# Patient Record
Sex: Female | Born: 1990 | Race: Black or African American | Hispanic: No | Marital: Single | Smoking: Never smoker
Health system: Southern US, Community
[De-identification: ages and names within clinical notes are randomized; demographics above are authoritative.]

## PROBLEM LIST (undated history)

## (undated) DIAGNOSIS — Z9884 Bariatric surgery status: Secondary | ICD-10-CM

## (undated) DIAGNOSIS — F32A Depression, unspecified: Secondary | ICD-10-CM

## (undated) DIAGNOSIS — G4700 Insomnia, unspecified: Secondary | ICD-10-CM

## (undated) DIAGNOSIS — F419 Anxiety disorder, unspecified: Secondary | ICD-10-CM

## (undated) HISTORY — PX: GASTRIC BYPASS: SHX52

## (undated) HISTORY — PX: CHOLECYSTECTOMY: SHX55

---

## 2019-08-11 ENCOUNTER — Inpatient Hospital Stay (HOSPITAL_COMMUNITY): Payer: Self-pay | Admitting: Anesthesiology

## 2019-08-11 ENCOUNTER — Other Ambulatory Visit: Payer: Self-pay

## 2019-08-11 ENCOUNTER — Encounter (HOSPITAL_COMMUNITY): Admission: EM | Disposition: A | Discharge status: Home / Self Care | Payer: Self-pay | Source: Home / Self Care

## 2019-08-11 ENCOUNTER — Inpatient Hospital Stay (HOSPITAL_COMMUNITY)
Admission: EM | Admit: 2019-08-11 | Discharge: 2019-08-15 | DRG: 358 | Disposition: A | Discharge status: Home / Self Care | Payer: Self-pay | Attending: Surgery | Admitting: Surgery

## 2019-08-11 ENCOUNTER — Encounter (HOSPITAL_COMMUNITY): Payer: Self-pay | Admitting: Emergency Medicine

## 2019-08-11 ENCOUNTER — Emergency Department (HOSPITAL_COMMUNITY): Payer: Self-pay

## 2019-08-11 DIAGNOSIS — G8929 Other chronic pain: Secondary | ICD-10-CM | POA: Diagnosis present

## 2019-08-11 DIAGNOSIS — K567 Ileus, unspecified: Secondary | ICD-10-CM

## 2019-08-11 DIAGNOSIS — R1084 Generalized abdominal pain: Secondary | ICD-10-CM

## 2019-08-11 DIAGNOSIS — R109 Unspecified abdominal pain: Principal | ICD-10-CM | POA: Diagnosis present

## 2019-08-11 DIAGNOSIS — R112 Nausea with vomiting, unspecified: Secondary | ICD-10-CM | POA: Diagnosis present

## 2019-08-11 DIAGNOSIS — Z9884 Bariatric surgery status: Secondary | ICD-10-CM

## 2019-08-11 DIAGNOSIS — Z9049 Acquired absence of other specified parts of digestive tract: Secondary | ICD-10-CM

## 2019-08-11 DIAGNOSIS — Z20828 Contact with and (suspected) exposure to other viral communicable diseases: Secondary | ICD-10-CM | POA: Diagnosis present

## 2019-08-11 HISTORY — PX: LAPAROSCOPY: SHX197

## 2019-08-11 LAB — SURGICAL PCR SCREEN
MRSA, PCR: NEGATIVE
Staphylococcus aureus: POSITIVE — AB

## 2019-08-11 LAB — COMPREHENSIVE METABOLIC PANEL
ALT: 12 U/L (ref 0–44)
AST: 16 U/L (ref 15–41)
Albumin: 4.5 g/dL (ref 3.5–5.0)
Alkaline Phosphatase: 77 U/L (ref 38–126)
Anion gap: 18 — ABNORMAL HIGH (ref 5–15)
BUN: <5 mg/dL — ABNORMAL LOW (ref 6–20)
CO2: 18 mmol/L — ABNORMAL LOW (ref 22–32)
Calcium: 10 mg/dL (ref 8.9–10.3)
Chloride: 102 mmol/L (ref 98–111)
Creatinine, Ser: 0.96 mg/dL (ref 0.44–1.00)
GFR calc Af Amer: >60 mL/min (ref >60)
GFR calc non Af Amer: >60 mL/min (ref >60)
Glucose, Bld: 99 mg/dL (ref 70–99)
Potassium: 4 mmol/L (ref 3.5–5.1)
Sodium: 138 mmol/L (ref 135–145)
Total Bilirubin: 1.3 mg/dL — ABNORMAL HIGH (ref 0.3–1.2)
Total Protein: 8 g/dL (ref 6.5–8.1)

## 2019-08-11 LAB — URINALYSIS, ROUTINE W REFLEX MICROSCOPIC
Bilirubin Urine: NEGATIVE
Glucose, UA: NEGATIVE mg/dL
Hgb urine dipstick: NEGATIVE
Ketones, ur: 80 mg/dL — AB
Leukocytes,Ua: NEGATIVE
Nitrite: NEGATIVE
Protein, ur: 100 mg/dL — AB
Specific Gravity, Urine: 1.029 (ref 1.005–1.030)
pH: 5 (ref 5.0–8.0)

## 2019-08-11 LAB — CBC
HCT: 43.9 % (ref 36.0–46.0)
Hemoglobin: 14.9 g/dL (ref 12.0–15.0)
MCH: 35.2 pg — ABNORMAL HIGH (ref 26.0–34.0)
MCHC: 33.9 g/dL (ref 30.0–36.0)
MCV: 103.8 fL — ABNORMAL HIGH (ref 80.0–100.0)
Platelets: 353 10*3/uL (ref 150–400)
RBC: 4.23 MIL/uL (ref 3.87–5.11)
RDW: 12.9 % (ref 11.5–15.5)
WBC: 15.6 10*3/uL — ABNORMAL HIGH (ref 4.0–10.5)
nRBC: 0 % (ref 0.0–0.2)

## 2019-08-11 LAB — ABO/RH: ABO/RH(D): O POS

## 2019-08-11 LAB — LIPASE, BLOOD: Lipase: 22 U/L (ref 11–51)

## 2019-08-11 LAB — RESPIRATORY PANEL BY RT PCR (FLU A&B, COVID)
Influenza A by PCR: NEGATIVE
Influenza B by PCR: NEGATIVE
SARS Coronavirus 2 by RT PCR: NEGATIVE

## 2019-08-11 LAB — LACTIC ACID, PLASMA
Lactic Acid, Venous: 0.8 mmol/L (ref 0.5–1.9)
Lactic Acid, Venous: 0.9 mmol/L (ref 0.5–1.9)

## 2019-08-11 LAB — I-STAT BETA HCG BLOOD, ED (MC, WL, AP ONLY): I-stat hCG, quantitative: <5 m[IU]/mL (ref <5)

## 2019-08-11 LAB — TYPE AND SCREEN
ABO/RH(D): O POS
Antibody Screen: NEGATIVE

## 2019-08-11 SURGERY — LAPAROSCOPY, DIAGNOSTIC
Anesthesia: General | Site: Abdomen

## 2019-08-11 MED ORDER — LACTATED RINGERS IV SOLN: INTRAVENOUS | Status: DC | PRN

## 2019-08-11 MED ORDER — SODIUM CHLORIDE 0.9% FLUSH
3.0000 mL | Freq: Once | INTRAVENOUS | Status: AC
  Administered 2019-08-11: 3 mL via INTRAVENOUS

## 2019-08-11 MED ORDER — FENTANYL CITRATE (PF) 100 MCG/2ML IJ SOLN
INTRAMUSCULAR | Status: DC | PRN
  Administered 2019-08-11: 50 ug via INTRAVENOUS
  Administered 2019-08-11 (×2): 100 ug via INTRAVENOUS
  Administered 2019-08-11 (×5): 50 ug via INTRAVENOUS

## 2019-08-11 MED ORDER — CHLORHEXIDINE GLUCONATE CLOTH 2 % EX PADS: 6.0000 | MEDICATED_PAD | Freq: Once | CUTANEOUS | Status: AC

## 2019-08-11 MED ORDER — MORPHINE SULFATE (PF) 2 MG/ML IV SOLN
1.0000 mg | INTRAVENOUS | Status: DC | PRN
  Administered 2019-08-11 – 2019-08-15 (×15): 1 mg via INTRAVENOUS
  Filled 2019-08-11 (×15): qty 1

## 2019-08-11 MED ORDER — HYDROMORPHONE HCL 1 MG/ML IJ SOLN
0.2500 mg | INTRAMUSCULAR | Status: DC | PRN
  Administered 2019-08-11 (×4): 0.5 mg via INTRAVENOUS

## 2019-08-11 MED ORDER — PROPOFOL 10 MG/ML IV BOLUS
INTRAVENOUS | Status: DC | PRN
  Administered 2019-08-11: 130 mg via INTRAVENOUS

## 2019-08-11 MED ORDER — ONDANSETRON HCL 4 MG/2ML IJ SOLN
4.0000 mg | Freq: Once | INTRAMUSCULAR | Status: AC
  Administered 2019-08-11: 4 mg via INTRAVENOUS
  Filled 2019-08-11: qty 2

## 2019-08-11 MED ORDER — CHLORHEXIDINE GLUCONATE CLOTH 2 % EX PADS
6.0000 | MEDICATED_PAD | Freq: Once | CUTANEOUS | Status: AC
  Administered 2019-08-11: 6 via TOPICAL

## 2019-08-11 MED ORDER — BUPIVACAINE-EPINEPHRINE 0.5% -1:200000 IJ SOLN
INTRAMUSCULAR | Status: AC
  Filled 2019-08-11: qty 1

## 2019-08-11 MED ORDER — FENTANYL CITRATE (PF) 250 MCG/5ML IJ SOLN
INTRAMUSCULAR | Status: AC
  Filled 2019-08-11: qty 5

## 2019-08-11 MED ORDER — LACTATED RINGERS IV SOLN: INTRAVENOUS | Status: DC

## 2019-08-11 MED ORDER — HYDROMORPHONE HCL 1 MG/ML IJ SOLN
INTRAMUSCULAR | Status: AC
  Filled 2019-08-11: qty 1

## 2019-08-11 MED ORDER — ENOXAPARIN SODIUM 40 MG/0.4ML ~~LOC~~ SOLN
40.0000 mg | SUBCUTANEOUS | Status: DC
  Administered 2019-08-12 – 2019-08-14 (×3): 40 mg via SUBCUTANEOUS
  Filled 2019-08-11 (×3): qty 0.4

## 2019-08-11 MED ORDER — ROCURONIUM BROMIDE 100 MG/10ML IV SOLN
INTRAVENOUS | Status: DC | PRN
  Administered 2019-08-11: 30 mg via INTRAVENOUS
  Administered 2019-08-11: 20 mg via INTRAVENOUS

## 2019-08-11 MED ORDER — PREDNISONE 20 MG PO TABS
20.0000 mg | ORAL_TABLET | Freq: Four times a day (QID) | ORAL | Status: DC | PRN
  Administered 2019-08-12: 20 mg via ORAL
  Filled 2019-08-11: qty 1

## 2019-08-11 MED ORDER — MIDAZOLAM HCL 5 MG/5ML IJ SOLN
INTRAMUSCULAR | Status: DC | PRN
  Administered 2019-08-11: 2 mg via INTRAVENOUS

## 2019-08-11 MED ORDER — ACETAMINOPHEN 650 MG RE SUPP: 650.0000 mg | Freq: Four times a day (QID) | RECTAL | Status: DC | PRN

## 2019-08-11 MED ORDER — SODIUM CHLORIDE 0.9 % IV SOLN
2.0000 g | INTRAVENOUS | Status: AC
  Administered 2019-08-11: 2 g via INTRAVENOUS
  Filled 2019-08-11 (×2): qty 2

## 2019-08-11 MED ORDER — IOHEXOL 300 MG/ML  SOLN
100.0000 mL | Freq: Once | INTRAMUSCULAR | Status: AC | PRN
  Administered 2019-08-11: 100 mL via INTRAVENOUS

## 2019-08-11 MED ORDER — DEXAMETHASONE SODIUM PHOSPHATE 10 MG/ML IJ SOLN
INTRAMUSCULAR | Status: DC | PRN
  Administered 2019-08-11: 10 mg via INTRAVENOUS

## 2019-08-11 MED ORDER — 0.9 % SODIUM CHLORIDE (POUR BTL) OPTIME
TOPICAL | Status: DC | PRN
  Administered 2019-08-11: 1000 mL

## 2019-08-11 MED ORDER — MORPHINE SULFATE (PF) 4 MG/ML IV SOLN
4.0000 mg | Freq: Once | INTRAVENOUS | Status: AC
  Administered 2019-08-11: 4 mg via INTRAVENOUS
  Filled 2019-08-11: qty 1

## 2019-08-11 MED ORDER — SODIUM CHLORIDE 0.9 % IV BOLUS
1000.0000 mL | Freq: Once | INTRAVENOUS | Status: AC
  Administered 2019-08-11: 1000 mL via INTRAVENOUS

## 2019-08-11 MED ORDER — ACETAMINOPHEN 325 MG PO TABS
650.0000 mg | ORAL_TABLET | Freq: Four times a day (QID) | ORAL | Status: DC | PRN
  Administered 2019-08-12: 650 mg via ORAL
  Filled 2019-08-11: qty 2

## 2019-08-11 MED ORDER — PROPOFOL 10 MG/ML IV BOLUS
INTRAVENOUS | Status: AC
  Filled 2019-08-11: qty 20

## 2019-08-11 MED ORDER — SODIUM CHLORIDE 0.9 % IV SOLN: INTRAVENOUS | Status: DC

## 2019-08-11 MED ORDER — ONDANSETRON HCL 4 MG/2ML IJ SOLN
4.0000 mg | Freq: Four times a day (QID) | INTRAMUSCULAR | Status: DC | PRN
  Administered 2019-08-11 – 2019-08-15 (×9): 4 mg via INTRAVENOUS
  Filled 2019-08-11 (×10): qty 2

## 2019-08-11 MED ORDER — ONDANSETRON HCL 4 MG/2ML IJ SOLN
INTRAMUSCULAR | Status: DC | PRN
  Administered 2019-08-11: 4 mg via INTRAVENOUS

## 2019-08-11 MED ORDER — OXYCODONE-ACETAMINOPHEN 5-325 MG PO TABS
1.0000 | ORAL_TABLET | ORAL | Status: DC | PRN
  Administered 2019-08-12 – 2019-08-15 (×6): 1 via ORAL
  Filled 2019-08-11 (×6): qty 1

## 2019-08-11 MED ORDER — SUCCINYLCHOLINE CHLORIDE 20 MG/ML IJ SOLN
INTRAMUSCULAR | Status: DC | PRN
  Administered 2019-08-11: 80 mg via INTRAVENOUS

## 2019-08-11 MED ORDER — STERILE WATER FOR IRRIGATION IR SOLN
Status: DC | PRN
  Administered 2019-08-11: 1000 mL

## 2019-08-11 MED ORDER — ROCURONIUM BROMIDE 10 MG/ML (PF) SYRINGE
PREFILLED_SYRINGE | INTRAVENOUS | Status: AC
  Filled 2019-08-11: qty 10

## 2019-08-11 MED ORDER — SUCRALFATE 1 GM/10ML PO SUSP
1.0000 g | Freq: Three times a day (TID) | ORAL | Status: DC
  Administered 2019-08-11 – 2019-08-15 (×15): 1 g via ORAL
  Filled 2019-08-11 (×15): qty 10

## 2019-08-11 MED ORDER — PANTOPRAZOLE SODIUM 40 MG PO TBEC
40.0000 mg | DELAYED_RELEASE_TABLET | Freq: Every day | ORAL | Status: DC
  Administered 2019-08-12 – 2019-08-15 (×4): 40 mg via ORAL
  Filled 2019-08-11 (×4): qty 1

## 2019-08-11 MED ORDER — ONDANSETRON 4 MG PO TBDP: 4.0000 mg | ORAL_TABLET | Freq: Four times a day (QID) | ORAL | Status: DC | PRN

## 2019-08-11 MED ORDER — BUPIVACAINE-EPINEPHRINE 0.5% -1:200000 IJ SOLN
INTRAMUSCULAR | Status: DC | PRN
  Administered 2019-08-11: 50 mL

## 2019-08-11 MED ORDER — DIPHENHYDRAMINE HCL 50 MG/ML IJ SOLN
INTRAMUSCULAR | Status: AC
  Filled 2019-08-11: qty 1

## 2019-08-11 MED ORDER — MIDAZOLAM HCL 2 MG/2ML IJ SOLN
INTRAMUSCULAR | Status: AC
  Filled 2019-08-11: qty 2

## 2019-08-11 MED ORDER — DIPHENHYDRAMINE HCL 50 MG/ML IJ SOLN
12.5000 mg | Freq: Once | INTRAMUSCULAR | Status: AC
  Administered 2019-08-11: 12.5 mg via INTRAVENOUS

## 2019-08-11 SURGICAL SUPPLY — 60 items
BLADE CLIPPER SURG (BLADE) IMPLANT
CANISTER SUCT 3000ML PPV (MISCELLANEOUS) ×4 IMPLANT
CHLORAPREP W/TINT 26 (MISCELLANEOUS) ×4 IMPLANT
COVER SURGICAL LIGHT HANDLE (MISCELLANEOUS) ×4 IMPLANT
COVER WAND RF STERILE (DRAPES) ×2 IMPLANT
DECANTER SPIKE VIAL GLASS SM (MISCELLANEOUS) ×2 IMPLANT
DERMABOND ADVANCED (GAUZE/BANDAGES/DRESSINGS) ×2
DERMABOND ADVANCED .7 DNX12 (GAUZE/BANDAGES/DRESSINGS) ×2 IMPLANT
DRAPE LAPAROSCOPIC ABDOMINAL (DRAPES) ×2 IMPLANT
DRAPE WARM FLUID 44X44 (DRAPES) ×2 IMPLANT
DRSG OPSITE POSTOP 4X10 (GAUZE/BANDAGES/DRESSINGS) IMPLANT
DRSG OPSITE POSTOP 4X8 (GAUZE/BANDAGES/DRESSINGS) IMPLANT
ELECT BLADE 6.5 EXT (BLADE) IMPLANT
ELECT CAUTERY BLADE 6.4 (BLADE) ×2 IMPLANT
ELECT REM PT RETURN 9FT ADLT (ELECTROSURGICAL) ×4
ELECTRODE REM PT RTRN 9FT ADLT (ELECTROSURGICAL) ×2 IMPLANT
GLOVE BIO SURGEON STRL SZ7 (GLOVE) ×2 IMPLANT
GLOVE BIO SURGEON STRL SZ8 (GLOVE) ×4 IMPLANT
GLOVE BIOGEL PI IND STRL 6.5 (GLOVE) IMPLANT
GLOVE BIOGEL PI IND STRL 7.0 (GLOVE) IMPLANT
GLOVE BIOGEL PI IND STRL 8 (GLOVE) ×2 IMPLANT
GLOVE BIOGEL PI INDICATOR 6.5 (GLOVE) ×2
GLOVE BIOGEL PI INDICATOR 7.0 (GLOVE) ×4
GLOVE BIOGEL PI INDICATOR 8 (GLOVE) ×6
GLOVE SURG SS PI 6.5 STRL IVOR (GLOVE) ×2 IMPLANT
GOWN STRL REUS W/ TWL LRG LVL3 (GOWN DISPOSABLE) ×4 IMPLANT
GOWN STRL REUS W/ TWL XL LVL3 (GOWN DISPOSABLE) ×2 IMPLANT
GOWN STRL REUS W/TWL LRG LVL3 (GOWN DISPOSABLE) ×4
GOWN STRL REUS W/TWL XL LVL3 (GOWN DISPOSABLE) ×2
HANDLE SUCTION POOLE (INSTRUMENTS) ×2 IMPLANT
KIT BASIN OR (CUSTOM PROCEDURE TRAY) ×4 IMPLANT
KIT TURNOVER KIT B (KITS) ×6 IMPLANT
LIGASURE IMPACT 36 18CM CVD LR (INSTRUMENTS) IMPLANT
NS IRRIG 1000ML POUR BTL (IV SOLUTION) ×6 IMPLANT
PACK GENERAL/GYN (CUSTOM PROCEDURE TRAY) ×2 IMPLANT
PAD ARMBOARD 7.5X6 YLW CONV (MISCELLANEOUS) ×4 IMPLANT
PENCIL SMOKE EVACUATOR (MISCELLANEOUS) ×2 IMPLANT
SCISSORS LAP 5X35 DISP (ENDOMECHANICALS) ×2 IMPLANT
SET IRRIG TUBING LAPAROSCOPIC (IRRIGATION / IRRIGATOR) ×2 IMPLANT
SET TUBE SMOKE EVAC HIGH FLOW (TUBING) ×4 IMPLANT
SHEARS HARMONIC ACE PLUS 36CM (ENDOMECHANICALS) IMPLANT
SLEEVE ENDOPATH XCEL 5M (ENDOMECHANICALS) ×6 IMPLANT
SPECIMEN JAR LARGE (MISCELLANEOUS) IMPLANT
SPONGE LAP 18X18 RF (DISPOSABLE) IMPLANT
STAPLER VISISTAT 35W (STAPLE) ×2 IMPLANT
SUCTION POOLE HANDLE (INSTRUMENTS)
SUT MNCRL AB 4-0 PS2 18 (SUTURE) ×4 IMPLANT
SUT PDS AB 1 TP1 96 (SUTURE) ×4 IMPLANT
SUT VIC AB 2-0 SH 18 (SUTURE) ×2 IMPLANT
SUT VIC AB 3-0 SH 18 (SUTURE) ×2 IMPLANT
SUT VICRYL AB 2 0 TIES (SUTURE) ×2 IMPLANT
SUT VICRYL AB 3 0 TIES (SUTURE) ×2 IMPLANT
TOWEL GREEN STERILE (TOWEL DISPOSABLE) ×2 IMPLANT
TOWEL GREEN STERILE FF (TOWEL DISPOSABLE) ×6 IMPLANT
TRAY FOLEY MTR SLVR 16FR STAT (SET/KITS/TRAYS/PACK) ×2 IMPLANT
TRAY LAPAROSCOPIC MC (CUSTOM PROCEDURE TRAY) ×4 IMPLANT
TROCAR XCEL BLUNT TIP 100MML (ENDOMECHANICALS) ×2 IMPLANT
TROCAR XCEL NON-BLD 11X100MML (ENDOMECHANICALS) IMPLANT
TROCAR XCEL NON-BLD 5MMX100MML (ENDOMECHANICALS) ×4 IMPLANT
YANKAUER SUCT BULB TIP NO VENT (SUCTIONS) IMPLANT

## 2019-08-11 NOTE — H&P (Signed)
Marie Myers Apr 05, 1991  [id number].    Requesting MD: Doug SouKaitlyn Albrizze, PA-C Chief Complaint/Reason for Consult: Abdominal pain, n/v  HPI: Marie LevinsOceana Thelin is a 28 y.o. female with a prior abdominal history of cholecystectomy, cesarean and gastric bypass (both performed in [place]) who presented to [facility] today for abdominal pain, nausea and emesis. Patient reports over the last 3 days she has had generalized, constant, aching abdominal pain with associated nausea and emesis. She has had ~20 episodes of nonbilious, nonbloody emesis over the last 24 hours. Her last BM was this morning and was diarrhea. She is not passing flatus. She had an episode of intractable n/v once after her surgery but it resolved and no clear etiology found. She has had serial dilations of GJ anastomosis, at one pointm was going in for this every other week. Last dilation and EGD was 2 years ago. Patient reports hiatal hernia was noted on prior EGD but nothing done about that. Patient was 350 lbs prior to bypass and is now 190 lbs. Has not had any issues in the last year since gallbladder removal. Work-up in the ED revealed WBC 15.6. Afebrile. No tachycardia or hypotension. Lactic acid pending. CT with bypass portion of the stomach mildly distended with fluid of unknown etiology. General surgery was asked to see.  ROS: Review of Systems  Constitutional: Negative for chills and fever.  Respiratory: Negative for cough and shortness of breath.   Cardiovascular: Negative for chest pain.  Gastrointestinal: Positive for abdominal pain, constipation, nausea and vomiting. Negative for diarrhea.  Genitourinary: Negative for dysuria and urgency.  All other systems reviewed and are negative.   No family history on file.  History reviewed. No pertinent past medical history.  Past Surgical History:  Procedure Laterality Date   GASTRIC BYPASS      Social History:  has no history on file for tobacco, alcohol, and  drug.  Allergies: No Known Allergies  (Not in a hospital admission)    Physical Exam: Blood pressure (!) 161/95, pulse 65, temperature 98.5 F (36.9 C), temperature source Oral, resp. rate 15, SpO2 100 %. General: pleasant, WD, WN female who is laying in bed in NAD HEENT: head is normocephalic, atraumatic.  Sclera are noninjected.  PERRL.  Ears and nose without any masses or lesions.  Mouth is dry Heart: regular, rate, and rhythm.  Normal s1,s2. No obvious murmurs, gallops, or rubs noted.  Palpable radial and pedal pulses bilaterally Lungs: CTAB, no wheezes, rhonchi, or rales noted.  Respiratory effort nonlabored Abd: Soft, ND, generalized tenderness without rebound, rigidity, guarding. +BS, no masses, hernias, or organomegaly.  MS: all 4 extremities are symmetrical with no cyanosis, clubbing, or edema. Skin: warm and dry with no masses, lesions, or rashes Psych: A&Ox3 with an appropriate affect. Neuro: Moves all extremities  Results for orders placed or performed during the hospital encounter of 08/11/19 (from the past 48 hour(s))  Lipase, blood     Status: None   Collection Time: 08/11/19 11:46 AM  Result Value Ref Range   Lipase 22 11 - 51 U/L    Comment: Performed at [facility], [address] [place], [place] [address]  Comprehensive metabolic panel     Status: Abnormal   Collection Time: 08/11/19 11:46 AM  Result Value Ref Range   Sodium 138 135 - 145 mmol/L   Potassium 4.0 3.5 - 5.1 mmol/L   Chloride 102 98 - 111 mmol/L   CO2 18 (L) 22 - 32  mmol/L   Glucose, Bld 99 70 - 99 mg/dL   BUN <5 (L) 6 - 20 mg/dL   Creatinine, Ser 3.61 0.44 - 1.00 mg/dL   Calcium 44.3 8.9 - 15.4 mg/dL   Total Protein 8.0 6.5 - 8.1 g/dL   Albumin 4.5 3.5 - 5.0 g/dL   AST 16 15 - 41 U/L   ALT 12 0 - 44 U/L   Alkaline Phosphatase 77 38 - 126 U/L   Total Bilirubin 1.3 (H) 0.3 - 1.2 mg/dL   GFR calc non Af Amer >60 >60 mL/min   GFR calc Af Amer >60 >60 mL/min   Anion gap 18 (H) 5 - 15     Comment: Performed at [facility], [address], [place], [place] [address]  CBC     Status: Abnormal   Collection Time: 08/11/19 11:46 AM  Result Value Ref Range   WBC 15.6 (H) 4.0 - 10.5 K/uL   RBC 4.23 3.87 - 5.11 MIL/uL   Hemoglobin 14.9 12.0 - 15.0 g/dL   HCT 61.9 50.9 - 32.6 %   MCV 103.8 (H) 80.0 - 100.0 fL   MCH 35.2 (H) 26.0 - 34.0 pg   MCHC 33.9 30.0 - 36.0 g/dL   RDW 71.2 45.8 - 09.9 %   Platelets 353 150 - 400 K/uL   nRBC 0.0 0.0 - 0.2 %    Comment: Performed at [facility], [address] [place], [place] [address]  I-Stat beta hCG blood, ED     Status: None   Collection Time: 08/11/19 12:19 PM  Result Value Ref Range   I-stat hCG, quantitative <5.0 <5 mIU/mL   Comment 3            Comment:   GEST. AGE      CONC.  (mIU/mL)   <=1 WEEK        5 - 50     2 WEEKS       50 - 500     3 WEEKS       100 - 10,000     4 WEEKS     1,000 - 30,000        FEMALE AND NON-PREGNANT FEMALE:     LESS THAN 5 mIU/mL   Urinalysis, Routine w reflex microscopic     Status: Abnormal   Collection Time: 08/11/19  1:35 PM  Result Value Ref Range   Color, Urine YELLOW YELLOW   APPearance HAZY (A) CLEAR   Specific Gravity, Urine 1.029 1.005 - 1.030   pH 5.0 5.0 - 8.0   Glucose, UA NEGATIVE NEGATIVE mg/dL   Hgb urine dipstick NEGATIVE NEGATIVE   Bilirubin Urine NEGATIVE NEGATIVE   Ketones, ur 80 (A) NEGATIVE mg/dL   Protein, ur 505 (A) NEGATIVE mg/dL   Nitrite NEGATIVE NEGATIVE   Leukocytes,Ua NEGATIVE NEGATIVE   RBC / HPF 0-5 0 - 5 RBC/hpf   WBC, UA 0-5 0 - 5 WBC/hpf   Bacteria, UA RARE (A) NONE SEEN   Squamous Epithelial / LPF 11-20 0 - 5   Mucus PRESENT     Comment: Performed at [facility], [address] [place], [place] [address]   CT ABDOMEN PELVIS W CONTRAST  Result Date: 08/11/2019 CLINICAL DATA:  Severe abdominal pain.  History of gastric bypass. EXAM: CT ABDOMEN AND PELVIS WITH CONTRAST TECHNIQUE: Multidetector CT imaging of the abdomen and  pelvis was performed using the standard protocol following bolus administration of intravenous contrast.  CONTRAST:  147mL OMNIPAQUE IOHEXOL 300 MG/ML  SOLN COMPARISON:  None. FINDINGS: Lower chest: Lung bases are clear. No effusions. Heart is normal size. Hepatobiliary: Diffuse low-density throughout the liver compatible with fatty infiltration. No focal abnormality. Gallbladder not visualized, presumed prior cholecystectomy Pancreas: No focal abnormality or ductal dilatation. Spleen: No focal abnormality.  Normal size. Adrenals/Urinary Tract: No adrenal abnormality. No focal renal abnormality. No stones or hydronephrosis. Urinary bladder is unremarkable. Stomach/Bowel: Postoperative changes of prior gastric bypass. The bypassed portion of the stomach is mildly distended with fluid. No evidence of bowel obstruction. Small bowel and large bowel unremarkable. Vascular/Lymphatic: No evidence of aneurysm or adenopathy. Reproductive: Uterus and adnexa unremarkable.  No mass. Other: No free fluid or free air. Musculoskeletal: No acute bony abnormality. IMPRESSION: Prior gastric bypass. The bypassed portion of the stomach mildly distended with fluid of unknown etiology or significance. Fatty infiltration of the liver. No acute findings. Electronically Signed   By: Rolm Baptise M.D.   On: 08/11/2019 16:08   Anti-infectives (From admission, onward)   None     Assessment/Plan Hx of gastric bypass Abdominal pain, n/v  - CT scan with bypassed portion of the stomach mildly distended with fluid of unknown etiology or significance - abdominal exam concerning with generalized tenderness, but not peritonitic  - Check lactic acid - Admit to observation, keep NPO - patient may ultimately end up requiring dx laparoscopy later this evening - serial abdominal exam  Brigid Re , [facility] Surgery 08/11/2019, 4:54 PM Please see Amion for pager number during day hours 7:00am-4:30pm

## 2019-08-11 NOTE — Anesthesia Postprocedure Evaluation (Signed)
Anesthesia Post Note  Patient: Marie Myers  Procedure(s) Performed: LAPAROSCOPY DIAGNOSTIC (N/A Abdomen)     Patient location during evaluation: PACU Anesthesia Type: General Level of consciousness: awake and alert Pain management: pain level controlled Vital Signs Assessment: post-procedure vital signs reviewed and stable Respiratory status: spontaneous breathing, nonlabored ventilation and respiratory function stable Cardiovascular status: blood pressure returned to baseline and stable Postop Assessment: no apparent nausea or vomiting Anesthetic complications: no    Last Vitals:  Vitals:   08/11/19 2230 08/11/19 2244  BP: (!) 146/85 (!) 147/84  Pulse: 84 90  Resp: 20 16  Temp: 37.2 C 36.9 C  SpO2: 98% 99%    Last Pain:  Vitals:   08/11/19 2244  TempSrc: Oral  PainSc:                  Josiah Wojtaszek,W. EDMOND

## 2019-08-11 NOTE — ED Notes (Signed)
Attempted to help pt to restroom for urine sample. Pt began feeling lightheaded on way to restroom. Pt helped into hospital bed and taken back to room.

## 2019-08-11 NOTE — Op Note (Signed)
Preoperative diagnosis: History of Roux-en-Y gastric bypass with abdominal pain, nausea and vomiting for 2 days  Postoperative diagnosis: Same   normal anatomy of Roux-en-Y gastric bypass without signs of obstruction, internal hernia or ischemia.  Mild dilatation of gastric remnant and biliopancreatic limb down to the jejunojejunostomy.  Procedure: Diagnostic laparoscopy  Surgeon: Erroll Luna, MD  Anesthesia: General with 0.5% Marcaine plain  EBL: Minimal  Specimen: None  Drains: None  Indications for procedure the patient is a 28 year old female who is 3 years status post Roux-en-Y gastric bypass in [place].  She has had problems on and off since her surgery with nausea, vomiting and intermittent abdominal pain.  The pain worsened over the last 48 hours it became severe.  She presented to the emergency room today.  She had a white count of 16,000 but normal lactate.  CT scan showed dilatation of her gastric remnant but no other significant findings.  On exam she had significant pain to palpation.  I was concerned about a possible internal hernia and recommended diagnostic laparoscopy given her past history.  Risk, benefits and other options in the work-up were discussed with the patient.  She agreed to proceed.The procedure has been discussed with the patient.  Alternative therapies have been discussed with the patient.  Operative risks include bleeding,  Infection,  Organ injury,  Nerve injury,  Blood vessel injury,  DVT,  Pulmonary embolism,  Death,  And possible reoperation.  Medical management risks include worsening of present situation.  The success of the procedure is 50 -90 % at treating patients symptoms.  The patient understands and agrees to proceed.  Description of procedure: The patient was met in the holding area and questions were answered.  She was taken the operating.  She is placed supine upon the OR table.  After induction of general seizure a Foley catheter was placed  under sterile conditions and she was prepped and draped in sterile fashion.  She received 2 g of cefotetan.  Timeout was performed.  A 1 cm infraumbilical incision was made.  This was done with local anesthetic.  Dissection was carried down to her midline and the fascia was opened and the abdominal cavity was entered under direct vision.  A 12 mm Hassan cannula was placed and pneumoperitoneum was created to 15 mmHg of CO2 pressure.  Laparoscopy was performed.  I then placed an additional right lower quadrant 5 mm port, a left lower quadrant 5 mm port, and a right upper quadrant 5 mm port.  This gave me good visualization of the entire abdominal cavity.  I found her gastric pouch and she had an antecolic gastric bypass.  I followed this down from the gastric pouch.  I then found the cecum.  I found the terminal ileum around the small intestine from the terminal ileum all the way up to the jejunojejunostomy.  I then followed the bypass limb up to the gastric remnant.  There is no signs of twisting, obstruction or ischemia.  There is no internal hernia.  At the jejunojejunostomy, there is dilation of the biliopancreatic limb.  This was mild to moderate.  There is no signs of ischemia.  This dilatation stop to the anastomosis raising suspicion for a possible low-grade stenotic issue with the anastomosis.  She had months of chronic pain with nausea and vomiting this could be potential source.  He was not severe and I did not intervene given the mild dilation noted.  Her stomach was normal otherwise.  Her  liver was normal.  The gallbladder was absent.  Her appendix was visualized and was normal.  Pelvic organs were grossly normal.  There is no injury to the bowel with the manipulation.  Spleen appeared normal.  After careful inspection I inspected the rectum, sigmoid colon, descending colon, transverse colon, and ascending colon and saw no abnormality.  At this point time I did not see any complicating feature of her  gastric bypass or obvious source of her abdominal pain.  I elected to remove the ports allowed the CO2 to escape.  The umbilical port site was closed with pursestring suture of 0 Vicryl.  4-0 Monocryl used to close skin incisions.  Dermabond applied.  Foley catheter removed.  All counts were found to be  correct.  Patient was then awoke extubated taken to recovery in satisfactory condition.

## 2019-08-11 NOTE — ED Triage Notes (Signed)
Pt reports hx of gastric bypass, states she began having abd pain and vomiting 3 days. Pt tearful in triage. Denies fevers, or diarrhea.

## 2019-08-11 NOTE — Anesthesia Procedure Notes (Signed)
Procedure Name: Intubation Date/Time: 08/11/2019 8:11 PM Performed by: Purvis Kilts, CRNA Pre-anesthesia Checklist: Patient identified, Emergency Drugs available, Suction available, Patient being monitored and Timeout performed Patient Re-evaluated:Patient Re-evaluated prior to induction Oxygen Delivery Method: Circle system utilized Preoxygenation: Pre-oxygenation with 100% oxygen Induction Type: IV induction Ventilation: Mask ventilation without difficulty Laryngoscope Size: Mac and 3 Grade View: Grade I Tube type: Oral Tube size: 7.0 mm Number of attempts: 1 Airway Equipment and Method: Stylet Placement Confirmation: ETT inserted through vocal cords under direct vision,  positive ETCO2 and breath sounds checked- equal and bilateral Secured at: 21 cm Tube secured with: Tape Dental Injury: Teeth and Oropharynx as per pre-operative assessment

## 2019-08-11 NOTE — ED Notes (Addendum)
ED TO INPATIENT HANDOFF REPORT  ED Nurse Name and Phone #: Alycia RossettiRyan, [phone]  S Name/Age/Gender Marie Myers 28 y.o. female Room/Bed: 004C/004C  Code Status   Code Status: Full Code  Home/SNF/Other Home Patient oriented to: self, place, time and situation Is this baseline? Yes   Triage Complete: Triage complete  Chief Complaint Abdominal pain [R10.9]  Triage Note Pt reports hx of gastric bypass, states she began having abd pain and vomiting 3 days. Pt tearful in triage. Denies fevers, or diarrhea.     Allergies No Known Allergies  Level of Care/Admitting Diagnosis ED Disposition    ED Disposition Condition Comment   Admit  [facility]ospital [facility] [[id number]]  Level of Care: Med-Surg [16]  Covid Evaluation: Asymptomatic Screening Protocol (No Symptoms)  Diagnosis: Abdominal pain [454098][[id number]]  Admitting Physician: CCS, MD [[id number]]  Attending Physician: CCS, MD [[id number]]  Estimated length of stay: past midnight tomorrow  Certification:: I certify this patient will need inpatient services for at least 2 midnights       B Medical/Surgery History History reviewed. No pertinent past medical history. Past Surgical History:  Procedure Laterality Date  . GASTRIC BYPASS       A IV Location/Drains/Wounds Patient Lines/Drains/Airways Status   Active Line/Drains/Airways    Name:   Placement date:   Placement time:   Site:   Days:   Peripheral IV 08/11/19 Right Antecubital   08/11/19    1326    Antecubital   less than 1          Intake/Output Last 24 hours  Intake/Output Summary (Last 24 hours) at 08/11/2019 1735 Last data filed at 08/11/2019 1710 Gross per 24 hour  Intake 1000 ml  Output --  Net 1000 ml    Labs/Imaging Results for orders placed or performed during the hospital encounter of 08/11/19 (from the past 48 hour(s))  Lipase, blood     Status: None   Collection Time: 08/11/19 11:46 AM  Result Value Ref Range   Lipase 22 11 - 51 U/L     Comment: Performed at [facility], [address] [place], [place] [address]  Comprehensive metabolic panel     Status: Abnormal   Collection Time: 08/11/19 11:46 AM  Result Value Ref Range   Sodium 138 135 - 145 mmol/L   Potassium 4.0 3.5 - 5.1 mmol/L   Chloride 102 98 - 111 mmol/L   CO2 18 (L) 22 - 32 mmol/L   Glucose, Bld 99 70 - 99 mg/dL   BUN <5 (L) 6 - 20 mg/dL   Creatinine, Ser 7.820.96 0.44 - 1.00 mg/dL   Calcium 95.610.0 8.9 - 21.310.3 mg/dL   Total Protein 8.0 6.5 - 8.1 g/dL   Albumin 4.5 3.5 - 5.0 g/dL   AST 16 15 - 41 U/L   ALT 12 0 - 44 U/L   Alkaline Phosphatase 77 38 - 126 U/L   Total Bilirubin 1.3 (H) 0.3 - 1.2 mg/dL   GFR calc non Af Amer >60 >60 mL/min   GFR calc Af Amer >60 >60 mL/min   Anion gap 18 (H) 5 - 15    Comment: Performed at [facility], [address] [place], [place] [address]  CBC     Status: Abnormal   Collection Time: 08/11/19 11:46 AM  Result Value Ref Range   WBC 15.6 (H) 4.0 - 10.5 K/uL   RBC 4.23 3.87 - 5.11 MIL/uL   Hemoglobin 14.9 12.0 - 15.0  g/dL   HCT 51.7 61.6 - 07.3 %   MCV 103.8 (H) 80.0 - 100.0 fL   MCH 35.2 (H) 26.0 - 34.0 pg   MCHC 33.9 30.0 - 36.0 g/dL   RDW 71.0 62.6 - 94.8 %   Platelets 353 150 - 400 K/uL   nRBC 0.0 0.0 - 0.2 %    Comment: Performed at [facility], [address] [place], [place] [address]  I-Stat beta hCG blood, ED     Status: None   Collection Time: 08/11/19 12:19 PM  Result Value Ref Range   I-stat hCG, quantitative <5.0 <5 mIU/mL   Comment 3            Comment:   GEST. AGE      CONC.  (mIU/mL)   <=1 WEEK        5 - 50     2 WEEKS       50 - 500     3 WEEKS       100 - 10,000     4 WEEKS     1,000 - 30,000        FEMALE AND NON-PREGNANT FEMALE:     LESS THAN 5 mIU/mL   Urinalysis, Routine w reflex microscopic     Status: Abnormal   Collection Time: 08/11/19  1:35 PM  Result Value Ref Range   Color, Urine YELLOW YELLOW   APPearance HAZY (A) CLEAR   Specific Gravity, Urine 1.029 1.005  - 1.030   pH 5.0 5.0 - 8.0   Glucose, UA NEGATIVE NEGATIVE mg/dL   Hgb urine dipstick NEGATIVE NEGATIVE   Bilirubin Urine NEGATIVE NEGATIVE   Ketones, ur 80 (A) NEGATIVE mg/dL   Protein, ur 035 (A) NEGATIVE mg/dL   Nitrite NEGATIVE NEGATIVE   Leukocytes,Ua NEGATIVE NEGATIVE   RBC / HPF 0-5 0 - 5 RBC/hpf   WBC, UA 0-5 0 - 5 WBC/hpf   Bacteria, UA RARE (A) NONE SEEN   Squamous Epithelial / LPF 11-20 0 - 5   Mucus PRESENT     Comment: Performed at [facility], [address], [place], [place] [address]   CT ABDOMEN PELVIS W CONTRAST  Result Date: 08/11/2019 CLINICAL DATA:  Severe abdominal pain.  History of gastric bypass. EXAM: CT ABDOMEN AND PELVIS WITH CONTRAST TECHNIQUE: Multidetector CT imaging of the abdomen and pelvis was performed using the standard protocol following bolus administration of intravenous contrast. CONTRAST:  OMNIPAQUE IOHEXOL 300 MG/ML  SOLN COMPARISON:  None. FINDINGS: Lower chest: Lung bases are clear. No effusions. Heart is normal size. Hepatobiliary: Diffuse low-density throughout the liver compatible with fatty infiltration. No focal abnormality. Gallbladder not visualized, presumed prior cholecystectomy Pancreas: No focal abnormality or ductal dilatation. Spleen: No focal abnormality.  Normal size. Adrenals/Urinary Tract: No adrenal abnormality. No focal renal abnormality. No stones or hydronephrosis. Urinary bladder is unremarkable. Stomach/Bowel: Postoperative changes of prior gastric bypass. The bypassed portion of the stomach is mildly distended with fluid. No evidence of bowel obstruction. Small bowel and large bowel unremarkable. Vascular/Lymphatic: No evidence of aneurysm or adenopathy. Reproductive: Uterus and adnexa unremarkable.  No mass. Other: No free fluid or free air. Musculoskeletal: No acute bony abnormality. IMPRESSION: Prior gastric bypass. The bypassed portion of the stomach mildly distended with fluid of unknown etiology or significance.  Fatty infiltration of the liver. No acute findings. Electronically Signed   By: Charlett Nose M.D.   On: 08/11/2019 16:08    Pending Labs  Unresulted Labs (From admission, onward)    Start     Ordered   08/18/19 0500  Creatinine, serum  (enoxaparin (LOVENOX)    CrCl >/= 30 ml/min)  Weekly,   R    Comments: while on enoxaparin therapy    08/11/19 1700   08/12/19 0500  HIV Antibody (routine testing w rflx)  (HIV Antibody (Routine testing w reflex) panel)  Tomorrow morning,   R     08/11/19 1700   08/12/19 1751  Basic metabolic panel  Tomorrow morning,   R     08/11/19 1700   08/12/19 0500  CBC  Tomorrow morning,   R     08/11/19 1700   08/11/19 1650  Respiratory Panel by RT PCR (Flu A&B, Covid) - Nasopharyngeal Swab  (Tier 2 Respiratory Panel by RT PCR (Flu A&B, Covid) (TAT 2 hrs))  Once,   STAT    Question Answer Comment  Is this test for diagnosis or screening Screening   Symptomatic for COVID-19 as defined by CDC No   Hospitalized for COVID-19 No   Admitted to ICU for COVID-19 No   Previously tested for COVID-19 No   Resident in a congregate (group) care setting No   Employed in healthcare setting No   Pregnant No      08/11/19 1649   08/11/19 1621  Lactic acid, plasma  STAT Now then every 3 hours,   R     08/11/19 1620          Vitals/Pain Today's Vitals   08/11/19 1141 08/11/19 1143  BP:  (!) 161/95  Pulse:  65  Resp:  15  Temp:  98.5 F (36.9 C)  TempSrc:  Oral  SpO2:  100%  PainSc: 10-Worst pain ever     Isolation Precautions No active isolations  Medications Medications  enoxaparin (LOVENOX) injection 40 mg (has no administration in time range)  0.9 %  sodium chloride infusion (has no administration in time range)  acetaminophen (TYLENOL) tablet 650 mg (has no administration in time range)    Or  acetaminophen (TYLENOL) suppository 650 mg (has no administration in time range)  morphine 2 MG/ML injection 1 mg (has no administration in time range)   ondansetron (ZOFRAN-ODT) disintegrating tablet 4 mg (has no administration in time range)    Or  ondansetron (ZOFRAN) injection 4 mg (has no administration in time range)  sodium chloride flush (NS) 0.9 % injection 3 mL (3 mLs Intravenous Given 08/11/19 1332)  sodium chloride 0.9 % bolus 1,000 mL (0 mLs Intravenous Stopped 08/11/19 1710)  morphine 4 MG/ML injection 4 mg (4 mg Intravenous Given 08/11/19 1331)  ondansetron (ZOFRAN) injection 4 mg (4 mg Intravenous Given 08/11/19 1330)  iohexol (OMNIPAQUE) 300 MG/ML solution 100 mL (100 mLs Intravenous Contrast Given 08/11/19 1551)    Mobility walks Low fall risk   Focused Assessments Pulmonary Assessment Handoff:        R Recommendations: See Admitting Provider Note  Report given to: Wynn Maudlin RN  Additional Notes:

## 2019-08-11 NOTE — Progress Notes (Signed)
Pt seen examined and agree with previous  evaluation Significant abdominal pain with N/V  And leukocytosis  Recommend diagnostic laparoscopy to evaluate for internal hernia  Or other potential cause  Pt understands and agrees to proceed    The procedure has been discussed with the patient.  Alternative therapies have been discussed with the patient.  Operative risks include bleeding,  Infection,  Organ injury,  Nerve injury,  Blood vessel injury,  DVT,  Pulmonary embolism,  Death,  And possible reoperation.  Medical management risks include worsening of present situation.  The success of the procedure is 50 -90 % at treating patients symptoms.  The patient understands and agrees to proceed.

## 2019-08-11 NOTE — Progress Notes (Signed)
Received patient from ED. Patient alert and oriented x4. Complaining of abdominal pain and nausea. PRN will be administered. Patient standby assist from wheelchair to bed. Made comfortable in bed. Call bell within reach. Will continue to monitor.

## 2019-08-11 NOTE — ED Provider Notes (Signed)
[facility] Provider Note   CSN: [id number] Arrival date & time: 08/11/19  1136     History Chief Complaint  Patient presents with  . Emesis    [name] is a 28 y.o. female with past medical history significant for roux and y gastric bypass 2013 presents to emergency department today with chief complaint of generalized abdominal pain and emesis x 3 days. Pain has been constant.  She describes it as aching sensation.  She rates the pain 10 out of 10 in severity.  She is unable to tolerate p.o. intake.  She admits to nausea with over 20 episodes of nonbloody nonbilious emesis in the last 24 hours.  Her last bowel movement was this morning and it was normal for her.  She states she has a history of constipation and typically has 1 bowel movement a week.  She denies any diarrhea or blood in stool.  Her LMP was x1 month ago and is due to start tomorrow. When asked if she could be pregnant she says "unsure." She admits to passing flatus.  She denies fever, chills, chest pain, gross hematuria, urinary frequency, dysuria, vaginal discharge, pelvic pain, back pain.  Denies history of kidney stones.  Abdominal surgical history includes gastric bypass as well [place]  History reviewed. No pertinent past medical history.  There are no problems to display for this patient.   Past Surgical History:  Procedure Laterality Date  . GASTRIC BYPASS       OB History   No obstetric history on file.     No family history on file.  Social History   Tobacco Use  . Smoking status: Not on file  Substance Use Topics  . Alcohol use: Not on file  . Drug use: Not on file    Home Medications Prior to Admission medications   Not on File    Allergies    Patient has no known allergies.  Review of Systems   Review of Systems  All other systems are reviewed and are negative for acute change except as noted in the  HPI.   Physical Exam Updated Vital Signs BP (!) 161/95   Pulse 65   Temp 98.5 F (36.9 C) (Oral)   Resp 15   SpO2 100%   Physical Exam Vitals and nursing note reviewed.  Constitutional:      General: She is in acute distress.     Appearance: She is diaphoretic. She is not toxic-appearing.  HENT:     Head: Normocephalic and atraumatic.     Right Ear: Tympanic membrane and external ear normal.     Left Ear: Tympanic membrane and external ear normal.     Nose: Nose normal.     Mouth/Throat:     Mouth: Mucous membranes are moist.     Pharynx: Oropharynx is clear.  Eyes:     General: No scleral icterus.       Right eye: No discharge.        Left eye: No discharge.     Extraocular Movements: Extraocular movements intact.     Conjunctiva/sclera: Conjunctivae normal.     Pupils: Pupils are equal, round, and reactive to light.  Neck:     Vascular: No JVD.  Cardiovascular:     Rate and Rhythm: Normal rate and regular rhythm.     Pulses: Normal pulses.          Radial pulses are 2+ on  the right side and 2+ on the left side.     Heart sounds: Normal heart sounds.  Pulmonary:     Comments: Lungs clear to auscultation in all fields. Symmetric chest rise. No wheezing, rales, or rhonchi. Abdominal:     Tenderness: There is no right CVA tenderness or left CVA tenderness.     Comments: Abdomen is soft, non-distended. Generalized  tenderness with voluntary guarding.  No rigidity. No peritoneal signs. Normoactive bowel sounds.  Musculoskeletal:        General: Normal range of motion.     Cervical back: Normal range of motion.  Skin:    General: Skin is warm.     Capillary Refill: Capillary refill takes less than 2 seconds.  Neurological:     Mental Status: She is oriented to person, place, and time.     GCS: GCS eye subscore is 4. GCS verbal subscore is 5. GCS motor subscore is 6.     Comments: Fluent speech, no facial droop.  Psychiatric:        Behavior: Behavior normal.         ED Results / Procedures / Treatments   Labs (all labs ordered are listed, but only abnormal results are displayed) Labs Reviewed  COMPREHENSIVE METABOLIC PANEL - Abnormal; Notable for the following components:      Result Value   CO2 18 (*)    BUN <5 (*)    Total Bilirubin 1.3 (*)    Anion gap 18 (*)    All other components within normal limits  CBC - Abnormal; Notable for the following components:   WBC 15.6 (*)    MCV 103.8 (*)    MCH 35.2 (*)    All other components within normal limits  URINALYSIS, ROUTINE W REFLEX MICROSCOPIC - Abnormal; Notable for the following components:   APPearance HAZY (*)    Ketones, ur 80 (*)    Protein, ur 100 (*)    Bacteria, UA RARE (*)    All other components within normal limits  LIPASE, BLOOD  LACTIC ACID, PLASMA  LACTIC ACID, PLASMA  I-STAT BETA HCG BLOOD, ED ([facility], [facility], [facility] ONLY)    EKG None  Radiology CT ABDOMEN PELVIS W CONTRAST  Result Date: 08/11/2019 CLINICAL DATA:  Severe abdominal pain.  History of gastric bypass. EXAM: CT ABDOMEN AND PELVIS WITH CONTRAST TECHNIQUE: Multidetector CT imaging of the abdomen and pelvis was performed using the standard protocol following bolus administration of intravenous contrast. CONTRAST:  170mL OMNIPAQUE IOHEXOL 300 MG/ML  SOLN COMPARISON:  None. FINDINGS: Lower chest: Lung bases are clear. No effusions. Heart is normal size. Hepatobiliary: Diffuse low-density throughout the liver compatible with fatty infiltration. No focal abnormality. Gallbladder not visualized, presumed prior cholecystectomy Pancreas: No focal abnormality or ductal dilatation. Spleen: No focal abnormality.  Normal size. Adrenals/Urinary Tract: No adrenal abnormality. No focal renal abnormality. No stones or hydronephrosis. Urinary bladder is unremarkable. Stomach/Bowel: Postoperative changes of prior gastric bypass. The bypassed portion of the stomach is mildly distended with fluid. No evidence of bowel obstruction. Small bowel  and large bowel unremarkable. Vascular/Lymphatic: No evidence of aneurysm or adenopathy. Reproductive: Uterus and adnexa unremarkable.  No mass. Other: No free fluid or free air. Musculoskeletal: No acute bony abnormality. IMPRESSION: Prior gastric bypass. The bypassed portion of the stomach mildly distended with fluid of unknown etiology or significance. Fatty infiltration of the liver. No acute findings. Electronically Signed   By: Rolm Baptise M.D.   On: 08/11/2019 16:08  Procedures Procedures (including critical care time)  Medications Ordered in ED Medications  sodium chloride flush (NS) 0.9 % injection 3 mL (3 mLs Intravenous Given 08/11/19 1332)  sodium chloride 0.9 % bolus 1,000 mL (1,000 mLs Intravenous New Bag/Given 08/11/19 1332)  morphine 4 MG/ML injection 4 mg (4 mg Intravenous Given 08/11/19 1331)  ondansetron (ZOFRAN) injection 4 mg (4 mg Intravenous Given 08/11/19 1330)  iohexol (OMNIPAQUE) 300 MG/ML solution 100 mL (100 mLs Intravenous Contrast Given 08/11/19 1551)    ED Course  I have reviewed the triage vital signs and the nursing notes.  Pertinent labs & imaging results that were available during my care of the patient were reviewed by me and considered in my medical decision making (see chart for details).  Clinical Course as of Aug 11 1639  Fri Aug 11, 2019  1233 Pregnancy test is negative  I-stat hCG, quantitative: <5.0 [KA]  1347 Leukocytosis 15.6, stable hemoglobin 14.9  CBC(!) [KA]  1349 Elevated anion gap of 18, bicarb of 18, likely dehydration as patient has had limited p.o. intake and > 20 episodes of emesis.Total bilirubin only slightly elevated at 1.3.  No severe electrolyte derangement.  BUN is low, normal creatinine.  Comprehensive metabolic panel(!) [KA]  1400 Lipase within normal range  Lipase: 22 [KA]  1400 UA without signs of infection  Urinalysis, Routine w reflex microscopic(!) [KA]    Clinical Course User Index [KA] Xai Frerking, Caroleen Hamman, PA-C    MDM Rules/Calculators/A&P                      Patient presents to the ED with complaints of abdominal pain. Patient nontoxic appearing, however appears very uncomfortable.  Vitals WNL.  Vitals are not concerning for sepsis. On exam patient with generalized tenderness, no peritoneal signs. She is diaphoretic. Will evaluate with labs and CT A/P. Analgesics, anti-emetics, and fluids administered.  DDx includes gastritis, SBO, appendicitis, appendicitis, diverticulitis, colitis, ectopic pregnancy.  Given her surgical history there is heightened concern for SBO.  Significant lab findings as documented above in ED course.  Pregnancy test is negative. On reassessment she continues to have abdominal pain, states it has only slightly improved after morphine.  She still feels nauseous. Imaging viewed by me shows bypassed portion of the stomach is mildly distended with fluid. Radiologist comments fluids is of unknown etiology or significance. This case was discussed with Dr. Silverio Lay who has seen the patient and agrees with plan to admit.  Case discussed with on call surgical PA Tresa Endo [place]. She and surgical attending Dr. Dwain Sarna evaluated patient and will admit to the hospital for possible diagnostic laparoscopy tonight.  Patient is stable at time of disposition.   Portions of this note were generated with Scientist, clinical (histocompatibility and immunogenetics). Dictation errors may occur despite best attempts at proofreading.   Final Clinical Impression(s) / ED Diagnoses Final diagnoses:  Generalized abdominal pain    Rx / DC Orders ED Discharge Orders    None       Sherene Sires, PA-C 08/11/19 1715    Gwyneth Sprout, MD 08/12/19 [phone]

## 2019-08-11 NOTE — Transfer of Care (Signed)
Immediate Anesthesia Transfer of Care Note  Patient: Marie Myers  Procedure(s) Performed: LAPAROSCOPY DIAGNOSTIC (N/A ) Poss EXPLORATORY LAPAROTOMY (N/A )  Patient Location: PACU  Anesthesia Type:General  Level of Consciousness: awake  Airway & Oxygen Therapy: Patient Spontanous Breathing  Post-op Assessment: Report given to RN and Post -op Vital signs reviewed and stable  Post vital signs: Reviewed and stable  Last Vitals:  Vitals Value Taken Time  BP 152/97 08/11/19 2148  Temp    Pulse 86 08/11/19 2151  Resp 14 08/11/19 2151  SpO2 99 % 08/11/19 2151  Vitals shown include unvalidated device data.  Last Pain:  Vitals:   08/11/19 1841  TempSrc:   PainSc: 8          Complications: No apparent anesthesia complications

## 2019-08-11 NOTE — Anesthesia Preprocedure Evaluation (Addendum)
Anesthesia Evaluation  Patient identified by MRN, date of birth, ID band Patient awake    Reviewed: Allergy & Precautions, H&P , NPO status , Patient's Chart, lab work & pertinent test results  Airway Mallampati: I  TM Distance: >3 FB Neck ROM: Full    Dental no notable dental hx. (+) Teeth Intact, Dental Advisory Given   Pulmonary neg pulmonary ROS,    Pulmonary exam normal breath sounds clear to auscultation       Cardiovascular negative cardio ROS   Rhythm:Regular Rate:Normal     Neuro/Psych negative neurological ROS  negative psych ROS   GI/Hepatic negative GI ROS, Neg liver ROS,   Endo/Other  negative endocrine ROS  Renal/GU negative Renal ROS  negative genitourinary   Musculoskeletal   Abdominal   Peds  Hematology negative hematology ROS (+)   Anesthesia Other Findings   Reproductive/Obstetrics negative OB ROS                            Anesthesia Physical Anesthesia Plan  ASA: I  Anesthesia Plan: General   Post-op Pain Management:    Induction: Intravenous, Rapid sequence and Cricoid pressure planned  PONV Risk Score and Plan: 4 or greater and Ondansetron, Dexamethasone and Midazolam  Airway Management Planned: Oral ETT  Additional Equipment:   Intra-op Plan:   Post-operative Plan: Extubation in OR  Informed Consent: I have reviewed the patients History and Physical, chart, labs and discussed the procedure including the risks, benefits and alternatives for the proposed anesthesia with the patient or authorized representative who has indicated his/her understanding and acceptance.     Dental advisory given  Plan Discussed with: CRNA  Anesthesia Plan Comments:        Anesthesia Quick Evaluation  

## 2019-08-12 LAB — HIV ANTIBODY (ROUTINE TESTING W REFLEX): HIV Screen 4th Generation wRfx: NONREACTIVE

## 2019-08-12 LAB — CBC
HCT: 38.3 % (ref 36.0–46.0)
Hemoglobin: 12.9 g/dL (ref 12.0–15.0)
MCH: 35.4 pg — ABNORMAL HIGH (ref 26.0–34.0)
MCHC: 33.7 g/dL (ref 30.0–36.0)
MCV: 105.2 fL — ABNORMAL HIGH (ref 80.0–100.0)
Platelets: 283 10*3/uL (ref 150–400)
RBC: 3.64 MIL/uL — ABNORMAL LOW (ref 3.87–5.11)
RDW: 12.9 % (ref 11.5–15.5)
WBC: 18 10*3/uL — ABNORMAL HIGH (ref 4.0–10.5)
nRBC: 0 % (ref 0.0–0.2)

## 2019-08-12 LAB — BASIC METABOLIC PANEL
Anion gap: 11 (ref 5–15)
BUN: 5 mg/dL — ABNORMAL LOW (ref 6–20)
CO2: 22 mmol/L (ref 22–32)
Calcium: 8.9 mg/dL (ref 8.9–10.3)
Chloride: 102 mmol/L (ref 98–111)
Creatinine, Ser: 0.79 mg/dL (ref 0.44–1.00)
GFR calc Af Amer: >60 mL/min (ref >60)
GFR calc non Af Amer: >60 mL/min (ref >60)
Glucose, Bld: 154 mg/dL — ABNORMAL HIGH (ref 70–99)
Potassium: 3.7 mmol/L (ref 3.5–5.1)
Sodium: 135 mmol/L (ref 135–145)

## 2019-08-12 MED ORDER — KETOROLAC TROMETHAMINE 30 MG/ML IJ SOLN
30.0000 mg | Freq: Three times a day (TID) | INTRAMUSCULAR | Status: AC | PRN
  Filled 2019-08-12 (×2): qty 1

## 2019-08-12 MED ORDER — MUPIROCIN 2 % EX OINT
1.0000 "application " | TOPICAL_OINTMENT | Freq: Two times a day (BID) | CUTANEOUS | Status: DC
  Administered 2019-08-12 – 2019-08-15 (×8): 1 via NASAL
  Filled 2019-08-12: qty 22

## 2019-08-12 MED ORDER — CHLORHEXIDINE GLUCONATE CLOTH 2 % EX PADS
6.0000 | MEDICATED_PAD | Freq: Every day | CUTANEOUS | Status: DC
  Administered 2019-08-12 – 2019-08-15 (×4): 6 via TOPICAL

## 2019-08-12 MED ORDER — ZOLPIDEM TARTRATE 5 MG PO TABS
5.0000 mg | ORAL_TABLET | Freq: Every evening | ORAL | Status: AC | PRN
  Administered 2019-08-13: 5 mg via ORAL
  Filled 2019-08-12: qty 1

## 2019-08-12 MED ORDER — METHOCARBAMOL 1000 MG/10ML IJ SOLN
500.0000 mg | Freq: Four times a day (QID) | INTRAVENOUS | Status: DC | PRN
  Administered 2019-08-12 – 2019-08-14 (×4): 500 mg via INTRAVENOUS
  Filled 2019-08-12: qty 5
  Filled 2019-08-12: qty 500
  Filled 2019-08-12 (×3): qty 5

## 2019-08-12 NOTE — Progress Notes (Signed)
  1 Day Post-Op  Subjective: Complains of pain, worse than before surgery. No vomiting  Objective: Vital signs in last 24 hours: Temp:  [97.6 F (36.4 C)-99.9 F (37.7 C)] 99.1 F (37.3 C) (12/19 0747) Pulse Rate:  [65-104] 69 (12/19 0747) Resp:  [13-20] 19 (12/19 0747) BP: (122-161)/(82-98) 122/82 (12/19 0747) SpO2:  [98 %-100 %] 100 % (12/19 0747) Last BM Date: 08/11/19  Intake/Output from previous day: 12/18 0701 - 12/19 0700 In: 2914.7 [P.O.:315; I.V.:1599.7; IV Piggyback:1000] Out: 35 [Urine:30; Blood:5] Intake/Output this shift: No intake/output data recorded.  General appearance: alert and cooperative Resp: clear to auscultation bilaterally Cardio: regular rate and rhythm GI: soft, diffuse tenderness out of proportion to physical findings  Lab Results:  Recent Labs    08/11/19 1146 08/12/19 0339  WBC 15.6* 18.0*  HGB 14.9 12.9  HCT 43.9 38.3  PLT 353 283   BMET Recent Labs    08/11/19 1146 08/12/19 0339  NA 138 135  K 4.0 3.7  CL 102 102  CO2 18* 22  GLUCOSE 99 154*  BUN <5* 5*  CREATININE 0.96 0.79  CALCIUM 10.0 8.9   PT/INR No results for input(s): LABPROT, INR in the last 72 hours. ABG No results for input(s): PHART, HCO3 in the last 72 hours.  Invalid input(s): PCO2, PO2  Studies/Results: CT ABDOMEN PELVIS W CONTRAST  Result Date: 08/11/2019 CLINICAL DATA:  Severe abdominal pain.  History of gastric bypass. EXAM: CT ABDOMEN AND PELVIS WITH CONTRAST TECHNIQUE: Multidetector CT imaging of the abdomen and pelvis was performed using the standard protocol following bolus administration of intravenous contrast. CONTRAST:  154mL OMNIPAQUE IOHEXOL 300 MG/ML  SOLN COMPARISON:  None. FINDINGS: Lower chest: Lung bases are clear. No effusions. Heart is normal size. Hepatobiliary: Diffuse low-density throughout the liver compatible with fatty infiltration. No focal abnormality. Gallbladder not visualized, presumed prior cholecystectomy Pancreas: No focal  abnormality or ductal dilatation. Spleen: No focal abnormality.  Normal size. Adrenals/Urinary Tract: No adrenal abnormality. No focal renal abnormality. No stones or hydronephrosis. Urinary bladder is unremarkable. Stomach/Bowel: Postoperative changes of prior gastric bypass. The bypassed portion of the stomach is mildly distended with fluid. No evidence of bowel obstruction. Small bowel and large bowel unremarkable. Vascular/Lymphatic: No evidence of aneurysm or adenopathy. Reproductive: Uterus and adnexa unremarkable.  No mass. Other: No free fluid or free air. Musculoskeletal: No acute bony abnormality. IMPRESSION: Prior gastric bypass. The bypassed portion of the stomach mildly distended with fluid of unknown etiology or significance. Fatty infiltration of the liver. No acute findings. Electronically Signed   By: Rolm Baptise M.D.   On: 08/11/2019 16:08    Anti-infectives: Anti-infectives (From admission, onward)   Start     Dose/Rate Route Frequency Ordered Stop   08/11/19 1815  cefoTEtan (CEFOTAN) 2 g in sodium chloride 0.9 % 100 mL IVPB     2 g 200 mL/hr over 30 Minutes Intravenous To Surgery 08/11/19 1802 08/11/19 2010      Assessment/Plan: s/p Procedure(s): LAPAROSCOPY DIAGNOSTIC Advance diet  Continue to work on pain control Ambulate POD 1  LOS: 1 day    Autumn Messing III 08/12/2019

## 2019-08-12 NOTE — Plan of Care (Signed)
  Problem: Education: Goal: Knowledge of General Education information will improve Description: Including pain rating scale, medication(s)/side effects and non-pharmacologic comfort measures Outcome: Progressing   Problem: Activity: Goal: Risk for activity intolerance will decrease Outcome: Progressing   Problem: Nutrition: Goal: Adequate nutrition will be maintained Outcome: Progressing   

## 2019-08-13 MED ORDER — METRONIDAZOLE IN NACL 5-0.79 MG/ML-% IV SOLN
500.0000 mg | Freq: Three times a day (TID) | INTRAVENOUS | Status: DC
  Administered 2019-08-13 – 2019-08-14 (×3): 500 mg via INTRAVENOUS
  Filled 2019-08-13 (×4): qty 100

## 2019-08-13 MED ORDER — KETOROLAC TROMETHAMINE 30 MG/ML IJ SOLN
30.0000 mg | Freq: Three times a day (TID) | INTRAMUSCULAR | Status: AC | PRN
  Administered 2019-08-13 (×2): 30 mg via INTRAVENOUS
  Filled 2019-08-13: qty 1

## 2019-08-13 MED ORDER — PIPERACILLIN-TAZOBACTAM 3.375 G IVPB
3.3750 g | Freq: Three times a day (TID) | INTRAVENOUS | Status: DC
  Administered 2019-08-13 – 2019-08-15 (×6): 3.375 g via INTRAVENOUS
  Filled 2019-08-13 (×7): qty 50

## 2019-08-13 MED ORDER — ZOLPIDEM TARTRATE 5 MG PO TABS
5.0000 mg | ORAL_TABLET | ORAL | Status: AC
  Administered 2019-08-13: 5 mg via ORAL
  Filled 2019-08-13: qty 1

## 2019-08-13 NOTE — Progress Notes (Signed)
Unable to give 1400 dose of antibiotics due to loss of patient's IV. Patient was difficult to stick. IV team consulted. IV placed the IV at [place]. Patient will receive next dose due too it being so close to that time.

## 2019-08-13 NOTE — Plan of Care (Signed)
  Problem: Activity: Goal: Risk for activity intolerance will decrease 08/13/2019 0318 by Kennith Center, RN Outcome: Progressing 08/13/2019 0219 by Kennith Center, RN Outcome: Progressing   Problem: Nutrition: Goal: Adequate nutrition will be maintained 08/13/2019 0318 by Kennith Center, RN Outcome: Progressing 08/13/2019 0219 by Kennith Center, RN Outcome: Progressing   Problem: Pain Managment: Goal: General experience of comfort will improve Outcome: Progressing

## 2019-08-13 NOTE — Plan of Care (Signed)
  Problem: Activity: Goal: Risk for activity intolerance will decrease Outcome: Progressing   Problem: Nutrition: Goal: Adequate nutrition will be maintained Outcome: Progressing   Problem: Elimination: Goal: Will not experience complications related to bowel motility Outcome: Progressing   

## 2019-08-13 NOTE — Progress Notes (Signed)
  2 Days Post-Op  Subjective: Complains of pain. Same as yesterday  Objective: Vital signs in last 24 hours: Temp:  [98 F (36.7 C)-99.1 F (37.3 C)] 98.7 F (37.1 C) (12/20 0627) Pulse Rate:  [64-93] 64 (12/20 0627) Resp:  [17-18] 17 (12/20 0627) BP: (106-137)/(71-92) 106/71 (12/20 0627) SpO2:  [99 %-100 %] 100 % (12/20 0627) Last BM Date: 08/11/19  Intake/Output from previous day: 12/19 0701 - 12/20 0700 In: 2792.9 [P.O.:410; I.V.:2332.9; IV Piggyback:50] Out: 350 [Urine:350] Intake/Output this shift: No intake/output data recorded.  General appearance: alert and cooperative Resp: clear to auscultation bilaterally Cardio: regular rate and rhythm GI: very soft but diffusely tender  Lab Results:  Recent Labs    08/11/19 1146 08/12/19 0339  WBC 15.6* 18.0*  HGB 14.9 12.9  HCT 43.9 38.3  PLT 353 283   BMET Recent Labs    08/11/19 1146 08/12/19 0339  NA 138 135  K 4.0 3.7  CL 102 102  CO2 18* 22  GLUCOSE 99 154*  BUN <5* 5*  CREATININE 0.96 0.79  CALCIUM 10.0 8.9   PT/INR No results for input(s): LABPROT, INR in the last 72 hours. ABG No results for input(s): PHART, HCO3 in the last 72 hours.  Invalid input(s): PCO2, PO2  Studies/Results: CT ABDOMEN PELVIS W CONTRAST  Result Date: 08/11/2019 CLINICAL DATA:  Severe abdominal pain.  History of gastric bypass. EXAM: CT ABDOMEN AND PELVIS WITH CONTRAST TECHNIQUE: Multidetector CT imaging of the abdomen and pelvis was performed using the standard protocol following bolus administration of intravenous contrast. CONTRAST:  114mL OMNIPAQUE IOHEXOL 300 MG/ML  SOLN COMPARISON:  None. FINDINGS: Lower chest: Lung bases are clear. No effusions. Heart is normal size. Hepatobiliary: Diffuse low-density throughout the liver compatible with fatty infiltration. No focal abnormality. Gallbladder not visualized, presumed prior cholecystectomy Pancreas: No focal abnormality or ductal dilatation. Spleen: No focal abnormality.   Normal size. Adrenals/Urinary Tract: No adrenal abnormality. No focal renal abnormality. No stones or hydronephrosis. Urinary bladder is unremarkable. Stomach/Bowel: Postoperative changes of prior gastric bypass. The bypassed portion of the stomach is mildly distended with fluid. No evidence of bowel obstruction. Small bowel and large bowel unremarkable. Vascular/Lymphatic: No evidence of aneurysm or adenopathy. Reproductive: Uterus and adnexa unremarkable.  No mass. Other: No free fluid or free air. Musculoskeletal: No acute bony abnormality. IMPRESSION: Prior gastric bypass. The bypassed portion of the stomach mildly distended with fluid of unknown etiology or significance. Fatty infiltration of the liver. No acute findings. Electronically Signed   By: Rolm Baptise M.D.   On: 08/11/2019 16:08    Anti-infectives: Anti-infectives (From admission, onward)   Start     Dose/Rate Route Frequency Ordered Stop   08/13/19 0800  piperacillin-tazobactam (ZOSYN) IVPB 3.375 g     3.375 g 12.5 mL/hr over 240 Minutes Intravenous Every 8 hours 08/13/19 0744     08/11/19 1815  cefoTEtan (CEFOTAN) 2 g in sodium chloride 0.9 % 100 mL IVPB     2 g 200 mL/hr over 30 Minutes Intravenous To Surgery 08/11/19 1802 08/11/19 2010      Assessment/Plan: s/p Procedure(s): LAPAROSCOPY DIAGNOSTIC stay on fulls for now  Wbc elevated. Will start empiric zosyn/flagyl Plan for UGI tomorrow to evaluate anatomy since laparoscopy was neg  LOS: 2 days    Autumn Messing III 08/13/2019

## 2019-08-14 ENCOUNTER — Inpatient Hospital Stay (HOSPITAL_COMMUNITY): Payer: Self-pay

## 2019-08-14 LAB — COMPREHENSIVE METABOLIC PANEL
ALT: 11 U/L (ref 0–44)
AST: 14 U/L — ABNORMAL LOW (ref 15–41)
Albumin: 2.7 g/dL — ABNORMAL LOW (ref 3.5–5.0)
Alkaline Phosphatase: 43 U/L (ref 38–126)
Anion gap: 8 (ref 5–15)
BUN: <5 mg/dL — ABNORMAL LOW (ref 6–20)
CO2: 19 mmol/L — ABNORMAL LOW (ref 22–32)
Calcium: 8.2 mg/dL — ABNORMAL LOW (ref 8.9–10.3)
Chloride: 112 mmol/L — ABNORMAL HIGH (ref 98–111)
Creatinine, Ser: 0.68 mg/dL (ref 0.44–1.00)
GFR calc Af Amer: >60 mL/min (ref >60)
GFR calc non Af Amer: >60 mL/min (ref >60)
Glucose, Bld: 80 mg/dL (ref 70–99)
Potassium: 3.8 mmol/L (ref 3.5–5.1)
Sodium: 139 mmol/L (ref 135–145)
Total Bilirubin: 0.7 mg/dL (ref 0.3–1.2)
Total Protein: 4.7 g/dL — ABNORMAL LOW (ref 6.5–8.1)

## 2019-08-14 LAB — CBC
HCT: 29.8 % — ABNORMAL LOW (ref 36.0–46.0)
Hemoglobin: 10 g/dL — ABNORMAL LOW (ref 12.0–15.0)
MCH: 35.2 pg — ABNORMAL HIGH (ref 26.0–34.0)
MCHC: 33.6 g/dL (ref 30.0–36.0)
MCV: 104.9 fL — ABNORMAL HIGH (ref 80.0–100.0)
Platelets: 201 10*3/uL (ref 150–400)
RBC: 2.84 MIL/uL — ABNORMAL LOW (ref 3.87–5.11)
RDW: 12.9 % (ref 11.5–15.5)
WBC: 8.6 10*3/uL (ref 4.0–10.5)
nRBC: 0 % (ref 0.0–0.2)

## 2019-08-14 LAB — LIPASE, BLOOD: Lipase: 17 U/L (ref 11–51)

## 2019-08-14 NOTE — Progress Notes (Signed)
3 Days Post-Op  Subjective: Says she feels about the same.  Complains of constant central and upper abdominal pain.  Noncrampy.  Says when she swallows any liquid or food that she regurgitates white foam, nothing bilious.  Says when she swallows something that she regurgitates within a minute.   Alert.  Stable.  Afebrile.  Temp 99.8.  Heart rate 90.  Voiding well. Last lab work on 1219. Will repeat today   laparoscopy negative for obstruction, ischemia, inflammatory change 1218.  Mild dilatation of gastric remnant and biliopancreatic limb down to the jejunojejunostomy commented upon.  Upper GI today Objective: Vital signs in last 24 hours: Temp:  [98.6 F (37 C)-99.8 F (37.7 C)] 99.8 F (37.7 C) (12/20 2007) Pulse Rate:  [64-90] 90 (12/20 2007) Resp:  [14-17] 14 (12/20 2007) BP: (106-127)/(71-88) 114/77 (12/20 2007) SpO2:  [100 %] 100 % (12/20 2007) Last BM Date: 08/13/19  Intake/Output from previous day: 12/20 0701 - 12/21 0700 In: 2434.5 [P.O.:560; I.V.:1474.4; IV Piggyback:400.1] Out: 375 [Urine:375] Intake/Output this shift: Total I/O In: 2434.5 [P.O.:560; I.V.:1474.4; IV Piggyback:400.1] Out: -   General appearance: Alert.  Does not appear to be in any physical distress.  Answers questions appropriately Resp: clear to auscultation bilaterally GI: Soft.  Nondistended.  Bowel sounds active.  Diffuse tenderness without peritoneal signs.  Lab Results:  No results found for this or any previous visit (from the past 24 hour(s)).   Studies/Results: No results found.  . Chlorhexidine Gluconate Cloth  6 each Topical Daily  . enoxaparin (LOVENOX) injection  40 mg Subcutaneous Q24H  . mupirocin ointment  1 application Nasal BID  . pantoprazole  40 mg Oral Daily  . sucralfate  1 g Oral TID WC & HS     Assessment/Plan: s/p Procedure(s): LAPAROSCOPY DIAGNOSTIC   POD #3.  Diagnostic laparoscopy.  Negative for obstruction or inflammatory change.   question distention  gastric remnant  Abdominal pain and nausea.  Postprandial nonbilious vomiting.  Not well explained. Last endoscopy 2 years ago poor per patient's report.  Possibly dilated Upper GI today Consider endoscopy  Leukocytosis.  Started on empiric Zosyn and Flagyl yesterday. Repeat labs today  History Roux-en-Y gastric bypass, 2016, [place]    @PROBHOSP @  LOS: 3 days    Adin Hector 08/14/2019  . .prob

## 2019-08-15 ENCOUNTER — Encounter (HOSPITAL_COMMUNITY): Payer: Self-pay

## 2019-08-15 ENCOUNTER — Inpatient Hospital Stay (HOSPITAL_COMMUNITY): Payer: Self-pay

## 2019-08-15 LAB — BASIC METABOLIC PANEL
Anion gap: 10 (ref 5–15)
BUN: <5 mg/dL — ABNORMAL LOW (ref 6–20)
CO2: 21 mmol/L — ABNORMAL LOW (ref 22–32)
Calcium: 8.5 mg/dL — ABNORMAL LOW (ref 8.9–10.3)
Chloride: 109 mmol/L (ref 98–111)
Creatinine, Ser: 0.75 mg/dL (ref 0.44–1.00)
GFR calc Af Amer: >60 mL/min (ref >60)
GFR calc non Af Amer: >60 mL/min (ref >60)
Glucose, Bld: 78 mg/dL (ref 70–99)
Potassium: 3.5 mmol/L (ref 3.5–5.1)
Sodium: 140 mmol/L (ref 135–145)

## 2019-08-15 LAB — MAGNESIUM: Magnesium: 1.6 mg/dL — ABNORMAL LOW (ref 1.7–2.4)

## 2019-08-15 MED ORDER — METOCLOPRAMIDE HCL 5 MG PO TABS: 5.0000 mg | ORAL_TABLET | Freq: Three times a day (TID) | ORAL | 0 refills | Status: DC | PRN

## 2019-08-15 MED ORDER — PANTOPRAZOLE SODIUM 40 MG PO TBEC: 40.0000 mg | DELAYED_RELEASE_TABLET | Freq: Every day | ORAL | 0 refills | Status: DC

## 2019-08-15 MED ORDER — SUCRALFATE 1 GM/10ML PO SUSP: 1.0000 g | Freq: Three times a day (TID) | ORAL | 0 refills | Status: DC

## 2019-08-15 MED ORDER — HYDROMORPHONE HCL 1 MG/ML IJ SOLN: 0.5000 mg | INTRAMUSCULAR | Status: DC | PRN

## 2019-08-15 MED ORDER — METOCLOPRAMIDE HCL 5 MG/ML IJ SOLN
5.0000 mg | Freq: Three times a day (TID) | INTRAMUSCULAR | Status: DC
  Administered 2019-08-15 (×2): 5 mg via INTRAVENOUS
  Filled 2019-08-15 (×2): qty 2

## 2019-08-15 MED ORDER — TRAMADOL HCL 50 MG PO TABS
100.0000 mg | ORAL_TABLET | Freq: Two times a day (BID) | ORAL | Status: DC | PRN
  Administered 2019-08-15: 100 mg via ORAL
  Filled 2019-08-15: qty 2

## 2019-08-15 MED ORDER — OXYCODONE-ACETAMINOPHEN 5-325 MG PO TABS: 1.0000 | ORAL_TABLET | Freq: Four times a day (QID) | ORAL | 0 refills | Status: DC | PRN

## 2019-08-15 MED FILL — METOCLOPRAMIDE 5 MG TABLET: 5 | 20 days supply | Qty: 60 | Fill #0

## 2019-08-15 MED FILL — OXYCODONE-APAP 5-325MG: 5-325 | 5 days supply | Qty: 20 | Fill #0

## 2019-08-15 MED FILL — PANTOPRAZOLE SOD DR 40 MG T: 40 | 30 days supply | Qty: 30 | Fill #0

## 2019-08-15 MED FILL — SUCRALFATE 1 GM/10ML SUSP: 1 | 10 days supply | Qty: 420 | Fill #0

## 2019-08-15 NOTE — Progress Notes (Signed)
Patient discharged to home. Verbalized understanding of all discharge instructions including incision care, discharge medications and follow up MD visits. 

## 2019-08-15 NOTE — Progress Notes (Signed)
4 Days Post-Op  Subjective:  Stable and alert.  Says she feels about the same and still has constant pain. Says she is dependent on Zofran for nausea She says she has been through evaluations like this before in [place] and they were never able to figure out what was the matter. She says that she moved here 1 month ago.  She does not have a gastroenterologist  On further questioning she states that she is tolerating the full liquid diet and eats most of the full liquids on the tray without any vomiting. 3 is loose stools yesterday.  Upper GI shows contrast goes quickly from gastric pouch into the jejunum.  Jejunum slightly dilated and motility suggests ileus.  No delayed films taken  I offered to have GI see her for endoscopy to see if we can look at the GJ and JJ to rule out any stricture.  She said that would be fine but wanted it done today.  I told her that might not be possible.  She stated that she did not want to just sit here and would like to be home at Christmas.  She is contemplating going home for Christmas on Zofran and following up with GI as an outpatient I offered to let her go home She stated that she wanted to try solid food first We will advance her bariatric diet and see what happens IV Reglan every 8 hours Follow-up KUB ordered  Objective: Vital signs in last 24 hours: Temp:  [98.5 F (36.9 C)-98.7 F (37.1 C)] 98.6 F (37 C) (12/22 0506) Pulse Rate:  [61-74] 61 (12/22 0506) Resp:  [15-16] 15 (12/22 0506) BP: (120-134)/(73-98) 120/73 (12/22 0506) SpO2:  [99 %-100 %] 99 % (12/22 0506) Last BM Date: 08/14/19  Intake/Output from previous day: 12/21 0701 - 12/22 0700 In: 2745.4 [P.O.:440; I.V.:2305.4] Out: 300 [Urine:300] Intake/Output this shift: Total I/O In: 1945.4 [P.O.:440; I.V.:1505.4] Out: -    Exam: General appearance: Alert.  Does not appear to be in any distress. Lungs: Clear to auscultation bilaterally Abdomen: Soft.  Nondistended.  Normal  bowel sounds.  No mass or hernia.  Mild diffuse tenderness, subjectively.   Lab Results:  Results for orders placed or performed during the hospital encounter of 08/11/19 (from the past 24 hour(s))  CBC     Status: Abnormal   Collection Time: 08/14/19  7:28 AM  Result Value Ref Range   WBC 8.6 4.0 - 10.5 K/uL   RBC 2.84 (L) 3.87 - 5.11 MIL/uL   Hemoglobin 10.0 (L) 12.0 - 15.0 g/dL   HCT 42.6 (L) 83.4 - 19.6 %   MCV 104.9 (H) 80.0 - 100.0 fL   MCH 35.2 (H) 26.0 - 34.0 pg   MCHC 33.6 30.0 - 36.0 g/dL   RDW 22.2 97.9 - 89.2 %   Platelets 201 150 - 400 K/uL   nRBC 0.0 0.0 - 0.2 %  Comprehensive metabolic panel     Status: Abnormal   Collection Time: 08/14/19  7:28 AM  Result Value Ref Range   Sodium 139 135 - 145 mmol/L   Potassium 3.8 3.5 - 5.1 mmol/L   Chloride 112 (H) 98 - 111 mmol/L   CO2 19 (L) 22 - 32 mmol/L   Glucose, Bld 80 70 - 99 mg/dL   BUN <5 (L) 6 - 20 mg/dL   Creatinine, Ser 1.19 0.44 - 1.00 mg/dL   Calcium 8.2 (L) 8.9 - 10.3 mg/dL   Total Protein 4.7 (L) 6.5 - 8.1  g/dL   Albumin 2.7 (L) 3.5 - 5.0 g/dL   AST 14 (L) 15 - 41 U/L   ALT 11 0 - 44 U/L   Alkaline Phosphatase 43 38 - 126 U/L   Total Bilirubin 0.7 0.3 - 1.2 mg/dL   GFR calc non Af Amer >60 >60 mL/min   GFR calc Af Amer >60 >60 mL/min   Anion gap 8 5 - 15  Lipase, blood     Status: None   Collection Time: 08/14/19  7:28 AM  Result Value Ref Range   Lipase 17 11 - 51 U/L     Studies/Results: DG UGI W SINGLE CM (SOL OR THIN BA)  Result Date: 08/14/2019 CLINICAL DATA:  Abdominal pain, vomiting. Remote history of gastric bypass. EXAM: UPPER GI SERIES WITH KUB TECHNIQUE: After obtaining a scout radiograph a routine upper GI series was performed using thin barium FLUOROSCOPY TIME:  Fluoroscopy Time:  5 minutes 48 seconds Radiation Exposure Index (if provided by the fluoroscopic device): Number of Acquired Spot Images: 0 COMPARISON:  CT 08/11/2019 FINDINGS: Swallowing demonstrates normal appearance of the  esophagus. Small gastric remanent noted and unremarkable. This promptly empties into jejunum through the gastrojejunal anastomosis. Proximal jejunum is dilated and slowly empties. This slowly empties into more normal caliber small bowel with gradual decrease in the caliber. No focal abrupt caliber change to suggest obstruction. I suspect this represents ileus. IMPRESSION: Very slow emptying of contrast through the proximal jejunum with gradual caliber change. Favor ileus. No well-defined focal abrupt caliber change to suggest obstruction. Electronically Signed   By: Rolm Baptise M.D.   On: 08/14/2019 08:50    . Chlorhexidine Gluconate Cloth  6 each Topical Daily  . enoxaparin (LOVENOX) injection  40 mg Subcutaneous Q24H  . metoCLOPramide (REGLAN) injection  5 mg Intravenous Q8H  . mupirocin ointment  1 application Nasal BID  . pantoprazole  40 mg Oral Daily  . sucralfate  1 g Oral TID WC & HS     Assessment/Plan: s/p Procedure(s): LAPAROSCOPY DIAGNOSTIC  POD #4.  Diagnostic laparoscopy.  Negative for obstruction or inflammatory change.   question distention gastric remnant  Abdominal pain and nausea.  Postprandial nonbilious vomiting.  Not well explained. Last endoscopy 2 years ago poor per patient's report.  She is tolerating full liquid diet without vomiting now but says she needs her Zofran. Upper GI yesterday suggest no stenosis of gastrojejunostomy and possible ileus Follow-up KUB today to see how far down the barium went We will start empiric Reglan every 8 hours   Consider endoscopy.  She stated that she would like to have the endoscopy today so she can go home.  I told her that may not be possible.  She stated that she would then  rather try to eat solid food and if that goes well to be discharged on Zofran and follow-up with gastroenterologist as an outpatient.  Given that this she is clinically stable I think this is a reasonable approach.  If she vomits then I think GI consult  would be in order. Advance bariatric diet ordered  Leukocytosis.  Started on empiric Zosyn and Flagyl 08/13/2019 Resolved Repeat labs yesterday showed WBC 8600.  Hemoglobin 10.0.  Calcium 8.2.  Albumin 2.7.  Lipase normal Check magnesium today Discontinue antibiotics  History Roux-en-Y gastric bypass, 2016, [place]  @PROBHOSP @  LOS: 4 days    Adin Hector 08/15/2019  . .prob

## 2019-08-15 NOTE — Discharge Summary (Signed)
[facility] Discharge Summary   Patient ID: Marie Myers MRN: [id number] DOB/AGE: 28/19/1992 28 y.o.  Admit date: 08/11/2019 Discharge date: 08/16/2019  Admitting Diagnosis: Abdominal pain  Nausea and vomiting History of Roux-en-Y gastric bypass  Discharge Diagnosis Patient Active Problem List   Diagnosis Date Noted  . Abdominal pain 08/11/2019    Consultants None  Imaging: DG Abd Portable 1V  Result Date: 08/15/2019 CLINICAL DATA:  Nausea, vomiting, pain. Evaluation of progression of barium from yesterday's upper GI. EXAM: PORTABLE ABDOMEN - 1 VIEW COMPARISON:  08/14/2019. FINDINGS: Previously administered barium is now noted throughout the colon. No bowel distention noted. No free air identified. Degenerative change lumbar spine and both hips. Postsurgical changes left hip. IMPRESSION: Previous administered barium is now noted throughout the colon. No bowel distention noted. Electronically Signed   By: Maisie Fus  Register   On: 08/15/2019 07:38    Procedures Dr. Luisa Hart (08/11/19) - diagnostic laparoscopy  Hospital Course:  Patient is a 28 year old female with hx of gastric bypass in 2016 who presented to [facility] with nausea, vomiting and abdominal pain.  Workup showed mildly distended bypassed stomach but there was concern for possible internal hernia given tenderness.  Patient was admitted and observed, lactate checked. Patient examined again later in the evening and decision was made to take to the OR for exploration. She underwent procedure listed above. Tolerated procedure well and was transferred to the floor. Diagnostic laparoscopy was negative for acute process. UGI shows quick emptying from gastric pouch into jejunum. Patient offered GI consult while in the hospital and patient elected to trial PO and go go home with antiemetics and follow up with GI outpatient.   On 08/15/19, the patient was voiding well, tolerating diet, ambulating well, pain well controlled,  vital signs stable, incisions c/d/i and felt stable for discharge home.  Patient will follow up with GI and bariatric surgery and knows to call with questions or concerns.  She will call to confirm appointment date/time.     Allergies as of 08/15/2019   No Known Allergies     Medication List    TAKE these medications   metoCLOPramide 5 MG tablet Commonly known as: Reglan Take 1 tablet (5 mg total) by mouth every 8 (eight) hours as needed for up to 20 days for nausea or vomiting.   oxyCODONE-acetaminophen 5-325 MG tablet Commonly known as: PERCOCET/ROXICET Take 1 tablet by mouth every 6 (six) hours as needed for moderate pain or severe pain.   pantoprazole 40 MG tablet Commonly known as: PROTONIX Take 1 tablet (40 mg total) by mouth daily.   sucralfate 1 GM/10ML suspension Commonly known as: CARAFATE Take 10 mLs (1 g total) by mouth 4 (four) times daily -  with meals and at bedtime.        Follow-up Information    Gastroenterology, [facility]. Call.   Why: Our office should be sending a referral for you to see one with GI. If you have not heard from them in a few days please call.  Contact information: [address] [place] [address] [phone]        Surgery, [place]. Go on 09/05/2019.   Specialty: General Surgery Why: Follow up appointment with bariatric surgeon scheduled for 2:40 PM. Please arrive 30 min prior to appointment time. Bring photo ID and insurance information.  Contact information: [address] [place] [address] [phone]        [facility]. Schedule an appointment as soon as possible for a visit.   Contact information: [place] [address] [phone]          Signed: Brigid Re, [facility] 08/16/2019, 3:18 PM Please see Amion for pager number during day hours 7:00am-4:30pm

## 2019-08-15 NOTE — Discharge Instructions (Signed)
Nausea and Vomiting, Adult °Nausea is feeling sick to your stomach or feeling that you are about to throw up (vomit). Vomiting is when food in your stomach is thrown up and out of the mouth. Throwing up can make you feel weak. It can also make you lose too much water in your body (get dehydrated). If you lose too much water in your body, you may: °· Feel tired. °· Feel thirsty. °· Have a dry mouth. °· Have cracked lips. °· Go pee (urinate) less often. °Older adults and people with other diseases or a weak body defense system (immune system) are at higher risk for losing too much water in the body. If you feel sick to your stomach and you throw up, it is important to follow instructions from your doctor about how to take care of yourself. °Follow these instructions at home: °Watch your symptoms for any changes. Tell your doctor about them. Follow these instructions to care for yourself at home. °Eating and drinking ° °  ° °· Take an ORS (oral rehydration solution). This is a drink that is sold at pharmacies and stores. °· Drink clear fluids in small amounts as you are able, such as: °? Water. °? Ice chips. °? Fruit juice that has water added (diluted fruit juice). °? Low-calorie sports drinks. °· Eat bland, easy-to-digest foods in small amounts as you are able, such as: °? Bananas. °? Applesauce. °? Rice. °? Low-fat (lean) meats. °? Toast. °? Crackers. °· Avoid drinking fluids that have a lot of sugar or caffeine in them. This includes energy drinks, sports drinks, and soda. °· Avoid alcohol. °· Avoid spicy or fatty foods. °General instructions °· Take over-the-counter and prescription medicines only as told by your doctor. °· Drink enough fluid to keep your pee (urine) pale yellow. °· Wash your hands often with soap and water. If you cannot use soap and water, use hand sanitizer. °· Make sure that all people in your home wash their hands well and often. °· Rest at home while you get better. °· Watch your condition  for any changes. °· Take slow and deep breaths when you feel sick to your stomach. °· Keep all follow-up visits as told by your doctor. This is important. °Contact a doctor if: °· Your symptoms get worse. °· You have new symptoms. °· You have a fever. °· You cannot drink fluids without throwing up. °· You feel sick to your stomach for more than 2 days. °· You feel light-headed or dizzy. °· You have a headache. °· You have muscle cramps. °· You have a rash. °· You have pain while peeing. °Get help right away if: °· You have pain in your chest, neck, arm, or jaw. °· You feel very weak or you pass out (faint). °· You throw up again and again. °· You have throw up that is bright red or looks like black coffee grounds. °· You have bloody or black poop (stools) or poop that looks like tar. °· You have a very bad headache, a stiff neck, or both. °· You have very bad pain, cramping, or bloating in your belly (abdomen). °· You have trouble breathing. °· You are breathing very quickly. °· Your heart is beating very quickly. °· Your skin feels cold and clammy. °· You feel confused. °· You have signs of losing too much water in your body, such as: °? Dark pee, very little pee, or no pee. °? Cracked lips. °? Dry mouth. °? Sunken eyes. °?   Sleepiness. °? Weakness. °These symptoms may be an emergency. Do not wait to see if the symptoms will go away. Get medical help right away. Call your local emergency services (911 in the [place] Do not drive yourself to the hospital. °Summary °· Nausea is feeling sick to your stomach or feeling that you are about to throw up (vomit). Vomiting is when food in your stomach is thrown up and out of the mouth. °· Follow instructions from your doctor about eating and drinking to keep from losing too much water in your body. °· Take over-the-counter and prescription medicines only as told by your doctor. °· Contact your doctor if your symptoms get worse or you have new symptoms. °· Keep all follow-up  visits as told by your doctor. This is important. °This information is not intended to replace advice given to you by your health care provider. Make sure you discuss any questions you have with your health care provider. °Document Released: 01/27/2008 Document Revised: 12/02/2018 Document Reviewed: 01/18/2018 °Elsevier Patient Education © 2020 [employer] ° °

## 2019-08-15 NOTE — TOC Transition Note (Signed)
Transition of Care [facility]) - CM/SW Discharge Note   Patient Details  Name: Marie Myers MRN: [id number] Date of Birth: [birth year]  Transition of Care [facility]) CM/SW Contact:  Marilu Favre, RN Phone Number: 08/15/2019, 3:47 PM   Clinical Narrative:     Provided [facility] information. Patient has no insurance. Explained [facility] program, patient voiced understanding. Scripts sent to [facility] , will fill medications at no cost to patient   Final next level of care: Home/Self Care Barriers to Discharge: No Barriers Identified   Patient Goals and CMS Choice Patient states their goals for this hospitalization and ongoing recovery are:: to return to home CMS Medicare.gov Compare Post Acute Care list provided to:: Patient Choice offered to / list presented to : NA  Discharge Placement                       Discharge Plan and Services In-house Referral: Financial Counselor Discharge Planning Services: CM Consult, [facility], [facility] Program, Medication Assistance Post Acute Care Choice: NA          DME Arranged: N/A         HH Arranged: NA          Social Determinants of Health (SDOH) Interventions     Readmission Risk Interventions No flowsheet data found.

## 2019-12-27 ENCOUNTER — Emergency Department (HOSPITAL_COMMUNITY): Payer: Self-pay

## 2019-12-27 ENCOUNTER — Other Ambulatory Visit: Payer: Self-pay

## 2019-12-27 ENCOUNTER — Emergency Department (HOSPITAL_COMMUNITY)
Admission: EM | Admit: 2019-12-27 | Discharge: 2019-12-27 | Disposition: A | Discharge status: Home / Self Care | Payer: Self-pay | Attending: Emergency Medicine | Admitting: Emergency Medicine

## 2019-12-27 DIAGNOSIS — Y92410 Unspecified street and highway as the place of occurrence of the external cause: Secondary | ICD-10-CM | POA: Insufficient documentation

## 2019-12-27 DIAGNOSIS — Y9389 Activity, other specified: Secondary | ICD-10-CM | POA: Insufficient documentation

## 2019-12-27 DIAGNOSIS — R059 Cough, unspecified: Secondary | ICD-10-CM

## 2019-12-27 DIAGNOSIS — Z79899 Other long term (current) drug therapy: Secondary | ICD-10-CM | POA: Insufficient documentation

## 2019-12-27 DIAGNOSIS — S76011A Strain of muscle, fascia and tendon of right hip, initial encounter: Secondary | ICD-10-CM | POA: Insufficient documentation

## 2019-12-27 DIAGNOSIS — Y998 Other external cause status: Secondary | ICD-10-CM | POA: Insufficient documentation

## 2019-12-27 DIAGNOSIS — R05 Cough: Secondary | ICD-10-CM | POA: Insufficient documentation

## 2019-12-27 LAB — BASIC METABOLIC PANEL
Anion gap: 12 (ref 5–15)
BUN: <5 mg/dL — ABNORMAL LOW (ref 6–20)
CO2: 23 mmol/L (ref 22–32)
Calcium: 9 mg/dL (ref 8.9–10.3)
Chloride: 104 mmol/L (ref 98–111)
Creatinine, Ser: 0.66 mg/dL (ref 0.44–1.00)
GFR calc Af Amer: >60 mL/min (ref >60)
GFR calc non Af Amer: >60 mL/min (ref >60)
Glucose, Bld: 80 mg/dL (ref 70–99)
Potassium: 4.2 mmol/L (ref 3.5–5.1)
Sodium: 139 mmol/L (ref 135–145)

## 2019-12-27 LAB — CBC WITH DIFFERENTIAL/PLATELET
Abs Immature Granulocytes: 0.05 10*3/uL (ref 0.00–0.07)
Basophils Absolute: 0.1 10*3/uL (ref 0.0–0.1)
Basophils Relative: 1 %
Eosinophils Absolute: 0.1 10*3/uL (ref 0.0–0.5)
Eosinophils Relative: 1 %
HCT: 35.4 % — ABNORMAL LOW (ref 36.0–46.0)
Hemoglobin: 11.8 g/dL — ABNORMAL LOW (ref 12.0–15.0)
Immature Granulocytes: 1 %
Lymphocytes Relative: 15 %
Lymphs Abs: 1.5 10*3/uL (ref 0.7–4.0)
MCH: 36.1 pg — ABNORMAL HIGH (ref 26.0–34.0)
MCHC: 33.3 g/dL (ref 30.0–36.0)
MCV: 108.3 fL — ABNORMAL HIGH (ref 80.0–100.0)
Monocytes Absolute: 1.2 10*3/uL — ABNORMAL HIGH (ref 0.1–1.0)
Monocytes Relative: 13 %
Neutro Abs: 6.7 10*3/uL (ref 1.7–7.7)
Neutrophils Relative %: 69 %
Platelets: 283 10*3/uL (ref 150–400)
RBC: 3.27 MIL/uL — ABNORMAL LOW (ref 3.87–5.11)
RDW: 14.7 % (ref 11.5–15.5)
WBC: 9.6 10*3/uL (ref 4.0–10.5)
nRBC: 0 % (ref 0.0–0.2)

## 2019-12-27 LAB — I-STAT BETA HCG BLOOD, ED (MC, WL, AP ONLY): I-stat hCG, quantitative: <5 m[IU]/mL (ref <5)

## 2019-12-27 MED ORDER — METHOCARBAMOL 750 MG PO TABS
750.0000 mg | ORAL_TABLET | Freq: Four times a day (QID) | ORAL | 0 refills | Status: DC
Start: 2019-12-27 — End: 2020-10-15

## 2019-12-27 MED ORDER — LORAZEPAM 2 MG/ML IJ SOLN
1.0000 mg | Freq: Once | INTRAMUSCULAR | Status: AC
  Administered 2019-12-27: 1 mg via INTRAVENOUS
  Filled 2019-12-27: qty 1

## 2019-12-27 MED ORDER — OXYCODONE-ACETAMINOPHEN 5-325 MG PO TABS: 2.0000 | ORAL_TABLET | ORAL | 0 refills | Status: DC | PRN

## 2019-12-27 MED ORDER — MORPHINE SULFATE (PF) 4 MG/ML IV SOLN
4.0000 mg | Freq: Once | INTRAVENOUS | Status: AC
  Administered 2019-12-27: 4 mg via INTRAVENOUS
  Filled 2019-12-27: qty 1

## 2019-12-27 NOTE — ED Notes (Signed)
Patient given discharge instructions patient verbalizes understanding. 
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
## 2019-12-27 NOTE — ED Notes (Signed)
Pt transported to MRI 
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
## 2019-12-27 NOTE — Progress Notes (Signed)
Orthopedic Tech Progress Note Patient Details:  Marie Myers [birth year] [id number]  Ortho Devices Type of Ortho Device: Crutches Ortho Device/Splint Location: LE Ortho Device/Splint Interventions: Adjustment   Post Interventions Patient Tolerated: Well Instructions Provided: Adjustment of device, Poper ambulation with device   [name] E Raylea Adcox 12/27/2019, 2:21 PM

## 2019-12-27 NOTE — ED Notes (Signed)
Pt last ate and drank last night around 11pm .

## 2020-06-25 ENCOUNTER — Emergency Department (HOSPITAL_COMMUNITY): Payer: BC Managed Care – PPO

## 2020-06-25 ENCOUNTER — Other Ambulatory Visit: Payer: Self-pay

## 2020-06-25 ENCOUNTER — Emergency Department (HOSPITAL_COMMUNITY)
Admission: EM | Admit: 2020-06-25 | Discharge: 2020-06-25 | Disposition: A | Discharge status: Home / Self Care | Payer: BC Managed Care – PPO | Attending: Emergency Medicine | Admitting: Emergency Medicine

## 2020-06-25 DIAGNOSIS — Z5321 Procedure and treatment not carried out due to patient leaving prior to being seen by health care provider: Secondary | ICD-10-CM | POA: Diagnosis not present

## 2020-06-25 DIAGNOSIS — R0981 Nasal congestion: Secondary | ICD-10-CM | POA: Insufficient documentation

## 2020-06-25 LAB — CBC WITH DIFFERENTIAL/PLATELET
Abs Immature Granulocytes: 0.06 10*3/uL (ref 0.00–0.07)
Basophils Absolute: 0 10*3/uL (ref 0.0–0.1)
Basophils Relative: 0 %
Eosinophils Absolute: 0.1 10*3/uL (ref 0.0–0.5)
Eosinophils Relative: 1 %
HCT: 40.8 % (ref 36.0–46.0)
Hemoglobin: 13.8 g/dL (ref 12.0–15.0)
Immature Granulocytes: 1 %
Lymphocytes Relative: 3 %
Lymphs Abs: 0.2 10*3/uL — ABNORMAL LOW (ref 0.7–4.0)
MCH: 36 pg — ABNORMAL HIGH (ref 26.0–34.0)
MCHC: 33.8 g/dL (ref 30.0–36.0)
MCV: 106.5 fL — ABNORMAL HIGH (ref 80.0–100.0)
Monocytes Absolute: 0.4 10*3/uL (ref 0.1–1.0)
Monocytes Relative: 5 %
Neutro Abs: 7 10*3/uL (ref 1.7–7.7)
Neutrophils Relative %: 90 %
Platelets: 147 10*3/uL — ABNORMAL LOW (ref 150–400)
RBC: 3.83 MIL/uL — ABNORMAL LOW (ref 3.87–5.11)
RDW: 12.4 % (ref 11.5–15.5)
WBC: 7.7 10*3/uL (ref 4.0–10.5)
nRBC: 0 % (ref 0.0–0.2)

## 2020-06-25 LAB — COMPREHENSIVE METABOLIC PANEL
ALT: 17 U/L (ref 0–44)
AST: 26 U/L (ref 15–41)
Albumin: 3.8 g/dL (ref 3.5–5.0)
Alkaline Phosphatase: 69 U/L (ref 38–126)
Anion gap: 11 (ref 5–15)
BUN: 8 mg/dL (ref 6–20)
CO2: 23 mmol/L (ref 22–32)
Calcium: 8.9 mg/dL (ref 8.9–10.3)
Chloride: 102 mmol/L (ref 98–111)
Creatinine, Ser: 0.87 mg/dL (ref 0.44–1.00)
GFR, Estimated: >60 mL/min (ref >60)
Glucose, Bld: 91 mg/dL (ref 70–99)
Potassium: 4.4 mmol/L (ref 3.5–5.1)
Sodium: 136 mmol/L (ref 135–145)
Total Bilirubin: 0.8 mg/dL (ref 0.3–1.2)
Total Protein: 7 g/dL (ref 6.5–8.1)

## 2020-06-25 LAB — I-STAT BETA HCG BLOOD, ED (MC, WL, AP ONLY): I-stat hCG, quantitative: <5 m[IU]/mL (ref <5)

## 2020-06-25 MED ORDER — ONDANSETRON 4 MG PO TBDP
4.0000 mg | ORAL_TABLET | Freq: Once | ORAL | Status: AC
  Administered 2020-06-25: 4 mg via ORAL
  Filled 2020-06-25: qty 1

## 2020-06-25 MED ORDER — ACETAMINOPHEN 325 MG PO TABS
650.0000 mg | ORAL_TABLET | Freq: Once | ORAL | Status: AC | PRN
  Administered 2020-06-25: 650 mg via ORAL
  Filled 2020-06-25: qty 2

## 2020-06-25 NOTE — ED Triage Notes (Signed)
C/O URI symptoms and has not been able to keep tylenol down. Stated has had testing for cold / flu / Covid since Saturday and rapid test comes back negative.

## 2020-06-25 NOTE — ED Notes (Signed)
Patient notified sort tech, unable to keep Tylenol given for fever,

## 2020-06-25 NOTE — ED Notes (Signed)
Pt handed me their stickers and stated that she was leaving

## 2020-06-26 ENCOUNTER — Other Ambulatory Visit: Payer: Self-pay

## 2020-06-26 ENCOUNTER — Emergency Department (HOSPITAL_COMMUNITY): Payer: BC Managed Care – PPO

## 2020-06-26 ENCOUNTER — Emergency Department (HOSPITAL_COMMUNITY)
Admission: EM | Admit: 2020-06-26 | Discharge: 2020-06-26 | Disposition: A | Discharge status: Home / Self Care | Payer: BC Managed Care – PPO | Attending: Emergency Medicine | Admitting: Emergency Medicine

## 2020-06-26 ENCOUNTER — Encounter (HOSPITAL_COMMUNITY): Payer: Self-pay | Admitting: Emergency Medicine

## 2020-06-26 DIAGNOSIS — R1031 Right lower quadrant pain: Secondary | ICD-10-CM | POA: Insufficient documentation

## 2020-06-26 DIAGNOSIS — R1012 Left upper quadrant pain: Secondary | ICD-10-CM | POA: Diagnosis not present

## 2020-06-26 DIAGNOSIS — Z20822 Contact with and (suspected) exposure to covid-19: Secondary | ICD-10-CM | POA: Insufficient documentation

## 2020-06-26 DIAGNOSIS — N760 Acute vaginitis: Secondary | ICD-10-CM | POA: Diagnosis not present

## 2020-06-26 DIAGNOSIS — R197 Diarrhea, unspecified: Secondary | ICD-10-CM | POA: Diagnosis not present

## 2020-06-26 DIAGNOSIS — R112 Nausea with vomiting, unspecified: Secondary | ICD-10-CM | POA: Insufficient documentation

## 2020-06-26 DIAGNOSIS — R509 Fever, unspecified: Secondary | ICD-10-CM | POA: Insufficient documentation

## 2020-06-26 DIAGNOSIS — B9689 Other specified bacterial agents as the cause of diseases classified elsewhere: Secondary | ICD-10-CM | POA: Diagnosis not present

## 2020-06-26 DIAGNOSIS — R1032 Left lower quadrant pain: Secondary | ICD-10-CM | POA: Insufficient documentation

## 2020-06-26 DIAGNOSIS — R519 Headache, unspecified: Secondary | ICD-10-CM

## 2020-06-26 LAB — I-STAT BETA HCG BLOOD, ED (MC, WL, AP ONLY): I-stat hCG, quantitative: <5 m[IU]/mL (ref <5)

## 2020-06-26 LAB — URINALYSIS, ROUTINE W REFLEX MICROSCOPIC
Bilirubin Urine: NEGATIVE
Glucose, UA: NEGATIVE mg/dL
Hgb urine dipstick: NEGATIVE
Ketones, ur: 20 mg/dL — AB
Leukocytes,Ua: NEGATIVE
Nitrite: NEGATIVE
Protein, ur: 30 mg/dL — AB
Specific Gravity, Urine: 1.024 (ref 1.005–1.030)
pH: 6 (ref 5.0–8.0)

## 2020-06-26 LAB — COMPREHENSIVE METABOLIC PANEL
ALT: 18 U/L (ref 0–44)
AST: 25 U/L (ref 15–41)
Albumin: 4 g/dL (ref 3.5–5.0)
Alkaline Phosphatase: 72 U/L (ref 38–126)
Anion gap: 11 (ref 5–15)
BUN: 8 mg/dL (ref 6–20)
CO2: 22 mmol/L (ref 22–32)
Calcium: 8.5 mg/dL — ABNORMAL LOW (ref 8.9–10.3)
Chloride: 99 mmol/L (ref 98–111)
Creatinine, Ser: 0.74 mg/dL (ref 0.44–1.00)
GFR, Estimated: >60 mL/min (ref >60)
Glucose, Bld: 101 mg/dL — ABNORMAL HIGH (ref 70–99)
Potassium: 4.5 mmol/L (ref 3.5–5.1)
Sodium: 132 mmol/L — ABNORMAL LOW (ref 135–145)
Total Bilirubin: 0.7 mg/dL (ref 0.3–1.2)
Total Protein: 7.3 g/dL (ref 6.5–8.1)

## 2020-06-26 LAB — RESPIRATORY PANEL BY RT PCR (FLU A&B, COVID)
Influenza A by PCR: NEGATIVE
Influenza B by PCR: NEGATIVE
SARS Coronavirus 2 by RT PCR: NEGATIVE

## 2020-06-26 LAB — WET PREP, GENITAL
Sperm: NONE SEEN
Trich, Wet Prep: NONE SEEN
Yeast Wet Prep HPF POC: NONE SEEN

## 2020-06-26 LAB — CBC WITH DIFFERENTIAL/PLATELET
Abs Immature Granulocytes: 0.13 10*3/uL — ABNORMAL HIGH (ref 0.00–0.07)
Basophils Absolute: 0 10*3/uL (ref 0.0–0.1)
Basophils Relative: 0 %
Eosinophils Absolute: 0.2 10*3/uL (ref 0.0–0.5)
Eosinophils Relative: 2 %
HCT: 42 % (ref 36.0–46.0)
Hemoglobin: 14.2 g/dL (ref 12.0–15.0)
Immature Granulocytes: 1 %
Lymphocytes Relative: 5 %
Lymphs Abs: 0.5 10*3/uL — ABNORMAL LOW (ref 0.7–4.0)
MCH: 35.8 pg — ABNORMAL HIGH (ref 26.0–34.0)
MCHC: 33.8 g/dL (ref 30.0–36.0)
MCV: 105.8 fL — ABNORMAL HIGH (ref 80.0–100.0)
Monocytes Absolute: 0.5 10*3/uL (ref 0.1–1.0)
Monocytes Relative: 5 %
Neutro Abs: 8.6 10*3/uL — ABNORMAL HIGH (ref 1.7–7.7)
Neutrophils Relative %: 87 %
Platelets: 110 10*3/uL — ABNORMAL LOW (ref 150–400)
RBC: 3.97 MIL/uL (ref 3.87–5.11)
RDW: 12.3 % (ref 11.5–15.5)
WBC: 9.9 10*3/uL (ref 4.0–10.5)
nRBC: 0 % (ref 0.0–0.2)

## 2020-06-26 LAB — LIPASE, BLOOD: Lipase: 20 U/L (ref 11–51)

## 2020-06-26 MED ORDER — DIPHENHYDRAMINE HCL 25 MG PO CAPS
25.0000 mg | ORAL_CAPSULE | Freq: Once | ORAL | Status: AC
  Administered 2020-06-26: 25 mg via ORAL
  Filled 2020-06-26: qty 1

## 2020-06-26 MED ORDER — PROMETHAZINE HCL 25 MG PO TABS: 25.0000 mg | ORAL_TABLET | Freq: Four times a day (QID) | ORAL | 0 refills | Status: DC | PRN

## 2020-06-26 MED ORDER — SODIUM CHLORIDE 0.9 % IV BOLUS
1000.0000 mL | Freq: Once | INTRAVENOUS | Status: AC
  Administered 2020-06-26: 1000 mL via INTRAVENOUS

## 2020-06-26 MED ORDER — METOCLOPRAMIDE HCL 5 MG/ML IJ SOLN
10.0000 mg | Freq: Once | INTRAMUSCULAR | Status: AC
  Administered 2020-06-26: 10 mg via INTRAVENOUS
  Filled 2020-06-26: qty 2

## 2020-06-26 MED ORDER — METRONIDAZOLE 500 MG PO TABS: 500.0000 mg | ORAL_TABLET | Freq: Two times a day (BID) | ORAL | 0 refills | Status: AC

## 2020-06-26 MED ORDER — HYDROXYZINE HCL 10 MG PO TABS
10.0000 mg | ORAL_TABLET | Freq: Once | ORAL | Status: AC
  Administered 2020-06-26: 10 mg via ORAL
  Filled 2020-06-26: qty 1

## 2020-06-26 MED ORDER — ACETAMINOPHEN 325 MG PO TABS
650.0000 mg | ORAL_TABLET | Freq: Once | ORAL | Status: AC
  Administered 2020-06-26: 650 mg via ORAL
  Filled 2020-06-26: qty 2

## 2020-06-26 MED ORDER — IOHEXOL 300 MG/ML  SOLN
100.0000 mL | Freq: Once | INTRAMUSCULAR | Status: AC | PRN
  Administered 2020-06-26: 100 mL via INTRAVENOUS

## 2020-06-26 MED ORDER — LORAZEPAM 2 MG/ML IJ SOLN
2.0000 mg | Freq: Once | INTRAMUSCULAR | Status: AC
  Administered 2020-06-26: 2 mg via INTRAVENOUS
  Filled 2020-06-26: qty 1

## 2020-06-26 MED ORDER — PROMETHAZINE HCL 25 MG/ML IJ SOLN
12.5000 mg | Freq: Once | INTRAMUSCULAR | Status: AC
  Administered 2020-06-26: 12.5 mg via INTRAVENOUS
  Filled 2020-06-26: qty 1

## 2020-06-26 MED ORDER — DIPHENHYDRAMINE HCL 50 MG/ML IJ SOLN
25.0000 mg | Freq: Once | INTRAMUSCULAR | Status: AC
  Administered 2020-06-26: 25 mg via INTRAVENOUS
  Filled 2020-06-26: qty 1

## 2020-06-26 NOTE — ED Triage Notes (Signed)
Pt state she got tested for COVID on Saturday and Flu and had a negative test for both. States she has been experiencing fever, chills, N/V/D, migraine, and body aches since Friday. States she is fully vaccinated.

## 2020-06-26 NOTE — ED Provider Notes (Signed)
[facility]-EMERGENCY DEPT Provider Note   CSN: [id number] Arrival date & time: 06/26/20  1300     History Chief Complaint  Patient presents with  . Nausea  . Emesis  . Generalized Body Aches    Marie Myers is a 29 y.o. female.  HPI 29 year old female with history of cholecystectomy, gastric bypass presents to the ER with complaints of nausea, vomiting, fevers, migraines, abdominal pain which began on Friday.  Patient states that the symptoms started when she was at work, she is a Engineer, site at [employer].  She was tested for Covid on Saturday and tested negative.  She felt a little bit better on Saturday, however on Monday started to develop similar symptoms again.  She was also tested on Tuesday for Covid and also tested negative.  She states that she went to [facility] on Monday, but left due to long wait times.  Her main complaint is her inability to hold down any food or water.  Vomitus without blood, foamy.  Diarrhea is watery, no evidence of blood.  She did start azithromycin yesterday which was given at her job, but she states that this has caused her to have a rash.  She also started prednisone this morning.  She complains of lower abdominal pain and pain that radiates up to the left side of her abdomen.  She states that every nausea medicine that she has taken she has vomited back up.  Has not been able to eat or drink for the last 4 days.  States she will have an occasional cup of wine, does endorse marijuana use which she said was on Sunday.  She denies any vaginal discharge, odors, bleeding.  She reports that she is sexually active with her fianc, they do not use protection.  She states she had some shortness of breath earlier this week but denies and CP or shortness of breath. She states she recently just finished antibiotics for a UTI as she gets them frequently due to her gastric bypass.     History reviewed. No pertinent past medical  history.  Patient Active Problem List   Diagnosis Date Noted  . Abdominal pain 08/11/2019    Past Surgical History:  Procedure Laterality Date  . CHOLECYSTECTOMY    . GASTRIC BYPASS    . LAPAROSCOPY N/A 08/11/2019   Procedure: LAPAROSCOPY DIAGNOSTIC;  Surgeon: Harriette Bouillon, MD;  Location: [facility] OR;  Service: General;  Laterality: N/A;     OB History   No obstetric history on file.     History reviewed. No pertinent family history.  Social History   Tobacco Use  . Smoking status: Not on file  Substance Use Topics  . Alcohol use: Not on file  . Drug use: Not on file    Home Medications Prior to Admission medications   Medication Sig Start Date End Date Taking? Authorizing Provider  azithromycin (ZITHROMAX) 250 MG tablet Take 250 mg by mouth See admin instructions. Zpack for 5 days. Start date : 06/25/20   Yes [provider]  predniSONE (DELTASONE) 5 MG tablet Take 5 mg by mouth See admin instructions. Take as directed on package for 6 days. Start date : 06/25/20   Yes [provider]  sucralfate (CARAFATE) 1 GM/10ML suspension Take 10 mLs (1 g total) by mouth 4 (four) times daily -  with meals and at bedtime. Patient taking differently: Take 1 g by mouth 3 (three) times daily as needed (stomach ulcer).  08/15/19  Yes Trixie DeisJohnson, Kelly R, PA-C  sulfamethoxazole-trimethoprim (BACTRIM DS) 800-160 MG tablet Take 1 tablet by mouth 2 (two) times daily. Start date : 06/20/20 06/19/20  Yes [provider]  methocarbamol (ROBAXIN-750) 750 MG tablet Take 1 tablet (750 mg total) by mouth 4 (four) times daily. Patient not taking: Reported on 06/26/2020 12/27/19   Lorre NickAllen, Anthony, MD  metoCLOPramide (REGLAN) 5 MG tablet Take 1 tablet (5 mg total) by mouth every 8 (eight) hours as needed for up to 20 days for nausea or vomiting. Patient not taking: Reported on 12/27/2019 08/15/19 09/04/19  Juliet RudeJohnson, Kelly R, PA-C  metroNIDAZOLE (FLAGYL) 500 MG tablet Take 1 tablet (500 mg  total) by mouth 2 (two) times daily for 7 days. 06/26/20 07/03/20  Mare FerrariBelaya, Travius Crochet A, PA-C  oxyCODONE-acetaminophen (PERCOCET/ROXICET) 5-325 MG tablet Take 1 tablet by mouth every 6 (six) hours as needed for moderate pain or severe pain. Patient not taking: Reported on 12/27/2019 08/15/19   Juliet RudeJohnson, Kelly R, PA-C  oxyCODONE-acetaminophen (PERCOCET/ROXICET) 5-325 MG tablet Take 2 tablets by mouth every 4 (four) hours as needed for severe pain. Patient not taking: Reported on 06/26/2020 12/27/19   Lorre NickAllen, Anthony, MD  pantoprazole (PROTONIX) 40 MG tablet Take 1 tablet (40 mg total) by mouth daily. Patient not taking: Reported on 12/27/2019 08/16/19   Juliet RudeJohnson, Kelly R, PA-C  promethazine (PHENERGAN) 25 MG tablet Take 1 tablet (25 mg total) by mouth every 6 (six) hours as needed for nausea or vomiting. 06/26/20   Mare FerrariBelaya, Adalei Novell A, PA-C    Allergies    Patient has no known allergies.  Review of Systems   Review of Systems  Constitutional: Positive for appetite change, chills and fever.  HENT: Negative for ear pain and sore throat.   Eyes: Negative for pain and visual disturbance.  Respiratory: Negative for cough and shortness of breath.   Cardiovascular: Negative for chest pain and palpitations.  Gastrointestinal: Positive for abdominal pain, diarrhea, nausea and vomiting. Negative for constipation.  Genitourinary: Negative for dysuria, hematuria, pelvic pain, vaginal bleeding, vaginal discharge and vaginal pain.  Musculoskeletal: Negative for arthralgias and back pain.  Skin: Positive for rash. Negative for color change.  Neurological: Negative for seizures and syncope.  All other systems reviewed and are negative.   Physical Exam Updated Vital Signs BP 110/66   Pulse 87   Temp 99.2 F (37.3 C)   Resp (!) 21   Ht 5\' 4"  (1.626 m)   Wt 83 kg   SpO2 100%   BMI 31.41 kg/m   Physical Exam Vitals and nursing note reviewed.  Constitutional:      General: She is not in acute distress.     Appearance: She is well-developed. She is not ill-appearing, toxic-appearing or diaphoretic.  HENT:     Head: Normocephalic and atraumatic.     Mouth/Throat:     Mouth: Mucous membranes are moist.     Pharynx: Oropharynx is clear.  Eyes:     Conjunctiva/sclera: Conjunctivae normal.  Cardiovascular:     Rate and Rhythm: Normal rate and regular rhythm.     Pulses: Normal pulses.     Heart sounds: Normal heart sounds. No murmur heard.   Pulmonary:     Effort: Pulmonary effort is normal. No respiratory distress.     Breath sounds: Normal breath sounds.  Abdominal:     General: Bowel sounds are normal.     Palpations: Abdomen is soft.     Tenderness: There is abdominal tenderness in the right lower quadrant,  epigastric area, left upper quadrant and left lower quadrant. There is left CVA tenderness. There is no right CVA tenderness, guarding or rebound. Negative signs include Murphy's sign.     Hernia: No hernia is present.  Genitourinary:    Cervix: No cervical motion tenderness, friability or erythema.     Adnexa: Right adnexa normal and left adnexa normal.       Right: No tenderness.         Left: No tenderness.       Comments: Foul-smelling odor noted on exam Musculoskeletal:        General: No tenderness or signs of injury.     Cervical back: Neck supple.     Right lower leg: No edema.     Left lower leg: No edema.  Skin:    General: Skin is warm and dry.  Neurological:     General: No focal deficit present.     Mental Status: She is alert and oriented to person, place, and time.     Sensory: No sensory deficit.     Motor: No weakness.  Psychiatric:        Mood and Affect: Mood normal.        Behavior: Behavior normal.     ED Results / Procedures / Treatments   Labs (all labs ordered are listed, but only abnormal results are displayed) Labs Reviewed  WET PREP, GENITAL - Abnormal; Notable for the following components:      Result Value   Clue Cells Wet Prep HPF POC  PRESENT (*)    WBC, Wet Prep HPF POC FEW (*)    All other components within normal limits  URINALYSIS, ROUTINE W REFLEX MICROSCOPIC - Abnormal; Notable for the following components:   Color, Urine AMBER (*)    Ketones, ur 20 (*)    Protein, ur 30 (*)    Bacteria, UA RARE (*)    All other components within normal limits  CBC WITH DIFFERENTIAL/PLATELET - Abnormal; Notable for the following components:   MCV 105.8 (*)    MCH 35.8 (*)    Platelets 110 (*)    Neutro Abs 8.6 (*)    Lymphs Abs 0.5 (*)    Abs Immature Granulocytes 0.13 (*)    All other components within normal limits  COMPREHENSIVE METABOLIC PANEL - Abnormal; Notable for the following components:   Sodium 132 (*)    Glucose, Bld 101 (*)    Calcium 8.5 (*)    All other components within normal limits  RESPIRATORY PANEL BY RT PCR (FLU A&B, COVID)  LIPASE, BLOOD  I-STAT BETA HCG BLOOD, ED ([facility], [facility], [facility] ONLY)  GC/CHLAMYDIA PROBE AMP () NOT AT [facility]    EKG None  Radiology CT ABDOMEN PELVIS W CONTRAST  Result Date: 06/26/2020 CLINICAL DATA:  Acute nonlocalized abdominal pain. Fever and nausea. Vomiting and diarrhea. Body aches. History of gastric bypass. EXAM: CT ABDOMEN AND PELVIS WITH CONTRAST TECHNIQUE: Multidetector CT imaging of the abdomen and pelvis was performed using the standard protocol following bolus administration of intravenous contrast. CONTRAST:  OMNIPAQUE IOHEXOL 300 MG/ML  SOLN COMPARISON:  CT 08/11/2019 FINDINGS: Lower chest: The lung bases are clear.  No pleural fluid. Hepatobiliary: Minimal focal fatty infiltration adjacent to the falciform ligament. Liver is prominent size spanning 18.5 cm cranial caudal. Status post cholecystectomy. No biliary dilatation. Pancreas: No ductal dilatation or inflammation. Spleen: Normal in size without focal abnormality. Adrenals/Urinary Tract: Normal adrenal glands. No hydronephrosis or perinephric edema. Homogeneous  renal enhancement. There is early excretion  of IV contrast in the renal collecting systems which appears symmetric. Urinary bladder is partially distended without wall thickening. Stomach/Bowel: Gastric bypass anatomy. Small amount of fluid in the excluded gastric remnant, diminished from prior exam. The Roux limb is nondilated. Pelvic bowel loops are fluid-filled but nondilated. There is no obstruction. There is no mesenteric swirling, bowel wall thickening or bowel inflammation. The sigmoid colon is slightly tortuous. Small volume of stool throughout the colon. The appendix is normal. Vascular/Lymphatic: Abdominal aorta is normal in caliber. The portal vein is patent. Mesenteric vessels are patent. No abdominopelvic adenopathy. Reproductive: Uterus and bilateral adnexa are unremarkable. Other: No free air, free fluid, or intra-abdominal fluid collection. Musculoskeletal: Left hip pinning. Premature spurring of the acetabula and femoral heads. There are no acute or suspicious osseous abnormalities. IMPRESSION: 1. No acute abnormality in the abdomen/pelvis. 2. Gastric bypass anatomy without complication. Previous fluid distention of the excluded gastric remnant has diminished from prior. Electronically Signed   By: Narda Rutherford M.D.   On: 06/26/2020 19:09   DG Chest Portable 1 View  Result Date: 06/26/2020 CLINICAL DATA:  Fever, chills, nausea, vomiting, diarrhea, body aches, migraine, vaccinated for COVID, negative COVID test EXAM: PORTABLE CHEST 1 VIEW COMPARISON:  Portable exam 1621 hours compared to 06/25/2020 FINDINGS: Normal heart size, mediastinal contours, and pulmonary vascularity. Lungs clear. No infiltrate, pleural effusion, or pneumothorax. Osseous structures unremarkable. IMPRESSION: No acute abnormalities. Electronically Signed   By: Ulyses Southward M.D.   On: 06/26/2020 16:35   DG Chest Portable 1 View  Result Date: 06/25/2020 CLINICAL DATA:  Cough and fever. EXAM: PORTABLE CHEST 1 VIEW COMPARISON:  12/27/2019 FINDINGS: The  cardiomediastinal contours are normal. Subsegmental opacity at the left lung base. Pulmonary vasculature is normal. No confluent consolidation, pleural effusion, or pneumothorax. No acute osseous abnormalities are seen. IMPRESSION: Subsegmental opacity at the left lung base, typically atelectasis. Early COVID pneumonia could have a similar appearance in the setting of cough and fever. Electronically Signed   By: Narda Rutherford M.D.   On: 06/25/2020 17:02    Procedures Procedures (including critical care time)  Medications Ordered in ED Medications  promethazine (PHENERGAN) injection 12.5 mg (12.5 mg Intravenous Given 06/26/20 1702)  sodium chloride 0.9 % bolus 1,000 mL (0 mLs Intravenous Stopped 06/26/20 1944)  acetaminophen (TYLENOL) tablet 650 mg (650 mg Oral Given 06/26/20 1704)  diphenhydrAMINE (BENADRYL) capsule 25 mg (25 mg Oral Given 06/26/20 1704)  metoCLOPramide (REGLAN) injection 10 mg (10 mg Intravenous Given 06/26/20 1851)  iohexol (OMNIPAQUE) 300 MG/ML solution 100 mL (100 mLs Intravenous Contrast Given 06/26/20 1835)  diphenhydrAMINE (BENADRYL) injection 25 mg (25 mg Intravenous Given 06/26/20 1850)  hydrOXYzine (ATARAX/VISTARIL) tablet 10 mg (10 mg Oral Given 06/26/20 1851)  LORazepam (ATIVAN) injection 2 mg (2 mg Intravenous Given 06/26/20 2000)    ED Course  I have reviewed the triage vital signs and the nursing notes.  Pertinent labs & imaging results that were available during my care of the patient were reviewed by me and considered in my medical decision making (see chart for details).    MDM Rules/Calculators/A&P                          29 year old female with intractable nausea and vomiting, abdominal pain, headache On presentation, she is alert, oriented, nontoxic-appearing, no acute distress.  Vitals with a borderline fever on arrival of 100.3, however not hypoxic, tachypneic or tachycardic.  Sickle exam with left lower and right lower abdominal tenderness as well as  left lateral and upper quadrant tenderness.  Pelvic exam with no cervical motion tenderness or adnexal tenderness bilaterally, though a strong odor was noted.  She has no CVA tenderness.  Her CBC shows no leukocytosis, she does have a decreased platelet count of 110, however no other abnormalities.  Her CMP shows mild hyponatremia likely in the setting of vomiting, no other electrolyte abnormalities.  Normal renal and liver function test.  Normal anion gap.  Her Covid here is negative.  Lipase is normal.  UA with evidence of dehydration, however no evidence of UTI.  Her pregnancy is negative.  Her wet prep does show some clue cells and few WBCs likely in the setting of BV.  Her chest x-ray shows no abnormalities.  Her CT of the abdomen is also unremarkable, no changes gastric bypass anatomy.  Patient received Phenergan, migraine cocktail, Tylenol, Reglan, received some Benadryl and Vistaril for itching, 1 L of fluids.  She still notes some nausea, I did give her some Ativan IV.  Patient tolerated oral fluids without difficulty.  Will send home with Phenergan, encouraged cessation of marijuana.  Will send Flagyl for BV.  Return precautions discussed.  Overall work-up was reassuring.  Low suspicion for TOA, torsion, PID as she had no adnexal tenderness or cervical motion tenderness.  CT without evidence of surgical abdomen.  At this stage in the ED course the patient is medically screened and stable for discharge. Final Clinical Impression(s) / ED Diagnoses Final diagnoses:  Nausea vomiting and diarrhea  Nonintractable headache, unspecified chronicity pattern, unspecified headache type  Bacterial vaginosis    Rx / DC Orders ED Discharge Orders         Ordered    promethazine (PHENERGAN) 25 MG tablet  Every 6 hours PRN        06/26/20 2100    metroNIDAZOLE (FLAGYL) 500 MG tablet  2 times daily        06/26/20 2103           Leone Brand 06/26/20 2104    Lorre Nick, MD 06/26/20  2336

## 2020-06-26 NOTE — Discharge Instructions (Addendum)
Your work-up today was overall reassuring.  As discussed, please refrain from smoking marijuana as this may be making her symptoms worse.  Your wet prep did show evidence of bacterial vaginosis today, I will prescribe you an antibiotic for this.  These take until finished.  Do not drink on this medication.  Please make sure to stop taking the azithromycin.  I will also prescribe you some Phenergan for nausea.  Please return to the ER for any new or worsening symptoms.

## 2020-06-27 LAB — GC/CHLAMYDIA PROBE AMP (~~LOC~~) NOT AT ARMC
Chlamydia: NEGATIVE
Comment: NEGATIVE
Comment: NORMAL
Neisseria Gonorrhea: NEGATIVE

## 2020-06-30 ENCOUNTER — Emergency Department (HOSPITAL_BASED_OUTPATIENT_CLINIC_OR_DEPARTMENT_OTHER): Payer: 59

## 2020-06-30 ENCOUNTER — Emergency Department (HOSPITAL_BASED_OUTPATIENT_CLINIC_OR_DEPARTMENT_OTHER)
Admission: EM | Admit: 2020-06-30 | Discharge: 2020-06-30 | Disposition: A | Discharge status: Home / Self Care | Payer: 59 | Attending: Emergency Medicine | Admitting: Emergency Medicine

## 2020-06-30 ENCOUNTER — Encounter (HOSPITAL_BASED_OUTPATIENT_CLINIC_OR_DEPARTMENT_OTHER): Payer: Self-pay | Admitting: Emergency Medicine

## 2020-06-30 ENCOUNTER — Other Ambulatory Visit: Payer: Self-pay

## 2020-06-30 DIAGNOSIS — R112 Nausea with vomiting, unspecified: Secondary | ICD-10-CM | POA: Diagnosis present

## 2020-06-30 DIAGNOSIS — R197 Diarrhea, unspecified: Secondary | ICD-10-CM | POA: Diagnosis not present

## 2020-06-30 DIAGNOSIS — R109 Unspecified abdominal pain: Secondary | ICD-10-CM | POA: Diagnosis not present

## 2020-06-30 LAB — CBC
HCT: 38.3 % (ref 36.0–46.0)
Hemoglobin: 13.2 g/dL (ref 12.0–15.0)
MCH: 35.6 pg — ABNORMAL HIGH (ref 26.0–34.0)
MCHC: 34.5 g/dL (ref 30.0–36.0)
MCV: 103.2 fL — ABNORMAL HIGH (ref 80.0–100.0)
Platelets: 223 10*3/uL (ref 150–400)
RBC: 3.71 MIL/uL — ABNORMAL LOW (ref 3.87–5.11)
RDW: 12.2 % (ref 11.5–15.5)
WBC: 11.9 10*3/uL — ABNORMAL HIGH (ref 4.0–10.5)
nRBC: 0 % (ref 0.0–0.2)

## 2020-06-30 LAB — URINALYSIS, ROUTINE W REFLEX MICROSCOPIC
Glucose, UA: NEGATIVE mg/dL
Hgb urine dipstick: NEGATIVE
Ketones, ur: >80 mg/dL — AB
Leukocytes,Ua: NEGATIVE
Nitrite: NEGATIVE
Protein, ur: NEGATIVE mg/dL
Specific Gravity, Urine: 1.02 (ref 1.005–1.030)
pH: 7 (ref 5.0–8.0)

## 2020-06-30 LAB — COMPREHENSIVE METABOLIC PANEL
ALT: 24 U/L (ref 0–44)
AST: 36 U/L (ref 15–41)
Albumin: 4.1 g/dL (ref 3.5–5.0)
Alkaline Phosphatase: 61 U/L (ref 38–126)
Anion gap: 15 (ref 5–15)
BUN: 7 mg/dL (ref 6–20)
CO2: 22 mmol/L (ref 22–32)
Calcium: 9 mg/dL (ref 8.9–10.3)
Chloride: 101 mmol/L (ref 98–111)
Creatinine, Ser: 0.59 mg/dL (ref 0.44–1.00)
GFR, Estimated: >60 mL/min (ref >60)
Glucose, Bld: 120 mg/dL — ABNORMAL HIGH (ref 70–99)
Potassium: 3.8 mmol/L (ref 3.5–5.1)
Sodium: 138 mmol/L (ref 135–145)
Total Bilirubin: 0.6 mg/dL (ref 0.3–1.2)
Total Protein: 7.5 g/dL (ref 6.5–8.1)

## 2020-06-30 LAB — PREGNANCY, URINE: Preg Test, Ur: NEGATIVE

## 2020-06-30 LAB — LIPASE, BLOOD: Lipase: 20 U/L (ref 11–51)

## 2020-06-30 MED ORDER — PROMETHAZINE HCL 25 MG/ML IJ SOLN
25.0000 mg | Freq: Once | INTRAMUSCULAR | Status: AC
  Administered 2020-06-30: 25 mg via INTRAVENOUS
  Filled 2020-06-30: qty 1

## 2020-06-30 MED ORDER — IOHEXOL 300 MG/ML  SOLN
100.0000 mL | Freq: Once | INTRAMUSCULAR | Status: AC | PRN
  Administered 2020-06-30: 100 mL via INTRAVENOUS

## 2020-06-30 MED ORDER — HALOPERIDOL LACTATE 5 MG/ML IJ SOLN
5.0000 mg | Freq: Once | INTRAMUSCULAR | Status: AC
  Administered 2020-06-30: 5 mg via INTRAVENOUS
  Filled 2020-06-30: qty 1

## 2020-06-30 MED ORDER — SODIUM CHLORIDE 0.9 % IV BOLUS
1000.0000 mL | Freq: Once | INTRAVENOUS | Status: AC
  Administered 2020-06-30: 1000 mL via INTRAVENOUS

## 2020-06-30 MED ORDER — PROMETHAZINE HCL 25 MG RE SUPP: 25.0000 mg | Freq: Three times a day (TID) | RECTAL | 0 refills | Status: DC | PRN

## 2020-06-30 MED ORDER — DICYCLOMINE HCL 20 MG PO TABS: 20.0000 mg | ORAL_TABLET | Freq: Two times a day (BID) | ORAL | 0 refills | Status: DC | PRN

## 2020-06-30 MED ORDER — MORPHINE SULFATE (PF) 4 MG/ML IV SOLN
4.0000 mg | Freq: Once | INTRAVENOUS | Status: AC
  Administered 2020-06-30: 4 mg via INTRAVENOUS
  Filled 2020-06-30: qty 1

## 2020-06-30 MED ORDER — ONDANSETRON HCL 4 MG/2ML IJ SOLN
4.0000 mg | Freq: Once | INTRAMUSCULAR | Status: AC
  Administered 2020-06-30: 4 mg via INTRAVENOUS
  Filled 2020-06-30: qty 2

## 2020-06-30 NOTE — Discharge Instructions (Signed)
Continue taking home medications as prescribed.  Use tylenol as needed for pain.  Take bentyl as needed for abdominal pain and cramping.  Use phenergan as needed for nausea. You may use the suppositories as needed if you have active vomiting.  Eat a bland diet until symptoms improve.  Follow up with your primary care doctor as needed for further evaluation.  Return to the ER if you develop fevers, persistent vomiting, severe worsening pain, or any new, worsening, or concerning symptoms.

## 2020-06-30 NOTE — ED Triage Notes (Signed)
Reports diffuse abdominal pain with n/v all day today.  Also reports chest hurts as well.  Hx of gastric bypass with nausea at times.  Was unable to keep phenergan down.

## 2020-06-30 NOTE — ED Provider Notes (Signed)
[facility] EMERGENCY DEPARTMENT Provider Note   CSN: [id number] Arrival date & time: 06/30/20  1938     History Chief Complaint  Patient presents with  . Abdominal Pain    Marie Myers is a 29 y.o. female presenting for evaluation of abdominal pain.  Patient states she had acute onset mid abdominal pain around 9:00 this morning.  Since then, she has had many episodes of emesis, nonbloody nonbilious.  She also reports 2-3 bowel movements, nonbloody.  She denies fevers, chills, cough, urinary symptoms.  She denies sick contacts.  She tried to take her home Phenergan, but was unable to keep it down due to vomiting.  She has a history of gastric bypass surgery.  She reports a history of previous bowel obstruction, states this feels similar.  She has not taken anything for pain.  HPI     History reviewed. No pertinent past medical history.  Patient Active Problem List   Diagnosis Date Noted  . Abdominal pain 08/11/2019    Past Surgical History:  Procedure Laterality Date  . CHOLECYSTECTOMY    . GASTRIC BYPASS    . LAPAROSCOPY N/A 08/11/2019   Procedure: LAPAROSCOPY DIAGNOSTIC;  Surgeon: Harriette Bouillon, MD;  Location: [facility] OR;  Service: General;  Laterality: N/A;     OB History   No obstetric history on file.     No family history on file.  Social History   Tobacco Use  . Smoking status: Never Smoker  . Smokeless tobacco: Never Used  Substance Use Topics  . Alcohol use: Yes    Comment: occasionally  . Drug use: Never    Home Medications Prior to Admission medications   Medication Sig Start Date End Date Taking? Authorizing Provider  azithromycin (ZITHROMAX) 250 MG tablet Take 250 mg by mouth See admin instructions. Zpack for 5 days. Start date : 06/25/20    [provider]  dicyclomine (BENTYL) 20 MG tablet Take 1 tablet (20 mg total) by mouth 2 (two) times daily as needed for spasms. 06/30/20   Dessie Delcarlo, PA-C  methocarbamol  (ROBAXIN-750) 750 MG tablet Take 1 tablet (750 mg total) by mouth 4 (four) times daily. Patient not taking: Reported on 06/26/2020 12/27/19   Lorre Nick, MD  metoCLOPramide (REGLAN) 5 MG tablet Take 1 tablet (5 mg total) by mouth every 8 (eight) hours as needed for up to 20 days for nausea or vomiting. Patient not taking: Reported on 12/27/2019 08/15/19 09/04/19  Juliet Rude, PA-C  metroNIDAZOLE (FLAGYL) 500 MG tablet Take 1 tablet (500 mg total) by mouth 2 (two) times daily for 7 days. 06/26/20 07/03/20  Mare Ferrari, PA-C  oxyCODONE-acetaminophen (PERCOCET/ROXICET) 5-325 MG tablet Take 1 tablet by mouth every 6 (six) hours as needed for moderate pain or severe pain. Patient not taking: Reported on 12/27/2019 08/15/19   Juliet Rude, PA-C  oxyCODONE-acetaminophen (PERCOCET/ROXICET) 5-325 MG tablet Take 2 tablets by mouth every 4 (four) hours as needed for severe pain. Patient not taking: Reported on 06/26/2020 12/27/19   Lorre Nick, MD  pantoprazole (PROTONIX) 40 MG tablet Take 1 tablet (40 mg total) by mouth daily. Patient not taking: Reported on 12/27/2019 08/16/19   Juliet Rude, PA-C  predniSONE (DELTASONE) 5 MG tablet Take 5 mg by mouth See admin instructions. Take as directed on package for 6 days. Start date : 06/25/20    [provider]  promethazine (PHENERGAN) 25 MG suppository Place 1 suppository (25 mg total) rectally every 8 (eight) hours  as needed for nausea or vomiting. 06/30/20   Tajah Noguchi, PA-C  promethazine (PHENERGAN) 25 MG tablet Take 1 tablet (25 mg total) by mouth every 6 (six) hours as needed for nausea or vomiting. 06/26/20   Mare Ferrari, PA-C  sucralfate (CARAFATE) 1 GM/10ML suspension Take 10 mLs (1 g total) by mouth 4 (four) times daily -  with meals and at bedtime. Patient taking differently: Take 1 g by mouth 3 (three) times daily as needed (stomach ulcer).  08/15/19   Juliet Rude, PA-C  sulfamethoxazole-trimethoprim (BACTRIM DS) 800-160  MG tablet Take 1 tablet by mouth 2 (two) times daily. Start date : 06/20/20 06/19/20   [provider]    Allergies    Zithromax [azithromycin]  Review of Systems   Review of Systems  Gastrointestinal: Positive for abdominal pain, diarrhea, nausea and vomiting.  All other systems reviewed and are negative.   Physical Exam Updated Vital Signs BP (!) 174/103   Pulse 75   Temp 98 F (36.7 C) (Oral)   Resp (!) 22   Ht 5\' 4"  (1.626 m)   Wt 83 kg   SpO2 100%   BMI 31.41 kg/m   Physical Exam Vitals and nursing note reviewed.  Constitutional:      General: She is not in acute distress.    Appearance: She is well-developed.     Comments: Appears uncomfortable due to pain.   HENT:     Head: Normocephalic and atraumatic.  Eyes:     Conjunctiva/sclera: Conjunctivae normal.     Pupils: Pupils are equal, round, and reactive to light.  Cardiovascular:     Rate and Rhythm: Normal rate and regular rhythm.     Pulses: Normal pulses.  Pulmonary:     Effort: Pulmonary effort is normal. No respiratory distress.     Breath sounds: Normal breath sounds. No wheezing.  Abdominal:     General: There is no distension.     Palpations: Abdomen is soft. There is no mass.     Tenderness: There is abdominal tenderness. There is no guarding or rebound.     Comments: Diffuse ttp of the abd. Dry heaving in the room  Musculoskeletal:        General: Normal range of motion.     Cervical back: Normal range of motion and neck supple.  Skin:    General: Skin is warm and dry.     Capillary Refill: Capillary refill takes less than 2 seconds.  Neurological:     Mental Status: She is alert and oriented to person, place, and time.     ED Results / Procedures / Treatments   Labs (all labs ordered are listed, but only abnormal results are displayed) Labs Reviewed  COMPREHENSIVE METABOLIC PANEL - Abnormal; Notable for the following components:      Result Value   Glucose, Bld 120 (*)    All  other components within normal limits  CBC - Abnormal; Notable for the following components:   WBC 11.9 (*)    RBC 3.71 (*)    MCV 103.2 (*)    MCH 35.6 (*)    All other components within normal limits  URINALYSIS, ROUTINE W REFLEX MICROSCOPIC - Abnormal; Notable for the following components:   Color, Urine AMBER (*)    APPearance CLOUDY (*)    Bilirubin Urine SMALL (*)    Ketones, ur >80 (*)    All other components within normal limits  LIPASE, BLOOD  PREGNANCY, URINE  EKG None  Radiology CT ABDOMEN PELVIS W CONTRAST  Result Date: 06/30/2020 CLINICAL DATA:  Diffuse abdominal pain.  History of gastric bypass. EXAM: CT ABDOMEN AND PELVIS WITH CONTRAST TECHNIQUE: Multidetector CT imaging of the abdomen and pelvis was performed using the standard protocol following bolus administration of intravenous contrast. CONTRAST:  OMNIPAQUE IOHEXOL 300 MG/ML  SOLN COMPARISON:  June 26, 2020 FINDINGS: Lower chest: The lung bases are clear. The heart size is normal. Hepatobiliary: The liver is normal. Normal gallbladder.There is no biliary ductal dilation. Pancreas: Normal contours without ductal dilatation. No peripancreatic fluid collection. Spleen: Unremarkable. Adrenals/Urinary Tract: --Adrenal glands: Unremarkable. --Right kidney/ureter: No hydronephrosis or radiopaque kidney stones. --Left kidney/ureter: No hydronephrosis or radiopaque kidney stones. --Urinary bladder: Unremarkable. Stomach/Bowel: --Stomach/Duodenum: The patient is status post prior gastric bypass. --Small bowel: Unremarkable. --Colon: Unremarkable. --Appendix: Normal. Vascular/Lymphatic: Normal course and caliber of the major abdominal vessels. --No retroperitoneal lymphadenopathy. --No mesenteric lymphadenopathy. --No pelvic or inguinal lymphadenopathy. Reproductive: Unremarkable Other: No ascites or free air. The abdominal wall is normal. Musculoskeletal. No acute displaced fractures. IMPRESSION: 1. No acute  abdominopelvic abnormality. 2. Status post prior gastric bypass. Electronically Signed   By: Katherine Mantle M.D.   On: 06/30/2020 22:59    Procedures Procedures (including critical care time)  Medications Ordered in ED Medications  ondansetron (ZOFRAN) injection 4 mg (4 mg Intravenous Given 06/30/20 2131)  morphine 4 MG/ML injection 4 mg (4 mg Intravenous Given 06/30/20 2132)  sodium chloride 0.9 % bolus 1,000 mL (1,000 mLs Intravenous New Bag/Given 06/30/20 2232)  promethazine (PHENERGAN) injection 25 mg (25 mg Intravenous Given 06/30/20 2238)  iohexol (OMNIPAQUE) 300 MG/ML solution 100 mL (100 mLs Intravenous Contrast Given 06/30/20 2242)  haloperidol lactate (HALDOL) injection 5 mg (5 mg Intravenous Given 06/30/20 2310)    ED Course  I have reviewed the triage vital signs and the nursing notes.  Pertinent labs & imaging results that were available during my care of the patient were reviewed by me and considered in my medical decision making (see chart for details).    MDM Rules/Calculators/A&P                          Patient presenting for evaluation of nausea, vomiting, diarrhea, abdominal pain.  On exam, patient appears uncomfortable.  She is dry heaving in the room.  She has diffuse tenderness palpation of the abdomen.  She does have a slightly more complex medical history and that she has had gastric bypass and previous history of bowel obstruction.  She will need labs and CT imaging for further evaluation.  If negative, likely viral GI illness and can be treated symptomatically.  On reassessment, patient reports pain is mildly improved with morphine, however she continues to have nausea.  Will give Phenergan.  Labs interpreted by me, overall reassuring.  Leukocytosis.  Kidney, liver, pancreatic function normal.  CT pending.  CT negative for acute findings.  Patient reports continued severe abdominal pain, nausea is improved.  Give Haldol.  On reevaluation, nausea and pain is  improved.  Discussed findings with patient.  Discussed likely viral GI illness.  Discussed symptomatic treatment and follow-up with PCP as needed.  Patient given p.o. in the ED, tolerated without difficulty.  At this time, patient appears safe for discharge.  Return precautions given.  Patient states she understands and agrees to plan.  Final Clinical Impression(s) / ED Diagnoses Final diagnoses:  Nausea vomiting and diarrhea  Acute abdominal pain  Rx / DC Orders ED Discharge Orders         Ordered    promethazine (PHENERGAN) 25 MG suppository  Every 8 hours PRN        06/30/20 2322    dicyclomine (BENTYL) 20 MG tablet  2 times daily PRN        06/30/20 2322           Alveria ApleyCaccavale, Ally Knodel, PA-C 06/30/20 2336    Pricilla LovelessGoldston, Scott, MD 07/01/20 [phone]

## 2020-06-30 NOTE — ED Notes (Signed)
Pt tolerated PO challenge; EDP made aware 

## 2020-06-30 NOTE — ED Notes (Signed)
Attempted to obtain IV access x 2 without success; labs obtained at this time

## 2020-06-30 NOTE — ED Notes (Signed)
Pt to CT

## 2020-07-04 ENCOUNTER — Emergency Department (HOSPITAL_COMMUNITY)
Admission: EM | Admit: 2020-07-04 | Discharge: 2020-07-04 | Disposition: A | Discharge status: Home / Self Care | Payer: 59 | Attending: Emergency Medicine | Admitting: Emergency Medicine

## 2020-07-04 ENCOUNTER — Other Ambulatory Visit: Payer: Self-pay

## 2020-07-04 ENCOUNTER — Encounter (HOSPITAL_COMMUNITY): Payer: Self-pay

## 2020-07-04 DIAGNOSIS — R197 Diarrhea, unspecified: Secondary | ICD-10-CM | POA: Diagnosis not present

## 2020-07-04 DIAGNOSIS — Z5321 Procedure and treatment not carried out due to patient leaving prior to being seen by health care provider: Secondary | ICD-10-CM | POA: Insufficient documentation

## 2020-07-04 DIAGNOSIS — R112 Nausea with vomiting, unspecified: Secondary | ICD-10-CM | POA: Insufficient documentation

## 2020-07-04 DIAGNOSIS — R109 Unspecified abdominal pain: Secondary | ICD-10-CM | POA: Insufficient documentation

## 2020-07-04 LAB — CBC
HCT: 37.8 % (ref 36.0–46.0)
Hemoglobin: 12.7 g/dL (ref 12.0–15.0)
MCH: 35.9 pg — ABNORMAL HIGH (ref 26.0–34.0)
MCHC: 33.6 g/dL (ref 30.0–36.0)
MCV: 106.8 fL — ABNORMAL HIGH (ref 80.0–100.0)
Platelets: 454 10*3/uL — ABNORMAL HIGH (ref 150–400)
RBC: 3.54 MIL/uL — ABNORMAL LOW (ref 3.87–5.11)
RDW: 12.3 % (ref 11.5–15.5)
WBC: 11.5 10*3/uL — ABNORMAL HIGH (ref 4.0–10.5)
nRBC: 0 % (ref 0.0–0.2)

## 2020-07-04 LAB — COMPREHENSIVE METABOLIC PANEL
ALT: 40 U/L (ref 0–44)
AST: 43 U/L — ABNORMAL HIGH (ref 15–41)
Albumin: 3.8 g/dL (ref 3.5–5.0)
Alkaline Phosphatase: 57 U/L (ref 38–126)
Anion gap: 10 (ref 5–15)
BUN: 6 mg/dL (ref 6–20)
CO2: 25 mmol/L (ref 22–32)
Calcium: 8.5 mg/dL — ABNORMAL LOW (ref 8.9–10.3)
Chloride: 107 mmol/L (ref 98–111)
Creatinine, Ser: 0.51 mg/dL (ref 0.44–1.00)
GFR, Estimated: >60 mL/min (ref >60)
Glucose, Bld: 92 mg/dL (ref 70–99)
Potassium: 4.2 mmol/L (ref 3.5–5.1)
Sodium: 142 mmol/L (ref 135–145)
Total Bilirubin: 0.4 mg/dL (ref 0.3–1.2)
Total Protein: 6.9 g/dL (ref 6.5–8.1)

## 2020-07-04 LAB — I-STAT BETA HCG BLOOD, ED (MC, WL, AP ONLY): I-stat hCG, quantitative: <5 m[IU]/mL (ref <5)

## 2020-07-04 LAB — LIPASE, BLOOD: Lipase: 24 U/L (ref 11–51)

## 2020-07-04 NOTE — ED Triage Notes (Signed)
Patient reports gastric bypass in 2017 and since has been having GI issues.   Pt c/o intermittent abdominal pain and emesis X1.5 weeks.   Today abdominal pain started 1 hour ago and constant emesis per patient.    10/10 pain  A/ox4

## 2020-07-06 ENCOUNTER — Emergency Department (HOSPITAL_COMMUNITY)
Admission: EM | Admit: 2020-07-06 | Discharge: 2020-07-07 | Disposition: A | Discharge status: Home / Self Care | Payer: BC Managed Care – PPO | Attending: Emergency Medicine | Admitting: Emergency Medicine

## 2020-07-06 ENCOUNTER — Other Ambulatory Visit: Payer: Self-pay

## 2020-07-06 DIAGNOSIS — R112 Nausea with vomiting, unspecified: Secondary | ICD-10-CM | POA: Diagnosis not present

## 2020-07-06 DIAGNOSIS — R1084 Generalized abdominal pain: Secondary | ICD-10-CM | POA: Insufficient documentation

## 2020-07-06 LAB — COMPREHENSIVE METABOLIC PANEL
ALT: 24 U/L (ref 0–44)
AST: 17 U/L (ref 15–41)
Albumin: 3.5 g/dL (ref 3.5–5.0)
Alkaline Phosphatase: 49 U/L (ref 38–126)
Anion gap: 16 — ABNORMAL HIGH (ref 5–15)
BUN: 7 mg/dL (ref 6–20)
CO2: 23 mmol/L (ref 22–32)
Calcium: 8.8 mg/dL — ABNORMAL LOW (ref 8.9–10.3)
Chloride: 107 mmol/L (ref 98–111)
Creatinine, Ser: 0.7 mg/dL (ref 0.44–1.00)
GFR, Estimated: >60 mL/min (ref >60)
Glucose, Bld: 79 mg/dL (ref 70–99)
Potassium: 3.9 mmol/L (ref 3.5–5.1)
Sodium: 146 mmol/L — ABNORMAL HIGH (ref 135–145)
Total Bilirubin: 0.9 mg/dL (ref 0.3–1.2)
Total Protein: 6.3 g/dL — ABNORMAL LOW (ref 6.5–8.1)

## 2020-07-06 LAB — URINALYSIS, ROUTINE W REFLEX MICROSCOPIC
Bilirubin Urine: NEGATIVE
Glucose, UA: NEGATIVE mg/dL
Hgb urine dipstick: NEGATIVE
Ketones, ur: 5 mg/dL — AB
Leukocytes,Ua: NEGATIVE
Nitrite: POSITIVE — AB
Protein, ur: NEGATIVE mg/dL
Specific Gravity, Urine: 1.015 (ref 1.005–1.030)
pH: 7 (ref 5.0–8.0)

## 2020-07-06 LAB — LIPASE, BLOOD: Lipase: 23 U/L (ref 11–51)

## 2020-07-06 LAB — CBC
HCT: 38.9 % (ref 36.0–46.0)
Hemoglobin: 12.9 g/dL (ref 12.0–15.0)
MCH: 35.6 pg — ABNORMAL HIGH (ref 26.0–34.0)
MCHC: 33.2 g/dL (ref 30.0–36.0)
MCV: 107.5 fL — ABNORMAL HIGH (ref 80.0–100.0)
Platelets: 506 10*3/uL — ABNORMAL HIGH (ref 150–400)
RBC: 3.62 MIL/uL — ABNORMAL LOW (ref 3.87–5.11)
RDW: 12.8 % (ref 11.5–15.5)
WBC: 11.2 10*3/uL — ABNORMAL HIGH (ref 4.0–10.5)
nRBC: 0 % (ref 0.0–0.2)

## 2020-07-06 LAB — I-STAT BETA HCG BLOOD, ED (MC, WL, AP ONLY): I-stat hCG, quantitative: <5 m[IU]/mL (ref <5)

## 2020-07-06 MED ORDER — FENTANYL CITRATE (PF) 100 MCG/2ML IJ SOLN
50.0000 ug | Freq: Once | INTRAMUSCULAR | Status: AC
  Administered 2020-07-06: 50 ug via INTRAVENOUS
  Filled 2020-07-06: qty 2

## 2020-07-06 MED ORDER — PROMETHAZINE HCL 25 MG/ML IJ SOLN
12.5000 mg | Freq: Once | INTRAMUSCULAR | Status: AC
  Administered 2020-07-06: 12.5 mg via INTRAVENOUS
  Filled 2020-07-06: qty 1

## 2020-07-06 MED ORDER — ONDANSETRON 4 MG PO TBDP: 4.0000 mg | ORAL_TABLET | Freq: Once | ORAL | Status: DC | PRN

## 2020-07-06 MED ORDER — SODIUM CHLORIDE 0.9 % IV BOLUS
1000.0000 mL | Freq: Once | INTRAVENOUS | Status: AC
  Administered 2020-07-06: 1000 mL via INTRAVENOUS

## 2020-07-06 NOTE — ED Provider Notes (Signed)
[facility]-EMERGENCY DEPT Provider Note   CSN: [id number] Arrival date & time: 07/06/20  1726     History Chief Complaint  Patient presents with  . Emesis    Marie Myers is a 29 y.o. female.  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  Presents to ER with concern for abdominal pain, vomiting.  Reports that she has had episodes similar to this previously.  Current episode has been ongoing for the past 10 days.  States that she gets extremely nauseated and has abdominal pain every time she tries to eat anything.  Pain is currently 10 out of 10 sharp, stabbing, generalized abdomen.  Vomit is nonbloody nonbilious.  Pain and nausea today very similar to recent ER visits.  Per chart review ER visit on 11/3 and 11/7.  CT scan x2 within normal limits.  Gastric bypass anatomy was without complication.  States she does not have any local doctors as she recently moved to the area.  But says she has been referred to gastroenterology though she has not seen them yet.  HPI     No past medical history on file.  Patient Active Problem List   Diagnosis Date Noted  . Abdominal pain 08/11/2019    Past Surgical History:  Procedure Laterality Date  . CHOLECYSTECTOMY    . GASTRIC BYPASS    . LAPAROSCOPY N/A 08/11/2019   Procedure: LAPAROSCOPY DIAGNOSTIC;  Surgeon: Harriette Bouillon, MD;  Location: [facility] OR;  Service: General;  Laterality: N/A;     OB History   No obstetric history on file.     No family history on file.  Social History   Tobacco Use  . Smoking status: Never Smoker  . Smokeless tobacco: Never Used  Substance Use Topics  . Alcohol use: Yes    Comment: occasionally  . Drug use: Never    Home Medications Prior to Admission medications   Medication Sig Start Date End Date Taking? Authorizing Provider  omeprazole (PRILOSEC) 20 MG capsule Take 20 mg by mouth daily.   Yes [provider]  promethazine (PHENERGAN) 25 MG tablet Take 1  tablet (25 mg total) by mouth every 6 (six) hours as needed for nausea or vomiting. 06/26/20  Yes Mare Ferrari, PA-C  dicyclomine (BENTYL) 20 MG tablet Take 1 tablet (20 mg total) by mouth 2 (two) times daily as needed for spasms. Patient not taking: Reported on 07/06/2020 06/30/20   Caccavale, Sophia, PA-C  methocarbamol (ROBAXIN-750) 750 MG tablet Take 1 tablet (750 mg total) by mouth 4 (four) times daily. Patient not taking: Reported on 06/26/2020 12/27/19   Lorre Nick, MD  metoCLOPramide (REGLAN) 5 MG tablet Take 1 tablet (5 mg total) by mouth every 8 (eight) hours as needed for up to 20 days for nausea or vomiting. Patient not taking: Reported on 12/27/2019 08/15/19 09/04/19  Juliet Rude, PA-C  oxyCODONE-acetaminophen (PERCOCET/ROXICET) 5-325 MG tablet Take 1 tablet by mouth every 6 (six) hours as needed for moderate pain or severe pain. Patient not taking: Reported on 12/27/2019 08/15/19   Juliet Rude, PA-C  oxyCODONE-acetaminophen (PERCOCET/ROXICET) 5-325 MG tablet Take 2 tablets by mouth every 4 (four) hours as needed for severe pain. Patient not taking: Reported on 06/26/2020 12/27/19   Lorre Nick, MD  pantoprazole (PROTONIX) 40 MG tablet Take 1 tablet (40 mg total) by mouth daily. Patient not taking: Reported on 12/27/2019 08/16/19   Juliet Rude, PA-C  promethazine (PHENERGAN) 25 MG suppository Place 1 suppository (25  mg total) rectally every 8 (eight) hours as needed for nausea or vomiting. 06/30/20   Caccavale, Sophia, PA-C  sucralfate (CARAFATE) 1 GM/10ML suspension Take 10 mLs (1 g total) by mouth 4 (four) times daily -  with meals and at bedtime. Patient not taking: Reported on 07/06/2020 08/15/19   Juliet Rude, PA-C    Allergies    Zithromax [azithromycin]  Review of Systems   Review of Systems  Constitutional: Negative for chills and fever.  HENT: Negative for ear pain and sore throat.   Eyes: Negative for pain and visual disturbance.  Respiratory: Negative  for cough and shortness of breath.   Cardiovascular: Negative for chest pain and palpitations.  Gastrointestinal: Positive for abdominal pain, nausea and vomiting.  Genitourinary: Negative for dysuria and hematuria.  Musculoskeletal: Negative for arthralgias and back pain.  Skin: Negative for color change and rash.  Neurological: Negative for seizures and syncope.  All other systems reviewed and are negative.   Physical Exam Updated Vital Signs BP (!) 133/96 (BP Location: Left Arm)   Pulse 76   Temp 98.1 F (36.7 C) (Oral)   Resp 16   Ht 5\' 4"  (1.626 m)   Wt 81.6 kg   LMP 06/28/2020   SpO2 95%   BMI 30.90 kg/m   Physical Exam Vitals and nursing note reviewed.  Constitutional:      General: She is not in acute distress.    Appearance: She is well-developed.  HENT:     Head: Normocephalic and atraumatic.  Eyes:     Conjunctiva/sclera: Conjunctivae normal.  Cardiovascular:     Rate and Rhythm: Normal rate and regular rhythm.     Heart sounds: No murmur heard.   Pulmonary:     Effort: Pulmonary effort is normal. No respiratory distress.     Breath sounds: Normal breath sounds.  Abdominal:     Palpations: Abdomen is soft.     Tenderness: There is no guarding or rebound.     Comments: Soft, there is generalized tenderness, worse in epigastric  Musculoskeletal:        General: No deformity or signs of injury.     Cervical back: Neck supple.  Skin:    General: Skin is warm and dry.  Neurological:     General: No focal deficit present.     Mental Status: She is alert.  Psychiatric:        Mood and Affect: Mood normal.     ED Results / Procedures / Treatments   Labs (all labs ordered are listed, but only abnormal results are displayed) Labs Reviewed  LIPASE, BLOOD  COMPREHENSIVE METABOLIC PANEL  CBC  URINALYSIS, ROUTINE W REFLEX MICROSCOPIC  I-STAT BETA HCG BLOOD, ED ([facility], [facility], [facility] ONLY)    EKG EKG Interpretation  Date/Time:  Saturday July 06 2020  18:04:40 EST Ventricular Rate:  82 PR Interval:    QRS Duration: 79 QT Interval:  384 QTC Calculation: 449 R Axis:   22 Text Interpretation: Sinus rhythm Low voltage, precordial leads 12 Lead; Mason-Likar No significant change since prior 11/21 Confirmed by 12/21 [phone]) on 07/06/2020 9:38:49 PM   Radiology No results found.  Procedures Procedures (including critical care time)  Medications Ordered in ED Medications  promethazine (PHENERGAN) injection 12.5 mg (has no administration in time range)  sodium chloride 0.9 % bolus 1,000 mL (has no administration in time range)  fentaNYL (SUBLIMAZE) injection 50 mcg (has no administration in time range)    ED Course  I have reviewed the triage vital signs and the nursing notes.  Pertinent labs & imaging results that were available during my care of the patient were reviewed by me and considered in my medical decision making (see chart for details).    MDM Rules/Calculators/A&P                         29 year old lady presenting to the ER with concern for nausea, vomiting, abdominal pain.  Notable medical history of Roux-en-Y gastric bypass.  On exam well-appearing no distress, abdomen is soft but she does have some tenderness in her epigastric region.  CT scan x2 recently for similar symptoms were negative.  Low suspicion for acute abdominal process.  Will check basic labs, provide fluids and symptomatic control.  While awaiting labs and reassessment, will sign out to Dr. Eudelia Bunch.   Final Clinical Impression(s) / ED Diagnoses Final diagnoses:  Generalized abdominal pain    Rx / DC Orders ED Discharge Orders    None       Milagros Loll, MD 07/06/20 2237

## 2020-07-06 NOTE — ED Triage Notes (Signed)
Patient reports she has been vomiting for 10 days now. States she had gastric bypass in 2017 and this cycle of vomiting happens every year. Patient reports she was supposed to have endoscopy last year but did not happen. Writer offered patient zofran, patient declined, says zofran does not work

## 2020-07-06 NOTE — ED Notes (Signed)
Attempted lab draw x 2 but unsuccessful, RN, Ilene made aware.

## 2020-07-07 LAB — RAPID URINE DRUG SCREEN, HOSP PERFORMED
Amphetamines: NOT DETECTED
Barbiturates: NOT DETECTED
Benzodiazepines: NOT DETECTED
Cocaine: NOT DETECTED
Opiates: NOT DETECTED
Tetrahydrocannabinol: POSITIVE — AB

## 2020-07-07 MED ORDER — SUCRALFATE 1 GM/10ML PO SUSP
1.0000 g | Freq: Once | ORAL | Status: AC
  Administered 2020-07-07: 1 g via ORAL
  Filled 2020-07-07: qty 10

## 2020-07-07 MED ORDER — PROMETHAZINE HCL 25 MG RE SUPP
25.0000 mg | Freq: Once | RECTAL | Status: AC
  Administered 2020-07-07: 25 mg via RECTAL
  Filled 2020-07-07: qty 1

## 2020-07-07 MED ORDER — LIDOCAINE VISCOUS HCL 2 % MT SOLN: 15.0000 mL | Freq: Four times a day (QID) | OROMUCOSAL | 0 refills | Status: DC | PRN

## 2020-07-07 MED ORDER — ACETAMINOPHEN 650 MG RE SUPP
650.0000 mg | Freq: Once | RECTAL | Status: AC
  Administered 2020-07-07: 650 mg via RECTAL
  Filled 2020-07-07: qty 1

## 2020-07-07 MED ORDER — LIDOCAINE VISCOUS HCL 2 % MT SOLN
15.0000 mL | Freq: Once | OROMUCOSAL | Status: AC
  Administered 2020-07-07: 15 mL via ORAL
  Filled 2020-07-07: qty 15

## 2020-07-07 MED ORDER — PROMETHAZINE HCL 25 MG RE SUPP: 25.0000 mg | Freq: Four times a day (QID) | RECTAL | 0 refills | Status: DC | PRN

## 2020-07-07 MED ORDER — HYOSCYAMINE SULFATE 0.125 MG SL SUBL
0.2500 mg | SUBLINGUAL_TABLET | Freq: Once | SUBLINGUAL | Status: AC
  Administered 2020-07-07: 0.25 mg via SUBLINGUAL
  Filled 2020-07-07: qty 2

## 2020-07-07 MED ORDER — SUCRALFATE 1 GM/10ML PO SUSP: 1.0000 g | Freq: Three times a day (TID) | ORAL | 0 refills | Status: DC

## 2020-07-07 MED ORDER — METOCLOPRAMIDE HCL 5 MG/ML IJ SOLN
10.0000 mg | Freq: Once | INTRAMUSCULAR | Status: AC
  Administered 2020-07-07: 10 mg via INTRAVENOUS
  Filled 2020-07-07: qty 2

## 2020-07-07 MED ORDER — ALUM & MAG HYDROXIDE-SIMETH 200-200-20 MG/5ML PO SUSP
30.0000 mL | Freq: Once | ORAL | Status: AC
  Administered 2020-07-07: 30 mL via ORAL
  Filled 2020-07-07: qty 30

## 2020-07-07 NOTE — ED Provider Notes (Signed)
I assumed care of this patient.  Please see previous provider note for further details of Hx, PE.  Briefly patient is a 29 y.o. female who presented with recurrent abdominal pain and reported nausea/vomiting with oral intolerance.  History of Roux-en-Y.  Seen recently for same presentation and has had 2 - CT scans.  Current plan is to await labs and reassess.  CBC with improving leukocytosis.  Metabolic panel without significant electrolyte derangement or renal sufficiency.  While awaiting labs, patient complained of persistent pain.  She was provided with Reglan and GI cocktail.  On reassessment patient was found to be resting comfortably in bed.  Still complains persistent pain.  Provided with Carafate.   UA with positive nitrites but patient denies any urinary symptoms.  Will send for culture. UDS was ordered and positive for THC.  Patient reports that she only smokes intermittently and last smoked 2 to 3 weeks ago.  The history is not consistent with UDS.   On subsequent reassessments, patient was continually found resting comfortably in bed but continues to report epigastric pain.  She has been able to tolerate oral intake including medications and water.  Do not feel that additional CT scan is currently beneficial at this time.  Patient reports that she is already established care with GI and is scheduled to see them in the office next week. Patient was also given contact information for general surgery.  Patient felt to be stable for discharge home.  Medication adjustments made to decreased intake of oral medication.   The patient appears reasonably screened and/or stabilized for discharge and I doubt any other medical condition or other [facility] requiring further screening, evaluation, or treatment in the ED at this time prior to discharge. Safe for discharge with strict return precautions.  Disposition: Discharge  Condition: Good  I have discussed the results, Dx and Tx plan with the  patient/family who expressed understanding and agree(s) with the plan. Discharge instructions discussed at length. The patient/family was given strict return precautions who verbalized understanding of the instructions. No further questions at time of discharge.    ED Discharge Orders         Ordered    promethazine (PHENERGAN) 25 MG suppository  Every 6 hours PRN        07/07/20 0411    sucralfate (CARAFATE) 1 GM/10ML suspension  3 times daily with meals & bedtime        07/07/20 0411    lidocaine (XYLOCAINE) 2 % solution  Every 6 hours PRN        07/07/20 0411           Follow Up: Center, [facility] [address] [place] [address] [phone]  Go to  as scheduled  [facility] [address] [place] [address] Call  To schedule an appointment for close follow up      Nira Conn, MD 07/07/20 [phone]

## 2020-07-09 LAB — URINE CULTURE: Culture: >=100000 — AB

## 2020-07-10 ENCOUNTER — Telehealth: Payer: Self-pay | Admitting: *Deleted

## 2020-07-10 ENCOUNTER — Telehealth: Payer: Self-pay | Admitting: Emergency Medicine

## 2020-07-10 NOTE — Progress Notes (Signed)
ED Antimicrobial Stewardship Positive Culture Follow Up   Marie Myers is an 29 y.o. female who presented to [facility] on 07/06/2020 with a chief complaint of  Chief Complaint  Patient presents with  . Emesis    Recent Results (from the past 720 hour(s))  Respiratory Panel by RT PCR (Flu A&B, Covid) - Nasopharyngeal Swab     Status: None   Collection Time: 06/26/20  4:44 PM   Specimen: Nasopharyngeal Swab  Result Value Ref Range Status   SARS Coronavirus 2 by RT PCR NEGATIVE NEGATIVE Final    Comment: (NOTE) SARS-CoV-2 target nucleic acids are NOT DETECTED.  The SARS-CoV-2 RNA is generally detectable in upper respiratoy specimens during the acute phase of infection. The lowest concentration of SARS-CoV-2 viral copies this assay can detect is 131 copies/mL. A negative result does not preclude SARS-Cov-2 infection and should not be used as the sole basis for treatment or other patient management decisions. A negative result may occur with  improper specimen collection/handling, submission of specimen other than nasopharyngeal swab, presence of viral mutation(s) within the areas targeted by this assay, and inadequate number of viral copies (<131 copies/mL). A negative result must be combined with clinical observations, patient history, and epidemiological information. The expected result is Negative.  Fact Sheet for Patients:  https://www.moore.com/  Fact Sheet for Healthcare Providers:  https://www.young.biz/  This test is no t yet approved or cleared by the [place] FDA and  has been authorized for detection and/or diagnosis of SARS-CoV-2 by FDA under an Emergency Use Authorization (EUA). This EUA will remain  in effect (meaning this test can be used) for the duration of the COVID-19 declaration under Section 564(b)(1) of the Act, 21 U.S.C. section 360bbb-3(b)(1), unless the authorization is terminated or revoked sooner.      Influenza A by PCR NEGATIVE NEGATIVE Final   Influenza B by PCR NEGATIVE NEGATIVE Final    Comment: (NOTE) The Xpert Xpress SARS-CoV-2/FLU/RSV assay is intended as an aid in  the diagnosis of influenza from Nasopharyngeal swab specimens and  should not be used as a sole basis for treatment. Nasal washings and  aspirates are unacceptable for Xpert Xpress SARS-CoV-2/FLU/RSV  testing.  Fact Sheet for Patients: https://www.moore.com/  Fact Sheet for Healthcare Providers: https://www.young.biz/  This test is not yet approved or cleared by the [place] FDA and  has been authorized for detection and/or diagnosis of SARS-CoV-2 by  FDA under an Emergency Use Authorization (EUA). This EUA will remain  in effect (meaning this test can be used) for the duration of the  Covid-19 declaration under Section 564(b)(1) of the Act, 21  U.S.C. section 360bbb-3(b)(1), unless the authorization is  terminated or revoked. Performed at [facility], [address], [place], [place] [address]   Wet prep, genital     Status: Abnormal   Collection Time: 06/26/20  4:44 PM  Result Value Ref Range Status   Yeast Wet Prep HPF POC NONE SEEN NONE SEEN Final   Trich, Wet Prep NONE SEEN NONE SEEN Final   Clue Cells Wet Prep HPF POC PRESENT (A) NONE SEEN Final   WBC, Wet Prep HPF POC FEW (A) NONE SEEN Final   Sperm NONE SEEN  Final    Comment: Performed at [facility], [address] [place], [place] [address]  Urine culture     Status: Abnormal   Collection Time: 07/07/20  4:09 AM   Specimen: Urine, Catheterized  Result Value Ref Range Status  Specimen Description   Final    URINE, CATHETERIZED Performed at [facility], [address], [place], [place] [address]    Special Requests   Final    NONE Performed at [facility], [address], [place], [place] [address]    Culture (A)   Final    >=100,000 COLONIES/mL ESCHERICHIA COLI >=100,000 COLONIES/mL KLEBSIELLA PNEUMONIAE    Report Status 07/09/2020 FINAL  Final   Organism ID, Bacteria ESCHERICHIA COLI (A)  Final   Organism ID, Bacteria KLEBSIELLA PNEUMONIAE (A)  Final      Susceptibility   Escherichia coli - MIC*    AMPICILLIN 4 SENSITIVE Sensitive     CEFAZOLIN <=4 SENSITIVE Sensitive     CEFEPIME <=0.12 SENSITIVE Sensitive     CEFTRIAXONE <=0.25 SENSITIVE Sensitive     CIPROFLOXACIN <=0.25 SENSITIVE Sensitive     GENTAMICIN <=1 SENSITIVE Sensitive     IMIPENEM <=0.25 SENSITIVE Sensitive     NITROFURANTOIN <=16 SENSITIVE Sensitive     TRIMETH/SULFA <=20 SENSITIVE Sensitive     AMPICILLIN/SULBACTAM <=2 SENSITIVE Sensitive     PIP/TAZO <=4 SENSITIVE Sensitive     * >=100,000 COLONIES/mL ESCHERICHIA COLI   Klebsiella pneumoniae - MIC*    AMPICILLIN >=32 RESISTANT Resistant     CEFAZOLIN <=4 SENSITIVE Sensitive     CEFEPIME <=0.12 SENSITIVE Sensitive     CEFTRIAXONE <=0.25 SENSITIVE Sensitive     CIPROFLOXACIN <=0.25 SENSITIVE Sensitive     GENTAMICIN <=1 SENSITIVE Sensitive     IMIPENEM 0.5 SENSITIVE Sensitive     NITROFURANTOIN 128 RESISTANT Resistant     TRIMETH/SULFA <=20 SENSITIVE Sensitive     AMPICILLIN/SULBACTAM 8 SENSITIVE Sensitive     PIP/TAZO <=4 SENSITIVE Sensitive     * >=100,000 COLONIES/mL KLEBSIELLA PNEUMONIAE    Patient discharged originally without antimicrobial agent. However, due to culture growth of both E. Coli and K. Pneumoniae >100,000 colonies/mL as well as persistent abdominal pain and N/V, provider would like to treat with antibiotics.  New antibiotic prescription: Cephalexin 500 mg PO TID x 5 days  ED Provider: Meridee Score, MD   Lucina Mellow, PharmD Candidate 07/10/2020, 8:55 AM

## 2020-07-10 NOTE — Telephone Encounter (Signed)
Post ED Visit - Positive Culture Follow-up: Successful Patient Follow-Up  Culture assessed and recommendations reviewed by:  []  , Pharm.D. []  Enzo Bi, Pharm.D., BCPS AQ-ID []  , Pharm.D., BCPS []  Celedonio Miyamoto, Pharm.D., BCPS []  [place], Garvin Fila.D., BCPS, AAHIVP []  , Pharm.D., BCPS, AAHIVP []  Georgina Pillion, PharmD, BCPS []  , PharmD, BCPS []  [place], PharmD, BCPS []  [address], PharmD  Positive urine culture  [x]  Patient discharged without antimicrobial prescription and treatment is now indicated []  Organism is resistant to prescribed ED discharge antimicrobial []  Patient with positive blood cultures  Changes discussed with ED provider , MD New antibiotic prescription Keflex 500mg  TID x 5 days Called to [facility] [phone] Contacted patient, date 07/10/2020, time 1300   07/10/2020, 1:05 PM

## 2020-07-10 NOTE — Telephone Encounter (Signed)
Post ED Visit - Positive Culture Follow-up: Successful Patient Follow-Up  Culture assessed and recommendations reviewed by:  [x]  , Pharm.D. []  Enzo Bi, Pharm.D., BCPS AQ-ID []  , Pharm.D., BCPS []  Celedonio Miyamoto, .D., BCPS []  [place], .D., BCPS, AAHIVP []  Georgina Pillion, Pharm.D., BCPS, AAHIVP []  [address], PharmD, BCPS []  , PharmD, BCPS []  [place], PharmD, BCPS []  [address], PharmD  Positive urine culture  [x]  Patient discharged without antimicrobial prescription and treatment is now indicated []  Organism is resistant to prescribed ED discharge antimicrobial []  Patient with positive blood cultures  Changes discussed with ED provider: MD New antibiotic prescription start Keflex 500mg  po tid x 5 days  Attempting to contact patient  Estella Husk 07/10/2020, 9:36 AM

## 2020-08-05 ENCOUNTER — Emergency Department (HOSPITAL_BASED_OUTPATIENT_CLINIC_OR_DEPARTMENT_OTHER): Payer: 59

## 2020-08-05 ENCOUNTER — Other Ambulatory Visit: Payer: Self-pay

## 2020-08-05 ENCOUNTER — Emergency Department (HOSPITAL_BASED_OUTPATIENT_CLINIC_OR_DEPARTMENT_OTHER)
Admission: EM | Admit: 2020-08-05 | Discharge: 2020-08-05 | Disposition: A | Discharge status: Home / Self Care | Payer: 59 | Attending: Emergency Medicine | Admitting: Emergency Medicine

## 2020-08-05 ENCOUNTER — Encounter (HOSPITAL_BASED_OUTPATIENT_CLINIC_OR_DEPARTMENT_OTHER): Payer: Self-pay

## 2020-08-05 DIAGNOSIS — M79672 Pain in left foot: Secondary | ICD-10-CM

## 2020-08-05 DIAGNOSIS — Z23 Encounter for immunization: Secondary | ICD-10-CM | POA: Insufficient documentation

## 2020-08-05 DIAGNOSIS — L089 Local infection of the skin and subcutaneous tissue, unspecified: Secondary | ICD-10-CM | POA: Insufficient documentation

## 2020-08-05 MED ORDER — TETANUS-DIPHTH-ACELL PERTUSSIS 5-2.5-18.5 LF-MCG/0.5 IM SUSY
0.5000 mL | PREFILLED_SYRINGE | Freq: Once | INTRAMUSCULAR | Status: AC
  Administered 2020-08-05: 22:00:00 0.5 mL via INTRAMUSCULAR
  Filled 2020-08-05: qty 0.5

## 2020-08-05 MED ORDER — ONDANSETRON HCL 4 MG PO TABS
4.0000 mg | ORAL_TABLET | Freq: Once | ORAL | Status: DC
  Filled 2020-08-05: qty 1

## 2020-08-05 MED ORDER — HYDROCODONE-ACETAMINOPHEN 5-325 MG PO TABS
1.0000 | ORAL_TABLET | Freq: Four times a day (QID) | ORAL | 0 refills | Status: DC | PRN
Start: 2020-08-05 — End: 2020-10-15

## 2020-08-05 MED ORDER — ONDANSETRON 4 MG PO TBDP
ORAL_TABLET | ORAL | Status: AC
  Administered 2020-08-05: 20:00:00 4 mg via ORAL
  Filled 2020-08-05: qty 1

## 2020-08-05 MED ORDER — ONDANSETRON 4 MG PO TBDP: 4.0000 mg | ORAL_TABLET | Freq: Once | ORAL | Status: AC

## 2020-08-05 MED ORDER — HYDROCODONE-ACETAMINOPHEN 5-325 MG PO TABS
1.0000 | ORAL_TABLET | Freq: Once | ORAL | Status: AC
  Administered 2020-08-05: 20:00:00 1 via ORAL
  Filled 2020-08-05: qty 1

## 2020-08-05 NOTE — ED Provider Notes (Addendum)
[facility] EMERGENCY DEPARTMENT Provider Note   CSN: [id number] Arrival date & time: 08/05/20  1741     History Chief Complaint  Patient presents with  . Wound Infection    Marie Myers is a 29 y.o. female with past medical history of gastric bypass surgery. Tetanus is not UTD.  HPI Patient presents to emergency department with chief complaint of wound infection x 3 weeks.  Patient states she initially hurt her left ankle when she fell down some outside cement stairs.  She denies any open wound to her ankle after that fall. She states her left foot and ankle were swollen for approximately 1 week after the fall.  She states the swelling improved when she would elevate the leg and take Mobic.  Once the swelling improved she noticed that she had a a circular wound on the top of her left foot.  She states it has purulent drainage and is tender to the touch.  She has been applying antibiotic ointment and covering it with gauze during the day.  She states she is still having pain in her left foot and ankle.  She describes the pain as aching.  Pain is worse with ambulation.  She rates pain 10/10 in severity.  She denies fever, chills, numbness, tingling, weakness, myalgias. Denies any history of diabetes or other immunocompromising conditions.  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  She has not yet filled the prescription.    History reviewed. No pertinent past medical history.  Patient Active Problem List   Diagnosis Date Noted  . Abdominal pain 08/11/2019    Past Surgical History:  Procedure Laterality Date  . CHOLECYSTECTOMY    . GASTRIC BYPASS    . LAPAROSCOPY N/A 08/11/2019   Procedure: LAPAROSCOPY DIAGNOSTIC;  Surgeon: Harriette Bouillon, MD;  Location: [facility] OR;  Service: General;  Laterality: N/A;     OB History   No obstetric history on file.     History reviewed. No pertinent family history.  Social History    Tobacco Use  . Smoking status: Never Smoker  . Smokeless tobacco: Never Used  Substance Use Topics  . Alcohol use: Yes    Comment: occasionally  . Drug use: Never    Home Medications Prior to Admission medications   Medication Sig Start Date End Date Taking? Authorizing Provider  dicyclomine (BENTYL) 20 MG tablet Take 1 tablet (20 mg total) by mouth 2 (two) times daily as needed for spasms. Patient not taking: Reported on 07/06/2020 06/30/20   [place], Sophia, PA-C  HYDROcodone-acetaminophen (NORCO/VICODIN) 5-325 MG tablet Take 1 tablet by mouth every 6 (six) hours as needed for severe pain. 08/05/20   Walisiewicz, Yvonna Alanis E, PA-C  lidocaine (XYLOCAINE) 2 % solution Use as directed 15 mLs in the mouth or throat every 6 (six) hours as needed for mouth pain. 07/07/20   Nira Conn, MD  methocarbamol (ROBAXIN-750) 750 MG tablet Take 1 tablet (750 mg total) by mouth 4 (four) times daily. Patient not taking: Reported on 06/26/2020 12/27/19   Lorre Nick, MD  metoCLOPramide (REGLAN) 5 MG tablet Take 1 tablet (5 mg total) by mouth every 8 (eight) hours as needed for up to 20 days for nausea or vomiting. Patient not taking: Reported on 12/27/2019 08/15/19 09/04/19  Juliet Rude, PA-C  omeprazole (PRILOSEC) 20 MG capsule Take 20 mg by mouth daily.    [provider]  pantoprazole (PROTONIX) 40 MG tablet Take  1 tablet (40 mg total) by mouth daily. Patient not taking: Reported on 12/27/2019 08/16/19   Juliet RudeJohnson, Kelly R, PA-C  promethazine (PHENERGAN) 25 MG suppository Place 1 suppository (25 mg total) rectally every 6 (six) hours as needed for nausea or vomiting. 07/07/20   Nira Connardama, Pedro Eduardo, MD  promethazine (PHENERGAN) 25 MG tablet Take 1 tablet (25 mg total) by mouth every 6 (six) hours as needed for nausea or vomiting. 06/26/20   Mare FerrariBelaya, Maria A, PA-C  sucralfate (CARAFATE) 1 GM/10ML suspension Take 10 mLs (1 g total) by mouth 4 (four) times daily -  with meals and at  bedtime. Patient not taking: Reported on 07/06/2020 08/15/19   Juliet RudeJohnson, Kelly R, PA-C  sucralfate (CARAFATE) 1 GM/10ML suspension Take 10 mLs (1 g total) by mouth 4 (four) times daily -  with meals and at bedtime. 07/07/20   Cardama, Amadeo GarnetPedro Eduardo, MD    Allergies    Zithromax [azithromycin]  Review of Systems   Review of Systems All other systems are reviewed and are negative for acute change except as noted in the HPI.  Physical Exam Updated Vital Signs BP (!) 133/94   Pulse 71   Temp 98.5 F (36.9 C) (Oral)   Resp 16   Ht 5\' 4"  (1.626 m)   Wt 85.3 kg   LMP 07/28/2020   SpO2 100%   BMI 32.27 kg/m   Physical Exam Vitals and nursing note reviewed.  Constitutional:      General: She is not in acute distress.    Appearance: She is not ill-appearing.  HENT:     Head: Normocephalic and atraumatic.     Right Ear: Tympanic membrane and external ear normal.     Left Ear: Tympanic membrane and external ear normal.     Nose: Nose normal.     Mouth/Throat:     Mouth: Mucous membranes are moist.     Pharynx: Oropharynx is clear.  Eyes:     General: No scleral icterus.       Right eye: No discharge.        Left eye: No discharge.     Extraocular Movements: Extraocular movements intact.     Conjunctiva/sclera: Conjunctivae normal.     Pupils: Pupils are equal, round, and reactive to light.  Neck:     Vascular: No JVD.  Cardiovascular:     Rate and Rhythm: Normal rate and regular rhythm.     Pulses: Normal pulses.          Radial pulses are 2+ on the right side and 2+ on the left side.       Dorsalis pedis pulses are 2+ on the right side and 2+ on the left side.     Heart sounds: Normal heart sounds.  Pulmonary:     Comments: Lungs clear to auscultation in all fields. Symmetric chest rise. No wheezing, rales, or rhonchi. Abdominal:     Comments: Abdomen is soft, non-distended, and non-tender in all quadrants. No rigidity, no guarding. No peritoneal signs.  Musculoskeletal:         General: Normal range of motion.     Cervical back: Normal range of motion.     Comments: Compartments in left lower extremity are soft.  Feet:     Comments: Please see media below  Wound measures approximately 2 cm in length.  No purulent drainage.  Tender to palpation.  No palpable fluctuance.  There is surrounding induration.  No red tracking. Skin:  General: Skin is warm and dry.     Capillary Refill: Capillary refill takes less than 2 seconds.  Neurological:     Mental Status: She is oriented to person, place, and time.     GCS: GCS eye subscore is 4. GCS verbal subscore is 5. GCS motor subscore is 6.     Comments: Fluent speech, no facial droop.  Psychiatric:        Behavior: Behavior normal.       ED Results / Procedures / Treatments   Labs (all labs ordered are listed, but only abnormal results are displayed) Labs Reviewed - No data to display  EKG None  Radiology DG Ankle Complete Left  Result Date: 08/05/2020 CLINICAL DATA:  Fall with wound EXAM: LEFT ANKLE COMPLETE - 3+ VIEW COMPARISON:  None. FINDINGS: There is no evidence of fracture, dislocation, or joint effusion. There is no evidence of arthropathy or other focal bone abnormality. Soft tissues are unremarkable. IMPRESSION: Negative. Electronically Signed   By: Jasmine Pang M.D.   On: 08/05/2020 19:49   DG Foot Complete Left  Result Date: 08/05/2020 CLINICAL DATA:  Fall with open wound EXAM: LEFT FOOT - COMPLETE 3+ VIEW COMPARISON:  None. FINDINGS: No fracture or malalignment. No periostitis or bone destruction. Soft tissue thickening with lucency on the lateral view anterior to the ankle presumably corresponds to the history of a wound. IMPRESSION: No acute osseous abnormality. Electronically Signed   By: Jasmine Pang M.D.   On: 08/05/2020 19:49    Procedures Procedures (including critical care time)  Medications Ordered in ED Medications  ondansetron (ZOFRAN) tablet 4 mg (4 mg Oral Not Given  08/05/20 1940)  Tdap (BOOSTRIX) injection 0.5 mL (has no administration in time range)  HYDROcodone-acetaminophen (NORCO/VICODIN) 5-325 MG per tablet 1 tablet (1 tablet Oral Given 08/05/20 1939)  ondansetron (ZOFRAN-ODT) disintegrating tablet 4 mg (4 mg Oral Given 08/05/20 1941)    ED Course  I have reviewed the triage vital signs and the nursing notes.  Pertinent labs & imaging results that were available during my care of the patient were reviewed by me and considered in my medical decision making (see chart for details).    MDM Rules/Calculators/A&P                          History provided by patient with additional history obtained from chart review.    Patient presents to the ED with complaints of pain to the left ankle s/p injury mechanical fall x 3 weeks ago. Exam without obvious deformity. Decreased ROM 2/2 to pain. ROM intact. Ankle joint intact.Tender to palpation over wound.  No gross abscess.  NVI distally. Able to ambulate without difficulty. Xray of left foot and ankle viewed by me is negative for fracture/dislocation. No foreign body seen.  Agree with radiologist impression.  Wound care performed and tetanus updated. She already has prescription for antibiotic at her pharmacy called in by another provider. I have reviewed the PDMP during this encounter.  Does not have current narcotic prescription.  Will give short course for severe pain only. I discussed results, treatment plan, need for follow-up, and return precautions with the patient. Recommend wound check in 2 days by pcp. Provided opportunity for questions, patient confirmed understanding and are in agreement with plan.    Portions of this note were generated with Scientist, clinical (histocompatibility and immunogenetics). Dictation errors may occur despite best attempts at proofreading.    Final Clinical Impression(s) /  ED Diagnoses Final diagnoses:  Left foot pain    Rx / DC Orders ED Discharge Orders         Ordered     HYDROcodone-acetaminophen (NORCO/VICODIN) 5-325 MG tablet  Every 6 hours PRN        08/05/20 2141           Shanon Ace, PA-C 08/05/20 2146    Shanon Ace, PA-C 08/05/20 2147    Pollyann Savoy, MD 08/05/20 2257

## 2020-08-05 NOTE — ED Triage Notes (Signed)
Pt reports she fell on 11/20 causing her L ankle to swell and hurt. Wound size of dime noted to L side of ankle. Pt states the wound has not healed and seems to be getting infected.

## 2020-08-05 NOTE — ED Notes (Signed)
Wound cleaned. Dressing applied.

## 2020-08-05 NOTE — Discharge Instructions (Addendum)
-  Antibiotic as prescribed   -Clean the wound at least twice daily with soap and water.  Apply antibiotic ointment and cover the wound if you are going to be wearing socks.  -Elevate as much as possible to help with the swelling.  -Prescription sent to the pharmacy for Norco for severe pain only.  Take if needed.  Do not drive or work when taking as it can make you drowsy.  -Follow-up with PCP for wound check in 3 days.  Return to emergency room for any new or worsening symptoms.

## 2020-09-13 ENCOUNTER — Other Ambulatory Visit: Payer: Self-pay

## 2020-09-13 ENCOUNTER — Other Ambulatory Visit (HOSPITAL_COMMUNITY): Payer: Self-pay | Admitting: Internal Medicine

## 2020-09-13 ENCOUNTER — Encounter (HOSPITAL_BASED_OUTPATIENT_CLINIC_OR_DEPARTMENT_OTHER): Payer: 59 | Attending: Internal Medicine | Admitting: Internal Medicine

## 2020-09-13 ENCOUNTER — Ambulatory Visit (HOSPITAL_COMMUNITY)
Admission: RE | Admit: 2020-09-13 | Discharge: 2020-09-13 | Disposition: A | Discharge status: Home / Self Care | Payer: 59 | Source: Ambulatory Visit | Attending: Internal Medicine | Admitting: Internal Medicine

## 2020-09-13 DIAGNOSIS — L97321 Non-pressure chronic ulcer of left ankle limited to breakdown of skin: Secondary | ICD-10-CM | POA: Insufficient documentation

## 2020-09-13 DIAGNOSIS — Z87891 Personal history of nicotine dependence: Secondary | ICD-10-CM | POA: Insufficient documentation

## 2020-09-13 DIAGNOSIS — X58XXXA Exposure to other specified factors, initial encounter: Secondary | ICD-10-CM | POA: Insufficient documentation

## 2020-09-13 DIAGNOSIS — Z9884 Bariatric surgery status: Secondary | ICD-10-CM | POA: Diagnosis not present

## 2020-09-13 DIAGNOSIS — S8412XA Injury of peroneal nerve at lower leg level, left leg, initial encounter: Secondary | ICD-10-CM | POA: Insufficient documentation

## 2020-09-18 NOTE — Progress Notes (Signed)
KENDEL, PESNELL ([id number]) Visit Report for 09/13/2020 Chief Complaint Document Details Patient Name: Date of Service: Marie Myers CEA NA 09/13/2020 1:15 PM Medical Record Number: [id number] Patient Account Number: [id number] Date of Birth/Sex: Treating RN: 03/19/1991 (30 y.o. Female) Zenaida Deed Primary Care Provider: [facility], [facility]ier Other Clinician: Referring Provider: Treating Provider/Extender: Valentino Myers in Treatment: 0 Information Obtained from: Patient Chief Complaint 09/13/2020; patient is here for review of a wound over the left lateral malleolus Electronic Signature(s) Signed: 09/13/2020 4:10:40 PM By: Baltazar Najjar MD Entered By: Baltazar Najjar on 09/13/2020 14:45:16 -------------------------------------------------------------------------------- Debridement Details Patient Name: Date of Service: MO Marie Myers CEA NA 09/13/2020 1:15 PM Medical Record Number: [id number] Patient Account Number: [id number] Date of Birth/Sex: Treating RN: June 26, 1991 (30 y.o. Female) Zenaida Deed Primary Care Provider: [facility], [facility] Copier Other Clinician: Referring Provider: Treating Provider/Extender: Valentino Myers in Treatment: 0 Debridement Performed for Assessment: Wound #1 Left,Lateral Foot Performed By: Clinician Zenaida Deed, RN Debridement Type: Chemical/Enzymatic/Mechanical Agent Used: gauze Level of Consciousness (Pre-procedure): Awake and Alert Pre-procedure Verification/Time Out No Taken: Bleeding: None Response to Treatment: Procedure was tolerated well Level of Consciousness (Post- Awake and Alert procedure): Post Debridement Measurements of Total Wound Length: (cm) 1 Width: (cm) 1.2 Depth: (cm) 0.1 Volume: (cm) 0.094 Character of Wound/Ulcer Post Debridement: Requires Further Debridement Post Procedure Diagnosis Same as Pre-procedure Electronic Signature(s) Signed: 09/13/2020 4:10:40 PM By: Baltazar Najjar MD Signed: 09/13/2020 4:31:51 PM By:  Zenaida Deed RN, BSN Entered By: Zenaida Deed on 09/13/2020 14:30:32 -------------------------------------------------------------------------------- HPI Details Patient Name: Date of Service: [name], [name] CEA NA 09/13/2020 1:15 PM Medical Record Number: [id number] Patient Account Number: [id number] Date of Birth/Sex: Treating RN: 1991-07-26 (29 y.o. Female) Zenaida Deed Primary Care Provider: [facility], [place] Other Clinician: Referring Provider: Treating Provider/Extender: Valentino Myers in Treatment: 0 History of Present Illness HPI Description: ADMISSION 09/13/2020 This is a generally healthy 30 year old woman whose only relevant past medical history is a Roux-en-Y gastric bypass in 2016. She had some issues with refractory nausea and vomiting and abdominal pain and had a laparotomy on 08/11/2019 which I think was negative. Most recent admission was in December to [facility] again with refractory nausea and vomiting I do not think much was found. Her only medications are Phenergan and omeprazole. Her history is that she was celebrating her birthday on [birth year] and she fell going down stairs. I think she was inebriated although her did not exactly come out clearly and there may have been multiple falls. In any case the next morning she had a an open area on the left lateral malleolus extreme pain in the left ankle. 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 She was seen in the ER on 08/05/2020. I reviewed the x-rays of the left ankle and foot these were negative. She received an antibiotic from one of the doctors in her office although I am not sure which one that was. This may have been infected at one point. She is able to show me a series of pictures. This initially had a scabbed eschar then opened up but really has not changed much in the last month. She had a lot of swelling over this area that is recently come down however she still  has very significant pain. This is complicated by the fact that she needs to stand on this at work. She also describes numbness in the dorsal left foot and ankle extending into the lateral part of her  calf. She says she cannot take narcotics or NSAIDs secondary to her gastric bypass hence she has been taking approximately 6 to 8 g of acetaminophen a day Past medical history other than what is stated above nothing contributory. ABI in our office was 1.02 on the left Electronic Signature(s) Signed: 09/13/2020 4:10:40 PM By: Baltazar Najjar MD Entered By: Baltazar Najjar on 09/13/2020 14:51:45 -------------------------------------------------------------------------------- Physical Exam Details Patient Name: Date of Service: MO Marie Myers CEA NA 09/13/2020 1:15 PM Medical Record Number: [id number] Patient Account Number: [id number] Date of Birth/Sex: Treating RN: 1991/04/28 (30 y.o. Female) Zenaida Deed Primary Care Provider: [facility], [place] Other Clinician: Referring Provider: Treating Provider/Extender: Valentino Myers in Treatment: 0 Constitutional Patient is hypertensive.. Pulse regular and within target range for patient.[name] Respirations regular, non-labored and within target range.. Temperature is normal and within the target range for the patient.[name] Appears in no distress. Respiratory work of breathing is normal. Cardiovascular Pedal pulses are normal in the left foot at the dorsalis pedis and posterior tibial. Musculoskeletal There is no obvious ankle effusion she is tender along the joint line anteriorly and laterally. Seems not to like inversion of the ankle. Limited range of motion. Neurological She has a lack of sensation on the dorsal left foot extending up the lateral part of her leg. Notes Wound exam; small clean looking wound on the left lateral malleolus. Even under illumination this looks clean some darker skin around this but no overt infection Electronic  Signature(s) Signed: 09/13/2020 4:10:40 PM By: Baltazar Najjar MD Entered By: Baltazar Najjar on 09/13/2020 14:51:05 -------------------------------------------------------------------------------- Physician Orders Details Patient Name: Date of Service: MO Marie Myers CEA NA 09/13/2020 1:15 PM Medical Record Number: [id number] Patient Account Number: [id number] Date of Birth/Sex: Treating RN: 1991/02/08 (30 y.o. Female) Zenaida Deed Primary Care Provider: [facility], [place] Other Clinician: Referring Provider: Treating Provider/Extender: Valentino Myers in Treatment: 0 Verbal / Phone Orders: No Diagnosis Coding Follow-up Appointments Return Appointment in 1 week. Bathing/ Shower/ Hygiene May shower and wash wound with soap and water. Wound Treatment Wound #1 - Foot Wound Laterality: Left, Lateral Prim Dressing: Promogran Prisma Matrix, 4.34 (sq in) (silver collagen) (DME) (Dispense As Written) Every Other Day/30 Days ary Discharge Instructions: Moisten collagen with saline or hydrogel Secondary Dressing: ComfortFoam Border, 4x4 in (silicone border) (DME) (Generic) Every Other Day/30 Days Discharge Instructions: Apply over primary dressing as directed. Secured With: Elastic Bandage 4 inch (ACE bandage) (DME) (Generic) Every Other Day/30 Days Discharge Instructions: Secure with ACE bandage as directed. Consults Orthopedic - [facility] for evaluation of painful left ankle and foot s/p fall Radiology X-ray, ankle left 3 view - pain and nonhealing wound to left ankle CPT ICD-10 Electronic Signature(s) Signed: 09/13/2020 4:10:40 PM By: Baltazar Najjar MD Signed: 09/13/2020 4:31:51 PM By: Zenaida Deed RN, BSN Entered By: Zenaida Deed on 09/13/2020 14:31:43 Prescription 09/13/2020 -------------------------------------------------------------------------------- Caleb Popp MD Patient Name: Provider: April 20, 1991 [id number] Date of Birth: NPI#: Female  [id number] Sex: DEA #: [phone] Phone #: License #: Eligha Bridegroom [facility] Patient Address: [address] [place] [address] [place], [place] [address] [address] [place], [place] [address] [phone] Allergies azithromycin Provider's Orders X-ray, ankle left 3 view - pain and nonhealing wound to left ankle CPT ICD-10 Hand Signature: Date(s): Prescription 09/13/2020 Caleb Popp MD Patient Name: Provider: 02/17/1991 [id number] Date of Birth: NPI#: Female [id number] Sex: DEA #: [phone] Phone #: License #: Eligha Bridegroom [facility] Patient Address:  [address] [place] [address] [place], [place] [address] [address] [place], [place] [address] [phone] Allergies azithromycin Provider's Orders Orthopedic - [facility] for evaluation of painful left ankle and foot s/p fall Hand Signature: Date(s): Electronic Signature(s) Signed: 09/13/2020 4:10:40 PM By: Baltazar Najjar MD Signed: 09/13/2020 4:31:51 PM By: Zenaida Deed RN, BSN Entered By: Zenaida Deed on 09/13/2020 14:31:43 -------------------------------------------------------------------------------- Problem List Details Patient Name: Date of Service: MO Marie Myers CEA NA 09/13/2020 1:15 PM Medical Record Number: [id number] Patient Account Number: [id number] Date of Birth/Sex: Treating RN: 07/01/1991 (29 y.o. 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 She had some issues with refractory nausea and vomiting and abdominal pain and had a laparotomy on 08/11/2019 which I think was negative. Most recent admission was in December to [facility] again with refractory nausea and vomiting I do not think much was found. Her only medications are Phenergan and omeprazole. Her history is that she was celebrating her birthday on November [birth year] and she fell going down stairs. I think she was inebriated although her did not exactly come out clearly and there may have been multiple falls. In any case the next morning she had a an open area on the left lateral malleolus extreme pain in the left ankle. 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 She was seen in the ER on 08/05/2020. I reviewed the x-rays of the left ankle and foot these were negative. She received an antibiotic from one of the doctors in her office although I am not sure which one that was. This may have been infected at one  point. She is able to show me a series of pictures. This initially had a scabbed eschar then opened up but really has not changed much in the last month. She had a lot of swelling over this area that is recently come down however she still has very significant pain. This is complicated by the fact that she needs to stand on this at work. She also describes numbness in the dorsal left foot and ankle extending into the lateral part of her calf. She says she cannot take narcotics or NSAIDs secondary to her gastric bypass hence she has been taking approximately 6 to 8 g of acetaminophen a day Past medical history other than what is stated above nothing contributory. ABI in  our office was 1.02 on the left Patient History Information obtained from Patient. Allergies azithromycin (Severity: Moderate) Family History Diabetes - Mother,Siblings, No family history of Cancer, Heart Disease, Hereditary Spherocytosis, Hypertension, Kidney Disease, Lung Disease, Seizures, Stroke, Thyroid Problems, Tuberculosis. Social History Former smoker, Marital Status - Single, Alcohol Use - Rarely, Drug Use - No History, Caffeine Use - Moderate. Medical History Eyes Denies history of Cataracts, Glaucoma, Optic Neuritis Ear/Nose/Mouth/Throat Denies history of Chronic sinus problems/congestion, Middle ear problems Hematologic/Lymphatic Denies history of Anemia, Hemophilia, Human Immunodeficiency Virus, Lymphedema, Sickle Cell Disease Respiratory Denies history of Aspiration, Asthma, Chronic Obstructive Pulmonary Disease (COPD), Pneumothorax, Sleep Apnea, Tuberculosis Cardiovascular Denies history of Angina, Arrhythmia, Congestive Heart Failure, Coronary Artery Disease, Deep Vein Thrombosis, Hypertension, Hypotension, Myocardial Infarction, Peripheral Arterial Disease, Peripheral Venous Disease, Phlebitis, Vasculitis Gastrointestinal Denies history of Cirrhosis , Colitis, Crohnoos, Hepatitis A, Hepatitis B,  Hepatitis C Endocrine Denies history of Type I Diabetes, Type II Diabetes Genitourinary Denies history of End Stage Renal Disease Immunological Denies history of Lupus Erythematosus, Raynaudoos, Scleroderma Integumentary (Skin) Denies history of History of Burn Musculoskeletal Denies history of Gout, Rheumatoid Arthritis, Osteoarthritis, Osteomyelitis Neurologic Denies history of Dementia, Neuropathy, Quadriplegia, Paraplegia, Seizure Disorder Oncologic Denies history of Received Chemotherapy, Received Radiation Psychiatric Denies history of Anorexia/bulimia, Confinement Anxiety Hospitalization/Surgery History - cholecystectomy. - pin in left leg. - left foot infection. Medical A Surgical History Notes nd Gastrointestinal hx of gastric bypass Review of Systems (ROS) Constitutional Symptoms (General Health) Denies complaints or symptoms of Fatigue, Fever, Chills, Marked Weight Change. Eyes Complains or has symptoms of Glasses / Contacts. Denies complaints or symptoms of Dry Eyes, Vision Changes. Ear/Nose/Mouth/Throat Denies complaints or symptoms of Chronic sinus problems or rhinitis. Respiratory Denies complaints or symptoms of Chronic or frequent coughs, Shortness of Breath. Cardiovascular Denies complaints or symptoms of Chest pain. Gastrointestinal Denies complaints or symptoms of Frequent diarrhea, Nausea, Vomiting. Endocrine Denies complaints or symptoms of Heat/cold intolerance. Genitourinary Denies complaints or symptoms of Frequent urination. Integumentary (Skin) Complains or has symptoms of Wounds. Musculoskeletal Denies complaints or symptoms of Muscle Pain, Muscle Weakness. Neurologic Denies complaints or symptoms of Numbness/parasthesias. Psychiatric Denies complaints or symptoms of Claustrophobia, Suicidal. Objective Constitutional Patient is hypertensive.. Pulse regular and within target range for patient.[name]. Respirations regular, non-labored and within  target range.. Temperature is normal and within the target range for the patient.[name]. Appears in no distress. Vitals Time Taken: 1:43 PM, Height: 65 in, Source: Stated, Weight: 180 lbs, Source: Stated, BMI: 30, Temperature: 98.3 F, Pulse: 105 bpm, Respiratory Rate: 17 breaths/min, Blood Pressure: 144/84 mmHg. Respiratory work of breathing is normal. Cardiovascular Pedal pulses are normal in the left foot at the dorsalis pedis and posterior tibial. Musculoskeletal There is no obvious ankle effusion she is tender along the joint line anteriorly and laterally. Seems not to like inversion of the ankle. Limited range of motion. Neurological She has a lack of sensation on the dorsal left foot extending up the lateral part of her leg. General Notes: Wound exam; small clean looking wound on the left lateral malleolus. Even under illumination this looks clean some darker skin around this but no overt infection Integumentary (Hair, Skin) Wound #1 status is Open. Original cause of wound was Gradually Appeared. The wound is located on the Left,Lateral Foot. The wound measures 1cm length x 1.2cm width x 0.1cm depth; 0.942cm^2 area and 0.094cm^3 volume. There is no tunneling or undermining noted. There is a medium amount of purulent drainage noted. The wound margin is distinct with the  outline attached to the wound base. There is large (67-100%) red, pink granulation within the wound bed. There is no necrotic tissue within the wound bed. 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 There was a Chemical/Enzymatic/Mechanical debridement performed by Zenaida Deed, RN.. Other agent used was gauze. There  was no bleeding. The procedure was tolerated well. Post Debridement Measurements: 1cm length x 1.2cm width x 0.1cm depth; 0.094cm^3 volume. Character of Wound/Ulcer Post Debridement requires further debridement. Post procedure Diagnosis Wound #1: Same as Pre-Procedure Plan Follow-up Appointments: Return Appointment in 1 week. Bathing/ Shower/ Hygiene: May shower and wash wound with soap and water. Radiology ordered were: X-ray, ankle left 3 view - pain and nonhealing wound to left ankle CPT ICD-10 Consults ordered were: Orthopedic - [facility] for evaluation of painful left ankle and foot s/p fall WOUND #1: - Foot Wound Laterality: Left, Lateral Prim Dressing: Promogran Prisma Matrix, 4.34 (sq in) (silver collagen) (DME) (Dispense As Written) Every Other Day/30 Days ary Discharge Instructions: Moisten collagen with saline or hydrogel Secondary Dressing: ComfortFoam Border, 4x4 in (silicone border) (DME) (Generic) Every Other Day/30 Days Discharge Instructions: Apply over primary dressing as directed. Secured With: Elastic Bandage 4 inch (ACE bandage) (DME) (Generic) Every Other Day/30 Days Discharge Instructions: Secure with ACE bandage as directed. 1. If she has not had an occult fracture which I will check with another x-ray then I think she had a very substantial ankle sprain with the collateral ligaments on the left lateral ankle. 2. The numbness would be best diagnosed as a common peroneal nerve injury perhaps at the level of the fibular head 3. The wound itself looks clean. Were going to use collagen on this with foam and an Ace wrap. I have asked asked her to make sure to wear shoes that do not rub on this wound area. There are apparently no options to get her off this ankle at work 4. Repeat x-ray ordered 5. No cultures no antibiotics 6. I am going to refer her for an orthopedic review. I spent 35 minutes in review of this patient's past medical history, face-to-face evaluation  and preparation of this record Electronic Signature(s) Signed: 09/13/2020 4:10:40 PM By: Baltazar Najjar MD Entered By: Baltazar Najjar on 09/13/2020 14:53:31 -------------------------------------------------------------------------------- HxROS Details Patient Name: Date of Service: [name], [name] CEA NA 09/13/2020 1:15 PM Medical Record Number: [id number] Patient Account Number: [id number] Date of Birth/Sex: Treating RN: 06/02/1991 (30 y.o. Female) Fonnie Mu Primary Care Provider: [facility], [facility] Other Clinician: Referring Provider: Treating Provider/Extender: Valentino Myers in Treatment: 0 Information Obtained From Patient Constitutional Symptoms (General Health) Complaints and Symptoms: Negative for: Fatigue; Fever; Chills; Marked Weight Change Eyes Complaints and Symptoms: Positive for: Glasses / Contacts Negative for: Dry Eyes; Vision Changes Medical History: Negative for: Cataracts; Glaucoma; Optic Neuritis Ear/Nose/Mouth/Throat Complaints and Symptoms: Negative for: Chronic sinus problems or rhinitis Medical History: Negative for: Chronic sinus problems/congestion; Middle ear problems Respiratory Complaints and Symptoms: Negative for: Chronic or frequent coughs; Shortness of Breath Medical History: Negative for: Aspiration; Asthma; Chronic Obstructive Pulmonary Disease (COPD); Pneumothorax; Sleep Apnea; Tuberculosis Cardiovascular Complaints and Symptoms: Negative for: Chest pain Medical History: Negative for: Angina; Arrhythmia; Congestive Heart Failure; Coronary Artery Disease; Deep Vein Thrombosis; Hypertension;  Hypotension; Myocardial Infarction; Peripheral Arterial Disease; Peripheral Venous Disease; Phlebitis; Vasculitis Gastrointestinal Complaints and Symptoms: Negative for: Frequent diarrhea; Nausea; Vomiting Medical History: Negative for: Cirrhosis ; Colitis; Crohns; Hepatitis A; Hepatitis B; Hepatitis C Past Medical History Notes: hx of  gastric bypass Endocrine Complaints and Symptoms: Negative for: Heat/cold intolerance Medical History: Negative for: Type I Diabetes; Type II Diabetes Genitourinary Complaints and Symptoms: Negative for: Frequent urination Medical History: Negative for: End Stage Renal Disease Integumentary (Skin) Complaints and Symptoms: Positive for: Wounds Medical History: Negative for: History of Burn Musculoskeletal Complaints and Symptoms: Negative for: Muscle Pain; Muscle Weakness Medical History: Negative for: Gout; Rheumatoid Arthritis; Osteoarthritis; Osteomyelitis Neurologic Complaints and Symptoms: Negative for: Numbness/parasthesias Medical History: Negative for: Dementia; Neuropathy; Quadriplegia; Paraplegia; Seizure Disorder Psychiatric Complaints and Symptoms: Negative for: Claustrophobia; Suicidal Medical History: Negative for: Anorexia/bulimia; Confinement Anxiety Hematologic/Lymphatic Medical History: Negative for: Anemia; Hemophilia; Human Immunodeficiency Virus; Lymphedema; Sickle Cell Disease Immunological Medical History: Negative for: Lupus Erythematosus; Raynauds; Scleroderma Oncologic Medical History: Negative for: Received Chemotherapy; Received Radiation Immunizations Pneumococcal Vaccine: Received Pneumococcal Vaccination: No Implantable Devices None Hospitalization / Surgery History Type of Hospitalization/Surgery cholecystectomy pin in left leg left foot infection Family and Social History Cancer: No; Diabetes: Yes - Mother,Siblings; Heart Disease: No; Hereditary Spherocytosis: No; Hypertension: No; Kidney Disease: No; Lung Disease: No; Seizures: No; Stroke: No; Thyroid Problems: No; Tuberculosis: No; Former smoker; Marital Status - Single; Alcohol Use: Rarely; Drug Use: No History; Caffeine Use: Moderate; Financial Concerns: No; Food, Clothing or Shelter Needs: No; Support System Lacking: No; Transportation 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, left leg, subsequent encounter Facility Procedures CPT4 Code: [id number] 9 Description: 9214 - WOUND CARE VISIT-LEV 4 EST PT Modifier: Quantity: 1 Physician Procedures : CPT4 Code Description Modifier [id number] WC PHYS LEVEL 3 NEW PT ICD-10 Diagnosis Description L97.321 Non-pressure chronic ulcer of left ankle limited to breakdown of skin M25.572 Pain in left ankle and joints of left foot S84.12XD Injury of peroneal  nerve at lower leg level, left leg, subsequent encounter Quantity: 1 Electronic Signature(s) Signed: 09/13/2020 4:10:40 PM By: Baltazar Najjar MD Signed: 09/13/2020 4:31:51 PM By: Zenaida Deed RN, BSN Entered By: Zenaida Deed on 09/13/2020 16:03:51

## 2020-09-18 NOTE — Progress Notes (Signed)
Marie Myers ([id number]) Visit Report for 09/13/2020 Allergy List Details Patient Name: Date of Service: Marie Myers CEA Myers 09/13/2020 1:15 PM Medical Record Number: [id number] Patient Account Number: [id number] Date of Birth/Sex: Treating RN: March 23, 1991 (30 y.o. Female) Fonnie Mu Primary Care Awesome Jared: [place] Other Clinician: Referring Derion Kreiter: Treating Phylis Javed/Extender: [name] in Treatment: 0 Allergies Active Allergies azithromycin Severity: Moderate Type: Food Allergy Notes Electronic Signature(s) Signed: 09/18/2020 5:56:51 PM By: Fonnie Mu RN Entered By: Fonnie Mu on 09/13/2020 13:16:43 -------------------------------------------------------------------------------- Arrival Information Details Patient Name: Date of Service: Marie Myers CEA Myers 09/13/2020 1:15 PM Medical Record Number: [id number] Patient Account Number: [id number] Date of Birth/Sex: Treating RN: 04-23-1991 (30 y.o. Female) Fonnie Mu Primary Care Shanyia Stines: [place] Other Clinician: Referring Kelcey Wickstrom: Treating Kristene Liberati/Extender: [name] in Treatment: 0 Visit Information Patient Arrived: Ambulatory Arrival Time: 13:15 Accompanied By: self Transfer Assistance: None Patient Identification Verified: Yes Secondary Verification Process Completed: Yes Patient Requires Transmission-Based Precautions: No Patient Has Alerts: No Electronic Signature(s) Signed: 09/18/2020 5:56:51 PM By: Fonnie Mu RN Entered By: Fonnie Mu on 09/13/2020 13:15:26 -------------------------------------------------------------------------------- Clinic Level of Care Assessment Details Patient Name: Date of Service: Marie Myers CEA Myers 09/13/2020 1:15 PM Medical Record Number: [id number] Patient Account Number: [id number] Date of Birth/Sex: Treating RN: 21-Dec-1990 (30 y.o. Female) Zenaida Deed Primary Care Nyilah Kight: [place], [place] Other  Clinician: Referring Riyanshi Wahab: Treating Arnold Kester/Extender: [name] in Treatment: 0 Clinic Level of Care Assessment Items TOOL 2 Quantity Score []  - 0 Use when only an EandM is performed on the INITIAL visit ASSESSMENTS - Nursing Assessment / Reassessment X- 1 20 General Physical Exam (combine w/ comprehensive assessment (listed just below) when performed on new pt. evals) X- 1 25 Comprehensive Assessment (HX, ROS, Risk Assessments, Wounds Hx, etc.) ASSESSMENTS - Wound and Skin A ssessment / Reassessment []  - 0 Simple Wound Assessment / Reassessment - one wound []  - 0 Complex Wound Assessment / Reassessment - multiple wounds []  - 0 Dermatologic / Skin Assessment (not related to wound area) ASSESSMENTS - Ostomy and/or Continence Assessment and Care []  - 0 Incontinence Assessment and Management []  - 0 Ostomy Care Assessment and Management (repouching, etc.) PROCESS - Coordination of Care X - Simple Patient / Family Education for ongoing care 1 15 []  - 0 Complex (extensive) Patient / Family Education for ongoing care X- 1 10 Staff obtains , Records, T Results / Process Orders est []  - 0 Staff telephones HHA, Nursing Homes / Clarify orders / etc []  - 0 Routine Transfer to another Facility (non-emergent condition) []  - 0 Routine Hospital Admission (non-emergent condition) X- 1 15 New Admissions / / Ordering NPWT Apligraf, etc. , []  - 0 Emergency Hospital Admission (emergent condition) X- 1 10 Simple Discharge Coordination []  - 0 Complex (extensive) Discharge Coordination PROCESS - Special Needs []  - 0 Pediatric / Minor Patient Management []  - 0 Isolation Patient Management []  - 0 Hearing / Language / Visual special needs []  - 0 Assessment of Community assistance (transportation, D/C planning, etc.) []  - 0 Additional assistance / Altered mentation []  - 0 Support Surface(s) Assessment (bed, cushion, seat,  etc.) INTERVENTIONS - Wound Cleansing / Measurement X- 1 5 Wound Imaging (photographs - any number of wounds) []  - 0 Wound Tracing (instead of photographs) X- 1 5 Simple Wound Measurement - one wound []  - 0 Complex Wound Measurement - multiple wounds X- 1 5 Simple Wound Cleansing - one wound []  - 0 Complex Wound Cleansing - multiple  wounds INTERVENTIONS - Wound Dressings X - Small Wound Dressing one or multiple wounds 1 10 []  - 0 Medium Wound Dressing one or multiple wounds []  - 0 Large Wound Dressing one or multiple wounds []  - 0 Application of Medications - injection INTERVENTIONS - Miscellaneous []  - 0 External ear exam []  - 0 Specimen Collection (cultures, biopsies, blood, body fluids, etc.) []  - 0 Specimen(s) / Culture(s) sent or taken to Lab for analysis []  - 0 Patient Transfer (multiple staff / Lift / Similar devices) []  - 0 Simple Staple / Suture removal (25 or less) []  - 0 Complex Staple / Suture removal (26 or more) []  - 0 Hypo / Hyperglycemic Management (close monitor of Blood Glucose) X- 1 15 Ankle / Brachial Index (ABI) - do not check if billed separately Has the patient been seen at the hospital within the last three years: Yes Total Score: 135 Level Of Care: New/Established - Level 4 Electronic Signature(s) Signed: 09/13/2020 4:31:51 PM By: RN, BSN Entered By: on 09/13/2020 14:18:27 -------------------------------------------------------------------------------- Encounter Discharge Information Details Patient Name: Date of Service: Marie Myers 09/13/2020 1:15 PM Medical Record Number: [name] Patient Account Number: Date of Birth/Sex: Treating RN: March 08, 1991 (30 y.o. Female) 09/15/2020 Primary Care Nasire Reali: [place], [place] Deed Other Clinician: Referring Reneta Niehaus: Treating Diannah Rindfleisch/Extender: Zenaida Deed in Treatment: 0 Encounter Discharge Information Items Post Procedure  Vitals Discharge Condition: Stable Temperature (F): 98.3 Ambulatory Status: Ambulatory Pulse (bpm): 105 Discharge Destination: Home Respiratory Rate (breaths/min): 17 Transportation: Private Auto Blood Pressure (mmHg): 144/84 Accompanied By: family member Schedule Follow-up Appointment: Yes Clinical Summary of Care: Electronic Signature(s) Signed: 09/13/2020 4:31:03 PM By: Neta Myers Entered By: 09/15/2020 on 09/13/2020 14:57:58 -------------------------------------------------------------------------------- Lower Extremity Assessment Details Patient Name: Date of Service: [id number] CEA Myers 09/13/2020 1:15 PM Medical Record Number: [id number] Patient Account Number: Shawn Stall Date of Birth/Sex: Treating RN: Jul 26, 1991 (30 y.o. Female) 09/15/2020 Primary Care Liv Rallis: [place], [place] Other Clinician: Referring Jacklyne Baik: Treating Andres Escandon/Extender: Shawn Stall in Treatment: 0 Edema Assessment Assessed: [Left: Yes] [Right: No] E[Left: dema] [Right: :] Calf Left: Right: Point of Measurement: From Medial Instep 37 cm Ankle Left: Right: Point of Measurement: From Medial Instep 18 cm Vascular Assessment Pulses: Dorsalis Pedis Palpable: [Left:Yes] [Right:Yes] Posterior Tibial Palpable: [Left:Yes] [Right:Yes] Blood Pressure: Brachial: [Left:144] Ankle: [Left:Dorsalis Pedis: 150 1.04] Electronic Signature(s) Signed: 09/18/2020 5:56:51 PM By: Marie Low RN Entered By: 09/15/2020 on 09/13/2020 13:52:02 -------------------------------------------------------------------------------- Multi Wound Chart Details Patient Name: Date of Service: Marie RRIS, [id number] CEA Myers 09/13/2020 1:15 PM Medical Record Number: [id number] Patient Account Number: Fonnie Mu Date of Birth/Sex: Treating RN: 29-Oct-1990 (30 y.o. Female) 09/20/2020 Primary Care Vesper Trant: [place]nie Mu Other Clinician: Referring Katielynn Horan: Treating Emilyanne Mcgough/Extender: Fonnie Mu in Treatment: 0 Vital Signs Height(in): 65 Pulse(bpm): 105 Weight(lbs): 180 Blood Pressure(mmHg): 144/84 Body Mass Index(BMI): 30 Temperature(F): 98.3 Respiratory Rate(breaths/min): 17 Photos: [1:No Photos Left, Lateral Foot] [N/A:N/A N/A] Wound Location: [1:Gradually Appeared] [N/A:N/A] Wounding Event: [1:Trauma, Other] [N/A:N/A] Primary Etiology: [1:07/13/2020] [N/A:N/A] Date Acquired: [1:0] [N/A:N/A] Weeks of Treatment: [1:Open] [N/A:N/A] Wound Status: [1:1x1.2x0.1] [N/A:N/A] Measurements L x W x D (cm) [1:0.942] [N/A:N/A] A (cm) : rea [1:0.094] [N/A:N/A] Volume (cm) : [1:Full Thickness Without Exposed] [N/A:N/A] Classification: [1:Support Structures Medium] [N/A:N/A] Exudate Amount: [1:Purulent] [N/A:N/A] Exudate Type: [1:yellow, brown, green] [N/A:N/A] Exudate Color: [1:Distinct, outline attached] [N/A:N/A] Wound Margin: [1:Large (67-100%)] [N/A:N/A] Granulation Amount: [1:Red, Pink] [N/A:N/A] Granulation Quality: [1:None Present (0%)] [N/A:N/A] Necrotic Amount: [1:Fascia: No] [N/A:N/A] Exposed Structures: [  1:Fat Layer (Subcutaneous Tissue): No Tendon: No Muscle: No Joint: No Bone: No Small (1-33%)] [N/A:N/A] Epithelialization: [1:Chemical/Enzymatic/Mechanical] [N/A:N/A] Debridement: [1:N/A] [N/A:N/A] Instrument: [1:None] [N/A:N/A] Bleeding: Debridement Treatment Response: Procedure was tolerated well [N/A:N/A] Post Debridement Measurements L x 1x1.2x0.1 [N/A:N/A] W x D (cm) [1:0.094] [N/A:N/A] Post Debridement Volume: (cm) [1:Debridement] [N/A:N/A] Treatment Notes Electronic Signature(s) Signed: 09/13/2020 4:10:40 PM By: Baltazar Najjar MD Signed: 09/13/2020 4:31:51 PM By: Zenaida Deed RN, BSN Entered By: Baltazar Najjar on 09/13/2020 14:42:33 -------------------------------------------------------------------------------- Multi-Disciplinary Care Plan Details Patient Name: Date of Service: Marie Myers CEA Myers 09/13/2020 1:15 PM Medical Record  Number: [id number] Patient Account Number: [id number] Date of Birth/Sex: Treating RN: 07/18/1991 (29 y.o. Female) Zenaida Deed Primary Care Yader Criger: [place] Other Clinician: Referring Adalbert Alberto: Treating Hjalmer Iovino/Extender: [name] in Treatment: 0 Active Inactive Abuse / Safety / Falls / Self Care Management Nursing Diagnoses: Potential for falls Goals: Patient/caregiver will verbalize/demonstrate measures taken to prevent injury and/or falls Date Initiated: 09/13/2020 Target Resolution Date: 10/11/2020 Goal Status: Active Interventions: Assess fall risk on admission and as needed Assess personal safety and home safety (as indicated) on admission and as needed Notes: Wound/Skin Impairment Nursing Diagnoses: Impaired tissue integrity Knowledge deficit related to ulceration/compromised skin integrity Goals: Patient/caregiver will verbalize understanding of skin care regimen Date Initiated: 09/13/2020 Target Resolution Date: 10/11/2020 Goal Status: Active Ulcer/skin breakdown will have a volume reduction of 30% by week 4 Date Initiated: 09/13/2020 Target Resolution Date: 10/11/2020 Goal Status: Active Interventions: Assess patient/caregiver ability to obtain necessary supplies Assess patient/caregiver ability to perform ulcer/skin care regimen upon admission and as needed Assess ulceration(s) every visit Provide education on ulcer and skin care Treatment Activities: Topical wound management initiated : 09/13/2020 Notes: Electronic Signature(s) Signed: 09/13/2020 4:31:51 PM By: Zenaida Deed RN, BSN Entered By: Zenaida Deed on 09/13/2020 14:16:42 -------------------------------------------------------------------------------- Pain Assessment Details Patient Name: Date of Service: Marie Myers CEA Myers 09/13/2020 1:15 PM Medical Record Number: [id number] Patient Account Number: [id number] Date of Birth/Sex: Treating RN: March 19, 1991 (30 y.o. Female)  Fonnie Mu Primary Care Adessa Primiano: [place], [place] Other Clinician: Referring Jaren Vanetten: Treating Venetta Knee/Extender: [name] in Treatment: 0 Active Problems Location of Pain Severity and Description of Pain Patient Has Paino Yes Site Locations Pain Location: Pain in Ulcers Duration of the Pain. Constant / Intermittento Intermittent Rate the pain. Current Pain Level: 9 Worst Pain Level: 10 Least Pain Level: 0 Tolerable Pain Level: 9 Character of Pain Describe the Pain: Aching Pain Management and Medication Current Pain Management: Medication: Yes Cold Application: No Rest: Yes Massage: No Activity: No T.E.N.S.: No Heat Application: No Leg drop or elevation: No Is the Current Pain Management Adequate: Adequate How does your wound impact your activities of daily livingo Sleep: No Bathing: No Appetite: No Relationship With Others: No Bladder Continence: No Emotions: No Bowel Continence: No Work: No Toileting: No Drive: No Dressing: No Hobbies: No Electronic Signature(s) Signed: 09/18/2020 5:56:51 PM By: Fonnie Mu RN Entered By: Fonnie Mu on 09/13/2020 13:16:25 -------------------------------------------------------------------------------- Patient/Caregiver Education Details Patient Name: Date of Service: Marie Myers CEA Myers 1/21/2022andnbsp1:15 PM Medical Record Number: [id number] Patient Account Number: [id number] Date of Birth/Gender: Treating RN: 1990/11/29 (30 y.o. Female) Zenaida Deed Primary Care Physician: [place] Other Clinician: Referring Physician: Treating Physician/Extender: [name] in Treatment: 0 Education Assessment Education Provided To: Patient Education Topics Provided Wound/Skin Impairment: Handouts: Caring for Your Ulcer, Skin Care Do's and Dont's Methods: Explain/Verbal, Printed Responses: Reinforcements needed, State content correctly Electronic Signature(s) Signed:  09/13/2020 4:31:51 PM By: Daneil Dan,  Legrand Como, BSN Entered By: Zenaida Deed on 09/13/2020 14:17:02 -------------------------------------------------------------------------------- Wound Assessment Details Patient Name: Date of Service: Marie Myers CEA Myers 09/13/2020 1:15 PM Medical Record Number: [id number] Patient Account Number: [id number] Date of Birth/Sex: Treating RN: December 03, 1990 (30 y.o. Female) Fonnie Mu Primary Care Ebon Ketchum: [place] Other Clinician: Referring Ennis Heavner: Treating Audelia Knape/Extender: [name] in Treatment: 0 Wound Status Wound Number: 1 Primary Etiology: Trauma, Other Wound Location: Left, Lateral Foot Wound Status: Open Wounding Event: Gradually Appeared Date Acquired: 07/13/2020 Weeks Of Treatment: 0 Clustered Wound: No Photos Photo Uploaded By: Benjaman Kindler on 09/18/2020 13:41:38 Wound Measurements Length: (cm) 1 Width: (cm) 1.2 Depth: (cm) 0.1 Area: (cm) 0.942 Volume: (cm) 0.094 % Reduction in Area: % Reduction in Volume: Epithelialization: Small (1-33%) Tunneling: No Undermining: No Wound Description Classification: Full Thickness Without Exposed Support Structures Wound Margin: Distinct, outline attached Exudate Amount: Medium Exudate Type: Purulent Exudate Color: yellow, brown, green Foul Odor After Cleansing: No Slough/Fibrino No Wound Bed Granulation Amount: Large (67-100%) Exposed Structure Granulation Quality: Red, Pink Fascia Exposed: No Necrotic Amount: None Present (0%) Fat Layer (Subcutaneous Tissue) Exposed: No Tendon Exposed: No Muscle Exposed: No Joint Exposed: No Bone Exposed: No Electronic Signature(s) Signed: 09/18/2020 5:56:51 PM By: Fonnie Mu RN Entered By: Fonnie Mu on 09/13/2020 13:39:17 -------------------------------------------------------------------------------- Vitals Details Patient Name: Date of Service: Marie Myers CEA Myers 09/13/2020 1:15 PM Medical Record  Number: [id number] Patient Account Number: [id number] Date of Birth/Sex: Treating RN: [birth year] (30 y.o. Female) Fonnie Mu Primary Care Lanijah Warzecha: [place] Other Clinician: Referring Trevia Nop: Treating Mcgwire Dasaro/Extender: [name] in Treatment: 0 Vital Signs Time Taken: 13:43 Temperature (F): 98.3 Height (in): 65 Pulse (bpm): 105 Source: Stated Respiratory Rate (breaths/min): 17 Weight (lbs): 180 Blood Pressure (mmHg): 144/84 Source: Stated Reference Range: 80 - 120 mg / dl Body Mass Index (BMI): 30 Electronic Signature(s) Signed: 09/18/2020 5:56:51 PM By: Fonnie Mu RN Entered By: Fonnie Mu on 09/13/2020 13:46:02

## 2020-09-18 NOTE — Progress Notes (Signed)
Marie Myers, Marie Myers ([id number]) Visit Report for 09/13/2020 Abuse/Suicide Risk Screen Details Patient Name: Date of Service: Marie Myers Myers NA 09/13/2020 1:15 PM Medical Record Number: [id number] Patient Account Number: [id number] Date of Birth/Sex: Treating RN: 1991/06/05 (30 y.o. Female) Fonnie Mu Primary Care [name]: [place] Other Clinician: Referring [name]: Treating [name]/Extender: [name] in Treatment: 0 Abuse/Suicide Risk Screen Items Answer ABUSE RISK SCREEN: Has anyone close to you tried to hurt or harm you recentlyo No Do you feel uncomfortable with anyone in your familyo No Has anyone forced you do things that you didnt want to doo No Electronic Signature(s) Signed: 09/18/2020 5:56:51 PM By: Fonnie Mu RN Entered By: Fonnie Mu on 09/13/2020 13:16:52 -------------------------------------------------------------------------------- Activities of Daily Living Details Patient Name: Date of Service: Marie Myers Myers NA 09/13/2020 1:15 PM Medical Record Number: [id number] Patient Account Number: [id number] Date of Birth/Sex: Treating RN: 1991/02/25 (30 y.o. Female) Fonnie Mu Primary Care [name]: [place] Other Clinician: Referring [name]: Treating [name]/Extender: [name] in Treatment: 0 Activities of Daily Living Items Answer Activities of Daily Living (Please select one for each item) Drive Automobile Completely Able T Medications ake Completely Able Use T elephone Completely Able Care for Appearance Completely Able Use T oilet Completely Able Bath / Shower Completely Able Dress Self Completely Able Feed Self Completely Able Walk Completely Able Get In / Out Bed Completely Able Housework Completely Able Prepare Meals Completely Able Handle Money Completely Able Shop for Self Completely Able Electronic Signature(s) Signed: 09/18/2020 5:56:51 PM By: Fonnie Mu RN Entered By:  Fonnie Mu on 09/13/2020 13:17:08 -------------------------------------------------------------------------------- Education Screening Details Patient Name: Date of Service: Marie Myers NA 09/13/2020 1:15 PM Medical Record Number: [id number] Patient Account Number: [id number] Date of Birth/Sex: Treating RN: August 14, 1991 (30 y.o. Female) Fonnie Mu Primary Care Arval Brandstetter: [place] Other Clinician: Referring Sausha Raymond: Treating Kairah Leoni/Extender: [name] in Treatment: 0 Primary Learner Assessed: Patient Learning Preferences/Education Level/Primary Language Learning Preference: Explanation, Demonstration, Communication Board, Printed Material Highest Education Level: College or Above Preferred Language: English Cognitive Barrier Language Barrier: No Translator Needed: No Memory Deficit: No Emotional Barrier: No Cultural/Religious Beliefs Affecting Medical Care: No Physical Barrier Impaired Vision: Yes Glasses Impaired Hearing: No Decreased Hand dexterity: No Knowledge/Comprehension Knowledge Level: High Comprehension Level: High Ability to understand written instructions: High Ability to understand verbal instructions: High Motivation Anxiety Level: Calm Cooperation: Cooperative Education Importance: Denies Need Interest in Health Problems: Asks Questions Perception: Coherent Willingness to Engage in Self-Management High Activities: Readiness to Engage in Self-Management High Activities: Electronic Signature(s) Signed: 09/18/2020 5:56:51 PM By: Fonnie Mu RN Entered By: Fonnie Mu on 09/13/2020 13:17:39 -------------------------------------------------------------------------------- Fall Risk Assessment Details Patient Name: Date of Service: Marie Myers NA 09/13/2020 1:15 PM Medical Record Number: [id number] Patient Account Number: [id number] Date of Birth/Sex: Treating RN: [birth year] (30 y.o. Female) Fonnie Mu Primary Care Bracy Pepper: [place] Other Clinician: Referring Dohn Stclair: Treating Yarissa Reining/Extender: [name] in Treatment: 0 Fall Risk Assessment Items Have you had 2 or more falls in the last 12 monthso 0 No Have you had any fall that resulted in injury in the last 12 monthso 0 Yes FALLS RISK SCREEN History of falling - immediate or within 3 months 25 Yes Secondary diagnosis (Do you have 2 or more medical diagnoseso) 0 No Ambulatory aid None/bed rest/wheelchair/nurse 0 No Crutches/cane/walker 0 No Furniture 0 No Intravenous therapy Access/Saline/Heparin Lock 0 No Gait/Transferring Normal/ bed rest/ wheelchair 0 No Weak (short steps with or without shuffle, stooped  but able to lift head while walking, may seek 0 No support from furniture) Impaired (short steps with shuffle, may have difficulty arising from chair, head down, impaired 0 No balance) Mental Status Oriented to own ability 0 No Electronic Signature(s) Signed: 09/18/2020 5:56:51 PM By: Fonnie Mu RN Entered By: Fonnie Mu on 09/13/2020 13:18:04 -------------------------------------------------------------------------------- Foot Assessment Details Patient Name: Date of Service: Marie Myers NA 09/13/2020 1:15 PM Medical Record Number: [id number] Patient Account Number: [id number] Date of Birth/Sex: Treating RN: [birth year] (30 y.o. Female) Fonnie Mu Primary Care Mckinna Demars: [place] Other Clinician: Referring Cambria Osten: Treating Teven Mittman/Extender: [name] in Treatment: 0 Foot Assessment Items Site Locations + = Sensation present, - = Sensation absent, C = Callus, U = Ulcer R = Redness, W = Warmth, M = Maceration, PU = Pre-ulcerative lesion F = Fissure, S = Swelling, D = Dryness Assessment Right: Left: Other Deformity: No No Prior Foot Ulcer: No No Prior Amputation: No No Charcot Joint: No No Ambulatory Status: Ambulatory Without Help Gait:  Steady Electronic Signature(s) Signed: 09/18/2020 5:56:51 PM By: Fonnie Mu RN Entered By: Fonnie Mu on 09/13/2020 13:42:50 -------------------------------------------------------------------------------- Nutrition Risk Screening Details Patient Name: Date of Service: Marie Myers NA 09/13/2020 1:15 PM Medical Record Number: [id number] Patient Account Number: [id number] Date of Birth/Sex: Treating RN: 02-Apr-1991 (30 y.o. Female) Fonnie Mu Primary Care Laurie Penado: [place] Other Clinician: Referring Breion Novacek: Treating Kiele Heavrin/Extender: [name] in Treatment: 0 Height (in): Weight (lbs): Body Mass Index (BMI): Nutrition Risk Screening Items Score Screening NUTRITION RISK SCREEN: I have an illness or condition that made me change the kind and/or amount of food I eat 0 No I eat fewer than two meals per day 0 No I eat few fruits and vegetables, or milk products 0 No I have three or more drinks of beer, liquor or wine almost every day 0 No I have tooth or mouth problems that make it hard for me to eat 0 No I don't always have enough money to buy the food I need 0 No I eat alone most of the time 0 No I take three or more different prescribed or over-the-counter drugs a day 0 No Without wanting to, I have lost or gained 10 pounds in the last six months 0 No I am not always physically able to shop, cook and/or feed myself 0 No Nutrition Protocols Good Risk Protocol 0 No interventions needed Moderate Risk Protocol High Risk Proctocol Risk Level: Good Risk Score: 0 Electronic Signature(s) Signed: 09/18/2020 5:56:51 PM By: Fonnie Mu RN Entered By: Fonnie Mu on 09/13/2020 13:18:16

## 2020-09-20 ENCOUNTER — Other Ambulatory Visit: Payer: Self-pay

## 2020-09-20 ENCOUNTER — Encounter (HOSPITAL_BASED_OUTPATIENT_CLINIC_OR_DEPARTMENT_OTHER): Payer: 59 | Admitting: Internal Medicine

## 2020-09-20 DIAGNOSIS — L97321 Non-pressure chronic ulcer of left ankle limited to breakdown of skin: Secondary | ICD-10-CM | POA: Diagnosis not present

## 2020-09-20 NOTE — Progress Notes (Signed)
ROISIN, MONES ([id number]) Visit Report for 09/20/2020 HPI Details Patient Name: Date of Service: [name] NA 09/20/2020 3:30 PM Medical Record Number: [id number] Patient Account Number: [id number] Date of Birth/Sex: Treating RN: 1990-09-09 (30 y.o. Tommye Standard Primary Care Provider: [facility] [place] Other Clinician: Referring Provider: Treating Provider/Extender: Lehman Prom, Jeanella Flattery in Treatment: 1 History of Present Illness HPI Description: ADMISSION 09/13/2020 This is a generally healthy 30 year old woman whose only relevant past medical history is a Roux-en-Y gastric bypass in 2016. She had some issues with refractory nausea and vomiting and abdominal pain and had a laparotomy on 08/11/2019 which I think was negative. Most recent admission was in December to [facility] again with refractory nausea and vomiting I do not think much was found. Her only medications are Phenergan and omeprazole. Her history is that she was celebrating her birthday on [birth year] and she fell going down stairs. I think she was inebriated although her did not exactly come out clearly and there may have been multiple falls. In any case the next morning she had a an open area on the left lateral malleolus extreme pain in the left ankle. 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 She was seen in the ER on 08/05/2020. I reviewed the x-rays of the left ankle and foot these were negative. She received an antibiotic from one of the doctors in her office although I am not sure which one that was. This may have been infected at one point. She is able to show me a series of pictures. This initially had a scabbed eschar then opened up but really has not changed much in the last month. She had a lot of swelling over this area that is recently come down however she still has very significant pain. This is complicated by the fact that she needs to stand on  this at work. She also describes numbness in the dorsal left foot and ankle extending into the lateral part of her calf. She says she cannot take narcotics or NSAIDs secondary to her gastric bypass hence she has been taking approximately 6 to 8 g of acetaminophen a day Past medical history other than what is stated above nothing contributory. ABI in our office was 1.02 on the left 09/20/2020; patient comes back into clinic with no real change in her symptoms. She has a small wound on the left lateral ankle some dark skin around this exquisitely tender and painful some soft tissue swelling. She also complains of numbness in her forefoot. The x-ray I did was read by the radiologist as possibly showing early mild resorptive changes at the fibular malleolus possibly early changes of osteomyelitis. Recommended consideration of an MRI. I would like to have orthopedics review the situation before ordering MRIs. I really do not know that this wound explains her pain or her numbness. She does not have an arterial issue. She has been using silver collagen to the wound Electronic Signature(s) Signed: 09/20/2020 5:33:44 PM By: Baltazar Najjar MD Entered By: Baltazar Najjar on 09/20/2020 17:17:09 -------------------------------------------------------------------------------- Physical Exam Details Patient Name: Date of Service: [name], [name] CEA NA 09/20/2020 3:30 PM Medical Record Number: [id number] Patient Account Number: [id number] Date of Birth/Sex: Treating RN: 24-Jul-1991 (30 y.o. Tommye Standard Primary Care Provider: [facility] [place] Other Clinician: Referring Provider: Treating Provider/Extender: Lehman Prom, Bethany Weeks in Treatment: 1 Cardiovascular Pedal pulses are palpable on the left. Musculoskeletal There is some swelling  over the left lateral ankle not particularly tender.. Notes Wound exam; small clean looking wound around the left lateral malleolus there is some circular dark  skin around this but I do not see this is being overtly infected. There is really no warmth but exquisitely tender over the wound area. She also complains of numbness in the dorsal foot up into the dorsal leg. Electronic Signature(s) Signed: 09/20/2020 5:33:44 PM By: Baltazar Najjar MD Entered By: Baltazar Najjar on 09/20/2020 17:18:54 -------------------------------------------------------------------------------- Physician Orders Details Patient Name: Date of Service: [name], [name] CEA NA 09/20/2020 3:30 PM Medical Record Number: [id number] Patient Account Number: [id number] Date of Birth/Sex: Treating RN: 05-Oct-1990 (30 y.o. 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 Bathing/ Shower/ Hygiene May shower and wash wound with soap and water. Wound Treatment Wound #1 - Foot Wound Laterality: Left, Lateral Prim Dressing: Promogran Prisma Matrix, 4.34 (sq in) (silver collagen) (Dispense As Written) Every Other Day/30 Days ary Discharge Instructions: Moisten collagen with saline or hydrogel Secondary Dressing: ComfortFoam Border, 4x4 in (silicone border) (Generic) Every Other Day/30 Days Discharge Instructions: Apply over primary dressing as directed. Secured With: Elastic Bandage 4 inch (ACE bandage) (Generic) Every Other Day/30 Days Discharge Instructions: Secure with ACE bandage as directed. Electronic Signature(s) Signed: 09/20/2020 5:33:44 PM By: Baltazar Najjar MD Signed: 09/20/2020 5:52:16 PM By: Zenaida Deed  RN, BSN Entered By: Zenaida Deed on 09/20/2020 16:12:32 -------------------------------------------------------------------------------- Problem List Details Patient Name: Date of Service: [name], [name] CEA NA 09/20/2020 3:30 PM Medical Record Number: [id number] Patient Account Number: [id number] Date of Birth/Sex: Treating RN: February 08, 1991 (29 y.o. 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 Tommye Standard Primary Care Provider: [place] [place] Other Clinician: Referring Provider: Treating Provider/Extender: Lehman Prom, Bethany Weeks in Treatment: 1 Subjective History of Present Illness (HPI) ADMISSION 09/13/2020 This is a generally healthy 30 year old woman whose only relevant past medical history is a Roux-en-Y gastric bypass in 2016. She had some issues with refractory nausea and vomiting and abdominal pain and had a laparotomy on 08/11/2019 which I think was negative. Most recent admission was in December to [facility] again with  refractory nausea and vomiting I do not think much was found. Her only medications are Phenergan and omeprazole. Her history is that she was celebrating her birthday on November 20 and she fell going down stairs. I think she was inebriated although her did not  exactly come out clearly and there may have been multiple falls. In any case the next morning she had a an open area on the left lateral malleolus extreme pain in the left ankle. 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 She was seen in the ER on 08/05/2020. I reviewed the x-rays of the left ankle and foot these were negative. She received an antibiotic from one of the doctors in her office although I am not sure which one that was. This may have been infected at one point. She is able to show me a series of pictures. This initially had a scabbed eschar then opened up but really has not changed much in the last month. She had a lot of swelling over this area that is recently come down however she still has very significant pain. This is complicated by the fact that she needs to stand on this at work. She also describes numbness in the dorsal left foot and ankle extending into the lateral part of her calf. She says she cannot take narcotics or NSAIDs secondary to her gastric bypass hence she has been taking approximately 6 to 8 g of acetaminophen a day Past medical history other than what is stated above nothing contributory. ABI in our office was 1.02 on the left 09/20/2020; patient comes back into clinic with no real change in her symptoms. She has a small wound on the left lateral ankle some dark skin around this exquisitely tender and painful some soft tissue swelling. She also complains of numbness in her forefoot. The x-ray I did was read by the radiologist as possibly showing early mild resorptive changes at the fibular malleolus possibly early changes of osteomyelitis. Recommended consideration of an MRI.  I would like to have orthopedics review the situation before ordering MRIs. I really do not know that this wound explains her pain or her numbness. She does not have an arterial issue. She has been using silver collagen to the wound Objective Constitutional Vitals Time Taken: 3:38 PM, Height: 65 in, Source: Stated, Weight: 180 lbs, Source: Stated, BMI: 30, Temperature: 98.6 F, Pulse: 83 bpm, Respiratory Rate: 18 breaths/min, Blood Pressure: 143/95 mmHg. Cardiovascular Pedal pulses are palpable on the left. Musculoskeletal There is some swelling over the left lateral ankle not particularly tender.. General Notes: Wound exam; small clean looking wound around the left lateral malleolus there is some circular dark skin around this but I do not see this is being overtly infected. There is really no warmth but exquisitely tender over the wound area. She also complains of numbness in the dorsal foot up into the dorsal leg. Integumentary (Hair, Skin) Wound #1 status is Open. Original cause of wound was Gradually Appeared. The wound is located on the Left,Lateral Foot. The wound measures 1cm length x 0.7cm width x 0.1cm depth; 0.55cm^2 area and 0.055cm^3 volume. There is Fat Layer (Subcutaneous Tissue) exposed. There is no tunneling or undermining noted. There is a medium amount of serosanguineous drainage noted. The wound margin is distinct with the outline attached to the wound base. There is large (67-100%) red, pink granulation within the wound bed. There is a small (1-33%) amount of necrotic tissue within the wound bed including Adherent Slough. 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 Bathing/ Shower/ Hygiene: May shower and wash wound with soap and  water. WOUND #1: - Foot Wound Laterality: Left, Lateral Prim Dressing: Promogran Prisma Matrix, 4.34 (sq in) (silver collagen) (Dispense As Written) Every Other Day/30 Days ary Discharge Instructions: Moisten collagen with saline or hydrogel Secondary Dressing: ComfortFoam Border, 4x4 in (silicone border) (Generic) Every Other Day/30 Days Discharge Instructions: Apply over primary dressing as directed. Secured With: Elastic Bandage 4 inch (ACE bandage) (Generic) Every Other Day/30 Days Discharge Instructions: Secure with ACE bandage as directed. 1. I am still continue with the silver collagen 2. I am reluctant to order an MRI in this situation I would like orthopedics to review her x-ray. I do not see any cortical erosion of the bone on the x-ray so I had like some confirmation. 3. I am also not certain what is causing this woman's pain or the numbness. I surmise that perhaps she damaged her common peroneal nerve at the fibular head as a cause of the numbness but the degree of discomfort she complains of seems out of keeping with the wound I am looking at. I do not see evidence of a significant infection and this has not changed all week 4. I wondered whether she has had a ligamentous tear/sprain but I cannot prove that. Electronic Signature(s) Signed: 09/20/2020 5:33:44 PM By: Baltazar Najjar MD Entered By: Baltazar Najjar on 09/20/2020 17:20:54 -------------------------------------------------------------------------------- SuperBill Details Patient Name: Date of Service: [name], [name] CEA NA 09/20/2020 Medical Record Number: [id number] Patient Account Number: [id number] Date of Birth/Sex: Treating RN: 1991-04-22 (30 y.o. 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

## 2020-09-20 NOTE — Progress Notes (Signed)
ZAILA, CREW ([id number]) Visit Report for 09/20/2020 Arrival Information Details Patient Name: Date of Service: Marie Myers CEA NA 09/20/2020 3:30 PM Medical Record Number: [id number] Patient Account Number: [id number] Date of Birth/Sex: Treating RN: [birth year] (30 y.o. Marie Myers Primary Care Anay Rathe: [place] Other Clinician: Referring Roshell Brigham: Treating Roby Donaway/Extender: Lehman Prom, Jeanella Flattery in Treatment: 1 Visit Information History Since Last Visit Added or deleted any medications: No Patient Arrived: Ambulatory Any new allergies or adverse reactions: No Arrival Time: 15:37 Had a fall or experienced change in No Accompanied By: fiance activities of daily living that may affect Transfer Assistance: None risk of falls: Patient Identification Verified: Yes Signs or symptoms of abuse/neglect since last visito No Secondary Verification Process Completed: Yes Hospitalized since last visit: No Patient Requires Transmission-Based Precautions: No Implantable device outside of the clinic excluding No Patient Has Alerts: No cellular tissue based products placed in the center since last visit: Has Dressing in Place as Prescribed: Yes Pain Present Now: Yes Electronic Signature(s) Signed: 09/20/2020 5:52:16 PM By: Zenaida Deed RN, BSN Entered By: Zenaida Deed on 09/20/2020 15:37:39 -------------------------------------------------------------------------------- Clinic Level of Care Assessment Details Patient Name: Date of Service: MO Marie Myers CEA NA 09/20/2020 3:30 PM Medical Record Number: [id number] Patient Account Number: [id number] Date of Birth/Sex: Treating RN: 1991/07/25 (30 y.o. Marie Myers Primary Care Keigan Girten: [place] Other Clinician: Referring Johnmark Geiger: Treating La Shehan/Extender: Lehman Prom, Jeanella Flattery in Treatment: 1 Clinic Level of Care Assessment Items TOOL 4 Quantity Score []  - 0 Use when only  an EandM is performed on FOLLOW-UP visit ASSESSMENTS - Nursing Assessment / Reassessment X- 1 10 Reassessment of Co-morbidities (includes updates in patient status) X- 1 5 Reassessment of Adherence to Treatment Plan ASSESSMENTS - Wound and Skin A ssessment / Reassessment X - Simple Wound Assessment / Reassessment - one wound 1 5 []  - 0 Complex Wound Assessment / Reassessment - multiple wounds []  - 0 Dermatologic / Skin Assessment (not related to wound area) ASSESSMENTS - Focused Assessment X- 1 5 Circumferential Edema Measurements - multi extremities []  - 0 Nutritional Assessment / Counseling / Intervention X- 1 5 Lower Extremity Assessment (monofilament, tuning fork, pulses) []  - 0 Peripheral Arterial Disease Assessment (using hand held doppler) ASSESSMENTS - Ostomy and/or Continence Assessment and Care []  - 0 Incontinence Assessment and Management []  - 0 Ostomy Care Assessment and Management (repouching, etc.) PROCESS - Coordination of Care X - Simple Patient / Family Education for ongoing care 1 15 []  - 0 Complex (extensive) Patient / Family Education for ongoing care X- 1 10 Staff obtains , Records, T Results / Process Orders est []  - 0 Staff telephones HHA, Nursing Homes / Clarify orders / etc []  - 0 Routine Transfer to another Facility (non-emergent condition) []  - 0 Routine Hospital Admission (non-emergent condition) []  - 0 New Admissions / / Ordering NPWT Apligraf, etc. , []  - 0 Emergency Hospital Admission (emergent condition) X- 1 10 Simple Discharge Coordination []  - 0 Complex (extensive) Discharge Coordination PROCESS - Special Needs []  - 0 Pediatric / Minor Patient Management []  - 0 Isolation Patient Management []  - 0 Hearing / Language / Visual special needs []  - 0 Assessment of Community assistance (transportation, D/C planning, etc.) []  - 0 Additional assistance / Altered mentation []  - 0 Support Surface(s)  Assessment (bed, cushion, seat, etc.) INTERVENTIONS - Wound Cleansing / Measurement X - Simple Wound Cleansing - one wound 1 5 []  - 0 Complex Wound Cleansing - multiple  wounds X- 1 5 Wound Imaging (photographs - any number of wounds) []  - 0 Wound Tracing (instead of photographs) X- 1 5 Simple Wound Measurement - one wound []  - 0 Complex Wound Measurement - multiple wounds INTERVENTIONS - Wound Dressings X - Small Wound Dressing one or multiple wounds 1 10 []  - 0 Medium Wound Dressing one or multiple wounds []  - 0 Large Wound Dressing one or multiple wounds X- 1 5 Application of Medications - topical []  - 0 Application of Medications - injection INTERVENTIONS - Miscellaneous []  - 0 External ear exam []  - 0 Specimen Collection (cultures, biopsies, blood, body fluids, etc.) []  - 0 Specimen(s) / Culture(s) sent or taken to Lab for analysis []  - 0 Patient Transfer (multiple staff / Nurse, adult / Similar devices) []  - 0 Simple Staple / Suture removal (25 or less) []  - 0 Complex Staple / Suture removal (26 or more) []  - 0 Hypo / Hyperglycemic Management (close monitor of Blood Glucose) []  - 0 Ankle / Brachial Index (ABI) - do not check if billed separately X- 1 5 Vital Signs Has the patient been seen at the hospital within the last three years: Yes Total Score: 100 Level Of Care: New/Established - Level 3 Electronic Signature(s) Signed: 09/20/2020 5:52:16 PM By: Zenaida Deed RN, BSN Entered By: Zenaida Deed on 09/20/2020 16:15:13 -------------------------------------------------------------------------------- Encounter Discharge Information Details Patient Name: Date of Service: MO Marie Myers CEA NA 09/20/2020 3:30 PM Medical Record Number: [id number] Patient Account Number: [id number] Date of Birth/Sex: Treating RN: 03-17-1991 (30 y.o. Arta Silence Primary Care Arrabella Westerman: [place] Other Clinician: Referring Liberato Stansbery: Treating Eliel Dudding/Extender: Lehman Prom, Jeanella Flattery in Treatment: 1 Encounter Discharge Information Items Discharge Condition: Stable Ambulatory Status: Ambulatory Discharge Destination: Home Transportation: Private Auto Accompanied By: self Schedule Follow-up Appointment: Yes Clinical Summary of Care: Electronic Signature(s) Signed: 09/20/2020 5:42:38 PM By: Shawn Stall Entered By: Shawn Stall on 09/20/2020 17:40:10 -------------------------------------------------------------------------------- Lower Extremity Assessment Details Patient Name: Date of Service: Marie Myers CEA NA 09/20/2020 3:30 PM Medical Record Number: [id number] Patient Account Number: [id number] Date of Birth/Sex: Treating RN: 1991-06-26 (30 y.o. Marie Myers Primary Care Nisa Decaire: [place] Other Clinician: Referring Genevive Printup: Treating Dyanne Yorks/Extender: Lehman Prom, Bethany Weeks in Treatment: 1 Edema Assessment Assessed: [Left: No] [Right: No] Edema: [Left: Ye] [Right: s] Calf Left: Right: Point of Measurement: From Medial Instep 40.5 cm Ankle Left: Right: Point of Measurement: From Medial Instep 22 cm Vascular Assessment Pulses: Dorsalis Pedis Palpable: [Left:Yes] Electronic Signature(s) Signed: 09/20/2020 5:52:16 PM By: Zenaida Deed RN, BSN Entered By: Zenaida Deed on 09/20/2020 15:46:28 -------------------------------------------------------------------------------- Multi Wound Chart Details Patient Name: Date of Service: MO Marie Myers CEA NA 09/20/2020 3:30 PM Medical Record Number: [id number] Patient Account Number: [id number] Date of Birth/Sex: Treating RN: 24-Jun-1991 (30 y.o. Marie Myers Primary Care Jamorris Ndiaye: [place] Other Clinician: Referring Nitisha Civello: Treating Braiden Rodman/Extender: Lehman Prom, Bethany Weeks in Treatment: 1 Vital Signs Height(in): 65 Pulse(bpm): 83 Weight(lbs): 180 Blood Pressure(mmHg): 143/95 Body Mass Index(BMI):  30 Temperature(F): 98.6 Respiratory Rate(breaths/min): 18 Photos: [1:No Photos Left, Lateral Foot] [N/A:N/A N/A] Wound Location: [1:Gradually Appeared] [N/A:N/A] Wounding Event: [1:Neuropathic Ulcer-Non Diabetic] [N/A:N/A] Primary Etiology: [1:07/13/2020] [N/A:N/A] Date Acquired: [1:1] [N/A:N/A] Weeks of Treatment: [1:Open] [N/A:N/A] Wound Status: [1:1x0.7x0.1] [N/A:N/A] Measurements L x W x D (cm) [1:0.55] [N/A:N/A] A (cm) : rea [1:0.055] [N/A:N/A] Volume (cm) : [1:41.60%] [N/A:N/A] % Reduction in A rea: [1:41.50%] [N/A:N/A] % Reduction in Volume: [1:Full Thickness Without Exposed] [N/A:N/A] Classification: [1:Support Structures Medium] [N/A:N/A]  Exudate Amount: [1:Serosanguineous] [N/A:N/A] Exudate Type: [1:red, brown] [N/A:N/A] Exudate Color: [1:Distinct, outline attached] [N/A:N/A] Wound Margin: [1:Large (67-100%)] [N/A:N/A] Granulation Amount: [1:Red, Pink] [N/A:N/A] Granulation Quality: [1:Small (1-33%)] [N/A:N/A] Necrotic Amount: [1:Fat Layer (Subcutaneous Tissue): Yes N/A] Exposed Structures: [1:Fascia: No Tendon: No Muscle: No Joint: No Bone: No Small (1-33%)] [N/A:N/A] Treatment Notes Electronic Signature(s) Signed: 09/20/2020 5:33:44 PM By: Baltazar Najjar MD Signed: 09/20/2020 5:52:16 PM By: Zenaida Deed RN, BSN Entered By: Baltazar Najjar on 09/20/2020 17:13:23 -------------------------------------------------------------------------------- Multi-Disciplinary Care Plan Details Patient Name: Date of Service: MO Marie Myers CEA NA 09/20/2020 3:30 PM Medical Record Number: [id number] Patient Account Number: [id number] Date of Birth/Sex: Treating RN: 1991/02/13 (29 y.o. Marie Myers Primary Care Ercel Pepitone: [place] Other Clinician: Referring Elzie Knisley: Treating Brocha Gilliam/Extender: Lehman Prom, Jeanella Flattery in Treatment: 1 Active Inactive Abuse / Safety / Falls / Self Care Management Nursing Diagnoses: Potential for  falls Goals: Patient/caregiver will verbalize/demonstrate measures taken to prevent injury and/or falls Date Initiated: 09/13/2020 Target Resolution Date: 10/11/2020 Goal Status: Active Interventions: Assess fall risk on admission and as needed Assess personal safety and home safety (as indicated) on admission and as needed Notes: Wound/Skin Impairment Nursing Diagnoses: Impaired tissue integrity Knowledge deficit related to ulceration/compromised skin integrity Goals: Patient/caregiver will verbalize understanding of skin care regimen Date Initiated: 09/13/2020 Target Resolution Date: 10/11/2020 Goal Status: Active Ulcer/skin breakdown will have a volume reduction of 30% by week 4 Date Initiated: 09/13/2020 Target Resolution Date: 10/11/2020 Goal Status: Active Interventions: Assess patient/caregiver ability to obtain necessary supplies Assess patient/caregiver ability to perform ulcer/skin care regimen upon admission and as needed Assess ulceration(s) every visit Provide education on ulcer and skin care Treatment Activities: Topical wound management initiated : 09/13/2020 Notes: Electronic Signature(s) Signed: 09/20/2020 5:52:16 PM By: Zenaida Deed RN, BSN Entered By: Zenaida Deed on 09/20/2020 15:47:31 -------------------------------------------------------------------------------- Pain Assessment Details Patient Name: Date of Service: MO Marie Myers CEA NA 09/20/2020 3:30 PM Medical Record Number: [id number] Patient Account Number: [id number] Date of Birth/Sex: Treating RN: 1991-02-07 (31 y.o. Marie Myers Primary Care Haytham Maher: [place] Other Clinician: Referring Noreene Boreman: Treating Chantavia Bazzle/Extender: Lehman Prom, Bethany Weeks in Treatment: 1 Active Problems Location of Pain Severity and Description of Pain Patient Has Paino Yes Site Locations Pain Location: Pain in Ulcers With Dressing Change: Yes Duration of the Pain. Constant /  Intermittento Constant Rate the pain. Current Pain Level: 9 Worst Pain Level: 10 Least Pain Level: 5 Character of Pain Describe the Pain: Other: stinging Pain Management and Medication Current Pain Management: Medication: Yes Is the Current Pain Management Adequate: Adequate Rest: Yes How does your wound impact your activities of daily livingo Sleep: Yes Bathing: No Appetite: No Relationship With Others: No Bladder Continence: No Emotions: Yes Bowel Continence: No Work: No Toileting: No Drive: No Dressing: No Hobbies: No Electronic Signature(s) Signed: 09/20/2020 5:52:16 PM By: Zenaida Deed RN, BSN Entered By: Zenaida Deed on 09/20/2020 15:45:11 -------------------------------------------------------------------------------- Patient/Caregiver Education Details Patient Name: Date of Service: MO Marie Myers CEA NA 1/28/2022andnbsp3:30 PM Medical Record Number: [id number] Patient Account Number: [id number] Date of Birth/Gender: Treating RN: 1991/07/08 (30 y.o. Marie Myers Primary Care Physician: [place] Other Clinician: Referring Physician: Treating Physician/Extender: Lehman Prom, Jeanella Flattery in Treatment: 1 Education Assessment Education Provided To: Patient Education Topics Provided Wound/Skin Impairment: Methods: Explain/Verbal Responses: Reinforcements needed, State content correctly Electronic Signature(s) Signed: 09/20/2020 5:52:16 PM By: Zenaida Deed RN, BSN Entered By: Zenaida Deed on 09/20/2020 15:47:46 -------------------------------------------------------------------------------- Wound Assessment Details Patient Name: Date of Service: [name],  [name] 09/20/2020 3:30 PM Medical Record Number: [id number] Patient Account Number: [id number] Date of Birth/Sex: Treating RN: September 30, 1990 (30 y.o. Marie Myers Primary Care Luba Matzen: [place] Other Clinician: Referring Elek Holderness: Treating Carolene Gitto/Extender:  Lehman Prom, Bethany Weeks in Treatment: 1 Wound Status Wound Number: 1 Primary Etiology: Neuropathic Ulcer-Non Diabetic Wound Location: Left, Lateral Foot Wound Status: Open Wounding Event: Gradually Appeared Date Acquired: 07/13/2020 Weeks Of Treatment: 1 Clustered Wound: No Wound Measurements Length: (cm) 1 Width: (cm) 0.7 Depth: (cm) 0.1 Area: (cm) 0.55 Volume: (cm) 0.055 % Reduction in Area: 41.6% % Reduction in Volume: 41.5% Epithelialization: Small (1-33%) Tunneling: No Undermining: No Wound Description Classification: Full Thickness Without Exposed Support Structures Wound Margin: Distinct, outline attached Exudate Amount: Medium Exudate Type: Serosanguineous Exudate Color: red, brown Foul Odor After Cleansing: No Slough/Fibrino No Wound Bed Granulation Amount: Large (67-100%) Exposed Structure Granulation Quality: Red, Pink Fascia Exposed: No Necrotic Amount: Small (1-33%) Fat Layer (Subcutaneous Tissue) Exposed: Yes Necrotic Quality: Adherent Slough Tendon Exposed: No Muscle Exposed: No Joint Exposed: No Bone Exposed: No Treatment Notes Wound #1 (Foot) Wound Laterality: Left, Lateral Cleanser Peri-Wound Care Topical Primary Dressing Promogran Prisma Matrix, 4.34 (sq in) (silver collagen) Discharge Instruction: Moisten collagen with saline or hydrogel Secondary Dressing ComfortFoam Border, 4x4 in (silicone border) Discharge Instruction: Apply over primary dressing as directed. Secured With Elastic Bandage 4 inch (ACE bandage) Discharge Instruction: Secure with ACE bandage as directed. Compression Wrap Compression Stockings Add-Ons Electronic Signature(s) Signed: 09/20/2020 5:52:16 PM By: Zenaida Deed RN, BSN Entered By: Zenaida Deed on 09/20/2020 15:47:06 -------------------------------------------------------------------------------- Vitals Details Patient Name: Date of Service: MO Marie Myers CEA NA 09/20/2020 3:30 PM Medical  Record Number: [id number] Patient Account Number: [id number] Date of Birth/Sex: Treating RN: 05/17/1991 (30 y.o. Marie Myers Primary Care Tobin Cadiente: [place] Other Clinician: Referring Shahram Alexopoulos: Treating Dejuan Elman/Extender: Lehman Prom, Bethany Weeks in Treatment: 1 Vital Signs Time Taken: 15:38 Temperature (F): 98.6 Height (in): 65 Pulse (bpm): 83 Source: Stated Respiratory Rate (breaths/min): 18 Weight (lbs): 180 Blood Pressure (mmHg): 143/95 Source: Stated Reference Range: 80 - 120 mg / dl Body Mass Index (BMI): 30 Electronic Signature(s) Signed: 09/20/2020 5:52:16 PM By: Zenaida Deed RN, BSN Entered By: Zenaida Deed on 09/20/2020 15:39:55

## 2020-10-04 ENCOUNTER — Encounter (HOSPITAL_BASED_OUTPATIENT_CLINIC_OR_DEPARTMENT_OTHER): Payer: 59 | Attending: Internal Medicine | Admitting: Internal Medicine

## 2020-10-15 ENCOUNTER — Encounter (HOSPITAL_COMMUNITY): Payer: Self-pay

## 2020-10-15 ENCOUNTER — Emergency Department (HOSPITAL_COMMUNITY): Payer: 59

## 2020-10-15 ENCOUNTER — Other Ambulatory Visit: Payer: Self-pay

## 2020-10-15 ENCOUNTER — Emergency Department (HOSPITAL_COMMUNITY)
Admission: EM | Admit: 2020-10-15 | Discharge: 2020-10-16 | Disposition: A | Discharge status: Home / Self Care | Payer: 59 | Source: Home / Self Care | Attending: Emergency Medicine | Admitting: Emergency Medicine

## 2020-10-15 DIAGNOSIS — R112 Nausea with vomiting, unspecified: Secondary | ICD-10-CM

## 2020-10-15 DIAGNOSIS — E860 Dehydration: Secondary | ICD-10-CM | POA: Diagnosis not present

## 2020-10-15 DIAGNOSIS — R1013 Epigastric pain: Secondary | ICD-10-CM

## 2020-10-15 DIAGNOSIS — R110 Nausea: Secondary | ICD-10-CM

## 2020-10-15 LAB — COMPREHENSIVE METABOLIC PANEL
ALT: 15 U/L (ref 0–44)
AST: 21 U/L (ref 15–41)
Albumin: 5.2 g/dL — ABNORMAL HIGH (ref 3.5–5.0)
Alkaline Phosphatase: 70 U/L (ref 38–126)
Anion gap: 14 (ref 5–15)
BUN: 10 mg/dL (ref 6–20)
CO2: 23 mmol/L (ref 22–32)
Calcium: 9.9 mg/dL (ref 8.9–10.3)
Chloride: 104 mmol/L (ref 98–111)
Creatinine, Ser: 0.59 mg/dL (ref 0.44–1.00)
GFR, Estimated: >60 mL/min (ref >60)
Glucose, Bld: 94 mg/dL (ref 70–99)
Potassium: 3.9 mmol/L (ref 3.5–5.1)
Sodium: 141 mmol/L (ref 135–145)
Total Bilirubin: 0.9 mg/dL (ref 0.3–1.2)
Total Protein: 8.9 g/dL — ABNORMAL HIGH (ref 6.5–8.1)

## 2020-10-15 LAB — URINALYSIS, ROUTINE W REFLEX MICROSCOPIC
Bilirubin Urine: NEGATIVE
Glucose, UA: NEGATIVE mg/dL
Ketones, ur: 20 mg/dL — AB
Leukocytes,Ua: NEGATIVE
Nitrite: NEGATIVE
Protein, ur: 100 mg/dL — AB
Specific Gravity, Urine: 1.018 (ref 1.005–1.030)
pH: 6 (ref 5.0–8.0)

## 2020-10-15 LAB — CBC
HCT: 45.9 % (ref 36.0–46.0)
Hemoglobin: 15.4 g/dL — ABNORMAL HIGH (ref 12.0–15.0)
MCH: 35.6 pg — ABNORMAL HIGH (ref 26.0–34.0)
MCHC: 33.6 g/dL (ref 30.0–36.0)
MCV: 106 fL — ABNORMAL HIGH (ref 80.0–100.0)
Platelets: 317 10*3/uL (ref 150–400)
RBC: 4.33 MIL/uL (ref 3.87–5.11)
RDW: 11.3 % — ABNORMAL LOW (ref 11.5–15.5)
WBC: 12.3 10*3/uL — ABNORMAL HIGH (ref 4.0–10.5)
nRBC: 0 % (ref 0.0–0.2)

## 2020-10-15 LAB — I-STAT BETA HCG BLOOD, ED (NOT ORDERABLE): I-stat hCG, quantitative: <5 m[IU]/mL (ref <5)

## 2020-10-15 LAB — LIPASE, BLOOD: Lipase: 27 U/L (ref 11–51)

## 2020-10-15 LAB — TROPONIN I (HIGH SENSITIVITY): Troponin I (High Sensitivity): 6 ng/L (ref <18)

## 2020-10-15 MED ORDER — FENTANYL CITRATE (PF) 100 MCG/2ML IJ SOLN
50.0000 ug | Freq: Once | INTRAMUSCULAR | Status: AC
  Administered 2020-10-15: 50 ug via INTRAVENOUS
  Filled 2020-10-15: qty 2

## 2020-10-15 MED ORDER — IOHEXOL 300 MG/ML  SOLN
100.0000 mL | Freq: Once | INTRAMUSCULAR | Status: AC | PRN
  Administered 2020-10-15: 100 mL via INTRAVENOUS

## 2020-10-15 MED ORDER — DROPERIDOL 2.5 MG/ML IJ SOLN
1.2500 mg | Freq: Once | INTRAMUSCULAR | Status: AC
  Administered 2020-10-15: 1.25 mg via INTRAVENOUS
  Filled 2020-10-15: qty 2

## 2020-10-15 MED ORDER — SODIUM CHLORIDE 0.9 % IV BOLUS
1000.0000 mL | Freq: Once | INTRAVENOUS | Status: AC
  Administered 2020-10-15: 1000 mL via INTRAVENOUS

## 2020-10-15 NOTE — ED Provider Notes (Signed)
[facility]-EMERGENCY DEPT Provider Note   CSN: [id number] Arrival date & time: 10/15/20  1538     History Chief Complaint  Patient presents with  . Abdominal Pain  . Chest Pain    Marie Myers is a 30 y.o. female.  Presented to the emergency room with concern for abdominal pain.  Patient states that she suffers from episodic abdominal pain frequently.  Current episode started on Sunday, worse yesterday and today.  Pain is severe, sharp, stabbing pain.  Worse in her upper abdomen, also having some pain in her lower abdomen, right lower side as well as in her lower chest.  Has not been able to take any medicine today due to her nausea.  HPI     History reviewed. No pertinent past medical history.  Patient Active Problem List   Diagnosis Date Noted  . Abdominal pain 08/11/2019    Past Surgical History:  Procedure Laterality Date  . CHOLECYSTECTOMY    . GASTRIC BYPASS    . LAPAROSCOPY N/A 08/11/2019   Procedure: LAPAROSCOPY DIAGNOSTIC;  Surgeon: Harriette Bouillon, MD;  Location: [facility] OR;  Service: General;  Laterality: N/A;     OB History   No obstetric history on file.     History reviewed. No pertinent family history.  Social History   Tobacco Use  . Smoking status: Never Smoker  . Smokeless tobacco: Never Used  Substance Use Topics  . Alcohol use: Yes    Comment: occasionally  . Drug use: Never    Home Medications Prior to Admission medications   Medication Sig Start Date End Date Taking? Authorizing Provider  amitriptyline (ELAVIL) 100 MG tablet Take 100 mg by mouth at bedtime as needed for sleep. 10/14/20  Yes [provider]  omeprazole (PRILOSEC) 20 MG capsule Take 40 mg by mouth daily.   Yes [provider]  promethazine (PHENERGAN) 25 MG tablet Take 1 tablet (25 mg total) by mouth every 6 (six) hours as needed for nausea or vomiting. 06/26/20  Yes Mare Ferrari, PA-C  traMADol (ULTRAM) 50 MG tablet Take 50 mg by  mouth every 6 (six) hours as needed for moderate pain. 09/17/20  Yes [provider]    Allergies    Zithromax [azithromycin]  Review of Systems   Review of Systems  Constitutional: Negative for chills and fever.  HENT: Negative for ear pain and sore throat.   Eyes: Negative for pain and visual disturbance.  Respiratory: Negative for cough and shortness of breath.   Cardiovascular: Negative for chest pain and palpitations.  Gastrointestinal: Positive for abdominal pain, nausea and vomiting.  Genitourinary: Negative for dysuria and hematuria.  Musculoskeletal: Negative for arthralgias and back pain.  Skin: Negative for color change and rash.  Neurological: Negative for seizures and syncope.  All other systems reviewed and are negative.   Physical Exam Updated Vital Signs BP (!) 140/100   Pulse 87   Temp 97.7 F (36.5 C)   Resp 16   LMP 10/06/2020   SpO2 100%   Physical Exam Vitals and nursing note reviewed.  Constitutional:      General: She is not in acute distress.    Appearance: She is well-developed and well-nourished.  HENT:     Head: Normocephalic and atraumatic.  Eyes:     Conjunctiva/sclera: Conjunctivae normal.  Cardiovascular:     Rate and Rhythm: Normal rate and regular rhythm.     Heart sounds: No murmur heard.   Pulmonary:  Effort: Pulmonary effort is normal. No respiratory distress.     Breath sounds: Normal breath sounds.  Abdominal:     Palpations: Abdomen is soft.     Tenderness: There is no abdominal tenderness.  Musculoskeletal:        General: No edema.     Cervical back: Neck supple.  Skin:    General: Skin is warm and dry.  Neurological:     Mental Status: She is alert.  Psychiatric:        Mood and Affect: Mood and affect normal.     ED Results / Procedures / Treatments   Labs (all labs ordered are listed, but only abnormal results are displayed) Labs Reviewed  COMPREHENSIVE METABOLIC PANEL - Abnormal; Notable for the  following components:      Result Value   Total Protein 8.9 (*)    Albumin 5.2 (*)    All other components within normal limits  CBC - Abnormal; Notable for the following components:   WBC 12.3 (*)    Hemoglobin 15.4 (*)    MCV 106.0 (*)    MCH 35.6 (*)    RDW 11.3 (*)    All other components within normal limits  URINALYSIS, ROUTINE W REFLEX MICROSCOPIC - Abnormal; Notable for the following components:   Color, Urine AMBER (*)    APPearance HAZY (*)    Hgb urine dipstick SMALL (*)    Ketones, ur 20 (*)    Protein, ur 100 (*)    Bacteria, UA MANY (*)    All other components within normal limits  LIPASE, BLOOD  I-STAT BETA HCG BLOOD, ED ([facility], [facility], [facility] ONLY)  I-STAT BETA HCG BLOOD, ED (NOT ORDERABLE)  TROPONIN I (HIGH SENSITIVITY)    EKG None  Radiology DG Chest 2 View  Result Date: 10/15/2020 CLINICAL DATA:  Chest pain. EXAM: CHEST - 2 VIEW COMPARISON:  June 26, 2020. FINDINGS: The heart size and mediastinal contours are within normal limits. Both lungs are clear. No pneumothorax or pleural effusion is noted. The visualized skeletal structures are unremarkable. IMPRESSION: No active cardiopulmonary disease. Electronically Signed   By: Lupita Raider M.D.   On: 10/15/2020 16:45   CT ABDOMEN PELVIS W CONTRAST  Result Date: 10/15/2020 CLINICAL DATA:  Abdominal pain. EXAM: CT ABDOMEN AND PELVIS WITH CONTRAST TECHNIQUE: Multidetector CT imaging of the abdomen and pelvis was performed using the standard protocol following bolus administration of intravenous contrast. CONTRAST:  OMNIPAQUE IOHEXOL 300 MG/ML  SOLN COMPARISON:  August 19, 2020 FINDINGS: Lower chest: No acute abnormality. Hepatobiliary: No focal liver abnormality is seen. Status post cholecystectomy. No biliary dilatation. Pancreas: Unremarkable. No pancreatic ductal dilatation or surrounding inflammatory changes. Spleen: Normal in size without focal abnormality. Adrenals/Urinary Tract: Adrenal glands are  unremarkable. Kidneys are normal, without renal calculi, focal lesion, or hydronephrosis. Bladder is unremarkable. Stomach/Bowel: There is a small hiatal hernia. Multiple surgical sutures are seen within the gastric region. Appendix appears normal. No evidence of bowel wall thickening, distention, or inflammatory changes. Vascular/Lymphatic: No significant vascular findings are present. No enlarged abdominal or pelvic lymph nodes. Reproductive: Uterus and bilateral adnexa are unremarkable. Other: No abdominal wall hernia or abnormality. No abdominopelvic ascites. Musculoskeletal: No acute or significant osseous findings. IMPRESSION: 1. Evidence of prior gastric bypass surgery. 2. Small hiatal hernia. 3. Evidence of prior cholecystectomy. Electronically Signed   By: Aram Candela M.D.   On: 10/15/2020 23:24    Procedures Procedures   Medications Ordered in ED Medications  sodium chloride 0.9 % bolus 1,000 mL (0 mLs Intravenous Stopped 10/16/20 0017)  fentaNYL (SUBLIMAZE) injection 50 mcg (50 mcg Intravenous Given 10/15/20 2236)  droperidol (INAPSINE) 2.5 MG/ML injection 1.25 mg (1.25 mg Intravenous Given 10/15/20 2235)  iohexol (OMNIPAQUE) 300 MG/ML solution 100 mL (100 mLs Intravenous Contrast Given 10/15/20 2246)    ED Course  I have reviewed the triage vital signs and the nursing notes.  Pertinent labs & imaging results that were available during my care of the patient were reviewed by me and considered in my medical decision making (see chart for details).  Clinical Course as of 10/16/20 0031  Wed Oct 16, 2020  0005 Symptoms completely resolved, will p.o. challenge and discharge home [RD]    Clinical Course User Index [RD] Milagros Loll, MD   MDM Rules/Calculators/A&P                         30 year old lady with history of gastric bypass presents to ER with concern for generalized abdominal pain, nausea and vomiting.  On initial assessment, patient appears somewhat uncomfortable  but in no distress, vital signs were stable.  Her blood work was notable for mild leukocytosis.  Some bacteria in urine but no leukocytes, negative nitrites, no WBCs, doubt UTI.  CT abdomen pelvis was obtained to further investigate, this was negative for any acute pathology.  Patient was provided symptomatic control with droperidol, fentanyl and fluids.  On reassessment, her symptoms had completely resolved and her abdominal exam was benign.  She has history of prior episodes, states that she has previously had EGD that was negative.  Given the reassuring work-up today and her reassuring appearance at present, believe she can be discharged and managed in the outpatient setting.  Recommended follow-up with primary and GI.   After the discussed management above, the patient was determined to be safe for discharge.  The patient was in agreement with this plan and all questions regarding their care were answered.  ED return precautions were discussed and the patient will return to the ED with any significant worsening of condition.   Final Clinical Impression(s) / ED Diagnoses Final diagnoses:  Epigastric abdominal pain  Nausea  Nausea and vomiting, intractability of vomiting not specified, unspecified vomiting type    Rx / DC Orders ED Discharge Orders    None       Milagros Loll, MD 10/16/20 [phone]

## 2020-10-15 NOTE — ED Notes (Addendum)
Patients EKG was not showing up in her chart or on the monitor at the nurses station. EKG was re-done, exported, and given to Dr. Stevie Kern.

## 2020-10-15 NOTE — ED Triage Notes (Signed)
Pt presents with c/o abdominal pain since Sunday and chest pain that started today. Pt reports the pain in her abdomen has been worse today. Pt reports some n/v as well.

## 2020-10-16 ENCOUNTER — Emergency Department (HOSPITAL_COMMUNITY): Payer: 59

## 2020-10-16 ENCOUNTER — Inpatient Hospital Stay (HOSPITAL_COMMUNITY)
Admission: EM | Admit: 2020-10-16 | Discharge: 2020-10-18 | DRG: 392 | Disposition: A | Discharge status: Home / Self Care | Payer: 59 | Attending: Internal Medicine | Admitting: Internal Medicine

## 2020-10-16 ENCOUNTER — Encounter (HOSPITAL_COMMUNITY): Payer: Self-pay

## 2020-10-16 ENCOUNTER — Other Ambulatory Visit: Payer: Self-pay

## 2020-10-16 DIAGNOSIS — Z9884 Bariatric surgery status: Secondary | ICD-10-CM | POA: Diagnosis not present

## 2020-10-16 DIAGNOSIS — R1084 Generalized abdominal pain: Secondary | ICD-10-CM | POA: Diagnosis not present

## 2020-10-16 DIAGNOSIS — F1220 Cannabis dependence, uncomplicated: Secondary | ICD-10-CM | POA: Diagnosis present

## 2020-10-16 DIAGNOSIS — R112 Nausea with vomiting, unspecified: Principal | ICD-10-CM | POA: Diagnosis present

## 2020-10-16 DIAGNOSIS — N390 Urinary tract infection, site not specified: Secondary | ICD-10-CM | POA: Diagnosis present

## 2020-10-16 DIAGNOSIS — Z20822 Contact with and (suspected) exposure to covid-19: Secondary | ICD-10-CM | POA: Diagnosis present

## 2020-10-16 DIAGNOSIS — R0682 Tachypnea, not elsewhere classified: Secondary | ICD-10-CM | POA: Diagnosis present

## 2020-10-16 DIAGNOSIS — Z9049 Acquired absence of other specified parts of digestive tract: Secondary | ICD-10-CM | POA: Diagnosis not present

## 2020-10-16 DIAGNOSIS — R109 Unspecified abdominal pain: Secondary | ICD-10-CM | POA: Diagnosis present

## 2020-10-16 DIAGNOSIS — I160 Hypertensive urgency: Secondary | ICD-10-CM | POA: Diagnosis present

## 2020-10-16 DIAGNOSIS — E860 Dehydration: Secondary | ICD-10-CM | POA: Diagnosis present

## 2020-10-16 DIAGNOSIS — Z79899 Other long term (current) drug therapy: Secondary | ICD-10-CM | POA: Diagnosis not present

## 2020-10-16 DIAGNOSIS — Z881 Allergy status to other antibiotic agents status: Secondary | ICD-10-CM | POA: Diagnosis not present

## 2020-10-16 DIAGNOSIS — R1013 Epigastric pain: Secondary | ICD-10-CM

## 2020-10-16 LAB — COMPREHENSIVE METABOLIC PANEL
ALT: 13 U/L (ref 0–44)
AST: 22 U/L (ref 15–41)
Albumin: 4.8 g/dL (ref 3.5–5.0)
Alkaline Phosphatase: 66 U/L (ref 38–126)
Anion gap: 14 (ref 5–15)
BUN: 9 mg/dL (ref 6–20)
CO2: 21 mmol/L — ABNORMAL LOW (ref 22–32)
Calcium: 9.2 mg/dL (ref 8.9–10.3)
Chloride: 98 mmol/L (ref 98–111)
Creatinine, Ser: 0.65 mg/dL (ref 0.44–1.00)
GFR, Estimated: >60 mL/min (ref >60)
Glucose, Bld: 115 mg/dL — ABNORMAL HIGH (ref 70–99)
Potassium: 3.7 mmol/L (ref 3.5–5.1)
Sodium: 133 mmol/L — ABNORMAL LOW (ref 135–145)
Total Bilirubin: 1.8 mg/dL — ABNORMAL HIGH (ref 0.3–1.2)
Total Protein: 8.3 g/dL — ABNORMAL HIGH (ref 6.5–8.1)

## 2020-10-16 LAB — URINALYSIS, ROUTINE W REFLEX MICROSCOPIC
Bilirubin Urine: NEGATIVE
Glucose, UA: NEGATIVE mg/dL
Ketones, ur: 80 mg/dL — AB
Leukocytes,Ua: NEGATIVE
Nitrite: NEGATIVE
Protein, ur: 100 mg/dL — AB
Specific Gravity, Urine: 1.033 — ABNORMAL HIGH (ref 1.005–1.030)
pH: 5 (ref 5.0–8.0)

## 2020-10-16 LAB — CBC WITH DIFFERENTIAL/PLATELET
Abs Immature Granulocytes: 0.04 10*3/uL (ref 0.00–0.07)
Basophils Absolute: 0 10*3/uL (ref 0.0–0.1)
Basophils Relative: 0 %
Eosinophils Absolute: 0 10*3/uL (ref 0.0–0.5)
Eosinophils Relative: 0 %
HCT: 43.9 % (ref 36.0–46.0)
Hemoglobin: 15.4 g/dL — ABNORMAL HIGH (ref 12.0–15.0)
Immature Granulocytes: 0 %
Lymphocytes Relative: 12 %
Lymphs Abs: 1.6 10*3/uL (ref 0.7–4.0)
MCH: 36.2 pg — ABNORMAL HIGH (ref 26.0–34.0)
MCHC: 35.1 g/dL (ref 30.0–36.0)
MCV: 103.3 fL — ABNORMAL HIGH (ref 80.0–100.0)
Monocytes Absolute: 1.2 10*3/uL — ABNORMAL HIGH (ref 0.1–1.0)
Monocytes Relative: 9 %
Neutro Abs: 10.3 10*3/uL — ABNORMAL HIGH (ref 1.7–7.7)
Neutrophils Relative %: 79 %
Platelets: 313 10*3/uL (ref 150–400)
RBC: 4.25 MIL/uL (ref 3.87–5.11)
RDW: 11.6 % (ref 11.5–15.5)
WBC: 13.3 10*3/uL — ABNORMAL HIGH (ref 4.0–10.5)
nRBC: 0 % (ref 0.0–0.2)

## 2020-10-16 LAB — LIPASE, BLOOD: Lipase: 25 U/L (ref 11–51)

## 2020-10-16 LAB — RAPID URINE DRUG SCREEN, HOSP PERFORMED
Amphetamines: NOT DETECTED
Barbiturates: NOT DETECTED
Benzodiazepines: NOT DETECTED
Cocaine: NOT DETECTED
Opiates: NOT DETECTED
Tetrahydrocannabinol: POSITIVE — AB

## 2020-10-16 LAB — TSH: TSH: 1.454 u[IU]/mL (ref 0.350–4.500)

## 2020-10-16 MED ORDER — SODIUM CHLORIDE 0.9 % IV BOLUS
1000.0000 mL | Freq: Once | INTRAVENOUS | Status: AC
  Administered 2020-10-16: 1000 mL via INTRAVENOUS

## 2020-10-16 MED ORDER — HALOPERIDOL LACTATE 5 MG/ML IJ SOLN
5.0000 mg | Freq: Once | INTRAMUSCULAR | Status: AC
  Administered 2020-10-16: 5 mg via INTRAVENOUS
  Filled 2020-10-16: qty 1

## 2020-10-16 MED ORDER — MORPHINE SULFATE (PF) 4 MG/ML IV SOLN
4.0000 mg | Freq: Once | INTRAVENOUS | Status: AC
Start: 2020-10-16 — End: 2020-10-16
  Administered 2020-10-16: 4 mg via INTRAVENOUS
  Filled 2020-10-16: qty 1

## 2020-10-16 MED ORDER — IOHEXOL 350 MG/ML SOLN
100.0000 mL | Freq: Once | INTRAVENOUS | Status: AC | PRN
  Administered 2020-10-16: 85 mL via INTRAVENOUS

## 2020-10-16 MED ORDER — ONDANSETRON HCL 4 MG/2ML IJ SOLN
4.0000 mg | Freq: Once | INTRAMUSCULAR | Status: AC
  Administered 2020-10-16: 4 mg via INTRAVENOUS
  Filled 2020-10-16: qty 2

## 2020-10-16 NOTE — ED Notes (Signed)
Care assumed. Report received form [place], Charity fundraiser

## 2020-10-16 NOTE — ED Notes (Signed)
IV team at bedside 

## 2020-10-16 NOTE — H&P (Signed)
History and Physical   Marie LevinsOceana Myers ZOX:[id number] DOB: [birth year] DOA: 10/16/2020  Referring MD/NP/PA: Dr. Dalene SeltzerSchlossman  [facility]   Outpatient Specialists: None  Patient coming from: Home  Chief Complaint: Intractable nausea and vomiting  HPI: Marie Myers is a 30 y.o. female with Myers history significant of gastric bypass surgery with, cholecystectomy, THC abuse questionable cyclical vomiting syndrome who presents to the ER with intractable nausea vomiting for the last few days.  Symptoms have persisted in the last 4 days she has been unable to keep anything down.  Is associated with mid abdominal pain.  Patient has received multiple medications in the ER including droperidol, fentanyl and Phenergan.  Symptoms have persisted.  Denied any constipation.  Denied any diarrhea.  Denied any bright red blood per rectum or melena.  Also denied any hematemesis.  Patient was seen in the ER yesterday with similar symptoms and treated.  Return today because of the persistence of symptoms.  Patient was woke [facility] to duration [facility].  She is otherwise has no other significant findings.  Urinalysis suggestive of UTI.  She denied any major dysuria.  No fever or chills.  Patient being admitted to the hospital with intractable nausea vomiting.  She does have SIRS with possible UTI but does not appear to be septic.[name].  ED Course: Temperature 98.5 blood pressure 182/139 initially with pulse 135 respirate of 24 and oxygen sats 97% on room air.  Sodium 133 potassium 3.7 chloride 90 CO2 21 glucose 115.  Total protein 8.3 total bilirubin 1.8.  White count 13.3 hemoglobin 15.4 and platelets of 313.  Urine drug screen is positive for THC.  Urinalysis showed many bacteria WBC 6-10 negative leukocytes or nitrites.  CT abdomen pelvis done yesterday was not impressive.  CT angiogram of the chest done today showed no significant PE.  Patient being admitted to the hospital with  intractable nausea vomiting.  Probably induced by cannabis.  Review of Systems: As per HPI otherwise 10 point review of systems negative.    History reviewed. No pertinent past Myers history.  Past Surgical History:  Procedure Laterality Date  . CHOLECYSTECTOMY    . GASTRIC BYPASS    . LAPAROSCOPY N/A 08/11/2019   Procedure: LAPAROSCOPY DIAGNOSTIC;  Surgeon: Harriette Bouillonornett, Thomas, MD;  Location: [facility] OR;  Service: General;  Laterality: N/A;     reports that she has never smoked. She has never used smokeless tobacco. She reports current alcohol use. She reports that she does not use drugs.  Allergies  Allergen Reactions  . Zithromax [Azithromycin] Hives    History reviewed. No pertinent family history.   Prior to Admission medications   Medication Sig Start Date End Date Taking? Authorizing Provider  acetaminophen (TYLENOL) 500 MG tablet Take 1,500 mg by mouth every 6 (six) hours as needed for moderate pain.   Yes [provider]  amitriptyline (ELAVIL) 100 MG tablet Take 100 mg by mouth at bedtime as needed for sleep. 10/14/20  Yes [provider]  dicyclomine (BENTYL) 20 MG tablet Take 20 mg by mouth in the morning and at bedtime.   Yes [provider]  diphenhydrAMINE (BENADRYL) 25 MG tablet Take 25 mg by mouth every 6 (six) hours as needed for itching or allergies.   Yes [provider]  omeprazole (PRILOSEC) 20 MG capsule Take 40 mg by mouth daily.   Yes [provider]  promethazine (PHENERGAN) 25 MG tablet Take 1 tablet (25 mg total)  by mouth every 6 (six) hours as needed for nausea or vomiting. 06/26/20  Yes Mare Ferrari, PA-C  traMADol (ULTRAM) 50 MG tablet Take 50 mg by mouth every 6 (six) hours as needed for moderate pain. 09/17/20  Yes [provider]    Physical Exam: Vitals:   10/16/20 2245 10/16/20 2300 10/16/20 2315 10/16/20 2330  BP: (!) 133/99 121/83 (!) 127/95 122/85  Pulse: 97 (!) 103 (!) 108 (!) 108  Resp: 14  19 19 16   Temp:      TempSrc:      SpO2: 100% 100% 99% 97%  Weight:      Height:          Constitutional: Acutely ill looking, mild distress, in fetal position Vitals:   10/16/20 2245 10/16/20 2300 10/16/20 2315 10/16/20 2330  BP: (!) 133/99 121/83 (!) 127/95 122/85  Pulse: 97 (!) 103 (!) 108 (!) 108  Resp: 14 19 19 16   Temp:      TempSrc:      SpO2: 100% 100% 99% 97%  Weight:      Height:       Eyes: PERRL, lids and conjunctivae normal ENMT: Mucous membranes are dry. Posterior pharynx clear of any exudate or lesions.Normal dentition.  Neck: normal, supple, no masses, no thyromegaly Respiratory: clear to auscultation bilaterally, no wheezing, no crackles. Normal respiratory effort. No accessory muscle use.  Cardiovascular: Sinus tachycardia, no murmurs / rubs / gallops. No extremity edema. 2+ pedal pulses. No carotid bruits.  Abdomen: no tenderness, no masses palpated. No hepatosplenomegaly. Bowel sounds positive.  Musculoskeletal: no clubbing / cyanosis. No joint deformity upper and lower extremities. Good ROM, no contractures. Normal muscle tone.  Skin: no rashes, lesions, ulcers. No induration Neurologic: CN 2-12 grossly intact. Sensation intact, DTR normal. Strength 5/5 in all 4.  Psychiatric: Normal judgment and insight. Alert and oriented x 3.  Depressed mood.     Labs on Admission: I have personally reviewed following labs and imaging studies  CBC: Recent Labs  Lab 10/15/20 1822 10/16/20 1750  WBC 12.3* 13.3*  NEUTROABS  --  10.3*  HGB 15.4* 15.4*  HCT 45.9 43.9  MCV 106.0* 103.3*  PLT 317 313   Basic Metabolic Panel: Recent Labs  Lab 10/15/20 1822 10/16/20 1750  NA 141 133*  K 3.9 3.7  CL 104 98  CO2 23 21*  GLUCOSE 94 115*  BUN 10 9  CREATININE 0.59 0.65  CALCIUM 9.9 9.2   GFR: Estimated Creatinine Clearance: 105.8 mL/min (by C-G formula based on SCr of 0.65 mg/dL). Liver Function Tests: Recent Labs  Lab 10/15/20 1822 10/16/20 1750  AST  21 22  ALT 15 13  ALKPHOS 70 66  BILITOT 0.9 1.8*  PROT 8.9* 8.3*  ALBUMIN 5.2* 4.8   Recent Labs  Lab 10/15/20 1822 10/16/20 1750  LIPASE 27 25   No results for input(s): AMMONIA in the last 168 hours. Coagulation Profile: No results for input(s): INR, PROTIME in the last 168 hours. Cardiac Enzymes: No results for input(s): CKTOTAL, CKMB, CKMBINDEX, TROPONINI in the last 168 hours. BNP (last 3 results) No results for input(s): PROBNP in the last 8760 hours. HbA1C: No results for input(s): HGBA1C in the last 72 hours. CBG: No results for input(s): GLUCAP in the last 168 hours. Lipid Profile: No results for input(s): CHOL, HDL, LDLCALC, TRIG, CHOLHDL, LDLDIRECT in the last 72 hours. Thyroid Function Tests: Recent Labs    10/16/20 1750  TSH 1.454   Anemia Panel:  No results for input(s): VITAMINB12, FOLATE, FERRITIN, TIBC, IRON, RETICCTPCT in the last 72 hours. Urine analysis:    Component Value Date/Time   COLORURINE AMBER (A) 10/16/2020 1803   APPEARANCEUR CLOUDY (A) 10/16/2020 1803   LABSPEC 1.033 (H) 10/16/2020 1803   PHURINE 5.0 10/16/2020 1803   GLUCOSEU NEGATIVE 10/16/2020 1803   HGBUR SMALL (A) 10/16/2020 1803   BILIRUBINUR NEGATIVE 10/16/2020 1803   KETONESUR 80 (A) 10/16/2020 1803   PROTEINUR 100 (A) 10/16/2020 1803   NITRITE NEGATIVE 10/16/2020 1803   LEUKOCYTESUR NEGATIVE 10/16/2020 1803   Sepsis Labs: @LABRCNTIP (procalcitonin:4,lacticidven:4) )No results found for this or any previous visit (from the past 240 hour(s)).   Radiological Exams on Admission: DG Chest 2 View  Result Date: 10/15/2020 CLINICAL DATA:  Chest pain. EXAM: CHEST - 2 VIEW COMPARISON:  June 26, 2020. FINDINGS: The heart size and mediastinal contours are within normal limits. Both lungs are clear. No pneumothorax or pleural effusion is noted. The visualized skeletal structures are unremarkable. IMPRESSION: No active cardiopulmonary disease. Electronically Signed   By: June 28, 2020 M.D.   On: 10/15/2020 16:45   CT Angio Chest PE W and/or Wo Contrast  Result Date: 10/16/2020 CLINICAL DATA:  Abdominal pain and nausea and vomiting for 1 day, history of chest pain EXAM: CT ANGIOGRAPHY CHEST WITH CONTRAST TECHNIQUE: Multidetector CT imaging of the chest was performed using the standard protocol during bolus administration of intravenous contrast. Multiplanar CT image reconstructions and MIPs were obtained to evaluate the vascular anatomy. CONTRAST:  73mL OMNIPAQUE IOHEXOL 350 MG/ML SOLN COMPARISON:  10/15/2020 FINDINGS: Cardiovascular: This is a technically adequate evaluation of the pulmonary vasculature. No filling defects or pulmonary emboli. The heart is unremarkable without pericardial effusion. No evidence of thoracic aortic aneurysm or dissection. Mediastinum/Nodes: No enlarged mediastinal, hilar, or axillary lymph nodes. Thyroid gland, trachea, and esophagus demonstrate no significant findings. Lungs/Pleura: No airspace disease, effusion, or pneumothorax. Central airways are patent. Upper Abdomen: Postsurgical changes from bariatric surgery. No acute process. Musculoskeletal: No acute or destructive bony lesions. Reconstructed images demonstrate no additional findings. Review of the MIP images confirms the above findings. IMPRESSION: 1. No evidence of pulmonary embolus. 2. No acute intrathoracic process. Electronically Signed   By: 10/17/2020 M.D.   On: 10/16/2020 21:50   CT ABDOMEN PELVIS W CONTRAST  Result Date: 10/15/2020 CLINICAL DATA:  Abdominal pain. EXAM: CT ABDOMEN AND PELVIS WITH CONTRAST TECHNIQUE: Multidetector CT imaging of the abdomen and pelvis was performed using the standard protocol following bolus administration of intravenous contrast. CONTRAST:  10/17/2020 OMNIPAQUE IOHEXOL 300 MG/ML  SOLN COMPARISON:  August 19, 2020 FINDINGS: Lower chest: No acute abnormality. Hepatobiliary: No focal liver abnormality is seen. Status post cholecystectomy. No biliary  dilatation. Pancreas: Unremarkable. No pancreatic ductal dilatation or surrounding inflammatory changes. Spleen: Normal in size without focal abnormality. Adrenals/Urinary Tract: Adrenal glands are unremarkable. Kidneys are normal, without renal calculi, focal lesion, or hydronephrosis. Bladder is unremarkable. Stomach/Bowel: There is a small hiatal hernia. Multiple surgical sutures are seen within the gastric region. Appendix appears normal. No evidence of bowel wall thickening, distention, or inflammatory changes. Vascular/Lymphatic: No significant vascular findings are present. No enlarged abdominal or pelvic lymph nodes. Reproductive: Uterus and bilateral adnexa are unremarkable. Other: No abdominal wall hernia or abnormality. No abdominopelvic ascites. Musculoskeletal: No acute or significant osseous findings. IMPRESSION: 1. Evidence of prior gastric bypass surgery. 2. Small hiatal hernia. 3. Evidence of prior cholecystectomy. Electronically Signed   By: August 21, 2020.D.  On: 10/15/2020 23:24    EKG: Independently reviewed.  Sinus tachycardia otherwise no significant findings  Assessment/Plan Principal Problem:   Intractable nausea and vomiting Active Problems:   Abdominal pain   Tetrahydrocannabinol (THC) dependence (HCC)   Acute lower UTI   Tachypnea   Dehydration   Hypertensive urgency     #1 intractable nausea with vomiting: Patient will be admitted for symptomatic control.  IV fluids.  IV Phenergan as needed.  Other supportive care.  Patient symptoms are probably secondary to the use of cannabis.  Counseled therefore will be provided in the regard.  UTI possibility but not impressive.  We will continue close monitoring.  #2 abdominal pain: Probably related to has intractable nausea vomiting.  Symptomatic treatment.  Avoid narcotics.  #3 THC abuse: Counseling provided.  #4 questionable UTI: Empiric antibiotics.  Follow urine cultures  #5 hypertensive urgency: Transient.   Blood pressure now better controlled.  Continue to monitor     DVT prophylaxis: SCD Code Status: Full code Family Communication: No family at bedside Disposition Plan: Home Consults called: None Admission status: Observation  Severity of Illness: The appropriate patient status for this patient is OBSERVATION. Observation status is judged to be reasonable and necessary in order to provide the required intensity of service to ensure the patient's safety. The patient's presenting symptoms, physical exam findings, and initial radiographic and laboratory data in the context of their Myers condition is felt to place them at decreased risk for further clinical deterioration. Furthermore, it is anticipated that the patient will be medically stable for discharge from the hospital within 2 midnights of admission. The following factors support the patient status of observation.   " The patient's presenting symptoms include intractable nausea vomiting. " The physical exam findings include dry mucous membranes. " The initial radiographic and laboratory data are possible UTI.     Lonia Blood MD [facility] Pager [phone]  If 7PM-7AM, please contact night-coverage [email or web address] Password [facility]  10/16/2020, 11:47 PM

## 2020-10-16 NOTE — Discharge Instructions (Signed)
Please follow-up with your primary doctor and your gastroenterologist. Continue your previously prescribed medicines. Return if your vomiting is uncontrolled, he develop worsening pain, fever or other new concerns symptom.

## 2020-10-16 NOTE — ED Notes (Signed)
Pt did not tolerate PO challenge. Drank juice and started vomiting within 15 minutes and having severe abdominal cramping. PA notified

## 2020-10-16 NOTE — ED Triage Notes (Signed)
Pt reports abdominal pain and N/V since Sunday. Pt was seen here yesterday for same and was told to come back if the pain became worse.

## 2020-10-16 NOTE — ED Provider Notes (Signed)
[facility]-EMERGENCY DEPT Provider Note   CSN: [id number] Arrival date & time: 10/16/20  1541     History Chief Complaint  Patient presents with  . Abdominal Pain    Marie Myers is a 30 y.o. female.  The history is provided by the patient and medical records. No language interpreter was used.  Abdominal Pain    30 year old female significant history of gastric bypass, cholecystectomy, laparoscopy recurrent abdominal pain, presents ED for evaluation of abdominal pain.  Patient mention for the past 4 days she has had recurrent pain to her upper abdomen.  She described pain as a ball-like sensation to her epigastric region, nonradiating, with associated heart palpitation, feeling nauseous, and has had nonbloody nonbilious vomits.  Pain is 10 out of 10, persistent, and she feels very uncomfortable.  Report normal bowel movement.  No associated fever chills no chest pain or shortness of breath no runny nose sneezing coughing loss of taste or smell.  Patient was seen in the ED yesterday for the same complaint.  A CT scan of the chest and abdomen was obtained without any acute abnormalities.  She did receive symptomatic treatment which include droperidol, fentanyl, IV fluid and her symptoms did improved.  However shortly after discharge, her symptoms returned and have been persistent.  She report having epigastric bypass in [place] and due to recently moved down here for the past year she does not have any close follow-up.  She is a social drinker and occasional marijuana user but states she has not used it for more than a week.  She denies any recent sick contact.  No history of thyroid disease.  No prior history of PE or DVT.  History reviewed. No pertinent past medical history.  Patient Active Problem List   Diagnosis Date Noted  . Abdominal pain 08/11/2019    Past Surgical History:  Procedure Laterality Date  . CHOLECYSTECTOMY    . GASTRIC BYPASS    . LAPAROSCOPY  N/A 08/11/2019   Procedure: LAPAROSCOPY DIAGNOSTIC;  Surgeon: Harriette Bouillon, MD;  Location: [facility] OR;  Service: General;  Laterality: N/A;     OB History   No obstetric history on file.     History reviewed. No pertinent family history.  Social History   Tobacco Use  . Smoking status: Never Smoker  . Smokeless tobacco: Never Used  Substance Use Topics  . Alcohol use: Yes    Comment: occasionally  . Drug use: Never    Home Medications Prior to Admission medications   Medication Sig Start Date End Date Taking? Authorizing Provider  amitriptyline (ELAVIL) 100 MG tablet Take 100 mg by mouth at bedtime as needed for sleep. 10/14/20   [provider]  omeprazole (PRILOSEC) 20 MG capsule Take 40 mg by mouth daily.    [provider]  promethazine (PHENERGAN) 25 MG tablet Take 1 tablet (25 mg total) by mouth every 6 (six) hours as needed for nausea or vomiting. 06/26/20   Mare Ferrari, PA-C  traMADol (ULTRAM) 50 MG tablet Take 50 mg by mouth every 6 (six) hours as needed for moderate pain. 09/17/20   [provider]    Allergies    Zithromax [azithromycin]  Review of Systems   Review of Systems  Gastrointestinal: Positive for abdominal pain.  All other systems reviewed and are negative.   Physical Exam Updated Vital Signs BP (!) 139/104 (BP Location: Right Arm)   Pulse (!) 135   Temp 98.5 F (36.9 C) (Oral)  Resp 16   LMP 10/06/2020   SpO2 100%   Physical Exam Vitals and nursing note reviewed.  Constitutional:      General: She is not in acute distress.    Appearance: She is well-developed and well-nourished.  HENT:     Head: Atraumatic.  Eyes:     Conjunctiva/sclera: Conjunctivae normal.  Neck:     Comments: No thyromegaly Cardiovascular:     Rate and Rhythm: Tachycardia present.     Heart sounds: Normal heart sounds. No murmur heard. No friction rub. No gallop.   Pulmonary:     Effort: Pulmonary effort is normal.     Breath  sounds: Normal breath sounds. No wheezing or rhonchi.  Abdominal:     General: Abdomen is flat. Bowel sounds are normal.     Palpations: Abdomen is soft.     Tenderness: There is abdominal tenderness in the epigastric area. There is no right CVA tenderness, left CVA tenderness, guarding or rebound.     Hernia: No hernia is present.  Musculoskeletal:     Cervical back: Neck supple.  Skin:    Findings: No rash.  Neurological:     Mental Status: She is alert. Mental status is at baseline.  Psychiatric:        Mood and Affect: Mood and affect and mood normal.     ED Results / Procedures / Treatments   Labs (all labs ordered are listed, but only abnormal results are displayed) Labs Reviewed  CBC WITH DIFFERENTIAL/PLATELET - Abnormal; Notable for the following components:      Result Value   WBC 13.3 (*)    Hemoglobin 15.4 (*)    MCV 103.3 (*)    MCH 36.2 (*)    Neutro Abs 10.3 (*)    Monocytes Absolute 1.2 (*)    All other components within normal limits  COMPREHENSIVE METABOLIC PANEL - Abnormal; Notable for the following components:   Sodium 133 (*)    CO2 21 (*)    Glucose, Bld 115 (*)    Total Protein 8.3 (*)    Total Bilirubin 1.8 (*)    All other components within normal limits  URINALYSIS, ROUTINE W REFLEX MICROSCOPIC - Abnormal; Notable for the following components:   Color, Urine AMBER (*)    APPearance CLOUDY (*)    Specific Gravity, Urine 1.033 (*)    Hgb urine dipstick SMALL (*)    Ketones, ur 80 (*)    Protein, ur 100 (*)    Bacteria, UA MANY (*)    All other components within normal limits  RAPID URINE DRUG SCREEN, HOSP PERFORMED - Abnormal; Notable for the following components:   Tetrahydrocannabinol POSITIVE (*)    All other components within normal limits  LIPASE, BLOOD  TSH    EKG None  Radiology DG Chest 2 View  Result Date: 10/15/2020 CLINICAL DATA:  Chest pain. EXAM: CHEST - 2 VIEW COMPARISON:  June 26, 2020. FINDINGS: The heart size and  mediastinal contours are within normal limits. Both lungs are clear. No pneumothorax or pleural effusion is noted. The visualized skeletal structures are unremarkable. IMPRESSION: No active cardiopulmonary disease. Electronically Signed   By: Lupita RaiderJames  Green Jr M.D.   On: 10/15/2020 16:45   CT Angio Chest PE W and/or Wo Contrast  Result Date: 10/16/2020 CLINICAL DATA:  Abdominal pain and nausea and vomiting for 1 day, history of chest pain EXAM: CT ANGIOGRAPHY CHEST WITH CONTRAST TECHNIQUE: Multidetector CT imaging of the chest was performed using  the standard protocol during bolus administration of intravenous contrast. Multiplanar CT image reconstructions and MIPs were obtained to evaluate the vascular anatomy. CONTRAST:  83mL OMNIPAQUE IOHEXOL 350 MG/ML SOLN COMPARISON:  10/15/2020 FINDINGS: Cardiovascular: This is a technically adequate evaluation of the pulmonary vasculature. No filling defects or pulmonary emboli. The heart is unremarkable without pericardial effusion. No evidence of thoracic aortic aneurysm or dissection. Mediastinum/Nodes: No enlarged mediastinal, hilar, or axillary lymph nodes. Thyroid gland, trachea, and esophagus demonstrate no significant findings. Lungs/Pleura: No airspace disease, effusion, or pneumothorax. Central airways are patent. Upper Abdomen: Postsurgical changes from bariatric surgery. No acute process. Musculoskeletal: No acute or destructive bony lesions. Reconstructed images demonstrate no additional findings. Review of the MIP images confirms the above findings. IMPRESSION: 1. No evidence of pulmonary embolus. 2. No acute intrathoracic process. Electronically Signed   By: Sharlet Salina M.D.   On: 10/16/2020 21:50   CT ABDOMEN PELVIS W CONTRAST  Result Date: 10/15/2020 CLINICAL DATA:  Abdominal pain. EXAM: CT ABDOMEN AND PELVIS WITH CONTRAST TECHNIQUE: Multidetector CT imaging of the abdomen and pelvis was performed using the standard protocol following bolus  administration of intravenous contrast. CONTRAST:  OMNIPAQUE IOHEXOL 300 MG/ML  SOLN COMPARISON:  August 19, 2020 FINDINGS: Lower chest: No acute abnormality. Hepatobiliary: No focal liver abnormality is seen. Status post cholecystectomy. No biliary dilatation. Pancreas: Unremarkable. No pancreatic ductal dilatation or surrounding inflammatory changes. Spleen: Normal in size without focal abnormality. Adrenals/Urinary Tract: Adrenal glands are unremarkable. Kidneys are normal, without renal calculi, focal lesion, or hydronephrosis. Bladder is unremarkable. Stomach/Bowel: There is a small hiatal hernia. Multiple surgical sutures are seen within the gastric region. Appendix appears normal. No evidence of bowel wall thickening, distention, or inflammatory changes. Vascular/Lymphatic: No significant vascular findings are present. No enlarged abdominal or pelvic lymph nodes. Reproductive: Uterus and bilateral adnexa are unremarkable. Other: No abdominal wall hernia or abnormality. No abdominopelvic ascites. Musculoskeletal: No acute or significant osseous findings. IMPRESSION: 1. Evidence of prior gastric bypass surgery. 2. Small hiatal hernia. 3. Evidence of prior cholecystectomy. Electronically Signed   By: Aram Candela M.D.   On: 10/15/2020 23:24    Procedures Procedures   Medications Ordered in ED Medications  sodium chloride 0.9 % bolus 1,000 mL (0 mLs Intravenous Stopped 10/16/20 1903)  morphine 4 MG/ML injection 4 mg (4 mg Intravenous Given 10/16/20 1755)  ondansetron (ZOFRAN) injection 4 mg (4 mg Intravenous Given 10/16/20 1754)  haloperidol lactate (HALDOL) injection 5 mg (5 mg Intravenous Given 10/16/20 1949)  iohexol (OMNIPAQUE) 350 MG/ML injection 100 mL (85 mLs Intravenous Contrast Given 10/16/20 2027)  ondansetron (ZOFRAN) injection 4 mg (4 mg Intravenous Given 10/16/20 2247)    ED Course  I have reviewed the triage vital signs and the nursing notes.  Pertinent labs & imaging  results that were available during my care of the patient were reviewed by me and considered in my medical decision making (see chart for details).    MDM Rules/Calculators/A&P                          BP 122/85   Pulse (!) 108   Temp 98.1 F (36.7 C) (Oral)   Resp 16   Ht 5\' 4"  (1.626 m)   Wt 79.4 kg   LMP 10/06/2020   SpO2 97%   BMI 30.04 kg/m   Final Clinical Impression(s) / ED Diagnoses Final diagnoses:  Intractable nausea and vomiting  Epigastric pain  Dehydration  Rx / DC Orders ED Discharge Orders    None     5:17 PM Patient here with recurrent upper abdominal pain with associated nausea and vomiting.  Was seen in the ED yesterday for same and had a thorough work-up including abdominal pelvis CT scan without any acute finding.  Symptom has returned and she report feeling comfortable.  Patient was found to be tachycardic with heart rate of 135.  Had an EKG yesterday that shows sinus tachycardia with PVC.  At this time will reassess patient, recheck labs, give IV fluid, provide symptomatic treatment, as well as checking thyroid function.  No complaints of shortness of breath or pleuritic chest pain to suggest PE.  Patient symptoms may be due to cannabinol hyperemesis syndrome however patient states she has not used marijuana for more than a week  7:10 PM Patient still endorse of abdominal pain, also endorsed some pleuritic component with it.  She remains tachycardic with heart rates in the 120s despite receiving 1 L of IV fluid.  UDS is positive for THC.  White count is 13.3 near similar value.  Normal TSH, normal lipase.  In the setting of persistent tachycardia and some pleuritic component, will obtain a chest CT angiogram to rule out PE.  Patient agrees with plan.  10:44 PM Chest CT a without any acute finding.  However on reassessment, patient still symptomatic, unable to tolerate p.o. and endorsed persistent nausea.  She does not feel comfortable going home stating  that her nausea would likely cause her to return.  She does have Phenergan suppository that she used at home without relief.  Given elevated ketones of 80, tachycardia, signs of dehydration and patient inability tolerate p.o. despite receiving multiple dose of antiemetic, will consult admission for intractable nausea and vomiting.  11:43 PM Appreciate consultation from [facility] Dr. Mikeal Hawthorne who agrees to see and admit pt for further care of her intractable nausea and vomiting.    Fayrene Helper, PA-C 10/16/20 2345    Alvira Monday, MD 10/17/20 [phone]

## 2020-10-17 DIAGNOSIS — I160 Hypertensive urgency: Secondary | ICD-10-CM

## 2020-10-17 DIAGNOSIS — E860 Dehydration: Secondary | ICD-10-CM

## 2020-10-17 DIAGNOSIS — R1084 Generalized abdominal pain: Secondary | ICD-10-CM

## 2020-10-17 LAB — CBC
HCT: 39.2 % (ref 36.0–46.0)
Hemoglobin: 13.2 g/dL (ref 12.0–15.0)
MCH: 36.2 pg — ABNORMAL HIGH (ref 26.0–34.0)
MCHC: 33.7 g/dL (ref 30.0–36.0)
MCV: 107.4 fL — ABNORMAL HIGH (ref 80.0–100.0)
Platelets: 250 10*3/uL (ref 150–400)
RBC: 3.65 MIL/uL — ABNORMAL LOW (ref 3.87–5.11)
RDW: 11.8 % (ref 11.5–15.5)
WBC: 10 10*3/uL (ref 4.0–10.5)
nRBC: 0 % (ref 0.0–0.2)

## 2020-10-17 LAB — COMPREHENSIVE METABOLIC PANEL
ALT: 11 U/L (ref 0–44)
AST: 14 U/L — ABNORMAL LOW (ref 15–41)
Albumin: 3.8 g/dL (ref 3.5–5.0)
Alkaline Phosphatase: 52 U/L (ref 38–126)
Anion gap: 10 (ref 5–15)
BUN: 11 mg/dL (ref 6–20)
CO2: 22 mmol/L (ref 22–32)
Calcium: 8.5 mg/dL — ABNORMAL LOW (ref 8.9–10.3)
Chloride: 105 mmol/L (ref 98–111)
Creatinine, Ser: 0.64 mg/dL (ref 0.44–1.00)
GFR, Estimated: >60 mL/min (ref >60)
Glucose, Bld: 102 mg/dL — ABNORMAL HIGH (ref 70–99)
Potassium: 3.2 mmol/L — ABNORMAL LOW (ref 3.5–5.1)
Sodium: 137 mmol/L (ref 135–145)
Total Bilirubin: 1.4 mg/dL — ABNORMAL HIGH (ref 0.3–1.2)
Total Protein: 6.5 g/dL (ref 6.5–8.1)

## 2020-10-17 LAB — SARS CORONAVIRUS 2 (TAT 6-24 HRS): SARS Coronavirus 2: NEGATIVE

## 2020-10-17 LAB — HIV ANTIBODY (ROUTINE TESTING W REFLEX): HIV Screen 4th Generation wRfx: NONREACTIVE

## 2020-10-17 MED ORDER — SODIUM CHLORIDE 0.9 % IV SOLN
1.0000 g | INTRAVENOUS | Status: DC
  Administered 2020-10-17 – 2020-10-18 (×2): 1 g via INTRAVENOUS
  Filled 2020-10-17 (×2): qty 1

## 2020-10-17 MED ORDER — PROMETHAZINE HCL 25 MG PO TABS
12.5000 mg | ORAL_TABLET | Freq: Four times a day (QID) | ORAL | Status: DC | PRN
  Administered 2020-10-17 (×2): 12.5 mg via ORAL
  Filled 2020-10-17 (×2): qty 1

## 2020-10-17 MED ORDER — METOCLOPRAMIDE HCL 5 MG/ML IJ SOLN
5.0000 mg | Freq: Three times a day (TID) | INTRAMUSCULAR | Status: DC
  Administered 2020-10-17 – 2020-10-18 (×4): 5 mg via INTRAVENOUS
  Filled 2020-10-17 (×4): qty 2

## 2020-10-17 MED ORDER — METOPROLOL TARTRATE 5 MG/5ML IV SOLN: 5.0000 mg | Freq: Four times a day (QID) | INTRAVENOUS | Status: DC | PRN

## 2020-10-17 MED ORDER — DEXTROSE-NACL 5-0.9 % IV SOLN: INTRAVENOUS | Status: DC

## 2020-10-17 MED ORDER — POTASSIUM CHLORIDE 10 MEQ/100ML IV SOLN
10.0000 meq | INTRAVENOUS | Status: AC
  Administered 2020-10-17 (×2): 10 meq via INTRAVENOUS
  Filled 2020-10-17 (×2): qty 100

## 2020-10-17 MED ORDER — KETOROLAC TROMETHAMINE 15 MG/ML IJ SOLN
15.0000 mg | Freq: Four times a day (QID) | INTRAMUSCULAR | Status: DC | PRN
  Administered 2020-10-17 – 2020-10-18 (×5): 15 mg via INTRAVENOUS
  Filled 2020-10-17 (×5): qty 1

## 2020-10-17 MED ORDER — PANTOPRAZOLE SODIUM 40 MG IV SOLR
40.0000 mg | Freq: Every day | INTRAVENOUS | Status: DC
  Administered 2020-10-17 – 2020-10-18 (×2): 40 mg via INTRAVENOUS
  Filled 2020-10-17 (×2): qty 40

## 2020-10-17 NOTE — Progress Notes (Signed)
PROGRESS NOTE    Marie Myers  XLK:[id number] DOB: 1991/02/09 DOA: 10/16/2020 PCP: [facility]    Chief Complaint  Patient presents with  . Abdominal Pain    Brief Narrative:  30 year old lady prior history of gastric bypass surgery with THC abuse, questionable cyclical vomiting syndrome, cholecystectomy, presents to ED with persistent nausea vomiting for the last 5 days. On arrival to ED she underwent urine analysis which showed many bacteria. Her symptoms persistent despite IV Phenergan droperidol and fentanyl.she was admitted for intractable nausea, and vomiting.   Assessment & Plan:   Principal Problem:   Intractable nausea and vomiting Active Problems:   Abdominal pain   Tetrahydrocannabinol (THC) dependence (HCC)   Acute lower UTI   Tachypnea   Dehydration   Hypertensive urgency   Intractable nausea, with vomiting: Symptomatic management with IV fluids, IV anti emetics, IV protonix.  Probably sec to [facility] use.    Abnormal UA>  Urine cultures ordered.  On IV rocephin.    THC abuse:  Counseling provided    Hypertensive urgency Resolved.    DVT prophylaxis: (lovenox.  Code Status: full code.  Family Communication: none at bedside.  Disposition:   Status is: Inpatient  Remains inpatient appropriate because:IV treatments appropriate due to intensity of illness or inability to take PO   Dispo: The patient is from: Home              Anticipated d/c is to: Home              Patient currently is not medically stable to d/c.   Difficult to place patient No    Level of care: Telemetry    Consultants:   None.   Procedures: none.   Antimicrobials:rocephin since admission.    Subjective: persistent nausea, abd pain severe.   Objective: Vitals:   10/17/20 0119 10/17/20 0556 10/17/20 0904 10/17/20 1200  BP:  117/80 125/83 126/86  Pulse:  98 87 95  Resp:  16 20 18   Temp:  98.4 F (36.9 C) 98.3 F (36.8 C) 98.9 F (37.2 C)   TempSrc:  Oral Oral Oral  SpO2:  99% 100% 100%  Weight: 80.4 kg     Height: 5\' 4"  (1.626 m)       Intake/Output Summary (Last 24 hours) at 10/17/2020 1407 Last data filed at 10/17/2020 1145 Gross per 24 hour  Intake 1300 ml  Output --  Net 1300 ml   Filed Weights   10/16/20 1713 10/17/20 0119  Weight: 79.4 kg 80.4 kg    Examination:  General exam: Appears calm and comfortable  Respiratory system: Clear to auscultation. Respiratory effort normal. Cardiovascular system: S1 & S2 heard, RRR. No JVD, . No pedal edema. Gastrointestinal system: Abdomen is soft, tender, non distended. Normal bowel sounds heard. Central nervous system: Alert and oriented. No focal neurological deficits. Extremities: Symmetric 5 x 5 power. Skin: No rashes, lesions or ulcers Psychiatry:  Mood & affect appropriate.     Data Reviewed: I have personally reviewed following labs and imaging studies  CBC: Recent Labs  Lab 10/15/20 1822 10/16/20 1750 10/17/20 0422  WBC 12.3* 13.3* 10.0  NEUTROABS  --  10.3*  --   HGB 15.4* 15.4* 13.2  HCT 45.9 43.9 39.2  MCV 106.0* 103.3* 107.4*  PLT 317 313 250    Basic Metabolic Panel: Recent Labs  Lab 10/15/20 1822 10/16/20 1750 10/17/20 0422  NA 141 133* 137  K 3.9 3.7 3.2*  CL 104 98 105  CO2 23 21* 22  GLUCOSE 94 115* 102*  BUN 10 9 11   CREATININE 0.59 0.65 0.64  CALCIUM 9.9 9.2 8.5*    GFR: Estimated Creatinine Clearance: 106.5 mL/min (by C-G formula based on SCr of 0.64 mg/dL).  Liver Function Tests: Recent Labs  Lab 10/15/20 1822 10/16/20 1750 10/17/20 0422  AST 21 22 14*  ALT 15 13 11   ALKPHOS 70 66 52  BILITOT 0.9 1.8* 1.4*  PROT 8.9* 8.3* 6.5  ALBUMIN 5.2* 4.8 3.8    CBG: No results for input(s): GLUCAP in the last 168 hours.   Recent Results (from the past 240 hour(s))  SARS CORONAVIRUS 2 (TAT 6-24 HRS) Nasopharyngeal Nasopharyngeal Swab     Status: None   Collection Time: 10/16/20 11:44 PM   Specimen: Nasopharyngeal  Swab  Result Value Ref Range Status   SARS Coronavirus 2 NEGATIVE NEGATIVE Final    Comment: (NOTE) SARS-CoV-2 target nucleic acids are NOT DETECTED.  The SARS-CoV-2 RNA is generally detectable in upper and lower respiratory specimens during the acute phase of infection. Negative results do not preclude SARS-CoV-2 infection, do not rule out co-infections with other pathogens, and should not be used as the sole basis for treatment or other patient management decisions. Negative results must be combined with clinical observations, patient history, and epidemiological information. The expected result is Negative.  Fact Sheet for Patients:  Fact Sheet for Healthcare Providers: 10/18/20  This test is not yet approved or cleared by the [employer] FDA and  has been authorized for detection and/or diagnosis of SARS-CoV-2 by FDA under an Emergency Use Authorization (EUA). This EUA will remain  in effect (meaning this test can be used) for the duration of the COVID-19 declaration under Se ction 564(b)(1) of the Act, 21 U.S.C. section 360bbb-3(b)(1), unless the authorization is terminated or revoked sooner.  Performed at [facility], [address], [place], [address] [place]          Radiology Studies: DG Chest 2 View  Result Date: 10/15/2020 CLINICAL DATA:  Chest pain. EXAM: CHEST - 2 VIEW COMPARISON:  June 26, 2020. FINDINGS: The heart size and mediastinal contours are within normal limits. Both lungs are clear. No pneumothorax or pleural effusion is noted. The visualized skeletal structures are unremarkable. IMPRESSION: No active cardiopulmonary disease. Electronically Signed   By: 10/17/2020 M.D.   On: 10/15/2020 16:45   CT Angio Chest PE W and/or Wo Contrast  Result Date: 10/16/2020 CLINICAL DATA:  Abdominal pain and nausea and vomiting for 1 day, history of chest pain EXAM: CT  ANGIOGRAPHY CHEST WITH CONTRAST TECHNIQUE: Multidetector CT imaging of the chest was performed using the standard protocol during bolus administration of intravenous contrast. Multiplanar CT image reconstructions and MIPs were obtained to evaluate the vascular anatomy. CONTRAST:  40mL OMNIPAQUE IOHEXOL 350 MG/ML SOLN COMPARISON:  10/15/2020 FINDINGS: Cardiovascular: This is a technically adequate evaluation of the pulmonary vasculature. No filling defects or pulmonary emboli. The heart is unremarkable without pericardial effusion. No evidence of thoracic aortic aneurysm or dissection. Mediastinum/Nodes: No enlarged mediastinal, hilar, or axillary lymph nodes. Thyroid gland, trachea, and esophagus demonstrate no significant findings. Lungs/Pleura: No airspace disease, effusion, or pneumothorax. Central airways are patent. Upper Abdomen: Postsurgical changes from bariatric surgery. No acute process. Musculoskeletal: No acute or destructive bony lesions. Reconstructed images demonstrate no additional findings. Review of the MIP images confirms the above findings. IMPRESSION: 1. No evidence of pulmonary embolus. 2. No acute intrathoracic process. Electronically  Signed   By: Sharlet Salina M.D.   On: 10/16/2020 21:50   CT ABDOMEN PELVIS W CONTRAST  Result Date: 10/15/2020 CLINICAL DATA:  Abdominal pain. EXAM: CT ABDOMEN AND PELVIS WITH CONTRAST TECHNIQUE: Multidetector CT imaging of the abdomen and pelvis was performed using the standard protocol following bolus administration of intravenous contrast. CONTRAST:  OMNIPAQUE IOHEXOL 300 MG/ML  SOLN COMPARISON:  August 19, 2020 FINDINGS: Lower chest: No acute abnormality. Hepatobiliary: No focal liver abnormality is seen. Status post cholecystectomy. No biliary dilatation. Pancreas: Unremarkable. No pancreatic ductal dilatation or surrounding inflammatory changes. Spleen: Normal in size without focal abnormality. Adrenals/Urinary Tract: Adrenal glands are  unremarkable. Kidneys are normal, without renal calculi, focal lesion, or hydronephrosis. Bladder is unremarkable. Stomach/Bowel: There is a small hiatal hernia. Multiple surgical sutures are seen within the gastric region. Appendix appears normal. No evidence of bowel wall thickening, distention, or inflammatory changes. Vascular/Lymphatic: No significant vascular findings are present. No enlarged abdominal or pelvic lymph nodes. Reproductive: Uterus and bilateral adnexa are unremarkable. Other: No abdominal wall hernia or abnormality. No abdominopelvic ascites. Musculoskeletal: No acute or significant osseous findings. IMPRESSION: 1. Evidence of prior gastric bypass surgery. 2. Small hiatal hernia. 3. Evidence of prior cholecystectomy. Electronically Signed   By: Aram Candela M.D.   On: 10/15/2020 23:24        Scheduled Meds: . metoCLOPramide (REGLAN) injection  5 mg Intravenous Q8H  . pantoprazole (PROTONIX) IV  40 mg Intravenous Q24H   Continuous Infusions: . cefTRIAXone (ROCEPHIN)  IV 1 g (10/17/20 0148)  . dextrose 5 % and 0.9% NaCl 125 mL/hr at 10/17/20 0840  . potassium chloride 10 mEq (10/17/20 1309)     LOS: 1 day        Kathlen Mody, MD [facility]   To contact the attending provider between 7A-7P or the covering provider during after hours 7P-7A, please log into the web site [email or web address] and access using universal [place] password for that web site. If you do not have the password, please call the hospital operator.  10/17/2020, 2:07 PM

## 2020-10-17 NOTE — Plan of Care (Signed)

## 2020-10-18 LAB — BASIC METABOLIC PANEL
Anion gap: 9 (ref 5–15)
BUN: 9 mg/dL (ref 6–20)
CO2: 22 mmol/L (ref 22–32)
Calcium: 8.5 mg/dL — ABNORMAL LOW (ref 8.9–10.3)
Chloride: 108 mmol/L (ref 98–111)
Creatinine, Ser: 0.54 mg/dL (ref 0.44–1.00)
GFR, Estimated: >60 mL/min (ref >60)
Glucose, Bld: 86 mg/dL (ref 70–99)
Potassium: 3.7 mmol/L (ref 3.5–5.1)
Sodium: 139 mmol/L (ref 135–145)

## 2020-10-18 MED ORDER — CEPHALEXIN 500 MG PO CAPS: 500.0000 mg | ORAL_CAPSULE | Freq: Two times a day (BID) | ORAL | Status: DC

## 2020-10-18 MED ORDER — CEPHALEXIN 500 MG PO CAPS: 500.0000 mg | ORAL_CAPSULE | Freq: Two times a day (BID) | ORAL | 0 refills | Status: DC

## 2020-10-18 NOTE — Progress Notes (Signed)
Patient was given the orders to be discharged home. Patient was given discharge papers with instructions. Nurse went over discharge instructions with the patient and answered any questions that the patient had. Patient acknowledged and verbalized understanding of the discharge instructions. Patient was stable, got dressed, and nurse wheeled patient down to patient's ride.

## 2020-10-18 NOTE — Plan of Care (Signed)

## 2020-10-18 NOTE — Progress Notes (Signed)
PROGRESS NOTE    Marie Myers  VZD:[id number] DOB: 27-Oct-1990 DOA: 10/16/2020 PCP: [facility]    Chief Complaint  Patient presents with  . Abdominal Pain    Brief Narrative:  30 year old lady prior history of gastric bypass surgery with THC abuse, questionable cyclical vomiting syndrome, cholecystectomy, presents to ED with persistent nausea vomiting for the last 5 days. On arrival to ED she underwent urine analysis which showed many bacteria. Her symptoms persistent despite IV Phenergan droperidol and fentanyl.she was admitted for intractable nausea, and vomiting.   Patient seen and examined at bedside, she reports her nausea and abdominal pain have improved but not completely resolved.  She would like to transition to soft diet and see if she can tolerate.  Assessment & Plan:   Principal Problem:   Intractable nausea and vomiting Active Problems:   Abdominal pain   Tetrahydrocannabinol (THC) dependence (HCC)   Acute lower UTI   Tachypnea   Dehydration   Hypertensive urgency   Intractable nausea, with vomiting: Symptomatic management with IV fluids, IV anti emetics, IV protonix.  Probably sec to [facility] use.  Her symptoms of nausea and vomiting and abdominal pain have improved no vomiting in the last 24 hours.  Transition to soft diet today and if she can tolerate soft diet will probably discharge her tomorrow with oral Reglan, oral Protonix and oral antiemetics.   Abnormal UA>  Transition to oral Keflex today   THC abuse:  Counseling provided    Hypertensive urgency Resolved.    DVT prophylaxis: (lovenox.  Code Status: full code.  Family Communication: none at bedside.  Disposition:   Status is: Inpatient  Remains inpatient appropriate because:IV treatments appropriate due to intensity of illness or inability to take PO   Dispo: The patient is from: Home              Anticipated d/c is to: Home              Patient currently is not medically  stable to d/c.   Difficult to place patient No    Level of care: Telemetry    Consultants:   None.   Procedures: none.   Antimicrobials:rocephin since admission.    Subjective: Abdominal pain has improved, no vomiting, nausea has improved  Objective: Vitals:   10/17/20 0904 10/17/20 1200 10/17/20 2128 10/18/20 0628  BP: 125/83 126/86 119/71 128/82  Pulse: 87 95 93 86  Resp: 20 18    Temp: 98.3 F (36.8 C) 98.9 F (37.2 C) 98 F (36.7 C) 98.1 F (36.7 C)  TempSrc: Oral Oral Oral Oral  SpO2: 100% 100% 100% 100%  Weight:      Height:        Intake/Output Summary (Last 24 hours) at 10/18/2020 1334 Last data filed at 10/18/2020 0300 Gross per 24 hour  Intake 1940 ml  Output --  Net 1940 ml   Filed Weights   10/16/20 1713 10/17/20 0119  Weight: 79.4 kg 80.4 kg    Examination:  General exam: Alert and comfortable no distress noted Respiratory system: Clear to auscultation bilaterally, no wheezing or rhonchi Cardiovascular system: S1-S2 heard, regular rate and rhythm, no JVD, no pedal edema Gastrointestinal system: Abdomen is soft, nontender bowel sounds normal Central nervous system: Alert and oriented, grossly nonfocal Extremities: No pedal edema Skin: No rashes seen Psychiatry: Mood is appropriate    Data Reviewed: I have personally reviewed following labs and imaging studies  CBC: Recent Labs  Lab 10/15/20 1822 10/16/20  1750 10/17/20 0422  WBC 12.3* 13.3* 10.0  NEUTROABS  --  10.3*  --   HGB 15.4* 15.4* 13.2  HCT 45.9 43.9 39.2  MCV 106.0* 103.3* 107.4*  PLT 317 313 250    Basic Metabolic Panel: Recent Labs  Lab 10/15/20 1822 10/16/20 1750 10/17/20 0422 10/18/20 0916  NA 141 133* 137 139  K 3.9 3.7 3.2* 3.7  CL 104 98 105 108  CO2 23 21* 22 22  GLUCOSE 94 115* 102* 86  BUN 10 9 11 9   CREATININE 0.59 0.65 0.64 0.54  CALCIUM 9.9 9.2 8.5* 8.5*    GFR: Estimated Creatinine Clearance: 106.5 mL/min (by C-G formula based on SCr of  0.54 mg/dL).  Liver Function Tests: Recent Labs  Lab 10/15/20 1822 10/16/20 1750 10/17/20 0422  AST 21 22 14*  ALT 15 13 11   ALKPHOS 70 66 52  BILITOT 0.9 1.8* 1.4*  PROT 8.9* 8.3* 6.5  ALBUMIN 5.2* 4.8 3.8    CBG: No results for input(s): GLUCAP in the last 168 hours.   Recent Results (from the past 240 hour(s))  SARS CORONAVIRUS 2 (TAT 6-24 HRS) Nasopharyngeal Nasopharyngeal Swab     Status: None   Collection Time: 10/16/20 11:44 PM   Specimen: Nasopharyngeal Swab  Result Value Ref Range Status   SARS Coronavirus 2 NEGATIVE NEGATIVE Final    Comment: (NOTE) SARS-CoV-2 target nucleic acids are NOT DETECTED.  The SARS-CoV-2 RNA is generally detectable in upper and lower respiratory specimens during the acute phase of infection. Negative results do not preclude SARS-CoV-2 infection, do not rule out co-infections with other pathogens, and should not be used as the sole basis for treatment or other patient management decisions. Negative results must be combined with clinical observations, patient history, and epidemiological information. The expected result is Negative.  Fact Sheet for Patients:  Fact Sheet for Healthcare Providers: 10/18/20  This test is not yet approved or cleared by the [employer] FDA and  has been authorized for detection and/or diagnosis of SARS-CoV-2 by FDA under an Emergency Use Authorization (EUA). This EUA will remain  in effect (meaning this test can be used) for the duration of the COVID-19 declaration under Se ction 564(b)(1) of the Act, 21 U.S.C. section 360bbb-3(b)(1), unless the authorization is terminated or revoked sooner.  Performed at [facility], [address], [place], [address] [place]          Radiology Studies: CT Angio Chest PE W and/or Wo Contrast  Result Date: 10/16/2020 CLINICAL DATA:  Abdominal pain and nausea and vomiting for  1 day, history of chest pain EXAM: CT ANGIOGRAPHY CHEST WITH CONTRAST TECHNIQUE: Multidetector CT imaging of the chest was performed using the standard protocol during bolus administration of intravenous contrast. Multiplanar CT image reconstructions and MIPs were obtained to evaluate the vascular anatomy. CONTRAST:  75mL OMNIPAQUE IOHEXOL 350 MG/ML SOLN COMPARISON:  10/15/2020 FINDINGS: Cardiovascular: This is a technically adequate evaluation of the pulmonary vasculature. No filling defects or pulmonary emboli. The heart is unremarkable without pericardial effusion. No evidence of thoracic aortic aneurysm or dissection. Mediastinum/Nodes: No enlarged mediastinal, hilar, or axillary lymph nodes. Thyroid gland, trachea, and esophagus demonstrate no significant findings. Lungs/Pleura: No airspace disease, effusion, or pneumothorax. Central airways are patent. Upper Abdomen: Postsurgical changes from bariatric surgery. No acute process. Musculoskeletal: No acute or destructive bony lesions. Reconstructed images demonstrate no additional findings. Review of the MIP images confirms the above findings. IMPRESSION: 1. No evidence of pulmonary embolus.  2. No acute intrathoracic process. Electronically Signed   By: Sharlet Salina M.D.   On: 10/16/2020 21:50        Scheduled Meds: . metoCLOPramide (REGLAN) injection  5 mg Intravenous Q8H  . pantoprazole (PROTONIX) IV  40 mg Intravenous Daily   Continuous Infusions: . cefTRIAXone (ROCEPHIN)  IV 1 g (10/18/20 0117)  . dextrose 5 % and 0.9% NaCl 125 mL/hr at 10/18/20 1224     LOS: 2 days        Kathlen Mody, MD [facility]   To contact the attending provider between 7A-7P or the covering provider during after hours 7P-7A, please log into the web site [email or web address] and access using universal [place] password for that web site. If you do not have the password, please call the hospital operator.  10/18/2020, 1:34 PM

## 2020-10-18 NOTE — Discharge Instructions (Signed)
Dehydration, Adult °Dehydration is condition in which there is not enough water or other fluids in the body. This happens when a person loses more fluids than he or she takes in. Important body parts cannot work right without the right amount of fluids. Any loss of fluids from the body can cause dehydration. °Dehydration can be mild, worse, or very bad. It should be treated right away to keep it from getting very bad. °What are the causes? °This condition may be caused by: °· Conditions that cause loss of water or other fluids, such as: °? Watery poop (diarrhea). °? Vomiting. °? Sweating a lot. °? Peeing (urinating) a lot. °· Not drinking enough fluids, especially when you: °? Are ill. °? Are doing things that take a lot of energy to do. °· Other illnesses and conditions, such as fever or infection. °· Certain medicines, such as medicines that take extra fluid out of the body (diuretics). °· Lack of safe drinking water. °· Not being able to get enough water and food. °What increases the risk? °The following factors may make you more likely to develop this condition: °· Having a long-term (chronic) illness that has not been treated the right way, such as: °? Diabetes. °? Heart disease. °? Kidney disease. °· Being 65 years of age or older. °· Having a disability. °· Living in a place that is high above the ground or sea (high in altitude). The thinner, dried air causes more fluid loss. °· Doing exercises that put stress on your body for a long time. °What are the signs or symptoms? °Symptoms of dehydration depend on how bad it is. °Mild or worse dehydration °· Thirst. °· Dry lips or dry mouth. °· Feeling dizzy or light-headed, especially when you stand up from sitting. °· Muscle cramps. °· Your body making: °? Dark pee (urine). Pee may be the color of tea. °? Less pee than normal. °? Less tears than normal. °· Headache. °Very bad dehydration °· Changes in skin. Skin may: °? Be cold to the touch (clammy). °? Be blotchy  or pale. °? Not go back to normal right after you lightly pinch it and let it go. °· Little or no tears, pee, or sweat. °· Changes in vital signs, such as: °? Fast breathing. °? Low blood pressure. °? Weak pulse. °? Pulse that is more than 100 beats a minute when you are sitting still. °· Other changes, such as: °? Feeling very thirsty. °? Eyes that look hollow (sunken). °? Cold hands and feet. °? Being mixed up (confused). °? Being very tired (lethargic) or having trouble waking from sleep. °? Short-term weight loss. °? Loss of consciousness. °How is this treated? °Treatment for this condition depends on how bad it is. Treatment should start right away. Do not wait until your condition gets very bad. Very bad dehydration is an emergency. You will need to go to a hospital. °· Mild or worse dehydration can be treated at home. You may be asked to: °? Drink more fluids. °? Drink an oral rehydration solution (ORS). This drink helps get the right amounts of fluids and salts and minerals in the blood (electrolytes). °· Very bad dehydration can be treated: °? With fluids through an IV tube. °? By getting normal levels of salts and minerals in your blood. This is often done by giving salts and minerals through a tube. The tube is passed through your nose and into your stomach. °? By treating the root cause. °Follow these instructions at   home: Oral rehydration solution If told by your doctor, drink an ORS:  Make an ORS. Use instructions on the package.  Start by drinking small amounts, about  cup (120 mL) every 5-10 minutes.  Slowly drink more until you have had the amount that your doctor said to have. Eating and drinking  Drink enough clear fluid to keep your pee pale yellow. If you were told to drink an ORS, finish the ORS first. Then, start slowly drinking other clear fluids. Drink fluids such as: ? Water. Do not drink only water. Doing that can make the salt (sodium) level in your body get too low. ? Water  from ice chips you suck on. ? Fruit juice that you have added water to (diluted). ? Low-calorie sports drinks.  Eat foods that have the right amounts of salts and minerals, such as: ? Bananas. ? Oranges. ? Potatoes. ? Tomatoes. ? Spinach.  Do not drink alcohol.  Avoid: ? Drinks that have a lot of sugar. These include:  High-calorie sports drinks.  Fruit juice that you did not add water to.  Soda.  Caffeine. ? Foods that are greasy or have a lot of fat or sugar.         General instructions  Take over-the-counter and prescription medicines only as told by your doctor.  Do not take salt tablets. Doing that can make the salt level in your body get too high.  Return to your normal activities as told by your doctor. Ask your doctor what activities are safe for you.  Keep all follow-up visits as told by your doctor. This is important. Contact a doctor if:  You have pain in your belly (abdomen) and the pain: ? Gets worse. ? Stays in one place.  You have a rash.  You have a stiff neck.  You get angry or annoyed (irritable) more easily than normal.  You are more tired or have a harder time waking than normal.  You feel: ? Weak or dizzy. ? Very thirsty. Get help right away if you have:  Any symptoms of very bad dehydration.  Symptoms of vomiting, such as: ? You cannot eat or drink without vomiting. ? Your vomiting gets worse or does not go away. ? Your vomit has blood or green stuff in it.  Symptoms that get worse with treatment.  A fever.  A very bad headache.  Problems with peeing or pooping (having a bowel movement), such as: ? Watery poop that gets worse or does not go away. ? Blood in your poop (stool). This may cause poop to look black and tarry. ? Not peeing in 6-8 hours. ? Peeing only a small amount of very dark pee in 6-8 hours.  Trouble breathing. These symptoms may be an emergency. Do not wait to see if the symptoms will go away. Get  medical help right away. Call your local emergency services (911 in the [place] Do not drive yourself to the hospital. Summary  Dehydration is a condition in which there is not enough water or other fluids in the body. This happens when a person loses more fluids than he or she takes in.  Treatment for this condition depends on how bad it is. Treatment should be started right away. Do not wait until your condition gets very bad.  Drink enough clear fluid to keep your pee pale yellow. If you were told to drink an oral rehydration solution (ORS), finish the ORS first. Then, start slowly drinking other clear fluids.  Take over-the-counter and prescription medicines only as told by your doctor.  Get help right away if you have any symptoms of very bad dehydration. This information is not intended to replace advice given to you by your health care provider. Make sure you discuss any questions you have with your health care provider. Document Revised: 03/23/2019 Document Reviewed: 03/23/2019 Elsevier Patient Education  2021 [employer]   Abdominal Pain, Adult Many things can cause belly (abdominal) pain. Most times, belly pain is not dangerous. Many cases of belly pain can be watched and treated at home. Sometimes, though, belly pain is serious. Your doctor will try to find the cause of your belly pain. Follow these instructions at home: Medicines  Take over-the-counter and prescription medicines only as told by your doctor.  Do not take medicines that help you poop (laxatives) unless told by your doctor. General instructions  Watch your belly pain for any changes.  Drink enough fluid to keep your pee (urine) pale yellow.  Keep all follow-up visits as told by your doctor. This is important.   Contact a doctor if:  Your belly pain changes or gets worse.  You are not hungry, or you lose weight without trying.  You are having trouble pooping (constipated) or have watery poop (diarrhea)  for more than 2-3 days.  You have pain when you pee or poop.  Your belly pain wakes you up at night.  Your pain gets worse with meals, after eating, or with certain foods.  You are vomiting and cannot keep anything down.  You have a fever.  You have blood in your pee. Get help right away if:  Your pain does not go away as soon as your doctor says it should.  You cannot stop vomiting.  Your pain is only in areas of your belly, such as the right side or the left lower part of the belly.  You have bloody or black poop, or poop that looks like tar.  You have very bad pain, cramping, or bloating in your belly.  You have signs of not having enough fluid or water in your body (dehydration), such as: ? Dark pee, very little pee, or no pee. ? Cracked lips. ? Dry mouth. ? Sunken eyes. ? Sleepiness. ? Weakness.  You have trouble breathing or chest pain. Summary  Many cases of belly pain can be watched and treated at home.  Watch your belly pain for any changes.  Take over-the-counter and prescription medicines only as told by your doctor.  Contact a doctor if your belly pain changes or gets worse.  Get help right away if you have very bad pain, cramping, or bloating in your belly. This information is not intended to replace advice given to you by your health care provider. Make sure you discuss any questions you have with your health care provider. Document Revised: 12/19/2018 Document Reviewed: 12/19/2018 Elsevier Patient Education  2021 [employer].

## 2020-10-19 NOTE — Discharge Summary (Signed)
Physician Discharge Summary  Marie Myers LGX:[id number] DOB: 1990/11/29 DOA: 10/16/2020  PCP: [facility]  Admit date: 10/16/2020 Discharge date: 10/18/2020  Admitted From: Home.  Disposition: home.   Recommendations for Outpatient Follow-up:  1. Follow up with PCP in 1-2 weeks 2. Please obtain BMP/CBC in one week Please follow up with gastroenterology in 1 week.   Discharge Condition: stable.  CODE STATUS: full code.  Diet recommendation: Heart Healthy    Brief/Interim Summary: 30 year old lady prior history of gastric bypass surgery with THC abuse, questionable cyclical vomiting syndrome, cholecystectomy, presents to ED with persistent nausea vomiting for the last 5 days. On arrival to ED she underwent urine analysis which showed many bacteria. Her symptoms persistent despite IV Phenergan droperidol and fentanyl.she was admitted for intractable nausea, and vomiting.   Patient seen and examined at bedside, she reports her nausea and abdominal pain have resolved and she was able to tolerate soft diet at two different meals and would like to go home.  She was discharged and recommended to follow up with GI in 1 to 2 weeks.   Discharge Diagnoses:  Principal Problem:   Intractable nausea and vomiting Active Problems:   Abdominal pain   Tetrahydrocannabinol (THC) dependence (HCC)   Acute lower UTI   Tachypnea   Dehydration   Hypertensive urgency   Intractable nausea, with vomiting: Symptomatic management with IV fluids, IV anti emetics, IV protonix.  Probably sec to [facility] use.  Her symptoms of nausea and vomiting and abdominal pain have resolved.  Transition to soft diet today and if she can tolerate soft diet will probably discharge her today.   Abnormal UA; unfortunately urine cultures were not done.  Transition to oral Keflex for one more day of antibiotics to complete a 3 day course.    THC abuse:  Counseling provided    Hypertensive  urgency Resolved.   Dehydration  Resolved.   Discharge Instructions  Discharge Instructions    Diet - low sodium heart healthy   Complete by: As directed    Discharge instructions   Complete by: As directed    Please follow up with gastroenterology in one week   Increase activity slowly   Complete by: As directed    No wound care   Complete by: As directed      Allergies as of 10/18/2020      Reactions   Zithromax [azithromycin] Hives      Medication List    TAKE these medications   acetaminophen 500 MG tablet Commonly known as: TYLENOL Take 1,500 mg by mouth every 6 (six) hours as needed for moderate pain.   amitriptyline 100 MG tablet Commonly known as: ELAVIL Take 100 mg by mouth at bedtime as needed for sleep.   cephALEXin 500 MG capsule Commonly known as: KEFLEX Take 1 capsule (500 mg total) by mouth every 12 (twelve) hours.   dicyclomine 20 MG tablet Commonly known as: BENTYL Take 20 mg by mouth in the morning and at bedtime.   diphenhydrAMINE 25 MG tablet Commonly known as: BENADRYL Take 25 mg by mouth every 6 (six) hours as needed for itching or allergies.   omeprazole 20 MG capsule Commonly known as: PRILOSEC Take 40 mg by mouth daily.   promethazine 25 MG tablet Commonly known as: PHENERGAN Take 1 tablet (25 mg total) by mouth every 6 (six) hours as needed for nausea or vomiting.   traMADol 50 MG tablet Commonly known as: ULTRAM Take 50 mg by mouth every 6 (  six) hours as needed for moderate pain.       Follow-up Information    [facility]. Schedule an appointment as soon as possible for a visit in 1 week(s).   Contact information: [address] [place] [address] [phone]              Allergies  Allergen Reactions  . Zithromax [Azithromycin] Hives    Consultations:  None.    Procedures/Studies: DG Chest 2 View  Result Date: 10/15/2020 CLINICAL DATA:  Chest pain. EXAM: CHEST - 2 VIEW COMPARISON:   June 26, 2020. FINDINGS: The heart size and mediastinal contours are within normal limits. Both lungs are clear. No pneumothorax or pleural effusion is noted. The visualized skeletal structures are unremarkable. IMPRESSION: No active cardiopulmonary disease. Electronically Signed   By: Lupita Raider M.D.   On: 10/15/2020 16:45   CT Angio Chest PE W and/or Wo Contrast  Result Date: 10/16/2020 CLINICAL DATA:  Abdominal pain and nausea and vomiting for 1 day, history of chest pain EXAM: CT ANGIOGRAPHY CHEST WITH CONTRAST TECHNIQUE: Multidetector CT imaging of the chest was performed using the standard protocol during bolus administration of intravenous contrast. Multiplanar CT image reconstructions and MIPs were obtained to evaluate the vascular anatomy. CONTRAST:  34mL OMNIPAQUE IOHEXOL 350 MG/ML SOLN COMPARISON:  10/15/2020 FINDINGS: Cardiovascular: This is a technically adequate evaluation of the pulmonary vasculature. No filling defects or pulmonary emboli. The heart is unremarkable without pericardial effusion. No evidence of thoracic aortic aneurysm or dissection. Mediastinum/Nodes: No enlarged mediastinal, hilar, or axillary lymph nodes. Thyroid gland, trachea, and esophagus demonstrate no significant findings. Lungs/Pleura: No airspace disease, effusion, or pneumothorax. Central airways are patent. Upper Abdomen: Postsurgical changes from bariatric surgery. No acute process. Musculoskeletal: No acute or destructive bony lesions. Reconstructed images demonstrate no additional findings. Review of the MIP images confirms the above findings. IMPRESSION: 1. No evidence of pulmonary embolus. 2. No acute intrathoracic process. Electronically Signed   By: Sharlet Salina M.D.   On: 10/16/2020 21:50   CT ABDOMEN PELVIS W CONTRAST  Result Date: 10/15/2020 CLINICAL DATA:  Abdominal pain. EXAM: CT ABDOMEN AND PELVIS WITH CONTRAST TECHNIQUE: Multidetector CT imaging of the abdomen and pelvis was performed using  the standard protocol following bolus administration of intravenous contrast. CONTRAST:  OMNIPAQUE IOHEXOL 300 MG/ML  SOLN COMPARISON:  August 19, 2020 FINDINGS: Lower chest: No acute abnormality. Hepatobiliary: No focal liver abnormality is seen. Status post cholecystectomy. No biliary dilatation. Pancreas: Unremarkable. No pancreatic ductal dilatation or surrounding inflammatory changes. Spleen: Normal in size without focal abnormality. Adrenals/Urinary Tract: Adrenal glands are unremarkable. Kidneys are normal, without renal calculi, focal lesion, or hydronephrosis. Bladder is unremarkable. Stomach/Bowel: There is a small hiatal hernia. Multiple surgical sutures are seen within the gastric region. Appendix appears normal. No evidence of bowel wall thickening, distention, or inflammatory changes. Vascular/Lymphatic: No significant vascular findings are present. No enlarged abdominal or pelvic lymph nodes. Reproductive: Uterus and bilateral adnexa are unremarkable. Other: No abdominal wall hernia or abnormality. No abdominopelvic ascites. Musculoskeletal: No acute or significant osseous findings. IMPRESSION: 1. Evidence of prior gastric bypass surgery. 2. Small hiatal hernia. 3. Evidence of prior cholecystectomy. Electronically Signed   By: Aram Candela M.D.   On: 10/15/2020 23:24       Subjective:  No new complaints.  Discharge Exam: Vitals:   10/18/20 0628 10/18/20 1553  BP: 128/82 (!) 126/91  Pulse: 86 91  Resp:    Temp: 98.1 F (36.7  C) 98.1 F (36.7 C)  SpO2: 100% 100%   Vitals:   10/17/20 1200 10/17/20 2128 10/18/20 0628 10/18/20 1553  BP: 126/86 119/71 128/82 (!) 126/91  Pulse: 95 93 86 91  Resp: 18     Temp: 98.9 F (37.2 C) 98 F (36.7 C) 98.1 F (36.7 C) 98.1 F (36.7 C)  TempSrc: Oral Oral Oral Oral  SpO2: 100% 100% 100% 100%  Weight:      Height:        General: Pt is alert, awake, not in acute distress Cardiovascular: RRR, S1/S2 +, no rubs, no  gallops Respiratory: CTA bilaterally, no wheezing, no rhonchi Abdominal: Soft, NT, ND, bowel sounds + Extremities: no edema, no cyanosis    The results of significant diagnostics from this hospitalization (including imaging, microbiology, ancillary and laboratory) are listed below for reference.     Microbiology: Recent Results (from the past 240 hour(s))  SARS CORONAVIRUS 2 (TAT 6-24 HRS) Nasopharyngeal Nasopharyngeal Swab     Status: None   Collection Time: 10/16/20 11:44 PM   Specimen: Nasopharyngeal Swab  Result Value Ref Range Status   SARS Coronavirus 2 NEGATIVE NEGATIVE Final    Comment: (NOTE) SARS-CoV-2 target nucleic acids are NOT DETECTED.  The SARS-CoV-2 RNA is generally detectable in upper and lower respiratory specimens during the acute phase of infection. Negative results do not preclude SARS-CoV-2 infection, do not rule out co-infections with other pathogens, and should not be used as the sole basis for treatment or other patient management decisions. Negative results must be combined with clinical observations, patient history, and epidemiological information. The expected result is Negative.  Fact Sheet for Patients: HairSlick.nohttps://www.fda.gov/media/138098/download  Fact Sheet for Healthcare Providers: quierodirigir.comhttps://www.fda.gov/media/138095/download  This test is not yet approved or cleared by the [place] FDA and  has been authorized for detection and/or diagnosis of SARS-CoV-2 by FDA under an Emergency Use Authorization (EUA). This EUA will remain  in effect (meaning this test can be used) for the duration of the COVID-19 declaration under Se ction 564(b)(1) of the Act, 21 U.S.C. section 360bbb-3(b)(1), unless the authorization is terminated or revoked sooner.  Performed at [facility], [address] [place], [place] [address]      Labs: BNP (last 3 results) No results for input(s): BNP in the last 8760 hours. Basic Metabolic Panel: Recent Labs   Lab 10/15/20 1822 10/16/20 1750 10/17/20 0422 10/18/20 0916  NA 141 133* 137 139  K 3.9 3.7 3.2* 3.7  CL 104 98 105 108  CO2 23 21* 22 22  GLUCOSE 94 115* 102* 86  BUN 10 9 11 9   CREATININE 0.59 0.65 0.64 0.54  CALCIUM 9.9 9.2 8.5* 8.5*   Liver Function Tests: Recent Labs  Lab 10/15/20 1822 10/16/20 1750 10/17/20 0422  AST 21 22 14*  ALT 15 13 11   ALKPHOS 70 66 52  BILITOT 0.9 1.8* 1.4*  PROT 8.9* 8.3* 6.5  ALBUMIN 5.2* 4.8 3.8   Recent Labs  Lab 10/15/20 1822 10/16/20 1750  LIPASE 27 25   No results for input(s): AMMONIA in the last 168 hours. CBC: Recent Labs  Lab 10/15/20 1822 10/16/20 1750 10/17/20 0422  WBC 12.3* 13.3* 10.0  NEUTROABS  --  10.3*  --   HGB 15.4* 15.4* 13.2  HCT 45.9 43.9 39.2  MCV 106.0* 103.3* 107.4*  PLT 317 313 250   Cardiac Enzymes: No results for input(s): CKTOTAL, CKMB, CKMBINDEX, TROPONINI in the last 168 hours. BNP: Invalid input(s): POCBNP CBG:  No results for input(s): GLUCAP in the last 168 hours. D-Dimer No results for input(s): DDIMER in the last 72 hours. Hgb A1c No results for input(s): HGBA1C in the last 72 hours. Lipid Profile No results for input(s): CHOL, HDL, LDLCALC, TRIG, CHOLHDL, LDLDIRECT in the last 72 hours. Thyroid function studies Recent Labs    10/16/20 1750  TSH 1.454   Anemia work up No results for input(s): VITAMINB12, FOLATE, FERRITIN, TIBC, IRON, RETICCTPCT in the last 72 hours. Urinalysis    Component Value Date/Time   COLORURINE AMBER (A) 10/16/2020 1803   APPEARANCEUR CLOUDY (A) 10/16/2020 1803   LABSPEC 1.033 (H) 10/16/2020 1803   PHURINE 5.0 10/16/2020 1803   GLUCOSEU NEGATIVE 10/16/2020 1803   HGBUR SMALL (A) 10/16/2020 1803   BILIRUBINUR NEGATIVE 10/16/2020 1803   KETONESUR 80 (A) 10/16/2020 1803   PROTEINUR 100 (A) 10/16/2020 1803   NITRITE NEGATIVE 10/16/2020 1803   LEUKOCYTESUR NEGATIVE 10/16/2020 1803   Sepsis Labs Invalid input(s): PROCALCITONIN,  WBC,   LACTICIDVEN Microbiology Recent Results (from the past 240 hour(s))  SARS CORONAVIRUS 2 (TAT 6-24 HRS) Nasopharyngeal Nasopharyngeal Swab     Status: None   Collection Time: 10/16/20 11:44 PM   Specimen: Nasopharyngeal Swab  Result Value Ref Range Status   SARS Coronavirus 2 NEGATIVE NEGATIVE Final    Comment: (NOTE) SARS-CoV-2 target nucleic acids are NOT DETECTED.  The SARS-CoV-2 RNA is generally detectable in upper and lower respiratory specimens during the acute phase of infection. Negative results do not preclude SARS-CoV-2 infection, do not rule out co-infections with other pathogens, and should not be used as the sole basis for treatment or other patient management decisions. Negative results must be combined with clinical observations, patient history, and epidemiological information. The expected result is Negative.  Fact Sheet for Patients: HairSlick.no  Fact Sheet for Healthcare Providers: quierodirigir.com  This test is not yet approved or cleared by the [place] FDA and  has been authorized for detection and/or diagnosis of SARS-CoV-2 by FDA under an Emergency Use Authorization (EUA). This EUA will remain  in effect (meaning this test can be used) for the duration of the COVID-19 declaration under Se ction 564(b)(1) of the Act, 21 U.S.C. section 360bbb-3(b)(1), unless the authorization is terminated or revoked sooner.  Performed at [facility], [address], [place], [place] [address]      Time coordinating discharge: 32 minutes.   SIGNED:   Kathlen Mody, MD  [facility] 10/19/2020, 9:36 AM

## 2020-10-20 ENCOUNTER — Inpatient Hospital Stay (HOSPITAL_COMMUNITY)
Admission: EM | Admit: 2020-10-20 | Discharge: 2020-10-23 | DRG: 641 | Disposition: A | Discharge status: Home / Self Care | Payer: 59 | Attending: Family Medicine | Admitting: Family Medicine

## 2020-10-20 ENCOUNTER — Other Ambulatory Visit: Payer: Self-pay

## 2020-10-20 DIAGNOSIS — R1013 Epigastric pain: Secondary | ICD-10-CM | POA: Diagnosis present

## 2020-10-20 DIAGNOSIS — I160 Hypertensive urgency: Secondary | ICD-10-CM | POA: Diagnosis present

## 2020-10-20 DIAGNOSIS — E872 Acidosis, unspecified: Secondary | ICD-10-CM | POA: Diagnosis present

## 2020-10-20 DIAGNOSIS — R112 Nausea with vomiting, unspecified: Secondary | ICD-10-CM | POA: Diagnosis present

## 2020-10-20 DIAGNOSIS — R824 Acetonuria: Secondary | ICD-10-CM | POA: Diagnosis present

## 2020-10-20 DIAGNOSIS — Z20822 Contact with and (suspected) exposure to covid-19: Secondary | ICD-10-CM | POA: Diagnosis present

## 2020-10-20 DIAGNOSIS — R6510 Systemic inflammatory response syndrome (SIRS) of non-infectious origin without acute organ dysfunction: Secondary | ICD-10-CM | POA: Diagnosis present

## 2020-10-20 DIAGNOSIS — Z9884 Bariatric surgery status: Secondary | ICD-10-CM

## 2020-10-20 LAB — URINALYSIS, ROUTINE W REFLEX MICROSCOPIC
Bacteria, UA: NONE SEEN
Bilirubin Urine: NEGATIVE
Glucose, UA: NEGATIVE mg/dL
Ketones, ur: 80 mg/dL — AB
Leukocytes,Ua: NEGATIVE
Nitrite: NEGATIVE
Protein, ur: 30 mg/dL — AB
Specific Gravity, Urine: 1.017 (ref 1.005–1.030)
pH: 5 (ref 5.0–8.0)

## 2020-10-20 MED ORDER — HYDROMORPHONE HCL 1 MG/ML IJ SOLN
1.0000 mg | Freq: Once | INTRAMUSCULAR | Status: DC
Start: 2020-10-20 — End: 2020-10-21
  Filled 2020-10-20: qty 1

## 2020-10-20 MED ORDER — ONDANSETRON HCL 4 MG/2ML IJ SOLN: 4.0000 mg | Freq: Once | INTRAMUSCULAR | Status: DC

## 2020-10-20 MED ORDER — SODIUM CHLORIDE 0.9 % IV BOLUS
1000.0000 mL | Freq: Once | INTRAVENOUS | Status: AC
  Administered 2020-10-21: 1000 mL via INTRAVENOUS

## 2020-10-20 NOTE — ED Triage Notes (Signed)
Pt came in with c/o epigastric abdominal pain and N/V. Pt recently discharged from hospital on Friday for same. Pt states the N/V started again this morning. Pt has hx of gastric bypass in 2017

## 2020-10-20 NOTE — ED Provider Notes (Signed)
[facility]-EMERGENCY DEPT Provider Note   CSN: [id number] Arrival date & time: 10/20/20  2208     History Chief Complaint  Patient presents with   Emesis    Marie Myers is a 30 y.o. female.  Patient presents to the ED with a chief complaint of abdominal pain and nausea and vomiting.  She was recently admitted for intractable nausea and vomiting.  She states that the pain returned this morning.  She has tried taking promethazine without any relief.  She states that she has not used any marijuana in over a week.  She states that her BMs have been normal, except that she hasn't had one today.  She rates her pain as severe.  Surgical hx includes: Cholecystectomy and gastric bipass  The history is provided by the patient. No language interpreter was used.       No past medical history on file.  Patient Active Problem List   Diagnosis Date Noted   Tetrahydrocannabinol [facility]) dependence (HCC) 10/16/2020   Acute lower UTI 10/16/2020   Tachypnea 10/16/2020   Dehydration 10/16/2020   Hypertensive urgency 10/16/2020   Intractable nausea and vomiting 10/16/2020   Abdominal pain 08/11/2019    Past Surgical History:  Procedure Laterality Date   CHOLECYSTECTOMY     GASTRIC BYPASS     LAPAROSCOPY N/A 08/11/2019   Procedure: LAPAROSCOPY DIAGNOSTIC;  Surgeon: Harriette Bouillon, MD;  Location: [facility] OR;  Service: General;  Laterality: N/A;     OB History   No obstetric history on file.     No family history on file.  Social History   Tobacco Use   Smoking status: Never Smoker   Smokeless tobacco: Never Used  Substance Use Topics   Alcohol use: Yes    Comment: occasionally   Drug use: Never    Home Medications Prior to Admission medications   Medication Sig Start Date End Date Taking? Authorizing Provider  acetaminophen (TYLENOL) 500 MG tablet Take 1,500 mg by mouth every 6 (six) hours as needed for moderate pain.    [provider]  amitriptyline (ELAVIL) 100 MG tablet Take 100 mg by mouth at bedtime as needed for sleep. 10/14/20   [provider]  cephALEXin (KEFLEX) 500 MG capsule Take 1 capsule (500 mg total) by mouth every 12 (twelve) hours. 10/19/20   Kathlen Mody, MD  dicyclomine (BENTYL) 20 MG tablet Take 20 mg by mouth in the morning and at bedtime.    [provider]  diphenhydrAMINE (BENADRYL) 25 MG tablet Take 25 mg by mouth every 6 (six) hours as needed for itching or allergies.    [provider]  omeprazole (PRILOSEC) 20 MG capsule Take 40 mg by mouth daily.    [provider]  promethazine (PHENERGAN) 25 MG tablet Take 1 tablet (25 mg total) by mouth every 6 (six) hours as needed for nausea or vomiting. 06/26/20   Mare Ferrari, PA-C  traMADol (ULTRAM) 50 MG tablet Take 50 mg by mouth every 6 (six) hours as needed for moderate pain. 09/17/20   [provider]    Allergies    Zithromax [azithromycin]  Review of Systems   Review of Systems  All other systems reviewed and are negative.   Physical Exam Updated Vital Signs BP (!) 179/113    Pulse 88    Temp 98.7 F (37.1 C) (Oral)    Resp (!) 21    Ht 5\' 4"  (1.626 m)    Wt 80.3 kg  LMP 10/06/2020    SpO2 100%    BMI 30.38 kg/m   Physical Exam Vitals and nursing note reviewed.  Constitutional:      General: She is not in acute distress.    Appearance: She is well-developed and well-nourished.  HENT:     Head: Normocephalic and atraumatic.  Eyes:     Conjunctiva/sclera: Conjunctivae normal.  Cardiovascular:     Rate and Rhythm: Normal rate and regular rhythm.     Heart sounds: No murmur heard.   Pulmonary:     Effort: Pulmonary effort is normal. No respiratory distress.     Breath sounds: Normal breath sounds.  Abdominal:     Palpations: Abdomen is soft.     Tenderness: There is abdominal tenderness.     Comments: Generalized abdominal tenderness, but abdomen is soft  Musculoskeletal:         General: No edema. Normal range of motion.     Cervical back: Neck supple.  Skin:    General: Skin is warm and dry.  Neurological:     Mental Status: She is alert.  Psychiatric:        Mood and Affect: Mood and affect and mood normal.        Behavior: Behavior normal.     ED Results / Procedures / Treatments   Labs (all labs ordered are listed, but only abnormal results are displayed) Labs Reviewed  CBC WITH DIFFERENTIAL/PLATELET  COMPREHENSIVE METABOLIC PANEL  LACTIC ACID, PLASMA  LACTIC ACID, PLASMA  URINALYSIS, ROUTINE W REFLEX MICROSCOPIC  LIPASE, BLOOD    EKG None  Radiology No results found.  Procedures Procedures   Medications Ordered in ED Medications  sodium chloride 0.9 % bolus 1,000 mL (has no administration in time range)  HYDROmorphone (DILAUDID) injection 1 mg (has no administration in time range)  ondansetron (ZOFRAN) injection 4 mg (has no administration in time range)    ED Course  I have reviewed the triage vital signs and the nursing notes.  Pertinent labs & imaging results that were available during my care of the patient were reviewed by me and considered in my medical decision making (see chart for details).    MDM Rules/Calculators/A&P                          This patient complains of nausea and vomiting, this involves an extensive number of treatment options, and is a complaint that carries with it a high risk of complications and morbidity.    Differential Dx SBO, gastro, pancreatitis, cyclical vomiting syndrome  Pertinent Labs I ordered, reviewed, and interpreted labs, which included CBC notable for mild leukocytosis to 13.4, normal lactic, normal lipase, urinalysis shows 80 ketones.  Imaging Interpretation Recent CT abdomen/pelvis and CT PE study reviewed from most recent admission.  No acute findings that would cause patient's symptoms.  Do not feel the patient requires repeat imaging tonight.  Medications I ordered medication  Zofran, Phenergan, Vicodin, fluids for nausea, abdominal pain.  Sources Previous records obtained and reviewed recent admission for intractable vomiting, thought to have been secondary to cannabis.   Critical Interventions  None  Reassessments After the interventions stated above, I reevaluated the patient and found still feeling very uncomfortable and nauseated.  Consultants Appreciate Dr. Antionette Char for readmitting the patient.  Plan Admit    Final Clinical Impression(s) / ED Diagnoses Final diagnoses:  Intractable vomiting with nausea, unspecified vomiting type    Rx / DC Orders  ED Discharge Orders    None       Roxy Horseman, Cordelia Poche 10/21/20 0420    Gerhard Munch, MD 10/22/20 [phone]

## 2020-10-21 ENCOUNTER — Encounter (HOSPITAL_COMMUNITY): Payer: Self-pay | Admitting: Family Medicine

## 2020-10-21 DIAGNOSIS — I160 Hypertensive urgency: Secondary | ICD-10-CM | POA: Diagnosis not present

## 2020-10-21 DIAGNOSIS — E872 Acidosis, unspecified: Secondary | ICD-10-CM | POA: Diagnosis present

## 2020-10-21 DIAGNOSIS — R6510 Systemic inflammatory response syndrome (SIRS) of non-infectious origin without acute organ dysfunction: Secondary | ICD-10-CM | POA: Diagnosis present

## 2020-10-21 DIAGNOSIS — R112 Nausea with vomiting, unspecified: Secondary | ICD-10-CM

## 2020-10-21 LAB — CBC WITH DIFFERENTIAL/PLATELET
Abs Immature Granulocytes: 0.05 10*3/uL (ref 0.00–0.07)
Basophils Absolute: 0.1 10*3/uL (ref 0.0–0.1)
Basophils Relative: 0 %
Eosinophils Absolute: 0 10*3/uL (ref 0.0–0.5)
Eosinophils Relative: 0 %
HCT: 41.4 % (ref 36.0–46.0)
Hemoglobin: 14.3 g/dL (ref 12.0–15.0)
Immature Granulocytes: 0 %
Lymphocytes Relative: 9 %
Lymphs Abs: 1.2 10*3/uL (ref 0.7–4.0)
MCH: 35.8 pg — ABNORMAL HIGH (ref 26.0–34.0)
MCHC: 34.5 g/dL (ref 30.0–36.0)
MCV: 103.5 fL — ABNORMAL HIGH (ref 80.0–100.0)
Monocytes Absolute: 0.9 10*3/uL (ref 0.1–1.0)
Monocytes Relative: 7 %
Neutro Abs: 11.3 10*3/uL — ABNORMAL HIGH (ref 1.7–7.7)
Neutrophils Relative %: 84 %
Platelets: 315 10*3/uL (ref 150–400)
RBC: 4 MIL/uL (ref 3.87–5.11)
RDW: 10.9 % — ABNORMAL LOW (ref 11.5–15.5)
WBC: 13.4 10*3/uL — ABNORMAL HIGH (ref 4.0–10.5)
nRBC: 0 % (ref 0.0–0.2)

## 2020-10-21 LAB — COMPREHENSIVE METABOLIC PANEL
ALT: 16 U/L (ref 0–44)
AST: 23 U/L (ref 15–41)
Albumin: 4.7 g/dL (ref 3.5–5.0)
Alkaline Phosphatase: 71 U/L (ref 38–126)
Anion gap: 19 — ABNORMAL HIGH (ref 5–15)
BUN: 6 mg/dL (ref 6–20)
CO2: 19 mmol/L — ABNORMAL LOW (ref 22–32)
Calcium: 9.7 mg/dL (ref 8.9–10.3)
Chloride: 97 mmol/L — ABNORMAL LOW (ref 98–111)
Creatinine, Ser: 0.49 mg/dL (ref 0.44–1.00)
GFR, Estimated: >60 mL/min (ref >60)
Glucose, Bld: 101 mg/dL — ABNORMAL HIGH (ref 70–99)
Potassium: 3.5 mmol/L (ref 3.5–5.1)
Sodium: 135 mmol/L (ref 135–145)
Total Bilirubin: 1.4 mg/dL — ABNORMAL HIGH (ref 0.3–1.2)
Total Protein: 8.5 g/dL — ABNORMAL HIGH (ref 6.5–8.1)

## 2020-10-21 LAB — RESP PANEL BY RT-PCR (FLU A&B, COVID) ARPGX2
Influenza A by PCR: NEGATIVE
Influenza B by PCR: NEGATIVE
SARS Coronavirus 2 by RT PCR: NEGATIVE

## 2020-10-21 LAB — LIPASE, BLOOD: Lipase: 24 U/L (ref 11–51)

## 2020-10-21 LAB — LACTIC ACID, PLASMA: Lactic Acid, Venous: 1.8 mmol/L (ref 0.5–1.9)

## 2020-10-21 MED ORDER — SENNOSIDES-DOCUSATE SODIUM 8.6-50 MG PO TABS: 1.0000 | ORAL_TABLET | Freq: Every evening | ORAL | Status: DC | PRN

## 2020-10-21 MED ORDER — ACETAMINOPHEN 650 MG RE SUPP: 650.0000 mg | Freq: Four times a day (QID) | RECTAL | Status: DC | PRN

## 2020-10-21 MED ORDER — FAMOTIDINE IN NACL 20-0.9 MG/50ML-% IV SOLN
20.0000 mg | Freq: Two times a day (BID) | INTRAVENOUS | Status: DC
  Administered 2020-10-21: 20 mg via INTRAVENOUS
  Filled 2020-10-21: qty 50

## 2020-10-21 MED ORDER — HYDROCODONE-ACETAMINOPHEN 5-325 MG PO TABS
2.0000 | ORAL_TABLET | Freq: Once | ORAL | Status: AC
  Administered 2020-10-21: 2 via ORAL
  Filled 2020-10-21: qty 2

## 2020-10-21 MED ORDER — PROMETHAZINE HCL 25 MG/ML IJ SOLN
25.0000 mg | Freq: Once | INTRAMUSCULAR | Status: AC
  Administered 2020-10-21: 25 mg via INTRAVENOUS
  Filled 2020-10-21: qty 1

## 2020-10-21 MED ORDER — SODIUM CHLORIDE 0.9 % IV SOLN: INTRAVENOUS | Status: DC

## 2020-10-21 MED ORDER — ACETAMINOPHEN 325 MG PO TABS: 650.0000 mg | ORAL_TABLET | Freq: Four times a day (QID) | ORAL | Status: DC | PRN

## 2020-10-21 MED ORDER — LABETALOL HCL 5 MG/ML IV SOLN: 10.0000 mg | Freq: Four times a day (QID) | INTRAVENOUS | Status: DC | PRN

## 2020-10-21 MED ORDER — ONDANSETRON 4 MG PO TBDP
4.0000 mg | ORAL_TABLET | Freq: Once | ORAL | Status: DC
  Filled 2020-10-21: qty 1

## 2020-10-21 MED ORDER — SODIUM CHLORIDE 0.9 % IV BOLUS
1000.0000 mL | Freq: Once | INTRAVENOUS | Status: AC
  Administered 2020-10-21: 1000 mL via INTRAVENOUS

## 2020-10-21 MED ORDER — KETOROLAC TROMETHAMINE 15 MG/ML IJ SOLN
15.0000 mg | Freq: Four times a day (QID) | INTRAMUSCULAR | Status: DC | PRN
  Administered 2020-10-21 (×3): 15 mg via INTRAVENOUS
  Filled 2020-10-21 (×3): qty 1

## 2020-10-21 MED ORDER — ENOXAPARIN SODIUM 40 MG/0.4ML ~~LOC~~ SOLN
40.0000 mg | SUBCUTANEOUS | Status: DC
  Administered 2020-10-21: 40 mg via SUBCUTANEOUS
  Filled 2020-10-21: qty 0.4

## 2020-10-21 MED ORDER — ONDANSETRON HCL 4 MG/2ML IJ SOLN: 4.0000 mg | Freq: Four times a day (QID) | INTRAMUSCULAR | Status: DC | PRN

## 2020-10-21 MED ORDER — PROCHLORPERAZINE EDISYLATE 10 MG/2ML IJ SOLN
10.0000 mg | Freq: Four times a day (QID) | INTRAMUSCULAR | Status: DC | PRN
  Administered 2020-10-21 – 2020-10-22 (×4): 10 mg via INTRAVENOUS
  Filled 2020-10-21 (×4): qty 2

## 2020-10-21 MED ORDER — METOCLOPRAMIDE HCL 5 MG/ML IJ SOLN
10.0000 mg | Freq: Three times a day (TID) | INTRAMUSCULAR | Status: DC
  Administered 2020-10-21 – 2020-10-23 (×7): 10 mg via INTRAVENOUS
  Filled 2020-10-21 (×7): qty 2

## 2020-10-21 MED ORDER — HYDROMORPHONE HCL 1 MG/ML IJ SOLN
1.0000 mg | Freq: Once | INTRAMUSCULAR | Status: AC
Start: 2020-10-21 — End: 2020-10-21
  Administered 2020-10-21: 1 mg via INTRAMUSCULAR
  Filled 2020-10-21: qty 1

## 2020-10-21 MED ORDER — HYDROCODONE-ACETAMINOPHEN 5-325 MG PO TABS
1.0000 | ORAL_TABLET | ORAL | Status: DC | PRN
Start: 2020-10-21 — End: 2020-10-23
  Administered 2020-10-21 – 2020-10-23 (×5): 1 via ORAL
  Filled 2020-10-21 (×5): qty 1

## 2020-10-21 NOTE — ED Notes (Signed)
Report to the floor

## 2020-10-21 NOTE — Progress Notes (Signed)
  PROGRESS NOTE  Patient admitted earlier this morning. See H&P.   Mauria Asquith is a 30 y.o. female with medical history significant for Roux-en-Y bypass in 2017, history of cholecystectomy, marijuana use, and recurrent bouts of nausea and vomiting, now presenting to the emergency department with recurrent nausea, vomiting, and upper abdominal discomfort.  Patient was admitted to the hospital from 2/23 until 10/18/2020 with the same symptoms, improved with antiemetics and was tolerating diet at time of discharge but reports that symptoms returned shortly after.  Reports that she has been unable to keep anything down since returning home.  She reports that the symptoms are the same as what she has been seen in the ED multiple times for an what she was hospitalized in [facility] with in late December and again last week.  She had a CT the abdomen pelvis with no acute findings within the past week, had upper GI series with SBFT in [place] recently with no acute abnormality, and underwent diagnostic laparoscopy a year ago for similar symptoms which was largely unremarkable.  Her bypass surgery was performed in March 2017 at [facility] by Dr. Thedore Mins.  On examination, patient is resting in bed, eating a popsicle.  She states that she is afraid to advance her diet due to intractable nausea and vomiting.  Also admits to severe epigastric abdominal pain that once it starts, does not let up, is crampy in nature associated with nausea and vomiting.  Question if patient's symptoms are secondary to gastroparesis?  She is on trial of IV Reglan.  Continue antiemetic, supportive care.  Advance to full liquid diet and continue to monitor.   Status is: Observation  The patient will require care spanning > 2 midnights and should be moved to inpatient because: IV treatments appropriate due to intensity of illness or inability to take PO  Dispo: The patient is from: Home              Anticipated d/c is to:  Home              Patient currently is not medically stable to d/c.   Difficult to place patient No       Noralee Stain, DO [facility] 10/21/2020, 12:37 PM  Available via Epic secure chat 7am-7pm After these hours, please refer to coverage provider listed on [email or web address]

## 2020-10-21 NOTE — H&P (Signed)
History and Physical    Marie Myers PYP:[id number] DOB: [birth year] DOA: 10/20/2020  PCP: [facility]   Patient coming from: Home   Chief Complaint: Upper abdominal discomfort, N/V   HPI: Marie Myers is a 30 y.o. female with medical history significant for Roux-en-Y bypass in 2017, history of cholecystectomy, marijuana use, and recurrent bouts of nausea and vomiting, now presenting to the emergency department with recurrent nausea, vomiting, and upper abdominal discomfort.  Patient was admitted to the hospital from 2/23 until 10/18/2020 with the same symptoms, improved with antiemetics and was tolerating diet at time of discharge but reports that symptoms returned shortly after.  Reports that she has been unable to keep anything down since returning home.  She denies fevers, chills, dysuria, lower abdominal or suprapubic pain, or flank pain.  She reports that the symptoms are the same as what she has been seen in the ED multiple times for an what she was hospitalized in [facility] with in late December and again last week.  She had a CT the abdomen pelvis with no acute findings within the past week, had upper GI series with SBFT in [place] recently with no acute abnormality, and underwent diagnostic laparoscopy a year ago for similar symptoms which was largely unremarkable.  Her bypass surgery was performed in March 2017 at [facility] by Dr. Thedore Mins.  ED Course: Upon arrival to the ED, patient is found to be afebrile, saturating well on room air, tachycardic in the 110s, and hypertensive.  EKG features sinus tachycardia with rate 105, PVC, and normal QT interval.  Chemistry panel notable for bicarbonate of 19, anion gap 19, and total bilirubin 1.4.  CBC features a leukocytosis to 13,400 and a macrocytosis without anemia.  Urinalysis notable for ketonuria.  Patient was given IV fluids, analgesics, and antiemetics in the emergency department.  COVID-19 screening test not yet  resulted.  Review of Systems:  All other systems reviewed and apart from HPI, are negative.  History reviewed. No pertinent past medical history.  Past Surgical History:  Procedure Laterality Date  . CHOLECYSTECTOMY    . GASTRIC BYPASS    . LAPAROSCOPY N/A 08/11/2019   Procedure: LAPAROSCOPY DIAGNOSTIC;  Surgeon: Harriette Bouillon, MD;  Location: [facility] OR;  Service: General;  Laterality: N/A;    Social History:   reports that she has never smoked. She has never used smokeless tobacco. She reports current alcohol use. She reports that she does not use drugs.  Allergies  Allergen Reactions  . Zithromax [Azithromycin] Hives    History reviewed. No pertinent family history.   Prior to Admission medications   Medication Sig Start Date End Date Taking? Authorizing Provider  acetaminophen (TYLENOL) 500 MG tablet Take 1,500 mg by mouth every 6 (six) hours as needed for moderate pain.    [provider]  amitriptyline (ELAVIL) 100 MG tablet Take 100 mg by mouth at bedtime as needed for sleep. 10/14/20   [provider]  cephALEXin (KEFLEX) 500 MG capsule Take 1 capsule (500 mg total) by mouth every 12 (twelve) hours. 10/19/20   Kathlen Mody, MD  dicyclomine (BENTYL) 20 MG tablet Take 20 mg by mouth in the morning and at bedtime.    [provider]  diphenhydrAMINE (BENADRYL) 25 MG tablet Take 25 mg by mouth every 6 (six) hours as needed for itching or allergies.    [provider]  omeprazole (PRILOSEC) 20 MG capsule Take 40 mg by mouth daily.    [provider]  promethazine (PHENERGAN) 25 MG tablet Take 1 tablet (25 mg total) by mouth every 6 (six) hours as needed for nausea or vomiting. 06/26/20   Mare Ferrari, PA-C  traMADol (ULTRAM) 50 MG tablet Take 50 mg by mouth every 6 (six) hours as needed for moderate pain. 09/17/20   [provider]    Physical Exam: Vitals:   10/20/20 2214 10/20/20 2230 10/21/20 0137 10/21/20 0200  BP: (!)  207/114 (!) 179/113 (!) 163/110 (!) 153/99  Pulse: 89 88 (!) 119 100  Resp: (!) 21  20 16   Temp: 98.7 F (37.1 C)     TempSrc: Oral     SpO2: 99% 100% 100% 100%  Weight: 80.3 kg     Height: 5\' 4"  (1.626 m)       Constitutional: NAD, calm  Eyes: PERTLA, lids and conjunctivae normal ENMT: Mucous membranes are moist. Posterior pharynx clear of any exudate or lesions.   Neck: normal, supple, no masses, no thyromegaly Respiratory:  no wheezing, no crackles. No accessory muscle use.  Cardiovascular: S1 & S2 heard, regular rate and rhythm. No extremity edema.  Abdomen: No distension, soft, no rebound pain or guarding. Bowel sounds active.  Musculoskeletal: no clubbing / cyanosis. No joint deformity upper and lower extremities.   Skin: no significant rashes, lesions, ulcers. Warm, dry, well-perfused. Neurologic: CN 2-12 grossly intact. Sensation intact. Moving all extremities.  Psychiatric: Alert and oriented to person, place, and situation. Calm and cooperative.    Labs and Imaging on Admission: I have personally reviewed following labs and imaging studies  CBC: Recent Labs  Lab 10/15/20 1822 10/16/20 1750 10/17/20 0422 10/21/20 0145  WBC 12.3* 13.3* 10.0 13.4*  NEUTROABS  --  10.3*  --  11.3*  HGB 15.4* 15.4* 13.2 14.3  HCT 45.9 43.9 39.2 41.4  MCV 106.0* 103.3* 107.4* 103.5*  PLT 317 313 250 315   Basic Metabolic Panel: Recent Labs  Lab 10/15/20 1822 10/16/20 1750 10/17/20 0422 10/18/20 0916 10/21/20 0155  NA 141 133* 137 139 135  K 3.9 3.7 3.2* 3.7 3.5  CL 104 98 105 108 97*  CO2 23 21* 22 22 19*  GLUCOSE 94 115* 102* 86 101*  BUN 10 9 11 9 6   CREATININE 0.59 0.65 0.64 0.54 0.49  CALCIUM 9.9 9.2 8.5* 8.5* 9.7   GFR: Estimated Creatinine Clearance: 106.3 mL/min (by C-G formula based on SCr of 0.49 mg/dL). Liver Function Tests: Recent Labs  Lab 10/15/20 1822 10/16/20 1750 10/17/20 0422 10/21/20 0155  AST 21 22 14* 23  ALT 15 13 11 16   ALKPHOS 70 66 52 71   BILITOT 0.9 1.8* 1.4* 1.4*  PROT 8.9* 8.3* 6.5 8.5*  ALBUMIN 5.2* 4.8 3.8 4.7   Recent Labs  Lab 10/15/20 1822 10/16/20 1750 10/21/20 0155  LIPASE 27 25 24    No results for input(s): AMMONIA in the last 168 hours. Coagulation Profile: No results for input(s): INR, PROTIME in the last 168 hours. Cardiac Enzymes: No results for input(s): CKTOTAL, CKMB, CKMBINDEX, TROPONINI in the last 168 hours. BNP (last 3 results) No results for input(s): PROBNP in the last 8760 hours. HbA1C: No results for input(s): HGBA1C in the last 72 hours. CBG: No results for input(s): GLUCAP in the last 168 hours. Lipid Profile: No results for input(s): CHOL, HDL, LDLCALC, TRIG, CHOLHDL, LDLDIRECT in the last 72 hours. Thyroid Function Tests: No results for input(s): TSH, T4TOTAL, FREET4, T3FREE, THYROIDAB in the last 72 hours. Anemia Panel: No  results for input(s): VITAMINB12, FOLATE, FERRITIN, TIBC, IRON, RETICCTPCT in the last 72 hours. Urine analysis:    Component Value Date/Time   COLORURINE YELLOW 10/20/2020 2236   APPEARANCEUR CLEAR 10/20/2020 2236   LABSPEC 1.017 10/20/2020 2236   PHURINE 5.0 10/20/2020 2236   GLUCOSEU NEGATIVE 10/20/2020 2236   HGBUR SMALL (A) 10/20/2020 2236   BILIRUBINUR NEGATIVE 10/20/2020 2236   KETONESUR 80 (A) 10/20/2020 2236   PROTEINUR 30 (A) 10/20/2020 2236   NITRITE NEGATIVE 10/20/2020 2236   LEUKOCYTESUR NEGATIVE 10/20/2020 2236   Sepsis Labs: @LABRCNTIP (procalcitonin:4,lacticidven:4) ) Recent Results (from the past 240 hour(s))  SARS CORONAVIRUS 2 (TAT 6-24 HRS) Nasopharyngeal Nasopharyngeal Swab     Status: None   Collection Time: 10/16/20 11:44 PM   Specimen: Nasopharyngeal Swab  Result Value Ref Range Status   SARS Coronavirus 2 NEGATIVE NEGATIVE Final    Comment: (NOTE) SARS-CoV-2 target nucleic acids are NOT DETECTED.  The SARS-CoV-2 RNA is generally detectable in upper and lower respiratory specimens during the acute phase of infection.  Negative results do not preclude SARS-CoV-2 infection, do not rule out co-infections with other pathogens, and should not be used as the sole basis for treatment or other patient management decisions. Negative results must be combined with clinical observations, patient history, and epidemiological information. The expected result is Negative.  Fact Sheet for Patients: 10/18/20  Fact Sheet for Healthcare Providers: HairSlick.no  This test is not yet approved or cleared by the quierodirigir.com FDA and  has been authorized for detection and/or diagnosis of SARS-CoV-2 by FDA under an Emergency Use Authorization (EUA). This EUA will remain  in effect (meaning this test can be used) for the duration of the COVID-19 declaration under Se ction 564(b)(1) of the Act, 21 U.S.C. section 360bbb-3(b)(1), unless the authorization is terminated or revoked sooner.  Performed at [facility], [address] [place], [place]      Radiological Exams on Admission: No results found.  EKG: Independently reviewed. Sinus tachycardia, rate 105, PVC, QTc 438.   Assessment/Plan  1. Intractable N/V  - Presents with recurrent N/V that returned shortly after hospital discharge  - Exam is benign, CT a few days ago with no acute findings, possible related to cannabis  - She reports improvement with antiemetics in ED but continues to have nausea and is scared to try PO intake  - Continue IVF hydration, monitor electrolytes, schedule Reglan, continue as-needed Zofran    2. Metabolic acidosis  - Serum bicarbonate is 19 and AG 19 on admission  - Likely starvation ketoacidosis  - Continue IVF hydration and antiemetics, advance diet as she improves   3. SIRS  - Tachycardia and leukocytosis noted on admission  - No fever, no urinary sxs, suspect this is reactive will monitor off of antibiotics, culture if febrile    4. Hypertensive  urgency  - BP as high as 207/114 in ED, improved to 150/100 with analgesia and antiemetics  - Continue to treat pain and N/V, use labetalol if needed     DVT prophylaxis: Lovenox  Code Status: Full  Level of Care: Level of care: Telemetry Family Communication: None present  Disposition Plan:  Patient is from: Home  Anticipated d/c is to: Home  Anticipated d/c date is: Possibly as early as 10/22/20 Patient currently: Pending tolerance of diet  Consults called: none  Admission status: Observation     12/22/20, MD [facility]  10/21/2020, 5:30 AM

## 2020-10-22 DIAGNOSIS — R1013 Epigastric pain: Secondary | ICD-10-CM | POA: Diagnosis present

## 2020-10-22 DIAGNOSIS — E872 Acidosis: Secondary | ICD-10-CM | POA: Diagnosis present

## 2020-10-22 DIAGNOSIS — R824 Acetonuria: Secondary | ICD-10-CM | POA: Diagnosis present

## 2020-10-22 DIAGNOSIS — I160 Hypertensive urgency: Secondary | ICD-10-CM | POA: Diagnosis present

## 2020-10-22 DIAGNOSIS — Z9884 Bariatric surgery status: Secondary | ICD-10-CM | POA: Diagnosis not present

## 2020-10-22 DIAGNOSIS — R112 Nausea with vomiting, unspecified: Secondary | ICD-10-CM | POA: Diagnosis present

## 2020-10-22 DIAGNOSIS — Z20822 Contact with and (suspected) exposure to covid-19: Secondary | ICD-10-CM | POA: Diagnosis present

## 2020-10-22 DIAGNOSIS — R6510 Systemic inflammatory response syndrome (SIRS) of non-infectious origin without acute organ dysfunction: Secondary | ICD-10-CM | POA: Diagnosis present

## 2020-10-22 LAB — COMPREHENSIVE METABOLIC PANEL
ALT: 10 U/L (ref 0–44)
AST: 20 U/L (ref 15–41)
Albumin: 2.9 g/dL — ABNORMAL LOW (ref 3.5–5.0)
Alkaline Phosphatase: 44 U/L (ref 38–126)
Anion gap: 8 (ref 5–15)
BUN: 7 mg/dL (ref 6–20)
CO2: 22 mmol/L (ref 22–32)
Calcium: 8.1 mg/dL — ABNORMAL LOW (ref 8.9–10.3)
Chloride: 108 mmol/L (ref 98–111)
Creatinine, Ser: 0.51 mg/dL (ref 0.44–1.00)
GFR, Estimated: >60 mL/min (ref >60)
Glucose, Bld: 77 mg/dL (ref 70–99)
Potassium: 3.7 mmol/L (ref 3.5–5.1)
Sodium: 138 mmol/L (ref 135–145)
Total Bilirubin: 0.9 mg/dL (ref 0.3–1.2)
Total Protein: 5.1 g/dL — ABNORMAL LOW (ref 6.5–8.1)

## 2020-10-22 LAB — CBC WITH DIFFERENTIAL/PLATELET
Abs Immature Granulocytes: 0.02 10*3/uL (ref 0.00–0.07)
Basophils Absolute: 0 10*3/uL (ref 0.0–0.1)
Basophils Relative: 1 %
Eosinophils Absolute: 0.1 10*3/uL (ref 0.0–0.5)
Eosinophils Relative: 2 %
HCT: 32.5 % — ABNORMAL LOW (ref 36.0–46.0)
Hemoglobin: 11.4 g/dL — ABNORMAL LOW (ref 12.0–15.0)
Immature Granulocytes: 0 %
Lymphocytes Relative: 43 %
Lymphs Abs: 2.6 10*3/uL (ref 0.7–4.0)
MCH: 36.5 pg — ABNORMAL HIGH (ref 26.0–34.0)
MCHC: 35.1 g/dL (ref 30.0–36.0)
MCV: 104.2 fL — ABNORMAL HIGH (ref 80.0–100.0)
Monocytes Absolute: 0.7 10*3/uL (ref 0.1–1.0)
Monocytes Relative: 11 %
Neutro Abs: 2.7 10*3/uL (ref 1.7–7.7)
Neutrophils Relative %: 43 %
Platelets: 35 10*3/uL — ABNORMAL LOW (ref 150–400)
RBC: 3.12 MIL/uL — ABNORMAL LOW (ref 3.87–5.11)
RDW: 11.1 % — ABNORMAL LOW (ref 11.5–15.5)
WBC: 6.1 10*3/uL (ref 4.0–10.5)
nRBC: 0 % (ref 0.0–0.2)

## 2020-10-22 LAB — CBC
HCT: 32.2 % — ABNORMAL LOW (ref 36.0–46.0)
Hemoglobin: 10.9 g/dL — ABNORMAL LOW (ref 12.0–15.0)
MCH: 36.1 pg — ABNORMAL HIGH (ref 26.0–34.0)
MCHC: 33.9 g/dL (ref 30.0–36.0)
MCV: 106.6 fL — ABNORMAL HIGH (ref 80.0–100.0)
Platelets: 231 10*3/uL (ref 150–400)
RBC: 3.02 MIL/uL — ABNORMAL LOW (ref 3.87–5.11)
RDW: 11.1 % — ABNORMAL LOW (ref 11.5–15.5)
WBC: 7.7 10*3/uL (ref 4.0–10.5)
nRBC: 0 % (ref 0.0–0.2)

## 2020-10-22 LAB — IMMATURE PLATELET FRACTION: Immature Platelet Fraction: 1.9 % (ref 1.2–8.6)

## 2020-10-22 MED ORDER — ENOXAPARIN SODIUM 40 MG/0.4ML ~~LOC~~ SOLN
40.0000 mg | SUBCUTANEOUS | Status: DC
  Administered 2020-10-22: 40 mg via SUBCUTANEOUS
  Filled 2020-10-22: qty 0.4

## 2020-10-22 NOTE — Progress Notes (Signed)
PROGRESS NOTE    Marie Myers  YQM:[id number] DOB: [birth year] DOA: 10/20/2020 [facility]: [facility]     Brief Narrative:  Marie Myers a 30 y.o.femalewith medical history significant forRoux-en-Y bypass in 2017, history of cholecystectomy, marijuana use, and recurrent bouts of nausea and vomiting, now presenting to the emergency department with recurrent nausea, vomiting, and upper abdominal discomfort. Patient was admitted to the hospital from 2/23 until 10/18/2020 with the same symptoms, improved with antiemetics and was tolerating diet at time of discharge but reports that symptoms returned shortly after. Reports that she has been unable to keep anything down since returning home. She reports that the symptoms are the same as what she has been seen in the ED multiple times for an what she was hospitalized in [facility] with in late December and again last week. She had a CT the abdomen pelvis with no acute findings within the past week, had upper GI series with SBFT in [place] recently with no acute abnormality, and underwent diagnostic laparoscopy a year ago for similar symptoms which was largely unremarkable. Her bypasssurgery was performed in March [facility] at [facility] by Dr. Thedore Mins. Patient was admitted for intractable nausea, vomiting and abdominal pain.   New events last 24 hours / Subjective: States that Reglan has been helping. Has been able to tolerate clear liquids, a bite of pudding but has not really advance diet much. No further nausea or vomiting. Discussed trial of advancing diet today  Assessment & Plan:   Principal Problem:   Intractable nausea and vomiting Active Problems:   Hypertensive urgency   SIRS (systemic inflammatory response syndrome) (HCC)   Metabolic acidemia   Intractable nausea, vomiting -Possibly secondary to gastroparesis, cyclical vomiting syndrome with marijuana use -IV Reglan has been helping -Advance diet today, stop IV  fluid -Patient very wary of advancing diet and discharging as this is her readmission with same issue.  SIRS -At time of admission presented with tachycardia and leukocytosis.  No signs of infection found.  Sepsis ruled out  Hypertensive urgency -Resolved    DVT prophylaxis: Lovenox    Code Status: Full code Family Communication: No family at bedside Disposition Plan:  Status is: Observation  The patient will require care spanning > 2 midnights and should be moved to inpatient because: IV treatments appropriate due to intensity of illness or inability to take PO  Dispo: The patient is from: Home              Anticipated d/c is to: Home              Patient currently is not medically stable to d/c. Continue IV Reglan and advance diet as tolerated   Difficult to place patient No    Antimicrobials:  Anti-infectives (From admission, onward)   None        Objective: Vitals:   10/21/20 0606 10/21/20 1324 10/21/20 2116 10/22/20 0500  BP:  (!) 139/94 111/73 105/64  Pulse:  97 71 78  Resp:   20 20  Temp:  98.1 F (36.7 C) 98.2 F (36.8 C) 97.9 F (36.6 C)  TempSrc:  Oral Oral Oral  SpO2:  98% 99% 99%  Weight: 79.7 kg   79.5 kg  Height: 5\' 4"  (1.626 m)       Intake/Output Summary (Last 24 hours) at 10/22/2020 1149 Last data filed at 10/22/2020 0901 Gross per 24 hour  Intake 2392.65 ml  Output --  Net 2392.65 ml   12/22/2020  10/21/20 0545 10/21/20 0606 10/22/20 0500  Weight: 80.4 kg 79.7 kg 79.5 kg    Examination:  General exam: Appears calm and comfortable  Respiratory system: Clear to auscultation. Respiratory effort normal. No respiratory distress. No conversational dyspnea.  Cardiovascular system: S1 & S2 heard, RRR. No murmurs. No pedal edema. Gastrointestinal system: Abdomen is nondistended, soft and nontender. Normal bowel sounds heard. Central nervous system: Alert and oriented. No focal neurological deficits. Speech clear.  Extremities: Symmetric  in appearance  Skin: No rashes, lesions or ulcers on exposed skin  Psychiatry: Judgement and insight appear normal. Mood & affect appropriate.   Data Reviewed: I have personally reviewed following labs and imaging studies  CBC: Recent Labs  Lab 10/16/20 1750 10/17/20 0422 10/21/20 0145 10/22/20 0348 10/22/20 0746  WBC 13.3* 10.0 13.4* 6.1 7.7  NEUTROABS 10.3*  --  11.3* 2.7  --   HGB 15.4* 13.2 14.3 11.4* 10.9*  HCT 43.9 39.2 41.4 32.5* 32.2*  MCV 103.3* 107.4* 103.5* 104.2* 106.6*  PLT 313 250 315 35* 231   Basic Metabolic Panel: Recent Labs  Lab 10/16/20 1750 10/17/20 0422 10/18/20 0916 10/21/20 0155 10/22/20 0348  NA 133* 137 139 135 138  K 3.7 3.2* 3.7 3.5 3.7  CL 98 105 108 97* 108  CO2 21* 22 22 19* 22  GLUCOSE 115* 102* 86 101* 77  BUN 9 11 9 6 7   CREATININE 0.65 0.64 0.54 0.49 0.51  CALCIUM 9.2 8.5* 8.5* 9.7 8.1*   GFR: Estimated Creatinine Clearance: 105.8 mL/min (by C-G formula based on SCr of 0.51 mg/dL). Liver Function Tests: Recent Labs  Lab 10/15/20 1822 10/16/20 1750 10/17/20 0422 10/21/20 0155 10/22/20 0348  AST 21 22 14* 23 20  ALT 15 13 11 16 10   ALKPHOS 70 66 52 71 44  BILITOT 0.9 1.8* 1.4* 1.4* 0.9  PROT 8.9* 8.3* 6.5 8.5* 5.1*  ALBUMIN 5.2* 4.8 3.8 4.7 2.9*   Recent Labs  Lab 10/15/20 1822 10/16/20 1750 10/21/20 0155  LIPASE 27 25 24    No results for input(s): AMMONIA in the last 168 hours. Coagulation Profile: No results for input(s): INR, PROTIME in the last 168 hours. Cardiac Enzymes: No results for input(s): CKTOTAL, CKMB, CKMBINDEX, TROPONINI in the last 168 hours. BNP (last 3 results) No results for input(s): PROBNP in the last 8760 hours. HbA1C: No results for input(s): HGBA1C in the last 72 hours. CBG: No results for input(s): GLUCAP in the last 168 hours. Lipid Profile: No results for input(s): CHOL, HDL, LDLCALC, TRIG, CHOLHDL, LDLDIRECT in the last 72 hours. Thyroid Function Tests: No results for input(s):  TSH, T4TOTAL, FREET4, T3FREE, THYROIDAB in the last 72 hours. Anemia Panel: No results for input(s): VITAMINB12, FOLATE, FERRITIN, TIBC, IRON, RETICCTPCT in the last 72 hours. Sepsis Labs: Recent Labs  Lab 10/21/20 0155  LATICACIDVEN 1.8    Recent Results (from the past 240 hour(s))  SARS CORONAVIRUS 2 (TAT 6-24 HRS) Nasopharyngeal Nasopharyngeal Swab     Status: None   Collection Time: 10/16/20 11:44 PM   Specimen: Nasopharyngeal Swab  Result Value Ref Range Status   SARS Coronavirus 2 NEGATIVE NEGATIVE Final    Comment: (NOTE) SARS-CoV-2 target nucleic acids are NOT DETECTED.  The SARS-CoV-2 RNA is generally detectable in upper and lower respiratory specimens during the acute phase of infection. Negative results do not preclude SARS-CoV-2 infection, do not rule out co-infections with other pathogens, and should not be used as the sole basis for treatment or other patient management decisions.  Negative results must be combined with clinical observations, patient history, and epidemiological information. The expected result is Negative.  Fact Sheet for Patients: HairSlick.no  Fact Sheet for Healthcare Providers: quierodirigir.com  This test is not yet approved or cleared by the [place] FDA and  has been authorized for detection and/or diagnosis of SARS-CoV-2 by FDA under an Emergency Use Authorization (EUA). This EUA will remain  in effect (meaning this test can be used) for the duration of the COVID-19 declaration under Se ction 564(b)(1) of the Act, 21 U.S.C. section 360bbb-3(b)(1), unless the authorization is terminated or revoked sooner.  Performed at [facility], [address], [place], [place] [address]   Resp Panel by RT-PCR (Flu A&B, Covid) Nasopharyngeal Swab     Status: None   Collection Time: 10/21/20  4:34 AM   Specimen: Nasopharyngeal Swab; Nasopharyngeal(NP) swabs in vial transport medium   Result Value Ref Range Status   SARS Coronavirus 2 by RT PCR NEGATIVE NEGATIVE Final    Comment: (NOTE) SARS-CoV-2 target nucleic acids are NOT DETECTED.  The SARS-CoV-2 RNA is generally detectable in upper respiratory specimens during the acute phase of infection. The lowest concentration of SARS-CoV-2 viral copies this assay can detect is 138 copies/mL. A negative result does not preclude SARS-Cov-2 infection and should not be used as the sole basis for treatment or other patient management decisions. A negative result may occur with  improper specimen collection/handling, submission of specimen other than nasopharyngeal swab, presence of viral mutation(s) within the areas targeted by this assay, and inadequate number of viral copies(<138 copies/mL). A negative result must be combined with clinical observations, patient history, and epidemiological information. The expected result is Negative.  Fact Sheet for Patients:  BloggerCourse.com  Fact Sheet for Healthcare Providers:  SeriousBroker.it  This test is no t yet approved or cleared by the [place] FDA and  has been authorized for detection and/or diagnosis of SARS-CoV-2 by FDA under an Emergency Use Authorization (EUA). This EUA will remain  in effect (meaning this test can be used) for the duration of the COVID-19 declaration under Section 564(b)(1) of the Act, 21 U.S.C.section 360bbb-3(b)(1), unless the authorization is terminated  or revoked sooner.       Influenza A by PCR NEGATIVE NEGATIVE Final   Influenza B by PCR NEGATIVE NEGATIVE Final    Comment: (NOTE) The Xpert Xpress SARS-CoV-2/FLU/RSV plus assay is intended as an aid in the diagnosis of influenza from Nasopharyngeal swab specimens and should not be used as a sole basis for treatment. Nasal washings and aspirates are unacceptable for Xpert Xpress SARS-CoV-2/FLU/RSV testing.  Fact Sheet for  Patients: BloggerCourse.com  Fact Sheet for Healthcare Providers: SeriousBroker.it  This test is not yet approved or cleared by the [place] FDA and has been authorized for detection and/or diagnosis of SARS-CoV-2 by FDA under an Emergency Use Authorization (EUA). This EUA will remain in effect (meaning this test can be used) for the duration of the COVID-19 declaration under Section 564(b)(1) of the Act, 21 U.S.C. section 360bbb-3(b)(1), unless the authorization is terminated or revoked.  Performed at [facility], [address] [place], [place] [address]       Radiology Studies: No results found.    Scheduled Meds: . metoCLOPramide (REGLAN) injection  10 mg Intravenous Q8H   Continuous Infusions: . sodium chloride 100 mL/hr at 10/22/20 0403     LOS: 0 days      Time spent: 25 minutes   Victorino Dike  Alvino Chapelhoi, DO [facility] 10/22/2020, 11:49 AM   Available via Epic secure chat 7am-7pm After these hours, please refer to coverage provider listed on [email or web address]

## 2020-10-23 DIAGNOSIS — I160 Hypertensive urgency: Secondary | ICD-10-CM | POA: Diagnosis not present

## 2020-10-23 DIAGNOSIS — E872 Acidosis: Secondary | ICD-10-CM | POA: Diagnosis not present

## 2020-10-23 DIAGNOSIS — R112 Nausea with vomiting, unspecified: Secondary | ICD-10-CM | POA: Diagnosis not present

## 2020-10-23 MED ORDER — ACETAMINOPHEN 500 MG PO TABS: 1000.0000 mg | ORAL_TABLET | Freq: Three times a day (TID) | ORAL | 0 refills | Status: DC | PRN

## 2020-10-23 MED ORDER — METOCLOPRAMIDE HCL 10 MG PO TABS: 5.0000 mg | ORAL_TABLET | Freq: Three times a day (TID) | ORAL | 0 refills | Status: DC | PRN

## 2020-10-23 NOTE — Discharge Summary (Signed)
Physician Discharge Summary  Marie LevinsOceana Myers WUJ:[id number] DOB: March 08, 1991 DOA: 10/20/2020  PCP: [facility]  Admit date: 10/20/2020 Discharge date: 10/23/2020  Admitted From: Home Disposition: Home  Recommendations for Outpatient Follow-up:  1. Follow up with PCP in 1 week 2. Establish care with general surgery in this area 3. Please follow up on the following pending results: None  Home Health: None Equipment/Devices: None  Discharge Condition: Stable CODE STATUS: Full code Diet recommendation: Bariatric diet   Brief/Interim Summary:  Admission HPI written by Noralee StainJennifer Choi, DO   Chief Complaint: Upper abdominal discomfort, N/V   HPI: Marie Myers is a 30 y.o. female with medical history significant for Roux-en-Y bypass in 2017, history of cholecystectomy, marijuana use, and recurrent bouts of nausea and vomiting, now presenting to the emergency department with recurrent nausea, vomiting, and upper abdominal discomfort.  Patient was admitted to the hospital from 2/23 until 10/18/2020 with the same symptoms, improved with antiemetics and was tolerating diet at time of discharge but reports that symptoms returned shortly after.  Reports that she has been unable to keep anything down since returning home.  She denies fevers, chills, dysuria, lower abdominal or suprapubic pain, or flank pain.  She reports that the symptoms are the same as what she has been seen in the ED multiple times for an what [place] [place] [place] in late December and again last week.  She had a CT the abdomen pelvis with no acute findings within the past week, had upper GI series with SB[facility] in [place] recently with no acute abnormality, and underwent diagnostic laparoscopy a year ago for similar symptoms which was largely unremarkable.  Her bypass surgery was performed in March 2017 at [facility] by Dr. Thedore MinsSingh.    Hospital course:  Intractable  nausea/vomiting Associated tachycardia and leukocytosis with no infectious source identified. In setting of gastric bypass history. No diabetes history. Patient managed with Reglan IV which has helped of symptoms. Diet advanced slowly. Patient tolerated solid diet prior to discharge. Reglan prescription provided on discharge. Recommendation for general surgery establishment and follow-up.  Hypertensive urgency Unsure of etiology. Resolved without treatment.  Metabolic acidosis Mild and secondary to nausea/vomiting. Resolved.  Discharge Diagnoses:  Principal Problem:   Intractable nausea and vomiting Active Problems:   Hypertensive urgency   Metabolic acidemia    Discharge Instructions  Discharge Instructions    Call MD for:  severe uncontrolled pain   Complete by: As directed    Diet - low sodium heart healthy   Complete by: As directed    Increase activity slowly   Complete by: As directed    No wound care   Complete by: As directed      Allergies as of 10/23/2020      Reactions   Zithromax [azithromycin] Hives      Medication List    STOP taking these medications   cephALEXin 500 MG capsule Commonly known as: KEFLEX   promethazine 25 MG tablet Commonly known as: PHENERGAN     TAKE these medications   acetaminophen 500 MG tablet Commonly known as: TYLENOL Take 2 tablets (1,000 mg total) by mouth every 8 (eight) hours as needed for moderate pain. What changed:   how much to take  when to take this   amitriptyline 100 MG tablet Commonly known as: ELAVIL Take 100 mg by mouth at bedtime as needed for sleep.   dicyclomine 20 MG tablet Commonly known as: BENTYL Take  20 mg by mouth in the morning and at bedtime.   diphenhydrAMINE 25 MG tablet Commonly known as: BENADRYL Take 25 mg by mouth every 6 (six) hours as needed for itching or allergies.   metoCLOPramide 10 MG tablet Commonly known as: Reglan Take 0.5-1 tablets (5-10 mg total) by mouth every 8  (eight) hours as needed for nausea.   omeprazole 20 MG capsule Commonly known as: PRILOSEC Take 40 mg by mouth daily.       Follow-up Information    [facility]. Schedule an appointment as soon as possible for a visit in 1 week(s).   Why: Hospital follow-up Contact information: [address] [place] [address] [phone]        [facility], [place]. Schedule an appointment as soon as possible for a visit in 2 week(s).   Specialty: General Surgery Why: Establish care Contact information: [address] [place] [address] [phone]             Allergies  Allergen Reactions  . Zithromax [Azithromycin] Hives    Consultations:  None   Procedures/Studies: DG Chest 2 View  Result Date: 10/15/2020 CLINICAL DATA:  Chest pain. EXAM: CHEST - 2 VIEW COMPARISON:  June 26, 2020. FINDINGS: The heart size and mediastinal contours are within normal limits. Both lungs are clear. No pneumothorax or pleural effusion is noted. The visualized skeletal structures are unremarkable. IMPRESSION: No active cardiopulmonary disease. Electronically Signed   By: Lupita Raider M.D.   On: 10/15/2020 16:45   CT Angio Chest PE W and/or Wo Contrast  Result Date: 10/16/2020 CLINICAL DATA:  Abdominal pain and nausea and vomiting for 1 day, history of chest pain EXAM: CT ANGIOGRAPHY CHEST WITH CONTRAST TECHNIQUE: Multidetector CT imaging of the chest was performed using the standard protocol during bolus administration of intravenous contrast. Multiplanar CT image reconstructions and MIPs were obtained to evaluate the vascular anatomy. CONTRAST:  54mL OMNIPAQUE IOHEXOL 350 MG/ML SOLN COMPARISON:  10/15/2020 FINDINGS: Cardiovascular: This is a technically adequate evaluation of the pulmonary vasculature. No filling defects or pulmonary emboli. The heart is unremarkable without pericardial effusion. No evidence of thoracic aortic  aneurysm or dissection. Mediastinum/Nodes: No enlarged mediastinal, hilar, or axillary lymph nodes. Thyroid gland, trachea, and esophagus demonstrate no significant findings. Lungs/Pleura: No airspace disease, effusion, or pneumothorax. Central airways are patent. Upper Abdomen: Postsurgical changes from bariatric surgery. No acute process. Musculoskeletal: No acute or destructive bony lesions. Reconstructed images demonstrate no additional findings. Review of the MIP images confirms the above findings. IMPRESSION: 1. No evidence of pulmonary embolus. 2. No acute intrathoracic process. Electronically Signed   By: Sharlet Salina M.D.   On: 10/16/2020 21:50   CT ABDOMEN PELVIS W CONTRAST  Result Date: 10/15/2020 CLINICAL DATA:  Abdominal pain. EXAM: CT ABDOMEN AND PELVIS WITH CONTRAST TECHNIQUE: Multidetector CT imaging of the abdomen and pelvis was performed using the standard protocol following bolus administration of intravenous contrast. CONTRAST:  OMNIPAQUE IOHEXOL 300 MG/ML  SOLN COMPARISON:  August 19, 2020 FINDINGS: Lower chest: No acute abnormality. Hepatobiliary: No focal liver abnormality is seen. Status post cholecystectomy. No biliary dilatation. Pancreas: Unremarkable. No pancreatic ductal dilatation or surrounding inflammatory changes. Spleen: Normal in size without focal abnormality. Adrenals/Urinary Tract: Adrenal glands are unremarkable. Kidneys are normal, without renal calculi, focal lesion, or hydronephrosis. Bladder is unremarkable. Stomach/Bowel: There is a small hiatal hernia. Multiple surgical sutures are seen within the gastric region. Appendix appears normal. No evidence  of bowel wall thickening, distention, or inflammatory changes. Vascular/Lymphatic: No significant vascular findings are present. No enlarged abdominal or pelvic lymph nodes. Reproductive: Uterus and bilateral adnexa are unremarkable. Other: No abdominal wall hernia or abnormality. No abdominopelvic ascites.  Musculoskeletal: No acute or significant osseous findings. IMPRESSION: 1. Evidence of prior gastric bypass surgery. 2. Small hiatal hernia. 3. Evidence of prior cholecystectomy. Electronically Signed   By: Aram Candela M.D.   On: 10/15/2020 23:24      Subjective: Tolerating diet. No nausea/vomiting.  Discharge Exam: Vitals:   10/22/20 2035 10/23/20 0423  BP: 110/64 120/65  Pulse: 89 83  Resp: 20 20  Temp: 98.1 F (36.7 C) 98.1 F (36.7 C)  SpO2: 98% 97%   Vitals:   10/22/20 1359 10/22/20 2035 10/23/20 0423 10/23/20 0456  BP: 131/89 110/64 120/65   Pulse: 94 89 83   Resp: 19 20 20    Temp: 98 F (36.7 C) 98.1 F (36.7 C) 98.1 F (36.7 C)   TempSrc: Oral Oral Oral   SpO2: 99% 98% 97%   Weight:    79.3 kg  Height:        General: Pt is alert, awake, not in acute distress Cardiovascular: RRR, S1/S2 +, no rubs, no gallops Respiratory: CTA bilaterally, no wheezing, no rhonchi Abdominal: Soft, NT, ND, bowel sounds + Extremities: no edema, no cyanosis    The results of significant diagnostics from this hospitalization (including imaging, microbiology, ancillary and laboratory) are listed below for reference.     Microbiology: Recent Results (from the past 240 hour(s))  SARS CORONAVIRUS 2 (TAT 6-24 HRS) Nasopharyngeal Nasopharyngeal Swab     Status: None   Collection Time: 10/16/20 11:44 PM   Specimen: Nasopharyngeal Swab  Result Value Ref Range Status   SARS Coronavirus 2 NEGATIVE NEGATIVE Final    Comment: (NOTE) SARS-CoV-2 target nucleic acids are NOT DETECTED.  The SARS-CoV-2 RNA is generally detectable in upper and lower respiratory specimens during the acute phase of infection. Negative results do not preclude SARS-CoV-2 infection, do not rule out co-infections with other pathogens, and should not be used as the sole basis for treatment or other patient management decisions. Negative results must be combined with clinical observations, patient history,  and epidemiological information. The expected result is Negative.  Fact Sheet for Patients: 10/18/20  Fact Sheet for Healthcare Providers: HairSlick.no  This test is not yet approved or cleared by the qu[place]irigir.com FDA and  has been authorized for detection and/or diagnosis of SARS-CoV-2 by FDA under an Emergency Use Authorization (EUA). This EUA will remain  in effect (meaning this test can be used) for the duration of the COVID-19 declaration under Se ction 564(b)(1) of the Act, 21 U.S.C. section 360bbb-3(b)(1), unless the authorization is terminated or revoked sooner.  Performed at [facility], [address], [place], [place]   Resp Panel by RT-PCR (Flu A&B, Covid) Nasopharyngeal Swab     Status: None   Collection Time: 10/21/20  4:34 AM   Specimen: Nasopharyngeal Swab; Nasopharyngeal(NP) swabs in vial transport medium  Result Value Ref Range Status   SARS Coronavirus 2 by RT PCR NEGATIVE NEGATIVE Final    Comment: (NOTE) SARS-CoV-2 target nucleic acids are NOT DETECTED.  The SARS-CoV-2 RNA is generally detectable in upper respiratory specimens during the acute phase of infection. The lowest concentration of SARS-CoV-2 viral copies this assay can detect is 138 copies/mL. A negative result does not preclude SARS-Cov-2 infection and should not be used as  the sole basis for treatment or other patient management decisions. A negative result may occur with  improper specimen collection/handling, submission of specimen other than nasopharyngeal swab, presence of viral mutation(s) within the areas targeted by this assay, and inadequate number of viral copies(<138 copies/mL). A negative result must be combined with clinical observations, patient history, and epidemiological information. The expected result is Negative.  Fact Sheet for Patients:  BloggerCourse.com  Fact Sheet  for Healthcare Providers:  SeriousBroker.it  This test is no t yet approved or cleared by the [place] FDA and  has been authorized for detection and/or diagnosis of SARS-CoV-2 by FDA under an Emergency Use Authorization (EUA). This EUA will remain  in effect (meaning this test can be used) for the duration of the COVID-19 declaration under Section 564(b)(1) of the Act, 21 U.S.C.section 360bbb-3(b)(1), unless the authorization is terminated  or revoked sooner.       Influenza A by PCR NEGATIVE NEGATIVE Final   Influenza B by PCR NEGATIVE NEGATIVE Final    Comment: (NOTE) The Xpert Xpress SARS-CoV-2/FLU/RSV plus assay is intended as an aid in the diagnosis of influenza from Nasopharyngeal swab specimens and should not be used as a sole basis for treatment. Nasal washings and aspirates are unacceptable for Xpert Xpress SARS-CoV-2/FLU/RSV testing.  Fact Sheet for Patients: BloggerCourse.com  Fact Sheet for Healthcare Providers: SeriousBroker.it  This test is not yet approved or cleared by the [place] FDA and has been authorized for detection and/or diagnosis of SARS-CoV-2 by FDA under an Emergency Use Authorization (EUA). This EUA will remain in effect (meaning this test can be used) for the duration of the COVID-19 declaration under Section 564(b)(1) of the Act, 21 U.S.C. section 360bbb-3(b)(1), unless the authorization is terminated or revoked.  Performed at [facility], [address] [place], [place] [address]      Labs: BNP (last 3 results) No results for input(s): BNP in the last 8760 hours. Basic Metabolic Panel: Recent Labs  Lab 10/16/20 1750 10/17/20 0422 10/18/20 0916 10/21/20 0155 10/22/20 0348  NA 133* 137 139 135 138  K 3.7 3.2* 3.7 3.5 3.7  CL 98 105 108 97* 108  CO2 21* 22 22 19* 22  GLUCOSE 115* 102* 86 101* 77  BUN CREATININE  0.65 0.64 0.54 0.49 0.51  CALCIUM 9.2 8.5* 8.5* 9.7 8.1*   Liver Function Tests: Recent Labs  Lab 10/16/20 1750 10/17/20 0422 10/21/20 0155 10/22/20 0348  AST 22 14* 23 20  ALT ALKPHOS 66 52 71 44  BILITOT 1.8* 1.4* 1.4* 0.9  PROT 8.3* 6.5 8.5* 5.1*  ALBUMIN 4.8 3.8 4.7 2.9*   Recent Labs  Lab 10/16/20 1750 10/21/20 0155  LIPASE 25 24   No results for input(s): AMMONIA in the last 168 hours. CBC: Recent Labs  Lab 10/16/20 1750 10/17/20 0422 10/21/20 0145 10/22/20 0348 10/22/20 0746  WBC 13.3* 10.0 13.4* 6.1 7.7  NEUTROABS 10.3*  --  11.3* 2.7  --   HGB 15.4* 13.2 14.3 11.4* 10.9*  HCT 43.9 39.2 41.4 32.5* 32.2*  MCV 103.3* 107.4* 103.5* 104.2* 106.6*  PLT 313 250 315 35* 231   Cardiac Enzymes: No results for input(s): CKTOTAL, CKMB, CKMBINDEX, TROPONINI in the last 168 hours. BNP: Invalid input(s): POCBNP CBG: No results for input(s): GLUCAP in the last 168 hours. D-Dimer No results for input(s): DDIMER in the last 72 hours. Hgb A1c No results for input(s): HGBA1C  in the last 72 hours. Lipid Profile No results for input(s): CHOL, HDL, LDLCALC, TRIG, CHOLHDL, LDLDIRECT in the last 72 hours. Thyroid function studies No results for input(s): TSH, T4TOTAL, T3FREE, THYROIDAB in the last 72 hours.  Invalid input(s): FREET3 Anemia work up No results for input(s): VITAMINB12, FOLATE, FERRITIN, TIBC, IRON, RETICCTPCT in the last 72 hours. Urinalysis    Component Value Date/Time   COLORURINE YELLOW 10/20/2020 2236   APPEARANCEUR CLEAR 10/20/2020 2236   LABSPEC 1.017 10/20/2020 2236   PHURINE 5.0 10/20/2020 2236   GLUCOSEU NEGATIVE 10/20/2020 2236   HGBUR SMALL (A) 10/20/2020 2236   BILIRUBINUR NEGATIVE 10/20/2020 2236   KETONESUR 80 (A) 10/20/2020 2236   PROTEINUR 30 (A) 10/20/2020 2236   NITRITE NEGATIVE 10/20/2020 2236   LEUKOCYTESUR NEGATIVE 10/20/2020 2236   Sepsis Labs Invalid input(s): PROCALCITONIN,  WBC,   LACTICIDVEN Microbiology Recent Results (from the past 240 hour(s))  SARS CORONAVIRUS 2 (TAT 6-24 HRS) Nasopharyngeal Nasopharyngeal Swab     Status: None   Collection Time: 10/16/20 11:44 PM   Specimen: Nasopharyngeal Swab  Result Value Ref Range Status   SARS Coronavirus 2 NEGATIVE NEGATIVE Final    Comment: (NOTE) SARS-CoV-2 target nucleic acids are NOT DETECTED.  The SARS-CoV-2 RNA is generally detectable in upper and lower respiratory specimens during the acute phase of infection. Negative results do not preclude SARS-CoV-2 infection, do not rule out co-infections with other pathogens, and should not be used as the sole basis for treatment or other patient management decisions. Negative results must be combined with clinical observations, patient history, and epidemiological information. The expected result is Negative.  Fact Sheet for Patients: HairSlick.no  Fact Sheet for Healthcare Providers: quierodirigir.com  This test is not yet approved or cleared by the [place] FDA and  has been authorized for detection and/or diagnosis of SARS-CoV-2 by FDA under an Emergency Use Authorization (EUA). This EUA will remain  in effect (meaning this test can be used) for the duration of the COVID-19 declaration under Se ction 564(b)(1) of the Act, 21 U.S.C. section 360bbb-3(b)(1), unless the authorization is terminated or revoked sooner.  Performed at [facility], [address], [place], [place] [address]   Resp Panel by RT-PCR (Flu A&B, Covid) Nasopharyngeal Swab     Status: None   Collection Time: 10/21/20  4:34 AM   Specimen: Nasopharyngeal Swab; Nasopharyngeal(NP) swabs in vial transport medium  Result Value Ref Range Status   SARS Coronavirus 2 by RT PCR NEGATIVE NEGATIVE Final    Comment: (NOTE) SARS-CoV-2 target nucleic acids are NOT DETECTED.  The SARS-CoV-2 RNA is generally detectable in upper  respiratory specimens during the acute phase of infection. The lowest concentration of SARS-CoV-2 viral copies this assay can detect is 138 copies/mL. A negative result does not preclude SARS-Cov-2 infection and should not be used as the sole basis for treatment or other patient management decisions. A negative result may occur with  improper specimen collection/handling, submission of specimen other than nasopharyngeal swab, presence of viral mutation(s) within the areas targeted by this assay, and inadequate number of viral copies(<138 copies/mL). A negative result must be combined with clinical observations, patient history, and epidemiological information. The expected result is Negative.  Fact Sheet for Patients:  BloggerCourse.com  Fact Sheet for Healthcare Providers:  SeriousBroker.it  This test is no t yet approved or cleared by the [place] FDA and  has been authorized for detection and/or diagnosis of SARS-CoV-2 by FDA under an Emergency Use Authorization (  EUA). This EUA will remain  in effect (meaning this test can be used) for the duration of the COVID-19 declaration under Section 564(b)(1) of the Act, 21 U.S.C.section 360bbb-3(b)(1), unless the authorization is terminated  or revoked sooner.       Influenza A by PCR NEGATIVE NEGATIVE Final   Influenza B by PCR NEGATIVE NEGATIVE Final    Comment: (NOTE) The Xpert Xpress SARS-CoV-2/FLU/RSV plus assay is intended as an aid in the diagnosis of influenza from Nasopharyngeal swab specimens and should not be used as a sole basis for treatment. Nasal washings and aspirates are unacceptable for Xpert Xpress SARS-CoV-2/FLU/RSV testing.  Fact Sheet for Patients: BloggerCourse.com  Fact Sheet for Healthcare Providers: SeriousBroker.it  This test is not yet approved or cleared by the [place] FDA and has been  authorized for detection and/or diagnosis of SARS-CoV-2 by FDA under an Emergency Use Authorization (EUA). This EUA will remain in effect (meaning this test can be used) for the duration of the COVID-19 declaration under Section 564(b)(1) of the Act, 21 U.S.C. section 360bbb-3(b)(1), unless the authorization is terminated or revoked.  Performed at [facility], [address] [place], [place] [address]      Time coordinating discharge: 35 minutes  SIGNED:   Jacquelin Hawking, MD [facility] 10/23/2020, 10:17 AM

## 2020-10-23 NOTE — Discharge Instructions (Signed)
Marie Myers,  You were in the hospital with nausea/vomiting. This has improved with Reglan. The reason for your symptoms are still not completely known. Please manage your symptoms with the Reglan as needed. Please follow-up/establish with general surgery in this area.

## 2021-01-06 ENCOUNTER — Emergency Department (HOSPITAL_COMMUNITY)
Admission: EM | Admit: 2021-01-06 | Discharge: 2021-01-07 | Disposition: A | Discharge status: Home / Self Care | Payer: 59 | Source: Home / Self Care | Attending: Emergency Medicine | Admitting: Emergency Medicine

## 2021-01-06 ENCOUNTER — Encounter (HOSPITAL_COMMUNITY): Payer: Self-pay | Admitting: *Deleted

## 2021-01-06 DIAGNOSIS — Z20822 Contact with and (suspected) exposure to covid-19: Secondary | ICD-10-CM | POA: Insufficient documentation

## 2021-01-06 DIAGNOSIS — F332 Major depressive disorder, recurrent severe without psychotic features: Secondary | ICD-10-CM | POA: Insufficient documentation

## 2021-01-06 DIAGNOSIS — R45851 Suicidal ideations: Secondary | ICD-10-CM

## 2021-01-06 DIAGNOSIS — Y900 Blood alcohol level of less than 20 mg/100 ml: Secondary | ICD-10-CM | POA: Insufficient documentation

## 2021-01-06 LAB — RAPID URINE DRUG SCREEN, HOSP PERFORMED
Amphetamines: NOT DETECTED
Barbiturates: NOT DETECTED
Benzodiazepines: NOT DETECTED
Cocaine: NOT DETECTED
Opiates: NOT DETECTED
Tetrahydrocannabinol: POSITIVE — AB

## 2021-01-06 LAB — CBC WITH DIFFERENTIAL/PLATELET
Abs Immature Granulocytes: 0.02 10*3/uL (ref 0.00–0.07)
Basophils Absolute: 0.1 10*3/uL (ref 0.0–0.1)
Basophils Relative: 1 %
Eosinophils Absolute: 0.1 10*3/uL (ref 0.0–0.5)
Eosinophils Relative: 1 %
HCT: 38.5 % (ref 36.0–46.0)
Hemoglobin: 12.7 g/dL (ref 12.0–15.0)
Immature Granulocytes: 0 %
Lymphocytes Relative: 30 %
Lymphs Abs: 2.6 10*3/uL (ref 0.7–4.0)
MCH: 37 pg — ABNORMAL HIGH (ref 26.0–34.0)
MCHC: 33 g/dL (ref 30.0–36.0)
MCV: 112.2 fL — ABNORMAL HIGH (ref 80.0–100.0)
Monocytes Absolute: 0.9 10*3/uL (ref 0.1–1.0)
Monocytes Relative: 10 %
Neutro Abs: 4.9 10*3/uL (ref 1.7–7.7)
Neutrophils Relative %: 58 %
Platelets: 208 10*3/uL (ref 150–400)
RBC: 3.43 MIL/uL — ABNORMAL LOW (ref 3.87–5.11)
RDW: 13.2 % (ref 11.5–15.5)
WBC: 8.5 10*3/uL (ref 4.0–10.5)
nRBC: 0 % (ref 0.0–0.2)

## 2021-01-06 LAB — COMPREHENSIVE METABOLIC PANEL
ALT: 18 U/L (ref 0–44)
AST: 24 U/L (ref 15–41)
Albumin: 4.3 g/dL (ref 3.5–5.0)
Alkaline Phosphatase: 60 U/L (ref 38–126)
Anion gap: 11 (ref 5–15)
BUN: <5 mg/dL — ABNORMAL LOW (ref 6–20)
CO2: 22 mmol/L (ref 22–32)
Calcium: 9 mg/dL (ref 8.9–10.3)
Chloride: 106 mmol/L (ref 98–111)
Creatinine, Ser: 0.65 mg/dL (ref 0.44–1.00)
GFR, Estimated: >60 mL/min (ref >60)
Glucose, Bld: 84 mg/dL (ref 70–99)
Potassium: 4.1 mmol/L (ref 3.5–5.1)
Sodium: 139 mmol/L (ref 135–145)
Total Bilirubin: 0.4 mg/dL (ref 0.3–1.2)
Total Protein: 7.1 g/dL (ref 6.5–8.1)

## 2021-01-06 LAB — ACETAMINOPHEN LEVEL: Acetaminophen (Tylenol), Serum: <10 ug/mL — ABNORMAL LOW (ref 10–30)

## 2021-01-06 LAB — I-STAT BETA HCG BLOOD, ED (MC, WL, AP ONLY)
I-stat hCG, quantitative: <5 m[IU]/mL (ref <5)
I-stat hCG, quantitative: <5 m[IU]/mL (ref <5)
I-stat hCG, quantitative: <5 m[IU]/mL (ref <5)

## 2021-01-06 LAB — RESP PANEL BY RT-PCR (FLU A&B, COVID) ARPGX2
Influenza A by PCR: NEGATIVE
Influenza B by PCR: NEGATIVE
SARS Coronavirus 2 by RT PCR: NEGATIVE

## 2021-01-06 LAB — ETHANOL: Alcohol, Ethyl (B): <10 mg/dL (ref <10)

## 2021-01-06 LAB — SALICYLATE LEVEL: Salicylate Lvl: <7 mg/dL — ABNORMAL LOW (ref 7.0–30.0)

## 2021-01-06 IMAGING — CR DG ANKLE COMPLETE 3+V*L*
3 series · 3 of 3 positions shown · non-contrast
Comparison: None.

CLINICAL DATA: Fall with wound

EXAM:
LEFT ANKLE COMPLETE - 3+ VIEW

[t ankle joint oblique left (1 of 2)]
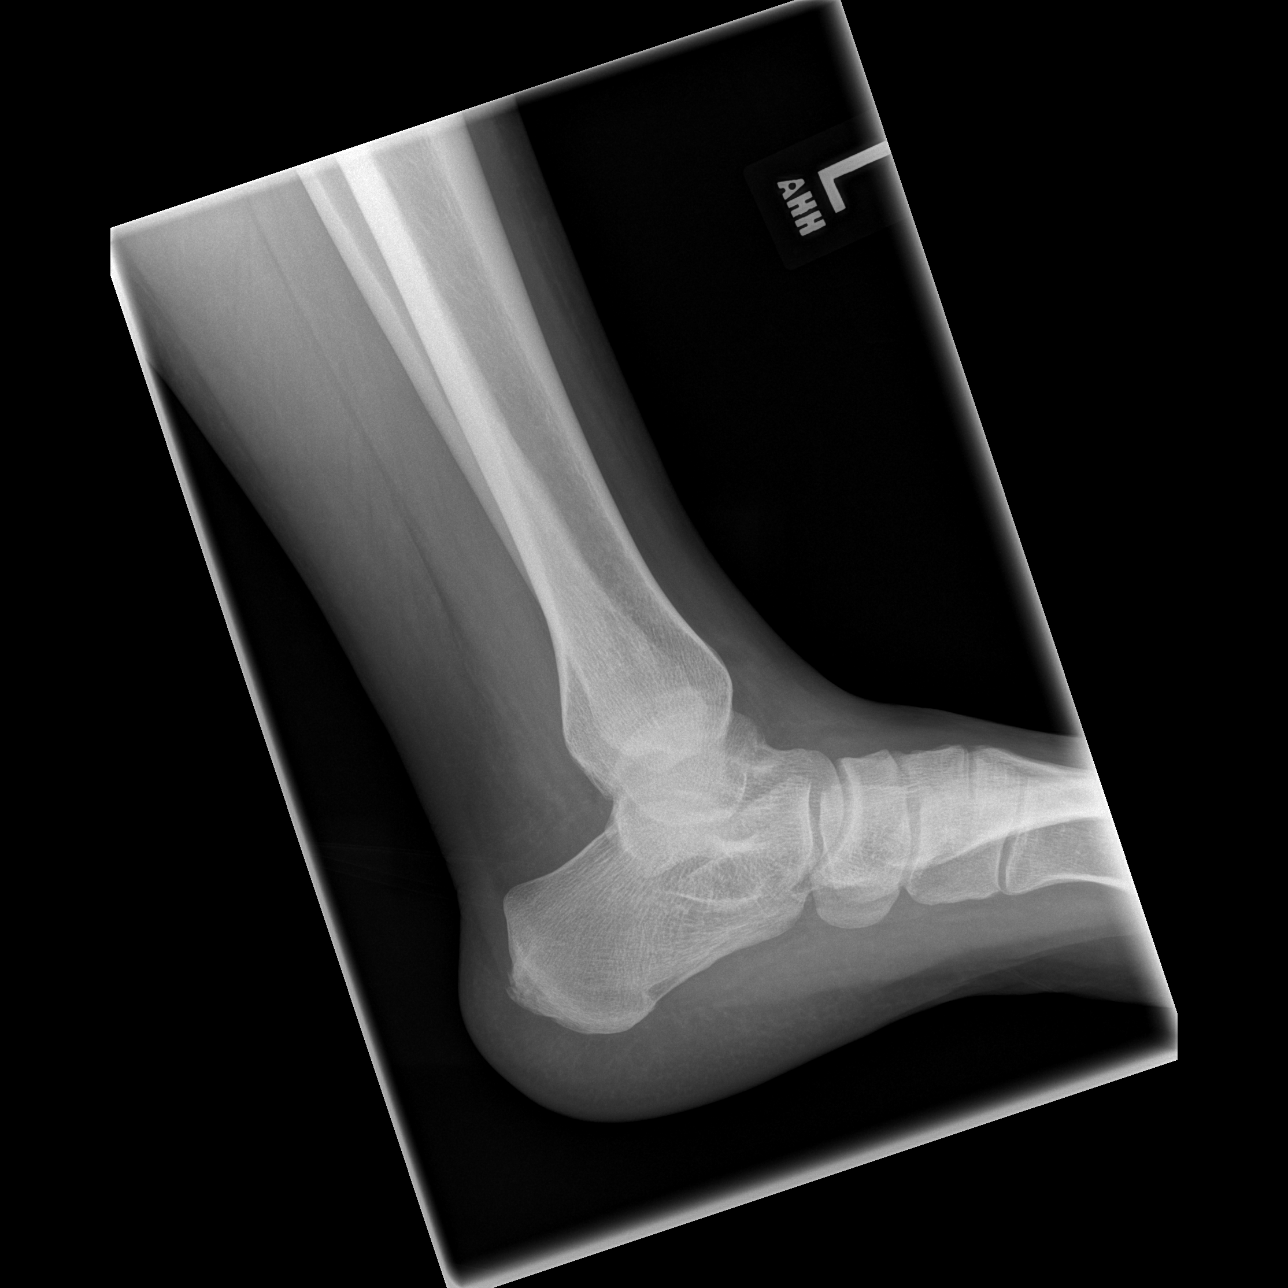

[t ankle joint lat left]
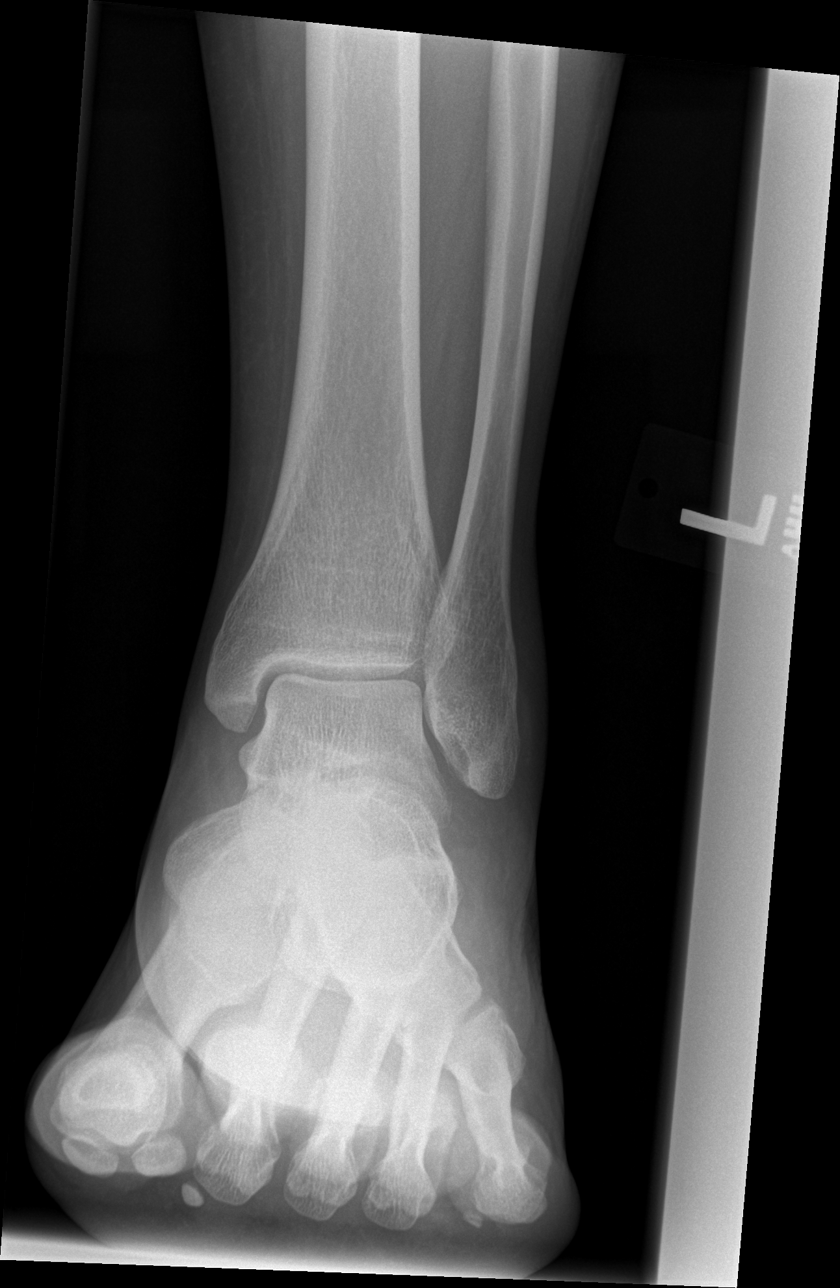

[t ankle joint oblique left (2 of 2)]
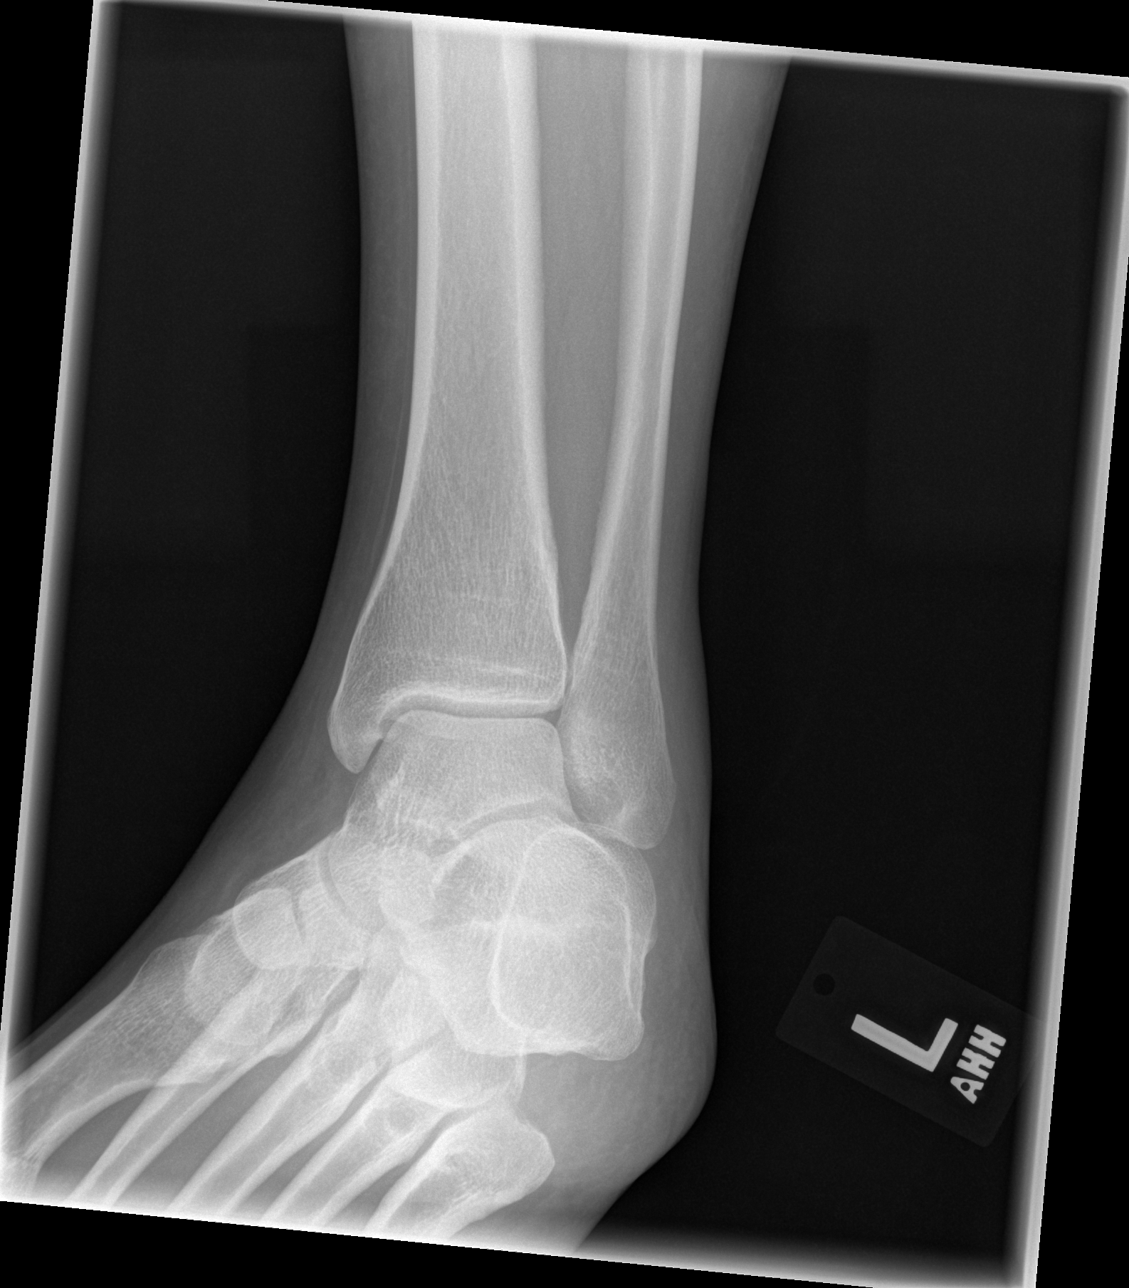

[3 of 3 positions shown; findings below may reference images not displayed]

FINDINGS: There is no evidence of fracture, dislocation, or joint effusion.
There is no evidence of arthropathy or other focal bone abnormality.
Soft tissues are unremarkable.
IMPRESSION: Negative.
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
## 2021-01-06 NOTE — ED Notes (Signed)
TTS in progress. Sitter at bedside.

## 2021-01-06 NOTE — ED Notes (Signed)
Called staffing was told a sitter will be here at 7 for the pt.

## 2021-01-06 NOTE — ED Provider Notes (Signed)
[facility] EMERGENCY DEPARTMENT Provider Note   CSN: [id number] Arrival date & time: 01/06/21  1129     History Chief Complaint  Patient presents with  . Suicidal    Marie Myers is a 30 y.o. female.  30 year old female with prior medical history as detailed below presents for evaluation.  Patient is reporting active suicidal ideation.  She is contemplating suicide by overdose.  She does report prior history of SI with suicide attempts previously.  Her last suicide attempt consisted of an OD.  She denies medical complaint.  She specifically denies fever, chest pain, shortness of breath, nausea, vomiting, or other issue.  She denies recent overdose or significant use of drugs.  During the evaluation she is calm and cooperative.  She is here on a voluntary basis.  The history is provided by the patient and medical records.  Mental Health Problem Presenting symptoms: suicidal thoughts   Degree of incapacity (severity):  Moderate Onset quality:  Gradual Duration:  5 days Timing:  Constant Progression:  Worsening      History reviewed. No pertinent past medical history.  Patient Active Problem List   Diagnosis Date Noted  . Metabolic acidemia 10/21/2020  . Tetrahydrocannabinol (THC) dependence (HCC) 10/16/2020  . Acute lower UTI 10/16/2020  . Tachypnea 10/16/2020  . Dehydration 10/16/2020  . Hypertensive urgency 10/16/2020  . Intractable nausea and vomiting 10/16/2020  . Abdominal pain 08/11/2019    Past Surgical History:  Procedure Laterality Date  . CHOLECYSTECTOMY    . GASTRIC BYPASS    . LAPAROSCOPY N/A 08/11/2019   Procedure: LAPAROSCOPY DIAGNOSTIC;  Surgeon: Harriette Bouillon, MD;  Location: [facility] OR;  Service: General;  Laterality: N/A;     OB History   No obstetric history on file.     History reviewed. No pertinent family history.  Social History   Tobacco Use  . Smoking status: Never Smoker  . Smokeless tobacco: Never Used   Substance Use Topics  . Alcohol use: Yes    Comment: occasionally  . Drug use: Never    Home Medications Prior to Admission medications   Medication Sig Start Date End Date Taking? Authorizing Provider  acetaminophen (TYLENOL) 500 MG tablet Take 2 tablets (1,000 mg total) by mouth every 8 (eight) hours as needed for moderate pain. 10/23/20   Narda Bonds, MD  amitriptyline (ELAVIL) 100 MG tablet Take 100 mg by mouth at bedtime as needed for sleep. 10/14/20   [provider]  dicyclomine (BENTYL) 20 MG tablet Take 20 mg by mouth in the morning and at bedtime.    [provider]  diphenhydrAMINE (BENADRYL) 25 MG tablet Take 25 mg by mouth every 6 (six) hours as needed for itching or allergies.    [provider]  metoCLOPramide (REGLAN) 10 MG tablet Take 0.5-1 tablets (5-10 mg total) by mouth every 8 (eight) hours as needed for nausea. 10/23/20   Narda Bonds, MD  omeprazole (PRILOSEC) 20 MG capsule Take 40 mg by mouth daily.    [provider]    Allergies    Zithromax [azithromycin]  Review of Systems   Review of Systems  Psychiatric/Behavioral: Positive for suicidal ideas.  All other systems reviewed and are negative.   Physical Exam Updated Vital Signs BP 136/79 (BP Location: Right Arm)   Pulse 68   Temp 99.2 F (37.3 C) (Oral)   Resp 17   LMP 01/06/2021   SpO2 100%   Physical Exam Vitals and nursing note reviewed.  Constitutional:      General: She is not in acute distress.    Appearance: Normal appearance. She is well-developed.  HENT:     Head: Normocephalic and atraumatic.  Eyes:     Conjunctiva/sclera: Conjunctivae normal.     Pupils: Pupils are equal, round, and reactive to light.  Cardiovascular:     Rate and Rhythm: Normal rate and regular rhythm.     Heart sounds: Normal heart sounds.  Pulmonary:     Effort: Pulmonary effort is normal. No respiratory distress.     Breath sounds: Normal breath sounds.  Abdominal:      General: There is no distension.     Palpations: Abdomen is soft.     Tenderness: There is no abdominal tenderness.  Musculoskeletal:        General: No deformity. Normal range of motion.     Cervical back: Normal range of motion and neck supple.  Skin:    General: Skin is warm and dry.  Neurological:     General: No focal deficit present.     Mental Status: She is alert and oriented to person, place, and time. Mental status is at baseline.  Psychiatric:     Comments: Endorses SI with plan to OD     ED Results / Procedures / Treatments   Labs (all labs ordered are listed, but only abnormal results are displayed) Labs Reviewed  COMPREHENSIVE METABOLIC PANEL - Abnormal; Notable for the following components:      Result Value   BUN <5 (*)    All other components within normal limits  CBC WITH DIFFERENTIAL/PLATELET - Abnormal; Notable for the following components:   RBC 3.43 (*)    MCV 112.2 (*)    MCH 37.0 (*)    All other components within normal limits  ACETAMINOPHEN LEVEL - Abnormal; Notable for the following components:   Acetaminophen (Tylenol), Serum <10 (*)    All other components within normal limits  SALICYLATE LEVEL - Abnormal; Notable for the following components:   Salicylate Lvl <7.0 (*)    All other components within normal limits  RESP PANEL BY RT-PCR (FLU A&B, COVID) ARPGX2  ETHANOL  RAPID URINE DRUG SCREEN, HOSP PERFORMED  I-STAT BETA HCG BLOOD, ED ([facility], [facility], [facility] ONLY)  I-STAT BETA HCG BLOOD, ED ([facility], [facility], [facility] ONLY)  I-STAT BETA HCG BLOOD, ED ([facility], [facility], [facility] ONLY)    EKG None  Radiology No results found.  Procedures Procedures   Medications Ordered in ED Medications - No data to display  ED Course  I have reviewed the triage vital signs and the nursing notes.  Pertinent labs & imaging results that were available during my care of the patient were reviewed by me and considered in my medical decision making (see chart for details).    MDM  Rules/Calculators/A&P                          MDM  MSE complete  Marie Myers was evaluated in Emergency Department on 01/06/2021 for the symptoms described in the history of present illness. She was evaluated in the context of the global COVID-19 pandemic, which necessitated consideration that the patient might be at risk for infection with the SARS-CoV-2 virus that causes COVID-19. Institutional protocols and algorithms that pertain to the evaluation of patients at risk for COVID-19 are in a state of rapid change based on information released by regulatory bodies including the CDC and federal and state  organizations. These policies and algorithms were followed during the patient's care in the ED.  Patient is presenting with complaint of active SI with plan to OD.  Patient with prior history of suicide attempt in the past.  She is medically clear at this time for further evaluation by psychiatry.  She arrives on a voluntary basis.    Final Clinical Impression(s) / ED Diagnoses Final diagnoses:  Suicidal ideation    Rx / DC Orders ED Discharge Orders    None       Wynetta Fines, MD 01/06/21 2359

## 2021-01-06 NOTE — ED Triage Notes (Signed)
Pt reports feeling very depressed and suicidal. No specific plan and no attempts to harm herself pta. Pt has hx of same. Denies HI. Is calm and cooperative at triage.

## 2021-01-06 NOTE — BH Assessment (Signed)
Clinician spoke to [place] Robinson, RN and will call TTS cart in 5 minutes to complete the pt's assessment.     Redmond Pulling, MS, [facility], [facility] Triage Specialist [phone]

## 2021-01-06 NOTE — ED Provider Notes (Signed)
Emergency Medicine Provider Triage Evaluation Note  Marie Myers , a 30 y.o. female  was evaluated in triage.  Pt complains of suicidals ideations x few days.  Prior symptoms consisted of taking pills.  She has been falling a lot, Coming out, taking.  Also reports throwing objects to her fianc yesterday.  Review of Systems  Positive: SI, depression Negative: HI  Physical Exam  BP 136/79 (BP Location: Right Arm)   Pulse 68   Temp 99.2 F (37.3 C) (Oral)   Resp 17   LMP 01/06/2021   SpO2 100%  Gen:   Awake, no distress , teary eyed on exam Resp:  Normal effort  MSK:   Moves extremities without difficulty  Other:    Medical Decision Making  Medically screening exam initiated at 2:05 PM.  Appropriate orders placed.  Marie Myers was informed that the remainder of the evaluation will be completed by another provider, this initial triage assessment does not replace that evaluation, and the importance of remaining in the ED until their evaluation is complete.  Patient ideations, prior suicide attempt.  Denies homicidal, visual auditory hallucinations.  Prior history of mental health.  No psychiatric medication seen on her home medications.      Claude Manges, PA-C 01/06/21 1406    Milagros Loll, MD 01/07/21 1003

## 2021-01-07 ENCOUNTER — Encounter (HOSPITAL_COMMUNITY): Payer: Self-pay | Admitting: Student

## 2021-01-07 ENCOUNTER — Other Ambulatory Visit: Payer: Self-pay

## 2021-01-07 ENCOUNTER — Inpatient Hospital Stay (HOSPITAL_COMMUNITY)
Admission: AD | Admit: 2021-01-07 | Discharge: 2021-01-13 | DRG: 885 | Disposition: A | Discharge status: Home / Self Care | Payer: 59 | Attending: Psychiatry | Admitting: Psychiatry

## 2021-01-07 DIAGNOSIS — Z20822 Contact with and (suspected) exposure to covid-19: Secondary | ICD-10-CM | POA: Diagnosis present

## 2021-01-07 DIAGNOSIS — R45851 Suicidal ideations: Secondary | ICD-10-CM | POA: Diagnosis present

## 2021-01-07 DIAGNOSIS — F1020 Alcohol dependence, uncomplicated: Secondary | ICD-10-CM | POA: Diagnosis present

## 2021-01-07 DIAGNOSIS — Z638 Other specified problems related to primary support group: Secondary | ICD-10-CM | POA: Diagnosis not present

## 2021-01-07 DIAGNOSIS — Z560 Unemployment, unspecified: Secondary | ICD-10-CM

## 2021-01-07 DIAGNOSIS — K219 Gastro-esophageal reflux disease without esophagitis: Secondary | ICD-10-CM | POA: Diagnosis present

## 2021-01-07 DIAGNOSIS — Z818 Family history of other mental and behavioral disorders: Secondary | ICD-10-CM | POA: Diagnosis not present

## 2021-01-07 DIAGNOSIS — G4700 Insomnia, unspecified: Secondary | ICD-10-CM | POA: Diagnosis present

## 2021-01-07 DIAGNOSIS — F1290 Cannabis use, unspecified, uncomplicated: Secondary | ICD-10-CM | POA: Diagnosis present

## 2021-01-07 DIAGNOSIS — I10 Essential (primary) hypertension: Secondary | ICD-10-CM | POA: Diagnosis present

## 2021-01-07 DIAGNOSIS — Z9884 Bariatric surgery status: Secondary | ICD-10-CM | POA: Diagnosis not present

## 2021-01-07 DIAGNOSIS — F419 Anxiety disorder, unspecified: Secondary | ICD-10-CM | POA: Diagnosis present

## 2021-01-07 DIAGNOSIS — Z9151 Personal history of suicidal behavior: Secondary | ICD-10-CM | POA: Diagnosis not present

## 2021-01-07 DIAGNOSIS — F332 Major depressive disorder, recurrent severe without psychotic features: Principal | ICD-10-CM | POA: Diagnosis present

## 2021-01-07 DIAGNOSIS — Z7289 Other problems related to lifestyle: Secondary | ICD-10-CM

## 2021-01-07 MED ORDER — HYDROXYZINE HCL 25 MG PO TABS: 25.0000 mg | ORAL_TABLET | Freq: Three times a day (TID) | ORAL | Status: DC | PRN

## 2021-01-07 MED ORDER — PANTOPRAZOLE SODIUM 40 MG PO TBEC
80.0000 mg | DELAYED_RELEASE_TABLET | Freq: Every day | ORAL | Status: DC
  Administered 2021-01-08 – 2021-01-13 (×6): 80 mg via ORAL
  Filled 2021-01-07 (×9): qty 2

## 2021-01-07 MED ORDER — ONDANSETRON 4 MG PO TBDP
8.0000 mg | ORAL_TABLET | Freq: Four times a day (QID) | ORAL | Status: AC | PRN
  Administered 2021-01-10: 8 mg via ORAL
  Filled 2021-01-07: qty 2

## 2021-01-07 MED ORDER — MAGNESIUM HYDROXIDE 400 MG/5ML PO SUSP: 30.0000 mL | Freq: Every day | ORAL | Status: DC | PRN

## 2021-01-07 MED ORDER — ADULT MULTIVITAMIN W/MINERALS CH
1.0000 | ORAL_TABLET | Freq: Every day | ORAL | Status: DC
  Administered 2021-01-07 – 2021-01-13 (×7): 1 via ORAL
  Filled 2021-01-07 (×10): qty 1

## 2021-01-07 MED ORDER — METOCLOPRAMIDE HCL 10 MG PO TABS: 5.0000 mg | ORAL_TABLET | Freq: Three times a day (TID) | ORAL | Status: DC | PRN

## 2021-01-07 MED ORDER — ACETAMINOPHEN 325 MG PO TABS
650.0000 mg | ORAL_TABLET | Freq: Four times a day (QID) | ORAL | Status: DC | PRN
  Administered 2021-01-08 – 2021-01-12 (×5): 650 mg via ORAL
  Filled 2021-01-07 (×5): qty 2

## 2021-01-07 MED ORDER — CHLORDIAZEPOXIDE HCL 25 MG PO CAPS: 25.0000 mg | ORAL_CAPSULE | Freq: Four times a day (QID) | ORAL | Status: AC | PRN

## 2021-01-07 MED ORDER — ZOLPIDEM TARTRATE 5 MG PO TABS
5.0000 mg | ORAL_TABLET | Freq: Every evening | ORAL | Status: DC | PRN
  Administered 2021-01-07: 5 mg via ORAL
  Filled 2021-01-07: qty 1

## 2021-01-07 MED ORDER — THIAMINE HCL 100 MG PO TABS
100.0000 mg | ORAL_TABLET | Freq: Every day | ORAL | Status: DC
  Administered 2021-01-08 – 2021-01-13 (×5): 100 mg via ORAL
  Filled 2021-01-07 (×8): qty 1

## 2021-01-07 MED ORDER — FOLIC ACID 1 MG PO TABS
1.0000 mg | ORAL_TABLET | Freq: Every day | ORAL | Status: DC
  Administered 2021-01-07 – 2021-01-13 (×7): 1 mg via ORAL
  Filled 2021-01-07 (×10): qty 1

## 2021-01-07 MED ORDER — DICYCLOMINE HCL 20 MG PO TABS
20.0000 mg | ORAL_TABLET | Freq: Two times a day (BID) | ORAL | Status: DC
  Administered 2021-01-07 – 2021-01-13 (×11): 20 mg via ORAL
  Filled 2021-01-07 (×18): qty 1

## 2021-01-07 MED ORDER — LOPERAMIDE HCL 2 MG PO CAPS
2.0000 mg | ORAL_CAPSULE | ORAL | Status: AC | PRN
Start: 2021-01-07 — End: 2021-01-10

## 2021-01-07 MED ORDER — ALUM & MAG HYDROXIDE-SIMETH 200-200-20 MG/5ML PO SUSP: 30.0000 mL | ORAL | Status: DC | PRN

## 2021-01-07 MED ORDER — ONDANSETRON 4 MG PO TBDP: 4.0000 mg | ORAL_TABLET | Freq: Four times a day (QID) | ORAL | Status: DC | PRN

## 2021-01-07 MED ORDER — THIAMINE HCL 100 MG PO TABS: 100.0000 mg | ORAL_TABLET | Freq: Every day | ORAL | Status: DC

## 2021-01-07 MED ORDER — TRAZODONE HCL 50 MG PO TABS
50.0000 mg | ORAL_TABLET | Freq: Every evening | ORAL | Status: DC | PRN
  Administered 2021-01-07 – 2021-01-08 (×2): 50 mg via ORAL
  Filled 2021-01-07 (×2): qty 1

## 2021-01-07 MED ORDER — HYDROXYZINE HCL 25 MG PO TABS
25.0000 mg | ORAL_TABLET | Freq: Four times a day (QID) | ORAL | Status: DC | PRN
  Administered 2021-01-07 – 2021-01-09 (×4): 25 mg via ORAL
  Filled 2021-01-07 (×5): qty 1

## 2021-01-07 MED ORDER — THIAMINE HCL 100 MG/ML IJ SOLN
100.0000 mg | Freq: Once | INTRAMUSCULAR | Status: AC
  Administered 2021-01-09: 100 mg via INTRAMUSCULAR
  Filled 2021-01-07: qty 2

## 2021-01-07 NOTE — ED Notes (Signed)
Pt has been watching cell phone while in hallway.  Pt has no cords. Pt states she has trouble sleeping and usually uses wine to sleep so she is not doing well falling asleep right not.  Pt is in calm spirits. Pt states she would like food but isnt really feeling what we have.  Pt is content to wait for breakfast tray at this point.

## 2021-01-07 NOTE — ED Notes (Signed)
[facility] After 0900 Placed Breakfast order

## 2021-01-07 NOTE — ED Notes (Signed)
Attempted report x1. 

## 2021-01-07 NOTE — BH Assessment (Signed)
Updated disposition: Tosin, AC, RN has accepted the pt to [facility] and is assigned to room/bed: 306-2. Pt to come at 0900. Attending physician: Dr. Jola Babinski. Nursing report: [phone]. Updated disposition discussed with Sharia Reeve, RN. RN to discuss updated disposition with EDP.    Redmond Pulling, MS, [facility], [facility] Triage Specialist [phone]

## 2021-01-07 NOTE — Progress Notes (Signed)
Earlier MHT reported to this Clinical research associate that pt was irritable and cursing because she had not seen a doctor since she was admitted this morning. This writer was able to spend 1:1 time with the pt to address her questions and needs. Pt was able to verbally deescalate. Pt said that she got admitted at 11:45 this morning and had not been seen by a doctor or NP. Pt was upset because she had seen other pt's get assessed. Informed and provided pt reassurance that a provider will see her in the morning. She said that she had only seen her nurse who completed her admission assessment. Pt said that she is anxious because this is her first time inpatient here and doesn't know how things will roll out. Pt said she would like to discuss her medications and pt was informed that her medications for tonight will be discussed with her. Reassured pt that she will be educated on her medications. Her goals are to figure out why she has been so depressed, angry, and hating herself. She did attend and participate in group. She does endorse AH at night of her 1 year old son crying and screaming. Pt said that she lives in a mobile home and other homes are spaced out, so she doesn't know what else it could be that she is hearing since she knows her son is asleep at that time. She denies VH and HI. Pt is passively suicidal without a plan or intent. She verbally contracts for safety. Pt agrees to notify staff immediately for any thoughts of hurting herself or anyone else. Active listening, reassurance, and support provided. Medications administered as ordered by provider. Q 15 min safety checks continue. Pt's safety has been maintained.   01/07/21 2015  Psych Admission Type (Psych Patients Only)  Admission Status Voluntary  Psychosocial Assessment  Patient Complaints Anxiety;Anger;Depression;Irritability;Sadness;Worrying  Eye Contact Fair  Facial Expression Anxious;Pensive  Affect Anxious;Appropriate to  circumstance;Depressed;Irritable;Labile  Speech Logical/coherent;Argumentative  Interaction Assertive;Intrusive  Motor Activity Fidgety  Appearance/Hygiene Unremarkable  Behavior Characteristics Cooperative;Anxious;Fidgety;Irritable  Mood Depressed;Anxious;Labile;Irritable;Sad  Thought Process  Coherency WDL  Content Blaming others  Delusions None reported or observed  Perception Hallucinations  Hallucination Auditory  Judgment Poor  Confusion None  Danger to Self  Current suicidal ideation? Passive  Self-Injurious Behavior No self-injurious ideation or behavior indicators observed or expressed   Agreement Not to Harm Self Yes  Description of Agreement agrees to notify staff immediately for any thoughts of hurting herself or anyone else  Danger to Others  Danger to Others None reported or observed

## 2021-01-07 NOTE — Progress Notes (Signed)
Patient said she has been here 2 days and it is "fuck up" the way the do things here. Patient said her admission was not complete but later she is told by the damn nurse her admission is completed. Marie Myers said how the hell admission complete when no questions was ask from her about her own damn admission. Patient said she has been her two damn days and have not seen a doctor. Patient said she need her medication and if she do not see a doctor tomorrow she will walk the hell out. Marie Myers said she do not care nothing about being short staff. Her family is short staff without her being at home. Patient said her job is short staff without her being there. Marie Myers said she will go off in this place. RN notify

## 2021-01-07 NOTE — Tx Team (Signed)
Initial Treatment Plan 01/07/2021 6:53 PM Marie Myers [id number]    PATIENT STRESSORS: Educational concerns Financial difficulties Occupational concerns Substance abuse   PATIENT STRENGTHS: Ability for insight Average or above average intelligence Capable of independent living Communication skills General fund of knowledge Motivation for treatment/growth Physical Health Supportive family/friends Work skills   PATIENT IDENTIFIED PROBLEMS: "suicide thought"  "substance use"  "anxiety"  "depression"               DISCHARGE CRITERIA:  Ability to meet basic life and health needs Adequate post-discharge living arrangements Improved stabilization in mood, thinking, and/or behavior Medical problems require only outpatient monitoring Motivation to continue treatment in a less acute level of care Need for constant or close observation no longer present Reduction of life-threatening or endangering symptoms to within safe limits Safe-care adequate arrangements made Verbal commitment to aftercare and medication compliance Withdrawal symptoms are absent or subacute and managed without 24-hour nursing intervention  PRELIMINARY DISCHARGE PLAN: Attend aftercare/continuing care group Attend PHP/IOP Attend 12-step recovery group Outpatient therapy Return to previous living arrangement Return to previous work or school arrangements  PATIENT/FAMILY INVOLVEMENT: This treatment plan has been presented to and reviewed with the patient, Marie Myers.  The patient and family have been given the opportunity to ask questions and make suggestions.  Quintella Reichert [place], [place] 01/07/2021, 6:53 PM

## 2021-01-07 NOTE — Progress Notes (Cosign Needed)
Adult Psychoeducational Group Note  Date:  01/07/2021 Time:  6:54 PM  Group Topic/Focus:  Coping With Mental Health Crisis:   The purpose of this group is to help patients identify strategies for coping with mental health crisis.  Group discusses possible causes of crisis and ways to manage them effectively.  Participation Level:  Active  Participation Quality:  Attentive  Affect:  Appropriate  Cognitive:  Alert  Insight: Appropriate  Engagement in Group:  Engaged  Modes of Intervention:  Discussion  Additional Comments:  Pt attended group and participated in discussion.  Sherria Riemann R Arvin Abello 01/07/2021, 6:54 PM

## 2021-01-07 NOTE — ED Notes (Signed)
Sitter at bedside, pt received breakfast tray.

## 2021-01-07 NOTE — Progress Notes (Signed)
Patient is 30 yr old female.  Went to [facility] ED yesterday stating she has SI thoughts for the past week.  Plans were to overdose on pills and alcohol.  History of overdose 3 yrs ago.  Has been drinking alcohol to sleep.  UDS positive for THC.  Healed wound on L foot, few bruises on legs.  Tattoos on L/R arms, L outer leg, L chest.  Scar on L upper leg and scars on abdomen from bypass surgery in 2016.  Wears glasses.  No hearing problems.  Has not seen dentist in over one yr.  Works at [employer].  Was in [place] [facility] four yrs ago.  Rated depression and hopeless 10, anxiety 9.  No tobacco.  THC since age of 16 yrs, rolls one clip and smokes throughout day.  Last THC was 2 days ago.  Alcohol since age 80 yrs, "a lot".  May drink 4 glasses wine throughout day, last alcohol was 2 days ago.  Denied cocaine and heroin use.  SI yes, contracts for safety.   Very depressed, grieving, feels unworthy.  Mom puts her down, lives with fiancee.  Denied HI.  At night hears voices, noises, cries, shadows.  Decreased sleep, hears screams, help.  Hears 50 yr old son crying, goes to check on him, and child is sleeping.   Fall risk information given and reviewed with patient, low fall risk.  Patient given food/drink, oriented to 300 hall.

## 2021-01-07 NOTE — ED Notes (Signed)
EDP's notified that pt has bed assigned for after 9am.

## 2021-01-07 NOTE — Plan of Care (Signed)
Nurse discussed coping skills with patient.  

## 2021-01-08 ENCOUNTER — Encounter (HOSPITAL_COMMUNITY): Payer: Self-pay | Admitting: Student

## 2021-01-08 DIAGNOSIS — F1020 Alcohol dependence, uncomplicated: Secondary | ICD-10-CM | POA: Diagnosis present

## 2021-01-08 DIAGNOSIS — F332 Major depressive disorder, recurrent severe without psychotic features: Principal | ICD-10-CM

## 2021-01-08 DIAGNOSIS — F1290 Cannabis use, unspecified, uncomplicated: Secondary | ICD-10-CM | POA: Diagnosis present

## 2021-01-08 HISTORY — DX: Cannabis use, unspecified, uncomplicated: F12.90

## 2021-01-08 HISTORY — DX: Alcohol dependence, uncomplicated: F10.20

## 2021-01-08 LAB — VITAMIN B12: Vitamin B-12: 130 pg/mL — ABNORMAL LOW (ref 180–914)

## 2021-01-08 LAB — LIPID PANEL
Cholesterol: 166 mg/dL (ref 0–200)
HDL: 119 mg/dL (ref >40)
LDL Cholesterol: 38 mg/dL (ref 0–99)
Total CHOL/HDL Ratio: 1.4 RATIO
Triglycerides: 46 mg/dL (ref <150)
VLDL: 9 mg/dL (ref 0–40)

## 2021-01-08 LAB — HEMOGLOBIN A1C
Hgb A1c MFr Bld: 4.7 % — ABNORMAL LOW (ref 4.8–5.6)
Mean Plasma Glucose: 88.19 mg/dL

## 2021-01-08 LAB — TSH: TSH: 1.087 u[IU]/mL (ref 0.350–4.500)

## 2021-01-08 MED ORDER — METOCLOPRAMIDE HCL 10 MG PO TABS
5.0000 mg | ORAL_TABLET | Freq: Three times a day (TID) | ORAL | Status: DC | PRN
  Administered 2021-01-10 – 2021-01-13 (×3): 5 mg via ORAL
  Filled 2021-01-08 (×3): qty 1

## 2021-01-08 MED ORDER — SERTRALINE HCL 50 MG PO TABS
50.0000 mg | ORAL_TABLET | Freq: Every day | ORAL | Status: DC
  Administered 2021-01-08 – 2021-01-09 (×2): 50 mg via ORAL
  Filled 2021-01-08 (×5): qty 1

## 2021-01-08 MED ORDER — ARIPIPRAZOLE 5 MG PO TABS
5.0000 mg | ORAL_TABLET | Freq: Every day | ORAL | Status: DC
  Administered 2021-01-08 – 2021-01-09 (×2): 5 mg via ORAL
  Filled 2021-01-08 (×6): qty 1

## 2021-01-08 NOTE — [facility] Suicide Risk Assessment (Signed)
[facility] Admission Suicide Risk Assessment   Nursing information obtained from:  Patient Demographic factors:  Adolescent or young adult,Low socioeconomic status Current Mental Status:  Suicidal ideation indicated by patient Loss Factors:  Financial problems / change in socioeconomic status Historical Factors:  Impulsivity Risk Reduction Factors:  Responsible for children under 30 years of age,Employed,Living with another person, especially a relative  Total Time spent with patient: 30 minutes Principal Problem: MDD (major depressive disorder), recurrent severe, without psychosis ([place]) Diagnosis:  Principal Problem:   MDD (major depressive disorder), recurrent severe, without psychosis ([place]) Active Problems:   Moderate alcohol use disorder (HCC)   Marijuana use  Subjective Data: Record reviewed.  Patient's case discussed in detail with members of the treatment team.  I met with and evaluated the patient in the office on the unit today.  Marie Myers is a 30 year old female with a history of depression and anxiety who presented voluntarily and unaccompanied to [facility] emergency department on 01/06/2021 with chief complaint of increased depressive symptoms and worsening suicidal ideation with a plan to kill herself by overdose on pills in combination with alcohol.  Urine drug screen in the ED was positive for THC.  The patient reported that she had been drinking wine at night to help herself fall asleep.  She was transferred to [facility] on the evening of 01/07/2021 for further treatment and stabilization of her depressive symptoms and suicidal ideation.  During interview with me today, the patient presents with depressed mood, constricted and intermittently tearful affect and soft speech.  She reports that she has been experiencing suicidal thoughts for a period of months but they have increased in intensity and frequency over the last week.  She reports that she has been feeling more  helpless, miserable, irritable and at times becomes extremely agitated and upset upset and engages in throwing stuff and cursing at her home.  She endorses symptoms of sad mood, insomnia, decreased interest, poor motivation, decreased concentration, low energy, anxiety and suicidal ideation.  She states that she was having thoughts about taking an overdose on pills in combination with alcohol.  The patient reports recent stressors of job loss, financial stressors, issues with her mother, being away from family and multiple recent family deaths which she is grieving.  The patient states that she stopped herself from attempting suicide in sought help because of her son.  The patient experiences auditory hallucinations when she attempts to go to sleep of mumbling sounds, screams for help or sounds as though the TV is on.  She reports visual hallucinations of seeing shadows at night.  The patient denies AI, HI, PI.  She denies any history of childhood trauma.    The patient reports that she has been using alcohol on a nightly basis in an effort to help her self sleep.  She estimates that she drinks 4-5 tall cups of wine most nights and on other nights she may drink up to 6 tall cups of wine.  She denies ever experiencing any symptoms of alcohol withdrawal and denies any current symptoms of nausea vomiting, tremor, headache.  Patient also reports daily use of marijuana throughout the day and estimates she smokes 1 clip over the course of the day which helps reduce her anxiety.    Patient reports a history of 1 prior suicide attempt 3 to 4 years ago by overdose on medications in combination with alcohol.  She was admitted following that suicide attempt to [facility] for approximately 7 to  10 days.  She states she was not discharged on medication.  She currently does not take any psychiatric medications.  She has a therapist [name] whom she sees infrequently and was unable to get another appointment to see  until June.  She denies any history of nonsuicidal self-injurious behavior.  The patient denies any history of periods meeting criteria for hypomania or mania.    The patient reports that her sister has been diagnosed with bipolar disorder and her mother has some sort of mental health issues.  The patient denies any family history of suicide.  She reports that her sister has had substance issues and some of her paternal relatives have problems with alcohol.  The patient denies any history of seizure or head injury with concussion or loss of consciousness.  During our conversation the patient states that she still has passive wishes not to be alive but she states she can remain safe in the hospital.  She is interested in starting medication for depression and anxiety.  She would also like to get connected with a therapist who can help her with grief counseling.  Continued Clinical Symptoms:  Alcohol Use Disorder Identification Test Final Score (AUDIT): 32 The "Alcohol Use Disorders Identification Test", Guidelines for Use in Primary Care, Second Edition.  World Pharmacologist [facility]). Score between 0-7:  no or low risk or alcohol related problems. Score between 8-15:  moderate risk of alcohol related problems. Score between 16-19:  high risk of alcohol related problems. Score 20 or above:  warrants further diagnostic evaluation for alcohol dependence and treatment.   CLINICAL FACTORS:   Severe Anxiety and/or Agitation Depression:   Anhedonia Comorbid alcohol abuse/dependence Hopelessness Impulsivity Insomnia Alcohol/Substance Abuse/Dependencies More than one psychiatric diagnosis Previous Psychiatric Diagnoses and Treatments   Musculoskeletal: Strength & Muscle Tone: within normal limits Gait & Station: normal Patient leans: N/A  Psychiatric Specialty Exam:  Presentation  General Appearance: Appropriate for Environment; Casual  Eye Contact:Fair  Speech:Clear and Coherent;  Normal Rate  Speech Volume:Normal  Handedness:No data recorded  Mood and Affect  Mood:Depressed; Anxious; Irritable  Affect:Constricted; Congruent; Tearful   Thought Process  Thought Processes:Coherent; Goal Directed  Descriptions of Associations:Intact  Orientation:Full (Time, Place and Person)  Thought Content:Logical; Rumination  History of Schizophrenia/Schizoaffective disorder:No data recorded Duration of Psychotic Symptoms:No data recorded Hallucinations:Hallucinations: Auditory; Visual Description of Auditory Hallucinations: Hears sounds of mumbling, screams for help or sounds like the TV is on Description of Visual Hallucinations: Sees shadows at night  Ideas of Reference:No data recorded Suicidal Thoughts:Suicidal Thoughts: Yes, Active SI Active Intent and/or Plan: Without Intent; With Plan  Homicidal Thoughts:Homicidal Thoughts: No   Sensorium  Memory:Immediate Good; Recent Good; Remote Good  Judgment:Fair  Insight:Fair   Executive Functions  Concentration:Fair  Attention Span:Fair  [place]  Language:Good   Psychomotor Activity  Psychomotor Activity:Psychomotor Activity: Decreased   Assets  Assets:Communication Skills; Desire for Improvement; Housing; Resilience; Social Support   Sleep  Sleep:Sleep: Fair Number of Hours of Sleep: 6.75    Physical Exam: Physical Exam Vitals and nursing note reviewed.  HENT:     Head: Normocephalic and atraumatic.  Pulmonary:     Effort: Pulmonary effort is normal.  Neurological:     General: No focal deficit present.     Mental Status: She is alert and oriented to person, place, and time.    Review of Systems  Constitutional: Negative for chills, diaphoresis and fever.  HENT: Negative for sore throat.  Respiratory: Negative for cough and shortness of breath.   Cardiovascular: Negative for chest pain and palpitations.  Gastrointestinal: Negative for constipation,  diarrhea, nausea and vomiting.  Musculoskeletal: Negative.   Neurological: Negative for dizziness, tremors and seizures.  Psychiatric/Behavioral: Positive for depression, hallucinations, substance abuse and suicidal ideas. The patient is nervous/anxious and has insomnia.    Blood pressure 138/83, pulse 79, temperature 98 F (36.7 C), temperature source Oral, resp. rate 16, height 5' 4.5" (1.638 m), weight 86.2 kg, last menstrual period 01/06/2021, SpO2 100 %. Body mass index is 32.11 kg/m.   COGNITIVE FEATURES THAT CONTRIBUTE TO RISK:  Thought constriction (tunnel vision)    SUICIDE RISK:   Moderate:  Frequent suicidal ideation with limited intensity, and duration, some specificity in terms of plans, no associated intent, good self-control, limited dysphoria/symptomatology, some risk factors present, and identifiable protective factors, including available and accessible social support.  PLAN OF CARE: The patient is admitted to the 300 unit.  We will continue every 15-minute observation status.  Encouraged participation in group therapy and therapeutic milieu.  Lab results reviewed.  CMP revealed BUN <5 but otherwise WNL.  CBC revealed RBC of 3.43, MCV of 112.2 and MCH of 37.0 but otherwise WNL.  Differential was WNL.  Quantitative hCG was <5.0.  COVID test was negative.  Urine drug screen was positive for THC.  Hemoglobin A1c, lipid panel, TSH, vitamin B12 level have been ordered but not yet performed.  EKG has been ordered but not yet performed.  The patient has been started on CIWA protocol for possible alcohol withdrawal with PRN Librium 25 mg every 6 hours as needed for CIWA > 10.  Patient home meds of Bentyl 20 mg every morning and nightly, Reglan 10 mg every 8 hours as needed for nausea continued.  Outpatient omeprazole is not available on formulary and pantoprazole has been substituted.  Anticipate initiation of SSRI for depression/anxiety.  Patient will need referral to outpatient  psychiatric medication management and grief therapy or other therapist that can see her more frequently than her current therapist.  Anticipated length of stay 3 to 5 days.  I certify that inpatient services furnished can reasonably be expected to improve the patient's condition.   Arthor Captain, MD 01/08/2021, 12:30 PM

## 2021-01-08 NOTE — Progress Notes (Signed)
Adult Psychoeducational Group Note  Date:  01/08/2021 Time:  6:57 PM  Group Topic/Focus:  Self Care:   The focus of this group is to help patients understand the importance of self-care in order to improve or restore emotional, physical, spiritual, interpersonal, and financial health.  Participation Level:  Active  Participation Quality:  Appropriate  Affect:  Appropriate  Cognitive:  Appropriate  Insight: Appropriate  Engagement in Group:  Engaged  Modes of Intervention:  Activity  Additional Comments:  Pt watched a therapeutic movie with her peers.  Wynema Birch D 01/08/2021, 6:57 PM

## 2021-01-08 NOTE — [facility] Group Notes (Signed)
Type of Therapy and Topic:  Group Therapy:  Setting Goals   Participation Level:  Active   Description of Group: In this process group, patients discussed using strengths to work toward goals and address challenges.  Patients identified two positive things about themselves and one goal they were working on.  Patients were given the opportunity to share openly and support each other's plan for self-empowerment.  The group discussed the value of gratitude and were encouraged to have a daily reflection of positive characteristics or circumstances.  Patients were encouraged to identify a plan to utilize their strengths to work on current challenges and goals.   Therapeutic Goals 1. Patient will verbalize personal strengths/positive qualities and relate how these can assist with achieving desired personal goals 2. Patients will verbalize affirmation of peers plans for personal change and goal setting 3. Patients will explore the value of gratitude and positive focus as related to successful achievement of goals 4. Patients will verbalize a plan for regular reinforcement of personal positive qualities and circumstances.   Summary of Patient Progress: Patient identified the definition of goals. Patients was given the opportunity to share openly and support other group members' plan for self-empowerment. Patient verbalized personal strength and how they relate to achieving the desired goal. Patient was able to identify positive goals to work towards when she returns home.  

## 2021-01-08 NOTE — H&P (Signed)
Psychiatric Admission Assessment Adult  Patient Identification: Marie Myers  MRN:  [id number]  Date of Evaluation:  01/08/2021  Chief Complaint: Increased depressive symptoms and worsening suicidal ideation with a plan to kill herself by overdose on pills in combination with alcohol.   Principal Diagnosis: MDD (major depressive disorder), recurrent severe, without psychosis ([place])  Diagnosis:  Principal Problem:   MDD (major depressive disorder), recurrent severe, without psychosis ([place]) Active Problems:   Moderate alcohol use disorder (HCC)   Marijuana use  History of Present Illness: (Per Md's admission SRA notes): Patient's case discussed in detail with members of the treatment team.  I met with and evaluated the patient in the office on the unit today.  Marie Myers is a 30 year old female with a history of depression and anxiety who presented voluntarily and unaccompanied to [facility] emergency department on 01/06/2021 with chief complaint of increased depressive symptoms and worsening suicidal ideation with a plan to kill herself by overdose on pills in combination with alcohol.  Urine drug screen in the ED was positive for THC.  The patient reported that she had been drinking wine at night to help herself fall asleep.  She was transferred to [facility] on the evening of 01/07/2021 for further treatment and stabilization of her depressive symptoms and suicidal ideation.  During interview with me today, the patient presents with depressed mood, constricted and intermittently tearful affect and soft speech.  She reports that she has been experiencing suicidal thoughts for a period of months but they have increased in intensity and frequency over the last week.  She reports that she has been feeling more helpless, miserable, irritable and at times becomes extremely agitated and upset upset and engages in throwing stuff and cursing at her home.  She endorses symptoms of sad mood,  insomnia, decreased interest, poor motivation, decreased concentration, low energy, anxiety and suicidal ideation.  She states that she was having thoughts about taking an overdose on pills in combination with alcohol.  The patient reports recent stressors of job loss, financial stressors, issues with her mother, being away from family and multiple recent family deaths which she is grieving.  The patient states that she stopped herself from attempting suicide in sought help because of her son.  The patient experiences auditory hallucinations when she attempts to go to sleep of mumbling sounds, screams for help or sounds as though the TV is on.  She reports visual hallucinations of seeing shadows at night.  The patient denies AI, HI, PI.  She denies any history of childhood trauma.    The patient reports that she has been using alcohol on a nightly basis in an effort to help her self sleep.  She estimates that she drinks 4-5 tall cups of wine most nights and on other nights she may drink up to 6 tall cups of wine.  She denies ever experiencing any symptoms of alcohol withdrawal and denies any current symptoms of nausea vomiting, tremor, headache.  Patient also reports daily use of marijuana throughout the day and estimates she smokes 1 clip over the course of the day which helps reduce her anxiety.    Patient reports a history of 1 prior suicide attempt 3 to 4 years ago by overdose on medications in combination with alcohol.  She was admitted following that suicide attempt to [facility] for approximately 7 to 10 days.  She states she was not discharged on medication.  She currently does not take any psychiatric medications.  She has a therapist [name] whom she sees infrequently and was unable to get another appointment to see until June.  She denies any history of nonsuicidal self-injurious behavior.  The patient denies any history of periods meeting criteria for hypomania or mania.    The  patient reports that her sister has been diagnosed with bipolar disorder and her mother has some sort of mental health issues.  The patient denies any family history of suicide.  She reports that her sister has had substance issues and some of her paternal relatives have problems with alcohol.  The patient denies any history of seizure or head injury with concussion or loss of consciousness.  During our conversation the patient states that she still has passive wishes not to be alive but she states she can remain safe in the hospital.  She is interested in starting medication for depression and anxiety.  She would also like to get connected with a therapist who can help her with grief counseling.  Associated Signs/Symptoms:  Depression Symptoms:  depressed mood, insomnia, psychomotor agitation, feelings of worthlessness/guilt, hopelessness, suicidal thoughts with specific plan, anxiety,  Duration of Depression Symptoms:Greater than 2 weeks.  (Hypo) Manic Symptoms:  Irritable Mood, Labiality of Mood,  Anxiety Symptoms:  Excessive Worry,  Psychotic Symptoms:  Denies any hallucinations, delusions or paranoia.  PTSD Symptoms: NA  Total Time spent with patient: Greater than 30 minutes  Past Psychiatric History: Major depressive disorder.  Is the patient at risk to self? Yes.    Has the patient been a risk to self in the past 6 months? Yes.    Has the patient been a risk to self within the distant past? Yes.    Is the patient a risk to others? No.  Has the patient been a risk to others in the past 6 months? No.  Has the patient been a risk to others within the distant past? No.   Prior Inpatient Therapy: [facility]. Prior Outpatient Therapy: [name]: Therapist.  Alcohol Screening: 1. How often do you have a drink containing alcohol?: 4 or more times a week 2. How many drinks containing alcohol do you have on a typical day when you are drinking?: 10 or more 3. How  often do you have six or more drinks on one occasion?: Daily or almost daily AUDIT-C Score: 12 4. How often during the last year have you found that you were not able to stop drinking once you had started?: Daily or almost daily 5. How often during the last year have you failed to do what was normally expected from you because of drinking?: Daily or almost daily 6. How often during the last year have you needed a first drink in the morning to get yourself going after a heavy drinking session?: Daily or almost daily 7. How often during the last year have you had a feeling of guilt of remorse after drinking?: Daily or almost daily 8. How often during the last year have you been unable to remember what happened the night before because you had been drinking?: Daily or almost daily 9. Have you or someone else been injured as a result of your drinking?: No 10. Has a relative or friend or a doctor or another health worker been concerned about your drinking or suggested you cut down?: No Alcohol Use Disorder Identification Test Final Score (AUDIT): 32 Alcohol Brief Interventions/Follow-up: Alcohol education/Brief advice  Substance Abuse History in the last 12 months:  Yes.  Consequences of Substance Abuse: Discussed witg patient during this admission evaluation. Medical Consequences:  Liver damage, Possible death by overdose Legal Consequences:  Arrests, jail time, Loss of driving privilege. Family Consequences:  Family discord, divorce and or separation.  Previous Psychotropic Medications: No   Psychological Evaluations: No   Past Medical History:  Past Medical History:  Diagnosis Date  . Marijuana use 01/08/2021  . Moderate alcohol use disorder ([place]) 01/08/2021    Past Surgical History:  Procedure Laterality Date  . CHOLECYSTECTOMY    . GASTRIC BYPASS    . LAPAROSCOPY N/A 08/11/2019   Procedure: LAPAROSCOPY DIAGNOSTIC;  Surgeon: Erroll Luna, MD;  Location: [facility];  Service: General;   Laterality: N/A;   Family History: History reviewed. No pertinent family history.  Family Psychiatric  History: Bipolar disorder: Sister.                                                      Major depressive disorder. Tobacco Screening:   Social History:  Social History   Substance and Sexual Activity  Alcohol Use Yes   Comment: occasionally     Social History   Substance and Sexual Activity  Drug Use Never    Additional Social History:  Allergies:   Allergies  Allergen Reactions  . Zithromax [Azithromycin] Hives   Lab Results:  Results for orders placed or performed during the hospital encounter of 01/06/21 (from the past 48 hour(s))  Urine rapid drug screen (hosp performed)     Status: Abnormal   Collection Time: 01/06/21  3:23 PM  Result Value Ref Range   Opiates NONE DETECTED NONE DETECTED   Cocaine NONE DETECTED NONE DETECTED   Benzodiazepines NONE DETECTED NONE DETECTED   Amphetamines NONE DETECTED NONE DETECTED   Tetrahydrocannabinol POSITIVE (A) NONE DETECTED   Barbiturates NONE DETECTED NONE DETECTED    Comment: (NOTE) DRUG SCREEN FOR MEDICAL PURPOSES ONLY.  IF CONFIRMATION IS NEEDED FOR ANY PURPOSE, NOTIFY LAB WITHIN 5 DAYS.  LOWEST DETECTABLE LIMITS FOR URINE DRUG SCREEN Drug Class                     Cutoff (ng/mL) Amphetamine and metabolites    1000 Barbiturate and metabolites    200 Benzodiazepine                 048 Tricyclics and metabolites     300 Opiates and metabolites        300 Cocaine and metabolites        300 THC                            50 Performed at [facility], [place] [address], [place], [place] [address]    Blood Alcohol level:  Lab Results  Component Value Date   ETH <10 94/50/3888   Metabolic Disorder Labs:  No results found for: HGBA1C, MPG No results found for: PROLACTIN No results found for: CHOL, TRIG, HDL, CHOLHDL, VLDL, LDLCALC  Current Medications: Current Facility-Administered Medications  Medication  Dose Route Frequency Provider Last Rate Last Admin  . acetaminophen (TYLENOL) tablet 650 mg  650 mg Oral Q6H PRN Sharma Covert, MD   650 mg at 01/08/21 0930  . alum & mag hydroxide-simeth (MAALOX/MYLANTA) 200-200-20 MG/5ML suspension 30  mL  30 mL Oral Q4H PRN Sharma Covert, MD      . chlordiazePOXIDE (LIBRIUM) capsule 25 mg  25 mg Oral Q6H PRN Sharma Covert, MD      . dicyclomine (BENTYL) tablet 20 mg  20 mg Oral BID AC & HS Sharma Covert, MD   20 mg at 01/08/21 0930  . folic acid (FOLVITE) tablet 1 mg  1 mg Oral Daily Sharma Covert, MD   1 mg at 01/08/21 0930  . hydrOXYzine (ATARAX/VISTARIL) tablet 25 mg  25 mg Oral Q6H PRN Sharma Covert, MD   25 mg at 01/08/21 0930  . loperamide (IMODIUM) capsule 2-4 mg  2-4 mg Oral PRN Sharma Covert, MD      . magnesium hydroxide (MILK OF MAGNESIA) suspension 30 mL  30 mL Oral Daily PRN Sharma Covert, MD      . metoCLOPramide (REGLAN) tablet 5 mg  5 mg Oral Q8H PRN Arthor Captain, MD      . multivitamin with minerals tablet 1 tablet  1 tablet Oral Daily Sharma Covert, MD   1 tablet at 01/08/21 0930  . ondansetron (ZOFRAN-ODT) disintegrating tablet 8 mg  8 mg Oral Q6H PRN Sharma Covert, MD      . pantoprazole (PROTONIX) EC tablet 80 mg  80 mg Oral Daily Sharma Covert, MD   80 mg at 01/08/21 0935  . thiamine (B-1) injection 100 mg  100 mg Intramuscular Once Sharma Covert, MD      . thiamine tablet 100 mg  100 mg Oral Daily Sharma Covert, MD   100 mg at 01/08/21 0930  . traZODone (DESYREL) tablet 50 mg  50 mg Oral QHS PRN Sharma Covert, MD   50 mg at 01/07/21 2102   PTA Medications: Medications Prior to Admission  Medication Sig Dispense Refill Last Dose  . acetaminophen (TYLENOL) 500 MG tablet Take 2 tablets (1,000 mg total) by mouth every 8 (eight) hours as needed for moderate pain.  0   . amitriptyline (ELAVIL) 100 MG tablet Take 100 mg by mouth at bedtime as needed for sleep.     [name]  dicyclomine (BENTYL) 20 MG tablet Take 20 mg by mouth in the morning and at bedtime.     . diphenhydrAMINE (BENADRYL) 25 MG tablet Take 25 mg by mouth every 6 (six) hours as needed for itching or allergies (headaches).     . metoCLOPramide (REGLAN) 10 MG tablet Take 0.5-1 tablets (5-10 mg total) by mouth every 8 (eight) hours as needed for nausea. 30 tablet 0   . omeprazole (PRILOSEC) 20 MG capsule Take 40 mg by mouth daily.      Musculoskeletal: Strength & Muscle Tone: within normal limits Gait & Station: normal Patient leans: N/A  Psychiatric Specialty Exam:  Presentation  General Appearance: Casual; Disheveled  Eye Contact:Fair  Speech:Clear and Coherent; Normal Rate  Speech Volume:Normal  Handedness:Right   Mood and Affect  Mood:Depressed; Dysphoric; Irritable; Labile; Hopeless; Worthless  Affect:Congruent; Geophysical data processor Processes:Coherent  Duration of Psychotic Symptoms: No data recorded Past Diagnosis of Schizophrenia or Psychoactive disorder: No data recorded Descriptions of Associations:Intact  Orientation:Full (Time, Place and Person)  Thought Content:Logical; Rumination  Hallucinations:Hallucinations: None Description of Auditory Hallucinations: NA Description of Visual Hallucinations: NA  Ideas of Reference:None  Suicidal Thoughts:Suicidal Thoughts: Yes, Passive SI Active Intent and/or Plan: Without Intent; Without Plan; Without Means to Carry Out; Without Access  to Means SI Passive Intent and/or Plan: Without Intent; Without Means to Carry Out; Without Access to Means; Without Plan  Homicidal Thoughts:Homicidal Thoughts: No  Sensorium  Memory:Recent Good; Remote Good; Immediate Good  Judgment:Poor  Insight:Lacking  Executive Functions  Concentration:Fair  Attention Span:Fair  [place]  Psychomotor Activity  Psychomotor Activity:Psychomotor Activity: Normal  Assets   Assets:Communication Skills; Desire for Improvement; Resilience  Sleep  Sleep:Sleep: Poor Number of Hours of Sleep: 6.75  Physical Exam: Physical Exam Vitals and nursing note reviewed.  HENT:     Head: Normocephalic.     Nose: Nose normal.     Mouth/Throat:     Pharynx: Oropharynx is clear.  Eyes:     Pupils: Pupils are equal, round, and reactive to light.  Cardiovascular:     Rate and Rhythm: Normal rate.     Pulses: Normal pulses.  Pulmonary:     Effort: Pulmonary effort is normal.  Genitourinary:    Comments: Deferred Musculoskeletal:        General: Normal range of motion.     Cervical back: Normal range of motion.  Skin:    General: Skin is warm and dry.  Neurological:     General: No focal deficit present.     Mental Status: She is alert and oriented to person, place, and time.    Review of Systems  Constitutional: Negative for chills and fever.  HENT: Negative.   Eyes: Negative.   Respiratory: Negative for cough, shortness of breath and wheezing.   Cardiovascular: Negative for chest pain and palpitations.  Gastrointestinal: Positive for heartburn, nausea and vomiting. Negative for blood in stool, constipation, diarrhea and melena.       Hx. Gastric bypass  Genitourinary: Positive for hematuria (small amt).  Musculoskeletal: Negative for joint pain and myalgias.  Skin: Negative.   Neurological: Negative for dizziness, tingling, tremors, sensory change, speech change, focal weakness, seizures, loss of consciousness, weakness and headaches.  Endo/Heme/Allergies:       Allergies: Azithromax  Psychiatric/Behavioral: Positive for depression and substance abuse (UDS (+) for THC). Negative for hallucinations, memory loss and suicidal ideas. The patient is nervous/anxious and has insomnia.    Blood pressure 138/83, pulse 79, temperature 98 F (36.7 C), temperature source Oral, resp. rate 16, height 5' 4.5" (1.638 m), weight 86.2 kg, last menstrual period 01/06/2021,  SpO2 100 %. Body mass index is 32.11 kg/m.  Treatment Plan Summary: Daily contact with patient to assess and evaluate symptoms and progress in treatment and Medication management.   Treatment Plan/Recommendations: 1. Admit for crisis management and stabilization, estimated length of stay 3-5 days.  2. Medication management to reduce current symptoms to base line and improve the patient's overall level of functioning: See [facility] for plan of care. 3. Treat health problems as indicated.  4. Develop treatment plan to decrease risk of relapse upon discharge and the need for readmission.  5. Psycho-social education regarding relapse prevention and self care.  6. Health care follow up as needed for medical problems.  7. Review, reconcile, and reinstate any pertinent home medications for other health issues where appropriate. 8. Call for consults with hospitalist for any additional specialty patient care services as needed.  Observation Level/Precautions:  15 minute checks  Laboratory:  Per ED, will obtain Lipid panel, HGBa1c, TSH  Psychotherapy: Group sessions  Medications: See [facility]    Consultations: As needed.  Discharge Concerns: Safety, mood stability   Estimated LOS:  3-5 days  Other:  Admit to the 300-hall     Physician Treatment Plan for Primary Diagnosis: MDD (major depressive disorder), recurrent severe, without psychosis ([place])  Long Term Goal(s): Improvement in symptoms so as ready for discharge  Short Term Goals: Ability to identify changes in lifestyle to reduce recurrence of condition will improve, Ability to verbalize feelings will improve and Ability to disclose and discuss suicidal ideas  Physician Treatment Plan for Secondary Diagnosis: Principal Problem:   MDD (major depressive disorder), recurrent severe, without psychosis ([place]) Active Problems:   Moderate alcohol use disorder (HCC)   Marijuana use  Long Term Goal(s): Improvement in symptoms so as ready for  discharge  Short Term Goals: Ability to demonstrate self-control will improve, Ability to identify and develop effective coping behaviors will improve, Compliance with prescribed medications will improve and Ability to identify triggers associated with substance abuse/mental health issues will improve  I certify that inpatient services furnished can reasonably be expected to improve the patient's condition.    Lindell Spar, NP, PMHNP, FNP-BC 5/18/20222:49 PM

## 2021-01-08 NOTE — Progress Notes (Signed)
Pt was irritable on approach and was triggered when she heard the words "alcohol withdrawal."  Pt has no insight into her drinking  And said, "yes I do drink everyday so I can fall asleep, but I'm no different from anyone else."  RN attempted to provide education about drinking alcohol, but pt did not want to hear any of the education. Pt was able to calm down with redirection and reassurance.  RN will continue to monitor and provide support as needed.

## 2021-01-08 NOTE — Progress Notes (Signed)
[facility] Group Notes:  (Nursing/MHT/Case Management/Adjunct)  Date:  01/08/2021  Time:  2015 Type of Therapy:  wrap up group  Participation Level:  Active  Participation Quality:  Appropriate, Attentive, Sharing and Supportive  Affect:  Irritable  Cognitive:  Alert  Insight:  Improving  Engagement in Group:  Engaged  Modes of Intervention:  Clarification, Education and Support  Summary of Progress/Problems: Positive thinking and positive change were discussed.   Marcille Buffy 01/08/2021, 10:43 PM

## 2021-01-08 NOTE — Tx Team (Signed)
Interdisciplinary Treatment and Diagnostic Plan Update  01/08/2021 Time of Session: 9:20am  Marie Myers MRN: [id number]  Principal Diagnosis: <principal problem not specified>  Secondary Diagnoses: Active Problems:   MDD (major depressive disorder), recurrent severe, without psychosis ([place])   Current Medications:  Current Facility-Administered Medications  Medication Dose Route Frequency Provider Last Rate Last Admin  . acetaminophen (TYLENOL) tablet 650 mg  650 mg Oral Q6H PRN Sharma Covert, MD   650 mg at 01/08/21 0930  . alum & mag hydroxide-simeth (MAALOX/MYLANTA) 200-200-20 MG/5ML suspension 30 mL  30 mL Oral Q4H PRN Sharma Covert, MD      . chlordiazePOXIDE (LIBRIUM) capsule 25 mg  25 mg Oral Q6H PRN Sharma Covert, MD      . dicyclomine (BENTYL) tablet 20 mg  20 mg Oral BID AC & HS Sharma Covert, MD   20 mg at 01/08/21 0930  . folic acid (FOLVITE) tablet 1 mg  1 mg Oral Daily Sharma Covert, MD   1 mg at 01/08/21 0930  . hydrOXYzine (ATARAX/VISTARIL) tablet 25 mg  25 mg Oral Q6H PRN Sharma Covert, MD   25 mg at 01/08/21 0930  . loperamide (IMODIUM) capsule 2-4 mg  2-4 mg Oral PRN Sharma Covert, MD      . magnesium hydroxide (MILK OF MAGNESIA) suspension 30 mL  30 mL Oral Daily PRN Sharma Covert, MD      . metoCLOPramide (REGLAN) tablet 5-10 mg  5-10 mg Oral Q8H PRN Sharma Covert, MD      . multivitamin with minerals tablet 1 tablet  1 tablet Oral Daily Sharma Covert, MD   1 tablet at 01/08/21 0930  . ondansetron (ZOFRAN-ODT) disintegrating tablet 8 mg  8 mg Oral Q6H PRN Sharma Covert, MD      . pantoprazole (PROTONIX) EC tablet 80 mg  80 mg Oral Daily Sharma Covert, MD   80 mg at 01/08/21 0935  . thiamine (B-1) injection 100 mg  100 mg Intramuscular Once Sharma Covert, MD      . thiamine tablet 100 mg  100 mg Oral Daily Sharma Covert, MD   100 mg at 01/08/21 0930  . traZODone (DESYREL) tablet 50 mg  50 mg Oral  QHS PRN Sharma Covert, MD   50 mg at 01/07/21 2102   PTA Medications: Medications Prior to Admission  Medication Sig Dispense Refill Last Dose  . acetaminophen (TYLENOL) 500 MG tablet Take 2 tablets (1,000 mg total) by mouth every 8 (eight) hours as needed for moderate pain.  0   . amitriptyline (ELAVIL) 100 MG tablet Take 100 mg by mouth at bedtime as needed for sleep.     [name] dicyclomine (BENTYL) 20 MG tablet Take 20 mg by mouth in the morning and at bedtime.     . diphenhydrAMINE (BENADRYL) 25 MG tablet Take 25 mg by mouth every 6 (six) hours as needed for itching or allergies (headaches).     . metoCLOPramide (REGLAN) 10 MG tablet Take 0.5-1 tablets (5-10 mg total) by mouth every 8 (eight) hours as needed for nausea. 30 tablet 0   . omeprazole (PRILOSEC) 20 MG capsule Take 40 mg by mouth daily.       Patient Stressors: Educational concerns Financial difficulties Occupational concerns Substance abuse  Patient Strengths: Ability for insight Average or above average intelligence Capable of independent living Curator fund of knowledge Motivation for treatment/growth Physical Health Supportive family/friends Work  skills  Treatment Modalities: Medication Management, Group therapy, Case management,  1 to 1 session with clinician, Psychoeducation, Recreational therapy.   Physician Treatment Plan for Primary Diagnosis: <principal problem not specified> Long Term Goal(s): Improvement in symptoms so as ready for discharge Improvement in symptoms so as ready for discharge   Short Term Goals: Ability to identify changes in lifestyle to reduce recurrence of condition will improve Ability to verbalize feelings will improve Ability to disclose and discuss suicidal ideas Ability to demonstrate self-control will improve Ability to identify and develop effective coping behaviors will improve Compliance with prescribed medications will improve Ability to identify  triggers associated with substance abuse/mental health issues will improve  Medication Management: Evaluate patient's response, side effects, and tolerance of medication regimen.  Therapeutic Interventions: 1 to 1 sessions, Unit Group sessions and Medication administration.  Evaluation of Outcomes: Not Met  Physician Treatment Plan for Secondary Diagnosis: Active Problems:   MDD (major depressive disorder), recurrent severe, without psychosis ([place])  Long Term Goal(s): Improvement in symptoms so as ready for discharge Improvement in symptoms so as ready for discharge   Short Term Goals: Ability to identify changes in lifestyle to reduce recurrence of condition will improve Ability to verbalize feelings will improve Ability to disclose and discuss suicidal ideas Ability to demonstrate self-control will improve Ability to identify and develop effective coping behaviors will improve Compliance with prescribed medications will improve Ability to identify triggers associated with substance abuse/mental health issues will improve     Medication Management: Evaluate patient's response, side effects, and tolerance of medication regimen.  Therapeutic Interventions: 1 to 1 sessions, Unit Group sessions and Medication administration.  Evaluation of Outcomes: Not Met   RN Treatment Plan for Primary Diagnosis: <principal problem not specified> Long Term Goal(s): Knowledge of disease and therapeutic regimen to maintain health will improve  Short Term Goals: Ability to remain free from injury will improve, Ability to demonstrate self-control, Ability to participate in decision making will improve, Ability to verbalize feelings will improve, Ability to disclose and discuss suicidal ideas and Ability to identify and develop effective coping behaviors will improve  Medication Management: RN will administer medications as ordered by provider, will assess and evaluate patient's response and provide  education to patient for prescribed medication. RN will report any adverse and/or side effects to prescribing provider.  Therapeutic Interventions: 1 on 1 counseling sessions, Psychoeducation, Medication administration, Evaluate responses to treatment, Monitor vital signs and CBGs as ordered, Perform/monitor CIWA, COWS, AIMS and Fall Risk screenings as ordered, Perform wound care treatments as ordered.  Evaluation of Outcomes: Not Met   LCSW Treatment Plan for Primary Diagnosis: <principal problem not specified> Long Term Goal(s): Safe transition to appropriate next level of care at discharge, Engage patient in therapeutic group addressing interpersonal concerns.  Short Term Goals: Engage patient in aftercare planning with referrals and resources, Increase social support, Increase emotional regulation, Facilitate acceptance of mental health diagnosis and concerns, Identify triggers associated with mental health/substance abuse issues and Increase skills for wellness and recovery  Therapeutic Interventions: Assess for all discharge needs, 1 to 1 time with Social worker, Explore available resources and support systems, Assess for adequacy in community support network, Educate family and significant other(s) on suicide prevention, Complete Psychosocial Assessment, Interpersonal group therapy.  Evaluation of Outcomes: Not Met   Progress in Treatment: Attending groups: Yes. Participating in groups: Yes. Taking medication as prescribed: Yes. Toleration medication: Yes. Family/Significant other contact made: Yes, individual(s) contacted:  If consents are provided  Patient understands diagnosis: No. Discussing patient identified problems/goals with staff: Yes. Medical problems stabilized or resolved: Yes. Denies suicidal/homicidal ideation: Yes. Issues/concerns per patient self-inventory: No.   New problem(s) identified: No, Describe:  None   New Short Term/Long Term Goal(s): medication  stabilization, elimination of SI thoughts, development of comprehensive mental wellness plan.   Patient Goals: "To get my mental health right and focus on myself"  Discharge Plan or Barriers: Patient recently admitted. CSW will continue to follow and assess for appropriate referrals and possible discharge planning.   Reason for Continuation of Hospitalization: Depression Medication stabilization Suicidal ideation  Estimated Length of Stay: 3 to 5 days   Attendees: Patient: Marie Myers  01/08/2021   Physician: Myles Lipps, MD  01/08/2021   Nursing:  01/08/2021   RN Care Manager: 01/08/2021   Social Worker: Verdis Frederickson, [place]  01/08/2021   Recreational Therapist:  01/08/2021   Other:  01/08/2021   Other:  01/08/2021   Other: 01/08/2021     Scribe for Treatment Team: Darleen Crocker, [place] 01/08/2021 11:31 AM

## 2021-01-08 NOTE — Progress Notes (Signed)
Recreation Therapy Notes  Date: 5.18.22 Time: 0950 Location: [address] Dayroom  Group Topic: Stress Management  Goal Area(s) Addresses:  Patient will identify positive stress management techniques. Patient will identify benefits of using stress management post d/c.  Behavioral Response:  Attentive  Intervention: Stress Management  Activity: Meditation.  LRT played a meditation that focused on taking on the characteristics of a mountain.  The meditation focused on being unmovable in the face of changes/obstacles that occur in life.     Education:  Stress Management, Discharge Planning.   Education Outcome: Acknowledges Education  Clinical Observations/Feedback: Pt attended and participated in group.    Caroll Rancher, LRT/CTRS         Caroll Rancher A 01/08/2021 10:52 AM

## 2021-01-09 ENCOUNTER — Telehealth (HOSPITAL_COMMUNITY): Payer: Self-pay | Admitting: Psychiatry

## 2021-01-09 MED ORDER — VITAMIN B-12 1000 MCG PO TABS
1000.0000 ug | ORAL_TABLET | Freq: Every day | ORAL | Status: DC
  Administered 2021-01-09 – 2021-01-13 (×5): 1000 ug via ORAL
  Filled 2021-01-09 (×7): qty 1

## 2021-01-09 MED ORDER — HYDROXYZINE HCL 50 MG PO TABS
50.0000 mg | ORAL_TABLET | Freq: Four times a day (QID) | ORAL | Status: DC | PRN
  Administered 2021-01-10 – 2021-01-13 (×10): 50 mg via ORAL
  Filled 2021-01-09 (×10): qty 1

## 2021-01-09 MED ORDER — TRAZODONE HCL 100 MG PO TABS
100.0000 mg | ORAL_TABLET | Freq: Every day | ORAL | Status: DC
  Administered 2021-01-09 – 2021-01-12 (×4): 100 mg via ORAL
  Filled 2021-01-09 (×6): qty 1

## 2021-01-09 MED ORDER — LISINOPRIL 5 MG PO TABS
5.0000 mg | ORAL_TABLET | Freq: Every day | ORAL | Status: DC
  Administered 2021-01-10 – 2021-01-13 (×4): 5 mg via ORAL
  Filled 2021-01-09 (×7): qty 1

## 2021-01-09 MED ORDER — GABAPENTIN 100 MG PO CAPS
100.0000 mg | ORAL_CAPSULE | Freq: Three times a day (TID) | ORAL | Status: DC
  Administered 2021-01-09 – 2021-01-12 (×10): 100 mg via ORAL
  Filled 2021-01-09 (×15): qty 1

## 2021-01-09 MED ORDER — HYDROXYZINE HCL 50 MG PO TABS
50.0000 mg | ORAL_TABLET | ORAL | Status: DC | PRN
  Administered 2021-01-09: 50 mg via ORAL
  Filled 2021-01-09: qty 1

## 2021-01-09 MED ORDER — OLANZAPINE 5 MG PO TABS
5.0000 mg | ORAL_TABLET | Freq: Every day | ORAL | Status: DC
  Administered 2021-01-09 – 2021-01-12 (×4): 5 mg via ORAL
  Filled 2021-01-09: qty 1
  Filled 2021-01-09: qty 2
  Filled 2021-01-09 (×5): qty 1

## 2021-01-09 MED ORDER — LISINOPRIL 10 MG PO TABS
10.0000 mg | ORAL_TABLET | Freq: Once | ORAL | Status: AC
  Administered 2021-01-09: 10 mg via ORAL
  Filled 2021-01-09: qty 1
  Filled 2021-01-09: qty 2

## 2021-01-09 MED ORDER — SERTRALINE HCL 25 MG PO TABS
75.0000 mg | ORAL_TABLET | Freq: Every day | ORAL | Status: DC
  Administered 2021-01-10 – 2021-01-12 (×3): 75 mg via ORAL
  Filled 2021-01-09 (×6): qty 3

## 2021-01-09 NOTE — Telephone Encounter (Signed)
D:  [name], LCSW [facility]) called to refer pt to MH-IOP per pt's request d/t her job schedule.  A:  Scheduled pt to start on 01-21-21 @ 9 a.m, d/t pt not being released until 01-16-21.  Case manager will orient pt before she starts MH-IOP.
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
## 2021-01-09 NOTE — Progress Notes (Signed)
   01/09/21 0100  Psych Admission Type (Psych Patients Only)  Admission Status Voluntary  Psychosocial Assessment  Patient Complaints Anxiety;Anger;Irritability;Insomnia  Eye Contact Fair  Facial Expression Anxious;Pensive  Affect Anxious;Appropriate to circumstance;Depressed;Irritable;Labile  Speech Logical/coherent;Argumentative  Interaction Assertive;Intrusive  Motor Activity Fidgety  Appearance/Hygiene Unremarkable  Behavior Characteristics Irritable;Anxious  Mood Depressed;Anxious  Thought Process  Coherency WDL  Content Blaming others  Delusions None reported or observed  Perception Hallucinations  Hallucination Auditory  Judgment Poor  Confusion None  Danger to Self  Current suicidal ideation? Passive  Self-Injurious Behavior No self-injurious ideation or behavior indicators observed or expressed   Agreement Not to Harm Self Yes  Description of Agreement agrees to notify staff immediately for any thoughts of hurting herself or anyone else  Danger to Others  Danger to Others None reported or observed   Patient refused hs [place] stated that she takes it as needed. C/O aniety Atarax 25 mg PO given at 2141, and  Trazodone 50 mg for sleep reported little effect. Patient denies SI/HI/A/VH support and encouragement provided. Q15 minutes safety checks ongoing without self harm gestures.

## 2021-01-09 NOTE — Progress Notes (Signed)
   01/09/21 1701  Vital Signs  Temp 98.5 F (36.9 C)  Temp Source Oral  Pulse Rate 72  Pulse Rate Source Dinamap  BP (!) 137/101  BP Location Left Arm  BP Method Automatic  Patient Position (if appropriate) Sitting  Oxygen Therapy  SpO2 100 %   D: Patient admits to some passive SI but denies HI. Pt. Denies AVH. Rated anxiety 9/10 and depression 10/10. Pt. Out in open areas and was social with peers. A:  Patient took scheduled medicine. Pt took 50 mg of vistaril for anxiety but stated that it did not help.   Support and encouragement provided Routine safety checks conducted every 15 minutes. Patient  Informed to notify staff with any concerns.   R: Pt. Verbally contracts for safety. Safety maintained.

## 2021-01-09 NOTE — Progress Notes (Addendum)
[facility] MD Progress Note  01/09/2021 5:02 PM Marie Myers  MRN:  [id number]  Subjective: Marie Myers reports, "I feel so anxious & jittery this morning. I don't think that the medicines I'm on now are working. I did not sleep well last night. I still feel like hurting myself, but I know I'm here to get help for me so I can take care of my son".  Objective: Marie Myers a 30 year old female with a history of depression and anxiety who presented voluntarily and unaccompanied to [facility] emergency department on 01/06/2021 with chief complaint of increased depressive symptoms and worsening suicidal ideation with a plan to kill herself by overdose on pills in combination with alcohol. Urine drug screen in the ED was positive for THC. The patient reported that she had been drinking wine at night to help herself fall asleep.  Daily notes: Marie Myers is seen, chart reviewed. The chart findings discussed with the treatment. She presents alert, oriented x 3 & aware of situation. She is visible on the unit, attending group sessions. She reports today that she is feeling anxious & jittery. She says she did not think that her medications are effective so far. She does not think that the Trazodone is helping her sleep at night. She continues to endorse passive SI without any plan or intent to hurt herself. She is able to contract for safety. Marie Myers is informed & explained that she just started her medications yesterday that she needed sometime to allow her medications to get into her system to really know if they are helping or not. However, she is in agreement to increase her Sertraline to 75 mg daily, increased Vistaril to 50 mg po qid prn for anxiety, increased Trazodone to 100 mg Q hs for insomnia. Added gabapentin 100 mg po tid for agitation/substance withdrawal syndrome. Patient is instructed & encouraged to be patient as her medications continue to get into her system. She is able to verbally contract  for safety on the unit while denying any plans or intent to hurt herself here. Her medications has been addressed & dose adjustment done to meet her need. Initiated B-12 supplement.  Principal Problem: MDD (major depressive disorder), recurrent severe, without psychosis (HCC)  Diagnosis: Principal Problem:   MDD (major depressive disorder), recurrent severe, without psychosis (HCC) Active Problems:   Moderate alcohol use disorder (HCC)   Marijuana use  Total Time spent with patient: 25 minutes  Past Psychiatric History: See H&P  Past Medical History:  Past Medical History:  Diagnosis Date  . Marijuana use 01/08/2021  . Moderate alcohol use disorder (HCC) 01/08/2021    Past Surgical History:  Procedure Laterality Date  . CHOLECYSTECTOMY    . GASTRIC BYPASS    . LAPAROSCOPY N/A 08/11/2019   Procedure: LAPAROSCOPY DIAGNOSTIC;  Surgeon: Harriette Bouillon, MD;  Location: [facility] OR;  Service: General;  Laterality: N/A;   Family History: History reviewed. No pertinent family history.  Family Psychiatric  History: See H&P  Social History:  Social History   Substance and Sexual Activity  Alcohol Use Yes   Comment: occasionally     Social History   Substance and Sexual Activity  Drug Use Never    Social History   Socioeconomic History  . Marital status: Single    Spouse name: Not on file  . Number of children: Not on file  . Years of education: Not on file  . Highest education level: Not on file  Occupational History  . Not on file  Tobacco Use  . Smoking status: Never Smoker  . Smokeless tobacco: Never Used  Substance and Sexual Activity  . Alcohol use: Yes    Comment: occasionally  . Drug use: Never  . Sexual activity: Not on file  Other Topics Concern  . Not on file  Social History Narrative  . Not on file   Social Determinants of Health   Financial Resource Strain: Not on file  Food Insecurity: Not on file  Transportation Needs: Not on file  Physical Activity:  Not on file  Stress: Not on file  Social Connections: Not on file   Additional Social History:   Sleep: Fair  Appetite:  Good  Current Medications: Current Facility-Administered Medications  Medication Dose Route Frequency Provider Last Rate Last Admin  . acetaminophen (TYLENOL) tablet 650 mg  650 mg Oral Q6H PRN Antonieta Pert, MD   650 mg at 01/09/21 0802  . alum & mag hydroxide-simeth (MAALOX/MYLANTA) 200-200-20 MG/5ML suspension 30 mL  30 mL Oral Q4H PRN Antonieta Pert, MD      . ARIPiprazole (ABILIFY) tablet 5 mg  5 mg Oral Daily Johathon Overturf I, NP   5 mg at 01/09/21 0800  . chlordiazePOXIDE (LIBRIUM) capsule 25 mg  25 mg Oral Q6H PRN Antonieta Pert, MD      . dicyclomine (BENTYL) tablet 20 mg  20 mg Oral BID AC & HS Antonieta Pert, MD   20 mg at 01/09/21 0759  . folic acid (FOLVITE) tablet 1 mg  1 mg Oral Daily Antonieta Pert, MD   1 mg at 01/09/21 0758  . gabapentin (NEURONTIN) capsule 100 mg  100 mg Oral TID Armandina Stammer I, NP   100 mg at 01/09/21 1358  . hydrOXYzine (ATARAX/VISTARIL) tablet 50 mg  50 mg Oral Q4H PRN Armandina Stammer I, NP   50 mg at 01/09/21 1509  . loperamide (IMODIUM) capsule 2-4 mg  2-4 mg Oral PRN Antonieta Pert, MD      . magnesium hydroxide (MILK OF MAGNESIA) suspension 30 mL  30 mL Oral Daily PRN Antonieta Pert, MD      . metoCLOPramide (REGLAN) tablet 5 mg  5 mg Oral Q8H PRN Claudie Revering, MD      . multivitamin with minerals tablet 1 tablet  1 tablet Oral Daily Antonieta Pert, MD   1 tablet at 01/09/21 0759  . ondansetron (ZOFRAN-ODT) disintegrating tablet 8 mg  8 mg Oral Q6H PRN Antonieta Pert, MD      . pantoprazole (PROTONIX) EC tablet 80 mg  80 mg Oral Daily Antonieta Pert, MD   80 mg at 01/09/21 0759  . [START ON 01/10/2021] sertraline (ZOLOFT) tablet 75 mg  75 mg Oral Daily Mourad Cwikla I, NP      . thiamine tablet 100 mg  100 mg Oral Daily Antonieta Pert, MD   100 mg at 01/08/21 0930  . traZODone (DESYREL)  tablet 100 mg  100 mg Oral QHS Tuana Hoheisel I, NP      . vitamin B-12 (CYANOCOBALAMIN) tablet 1,000 mcg  1,000 mcg Oral Daily Armandina Stammer I, NP   1,000 mcg at 01/09/21 1359    Lab Results:  Results for orders placed or performed during the hospital encounter of 01/07/21 (from the past 48 hour(s))  TSH     Status: None   Collection Time: 01/08/21  6:43 PM  Result Value Ref Range   TSH 1.087 0.350 - 4.500 uIU/mL  Comment: Performed by a 3rd Generation assay with a functional sensitivity of <=0.01 uIU/mL. Performed at [facility], [address], [place], [place] [address]   Hemoglobin A1c     Status: Abnormal   Collection Time: 01/08/21  6:43 PM  Result Value Ref Range   Hgb A1c MFr Bld 4.7 (L) 4.8 - 5.6 %    Comment: (NOTE) Pre diabetes:          5.7%-6.4%  Diabetes:              >6.4%  Glycemic control for   <7.0% adults with diabetes    Mean Plasma Glucose 88.19 mg/dL    Comment: Performed at [facility], [address], [place], [place] [address]  Lipid panel     Status: None   Collection Time: 01/08/21  6:43 PM  Result Value Ref Range   Cholesterol 166 0 - 200 mg/dL   Triglycerides 46 <098 mg/dL   HDL 119 >14 mg/dL   Total CHOL/HDL Ratio 1.4 RATIO   VLDL 9 0 - 40 mg/dL   LDL Cholesterol 38 0 - 99 mg/dL    Comment:        Total Cholesterol/HDL:CHD Risk Coronary Heart Disease Risk Table                     Men   Women  1/2 Average Risk   3.4   3.3  Average Risk       5.0   4.4  2 X Average Risk   9.6   7.1  3 X Average Risk  23.4   11.0        Use the calculated Patient Ratio above and the CHD Risk Table to determine the patient's CHD Risk.        ATP III CLASSIFICATION (LDL):  <100     mg/dL   Optimal  782-956  mg/dL   Near or Above                    Optimal  130-159  mg/dL   Borderline  213-086  mg/dL   High  >578     mg/dL   Very High Performed at [facility], [address], [place], [place] [address]    Vitamin B12     Status: Abnormal   Collection Time: 01/08/21  6:43 PM  Result Value Ref Range   Vitamin B-12 130 (L) 180 - 914 pg/mL    Comment: (NOTE) This assay is not validated for testing neonatal or myeloproliferative syndrome specimens for Vitamin B12 levels. Performed at [facility], [address] [place], [place] [address]     Blood Alcohol level:  Lab Results  Component Value Date   ETH <10 01/06/2021    Metabolic Disorder Labs: Lab Results  Component Value Date   HGBA1C 4.7 (L) 01/08/2021   MPG 88.19 01/08/2021   No results found for: PROLACTIN Lab Results  Component Value Date   CHOL 166 01/08/2021   TRIG 46 01/08/2021   HDL 119 01/08/2021   CHOLHDL 1.4 01/08/2021   VLDL 9 01/08/2021   LDLCALC 38 01/08/2021    Physical Findings: AIMS: Facial and Oral Movements Muscles of Facial Expression: None, normal Lips and Perioral Area: None, normal Jaw: None, normal Tongue: None, normal,Extremity Movements Upper (arms, wrists, hands, fingers): None, normal Lower (legs, knees, ankles, toes): None, normal, Trunk Movements Neck, shoulders, hips: None, normal, Overall Severity  Severity of abnormal movements (highest score from questions above): None, normal Incapacitation due to abnormal movements: None, normal Patient's awareness of abnormal movements (rate only patient's report): No Awareness, Dental Status Current problems with teeth and/or dentures?: No Does patient usually wear dentures?: No  CIWA:  CIWA-Ar Total: 3 COWS:     Musculoskeletal: Strength & Muscle Tone: within normal limits Gait & Station: normal Patient leans: N/A  Psychiatric Specialty Exam:  Presentation  General Appearance: Casual; Disheveled  Eye Contact:Fair  Speech:Clear and Coherent; Normal Rate  Speech Volume:Normal  Handedness:Right   Mood and Affect  Mood:Depressed; Dysphoric; Irritable; Labile; Hopeless; Worthless  Affect:Congruent; Optician, dispensing Processes:Coherent  Descriptions of Associations:Intact  Orientation:Full (Time, Place and Person)  Thought Content:Logical; Rumination  History of Schizophrenia/Schizoaffective disorder:No data recorded Duration of Psychotic Symptoms:No data recorded Hallucinations:Hallucinations: None Description of Auditory Hallucinations: NA Description of Visual Hallucinations: NA  Ideas of Reference:None  Suicidal Thoughts:Suicidal Thoughts: Yes, Passive SI Active Intent and/or Plan: Without Intent; Without Plan; Without Means to Carry Out; Without Access to Means SI Passive Intent and/or Plan: Without Intent; Without Means to Carry Out; Without Access to Means; Without Plan  Homicidal Thoughts:Homicidal Thoughts: No  Sensorium  Memory:Recent Good; Remote Good; Immediate Good  Judgment:Poor  Insight:Lacking  Executive Functions  Concentration:Fair  Attention Span:Fair  Recall:Good  Fund of Knowledge:Fair  Language:Fair  Psychomotor Activity  Psychomotor Activity:Psychomotor Activity: Normal  Assets  Assets:Communication Skills; Desire for Improvement; Resilience  Sleep  Sleep:Sleep: Poor Number of Hours of Sleep: 6.75  Physical Exam: Physical Exam Vitals and nursing note reviewed.  HENT:     Head: Normocephalic.     Nose: Nose normal.     Mouth/Throat:     Pharynx: Oropharynx is clear.  Eyes:     Pupils: Pupils are equal, round, and reactive to light.  Cardiovascular:     Rate and Rhythm: Normal rate.     Pulses: Normal pulses.  Pulmonary:     Effort: Pulmonary effort is normal.  Genitourinary:    Comments: Deferred Musculoskeletal:        General: Normal range of motion.     Cervical back: Normal range of motion.  Skin:    General: Skin is warm and dry.  Neurological:     General: No focal deficit present.     Mental Status: She is alert and oriented to person, place, and time.    Review of Systems   Constitutional: Negative.   HENT: Negative.   Eyes: Negative.   Respiratory: Negative.   Cardiovascular: Negative.   Gastrointestinal: Negative.   Genitourinary: Negative.   Musculoskeletal: Negative.   Skin: Negative.   Neurological: Negative.   Endo/Heme/Allergies:       Allergies: Azithromycin  Psychiatric/Behavioral: Positive for depression ("Improving"), substance abuse (Hx. ETOH/THC use disorder) and suicidal ideas (Able to contract for safety verbally). Negative for hallucinations and memory loss. The patient is nervous/anxious and has insomnia.    Blood pressure (!) 137/101, pulse 72, temperature 98.5 F (36.9 C), temperature source Oral, resp. rate 16, height 5' 4.5" (1.638 m), weight 86.2 kg, last menstrual period 01/06/2021, SpO2 100 %. Body mass index is 32.11 kg/m.  Treatment Plan Summary: Daily contact with patient to assess and evaluate symptoms and progress in treatment and Medication management.  Continue inpatient hospitalization.  Will continue today 01/09/2021 plan as below except where it is noted.  Mood control. Continue Abilify 5 mg po daily.   Depression.  Increased Sertraline  from 50 mg to 75 mg po daily.  Anxiety. Increased Vistaril from 25 mg tid prn to 50 mg qid prn.  Initiated gabapentin 100 mg po tid.  Continue the CIWA protocols on a prn basis for CIWA >10.  Other medical issues: continue,  Protonix 40 mg po Q am for GERD.  Reglan 5 mg po Q 8 hrs prn for nausea.  Initiated B-12 1,000 mcg po daily for B-12 supplementation. Initiated Lisinopril 5 mg po daily HTN Lisinopril 10 mg po once for HTN  Encourage group participation. Discharge disposition plan in progress.   Armandina StammerAgnes Wandra Babin, NP, [id number] 01/09/2021, 5:02 PM

## 2021-01-10 NOTE — Progress Notes (Signed)
Adult Psychoeducational Group Note  Date:  01/10/2021 Time:  5:08 PM  Group Topic/Focus:  Building Self Esteem:   The Focus of this group is helping patients become aware of the effects of self-esteem on their lives, the things they and others do that enhance or undermine their self-esteem, seeing the relationship between their level of self-esteem and the choices they make and learning ways to enhance self-esteem.  Participation Level:  Active  Participation Quality:  Appropriate  Affect:  Appropriate  Cognitive:  Appropriate  Insight: Appropriate  Engagement in Group:  Engaged  Modes of Intervention:  Discussion  Additional Comments:  Pt attended group and participated in discussion.  Marie Myers 01/10/2021, 5:08 PM

## 2021-01-10 NOTE — Plan of Care (Signed)
Nurse discussed coping skills with patient.  

## 2021-01-10 NOTE — Progress Notes (Signed)
[facility] MD Progress Note  01/10/2021 12:51 PM Marie Myers  MRN:  [id number]  Subjective: [place] reports, "I feel okay now, but not earlier this morning. I talked to my son to wish him a good day at school today. After that, I started feeling depressed because he asked me when I will be coming home. Then, my stomach started acting up, I threw up right after breakfast this mroning. 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 I was given some Zofran & Reglan by the nurse. My stomach is trying to settle down now. I'm missing my son. My depression today is #8 & anxiety #9. I'm really trying to be okay. I'm not feeling suicidal now, but I did feel suicidal earlier this morning. Part of my issues with depression is that I feel like I don't have any time for me. My fiance is always there. My son is always there. I have no room to breath most of the time or have any kind of me time. All my family members are in [place], [place]. The only family member I have in [place] is my cousin who lives in [place]. I got no friends. I recently got fired from my job two weeks ago. 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  Objective: Marie Morrisis a 30 year old female with a history of depression and anxiety who presented voluntarily and unaccompanied to [facility] emergency department on 01/06/2021 with chief complaint of increased depressive symptoms and worsening suicidal ideation with a plan to kill herself by overdose on pills in combination with alcohol. Urine drug screen in the ED was positive for THC. The patient reported that she had been drinking wine at night to help herself fall asleep.  Daily notes: Ahlivia is seen, chart reviewed. The chart findings discussed with the treatment team. She presents alert, oriented x 3 & aware of situation. She is visible on the unit, attending group sessions. She reports today that she is still feeling depressed.  She rates her depression #8 & anxiety #9. She complained that she feels that she does not have any time for herself at home because she is always around her fiance & or her son. She continues to have multiple complaints of other medical issues related to her weight loss surgery she had in 2016. She adds that she & her doctors have had some discussion about may be reversing the surgery because it has caused her a lot of problems. She is taking & tolerating her treatment regimen. Denies any side effects. Her Abilify was discontinued yesterday & Olanzapine 5 mg was initiated because it is better absorbed by her G.I. system.  Patient is instructed & encouraged to be patient as her medications continue to get into her system. She is able to verbally contract for safety on the unit while denying any plans or intent to hurt herself here. Her medications has been addressed & dose adjustment done to meet her need. Initiated B-12 supplement yesterday. Patient already has a therapist on an outpatient basis prior to coming to the hospital. She remains with a flat/restricted affect. Denies any AVH, delusional thoughts or paranoia. She does not appear to be responding to any internal stimuli.  Principal Problem: MDD (major depressive disorder), recurrent severe, without psychosis (HCC)  Diagnosis: Principal Problem:   MDD (major depressive disorder), recurrent severe, without psychosis (HCC) Active Problems:   Moderate alcohol use disorder (HCC)  Marijuana use  Total Time spent with patient: 25 minutes  Past Psychiatric History: See H&P  Past Medical History:  Past Medical History:  Diagnosis Date  . Marijuana use 01/08/2021  . Moderate alcohol use disorder (HCC) 01/08/2021    Past Surgical History:  Procedure Laterality Date  . CHOLECYSTECTOMY    . GASTRIC BYPASS    . LAPAROSCOPY N/A 08/11/2019   Procedure: LAPAROSCOPY DIAGNOSTIC;  Surgeon: Harriette Bouillon, MD;  Location: [facility] OR;  Service: General;   Laterality: N/A;   Family History: History reviewed. No pertinent family history.  Family Psychiatric  History: See H&P  Social History:  Social History   Substance and Sexual Activity  Alcohol Use Yes   Comment: occasionally     Social History   Substance and Sexual Activity  Drug Use Never    Social History   Socioeconomic History  . Marital status: Single    Spouse name: Not on file  . Number of children: Not on file  . Years of education: Not on file  . Highest education level: Not on file  Occupational History  . Not on file  Tobacco Use  . Smoking status: Never Smoker  . Smokeless tobacco: Never Used  Substance and Sexual Activity  . Alcohol use: Yes    Comment: occasionally  . Drug use: Never  . Sexual activity: Not on file  Other Topics Concern  . Not on file  Social History Narrative  . Not on file   Social Determinants of Health   Financial Resource Strain: Not on file  Food Insecurity: Not on file  Transportation Needs: Not on file  Physical Activity: Not on file  Stress: Not on file  Social Connections: Not on file   Additional Social History:   Sleep: Fair  Appetite:  Good  Current Medications: Current Facility-Administered Medications  Medication Dose Route Frequency Provider Last Rate Last Admin  . acetaminophen (TYLENOL) tablet 650 mg  650 mg Oral Q6H PRN Antonieta Pert, MD   650 mg at 01/10/21 1158  . alum & mag hydroxide-simeth (MAALOX/MYLANTA) 200-200-20 MG/5ML suspension 30 mL  30 mL Oral Q4H PRN Antonieta Pert, MD      . chlordiazePOXIDE (LIBRIUM) capsule 25 mg  25 mg Oral Q6H PRN Antonieta Pert, MD      . dicyclomine (BENTYL) tablet 20 mg  20 mg Oral BID AC & HS Antonieta Pert, MD   20 mg at 01/10/21 0738  . folic acid (FOLVITE) tablet 1 mg  1 mg Oral Daily Antonieta Pert, MD   1 mg at 01/10/21 0739  . gabapentin (NEURONTIN) capsule 100 mg  100 mg Oral TID Armandina Stammer I, NP   100 mg at 01/10/21 1155  .  hydrOXYzine (ATARAX/VISTARIL) tablet 50 mg  50 mg Oral Q6H PRN Armandina Stammer I, NP   50 mg at 01/10/21 0754  . lisinopril (ZESTRIL) tablet 5 mg  5 mg Oral Daily Armandina Stammer I, NP   5 mg at 01/10/21 0741  . loperamide (IMODIUM) capsule 2-4 mg  2-4 mg Oral PRN Antonieta Pert, MD      . magnesium hydroxide (MILK OF MAGNESIA) suspension 30 mL  30 mL Oral Daily PRN Antonieta Pert, MD      . metoCLOPramide (REGLAN) tablet 5 mg  5 mg Oral Q8H PRN Claudie Revering, MD   5 mg at 01/10/21 0753  . multivitamin with minerals tablet 1 tablet  1 tablet Oral Daily Clary,  Marlane Mingle, MD   1 tablet at 01/10/21 [phone]  . OLANZapine (ZYPREXA) tablet 5 mg  5 mg Oral QHS Claudie Revering, MD   5 mg at 01/09/21 2123  . ondansetron (ZOFRAN-ODT) disintegrating tablet 8 mg  8 mg Oral Q6H PRN Antonieta Pert, MD   8 mg at 01/10/21 0801  . pantoprazole (PROTONIX) EC tablet 80 mg  80 mg Oral Daily Antonieta Pert, MD   80 mg at 01/10/21 0739  . sertraline (ZOLOFT) tablet 75 mg  75 mg Oral Daily Armandina Stammer I, NP   75 mg at 01/10/21 0738  . thiamine tablet 100 mg  100 mg Oral Daily Antonieta Pert, MD   100 mg at 01/10/21 0737  . traZODone (DESYREL) tablet 100 mg  100 mg Oral QHS Armandina Stammer I, NP   100 mg at 01/09/21 2122  . vitamin B-12 (CYANOCOBALAMIN) tablet 1,000 mcg  1,000 mcg Oral Daily Armandina Stammer I, NP   1,000 mcg at 01/10/21 5366    Lab Results:  Results for orders placed or performed during the hospital encounter of 01/07/21 (from the past 48 hour(s))  TSH     Status: None   Collection Time: 01/08/21  6:43 PM  Result Value Ref Range   TSH 1.087 0.350 - 4.500 uIU/mL    Comment: Performed by a 3rd Generation assay with a functional sensitivity of <=0.01 uIU/mL. Performed at [facility], [address] [place], [place] [address]   Hemoglobin A1c     Status: Abnormal   Collection Time: 01/08/21  6:43 PM  Result Value Ref Range   Hgb A1c MFr Bld 4.7 (L) 4.8 - 5.6 %     Comment: (NOTE) Pre diabetes:          5.7%-6.4%  Diabetes:              >6.4%  Glycemic control for   <7.0% adults with diabetes    Mean Plasma Glucose 88.19 mg/dL    Comment: Performed at [facility], [address] [place], [place] [address]  Lipid panel     Status: None   Collection Time: 01/08/21  6:43 PM  Result Value Ref Range   Cholesterol 166 0 - 200 mg/dL   Triglycerides 46 <563 mg/dL   HDL 875 >64 mg/dL   Total CHOL/HDL Ratio 1.4 RATIO   VLDL 9 0 - 40 mg/dL   LDL Cholesterol 38 0 - 99 mg/dL    Comment:        Total Cholesterol/HDL:CHD Risk Coronary Heart Disease Risk Table                     Men   Women  1/2 Average Risk   3.4   3.3  Average Risk       5.0   4.4  2 X Average Risk   9.6   7.1  3 X Average Risk  23.4   11.0        Use the calculated Patient Ratio above and the CHD Risk Table to determine the patient's CHD Risk.        ATP III CLASSIFICATION (LDL):  <100     mg/dL   Optimal  332-951  mg/dL   Near or Above                    Optimal  130-159  mg/dL   Borderline  884-166  mg/dL  High  >190     mg/dL   Very High Performed at [facility] [place], [address] [place], [place] [address]   Vitamin B12     Status: Abnormal   Collection Time: 01/08/21  6:43 PM  Result Value Ref Range   Vitamin B-12 130 (L) 180 - 914 pg/mL    Comment: (NOTE) This assay is not validated for testing neonatal or myeloproliferative syndrome specimens for Vitamin B12 levels. Performed at [facility], [address], [place], [place] [address]    Blood Alcohol level:  Lab Results  Component Value Date   ETH <10 01/06/2021   Metabolic Disorder Labs: Lab Results  Component Value Date   HGBA1C 4.7 (L) 01/08/2021   MPG 88.19 01/08/2021   No results found for: PROLACTIN Lab Results  Component Value Date   CHOL 166 01/08/2021   TRIG 46 01/08/2021   HDL 119 01/08/2021   CHOLHDL 1.4 01/08/2021   VLDL 9 01/08/2021    LDLCALC 38 01/08/2021   Physical Findings: AIMS: Facial and Oral Movements Muscles of Facial Expression: None, normal Lips and Perioral Area: None, normal Jaw: None, normal Tongue: None, normal,Extremity Movements Upper (arms, wrists, hands, fingers): None, normal Lower (legs, knees, ankles, toes): None, normal, Trunk Movements Neck, shoulders, hips: None, normal, Overall Severity Severity of abnormal movements (highest score from questions above): None, normal Incapacitation due to abnormal movements: None, normal Patient's awareness of abnormal movements (rate only patient's report): No Awareness, Dental Status Current problems with teeth and/or dentures?: No Does patient usually wear dentures?: No  CIWA:  CIWA-Ar Total: 1 COWS:     Musculoskeletal: Strength & Muscle Tone: within normal limits Gait & Station: normal Patient leans: N/A  Psychiatric Specialty Exam:  Presentation  General Appearance: Casual; Disheveled  Eye Contact:Fair  Speech:Clear and Coherent; Normal Rate  Speech Volume:Normal  Handedness:Right   Mood and Affect  Mood:Depressed; Dysphoric; Irritable; Labile; Hopeless; Worthless  Affect:Congruent; Media planner Processes:Coherent  Descriptions of Associations:Intact  Orientation:Full (Time, Place and Person)  Thought Content:Logical; Rumination  History of Schizophrenia/Schizoaffective disorder:No data recorded Duration of Psychotic Symptoms:No data recorded Hallucinations:No data recorded  Ideas of Reference:None  Suicidal Thoughts:No data recorded  Homicidal Thoughts:No data recorded  Sensorium  Memory:Recent Good; Remote Good; Immediate Good  Judgment:Poor  Insight:Lacking  Executive Functions  Concentration:Fair  Attention Span:Fair  Recall:Good  Fund of Knowledge:Fair  Language:Fair  Psychomotor Activity  Psychomotor Activity:No data recorded  Assets  Assets:Communication Skills;  Desire for Improvement; Resilience  Sleep  Sleep:No data recorded  Physical Exam: Physical Exam Vitals and nursing note reviewed.  HENT:     Head: Normocephalic.     Nose: Nose normal.     Mouth/Throat:     Pharynx: Oropharynx is clear.  Eyes:     Pupils: Pupils are equal, round, and reactive to light.  Cardiovascular:     Rate and Rhythm: Normal rate.     Pulses: Normal pulses.  Pulmonary:     Effort: Pulmonary effort is normal.  Genitourinary:    Comments: Deferred Musculoskeletal:        General: Normal range of motion.     Cervical back: Normal range of motion.  Skin:    General: Skin is warm and dry.  Neurological:     General: No focal deficit present.     Mental Status: She is alert and oriented to person, place, and time.    Review of Systems  Constitutional:  Negative.   HENT: Negative.   Eyes: Negative.   Respiratory: Negative.   Cardiovascular: Negative.   Gastrointestinal: Negative.   Genitourinary: Negative.   Musculoskeletal: Negative.   Skin: Negative.   Neurological: Negative.   Endo/Heme/Allergies:       Allergies: Azithromycin  Psychiatric/Behavioral: Positive for depression ("Improving"), substance abuse (Hx. ETOH/THC use disorder) and suicidal ideas (Able to contract for safety verbally). Negative for hallucinations and memory loss. The patient is nervous/anxious and has insomnia.    Blood pressure 111/85, pulse 78, temperature 98.7 F (37.1 C), temperature source Oral, resp. rate 16, height 5' 4.5" (1.638 m), weight 86.2 kg, last menstrual period 01/06/2021, SpO2 100 %. Body mass index is 32.11 kg/m.  Treatment Plan Summary: Daily contact with patient to assess and evaluate symptoms and progress in treatment and Medication management.  Continue inpatient hospitalization.  Will continue today 01/10/2021 plan as below except where it is noted.  Mood control. Continue Olanzapine 5 mg po daily DisContinued Abilify 5 mg po daily.    Depression.  Continue Sertraline from 50 mg to 75 mg po daily.  Anxiety. Continue Vistaril 50 mg qid prn.  Continue gabapentin 100 mg po tid.   Insomnia.  Continue Trazodone 100 mg po Q hs.  Continue the CIWA protocols on a prn basis for CIWA >10.  Other medical issues: continue,  Protonix 40 mg po Q am for GERD.  Reglan 5 mg po Q 8 hrs prn for nausea.  Continue B-12 1,000 mcg po daily for B-12 supplementation. Continue Lisinopril 5 mg po daily HTN Lisinopril 10 mg po once for HTN  Encourage group participation. Discharge disposition plan in progress.   Armandina StammerAgnes Babbie Dondlinger, NP, pmhnp-bc 01/10/2021, 12:51 PMPatient ID: Marie Myers, female   DOB: 05/09/1991, 30 y.o.   MRN: [id number]

## 2021-01-10 NOTE — Progress Notes (Signed)
D:  Patient's self inventory sheet, patient has fair sleep, no sleep medication.  Fair appetite, low energy level, good concentration.  Rated depression and anxiety 9, hopeless 8.  Denied withdrawals.  SI, contracts for safety.  Physical problems, headaches.  Denied physical pain.  Goal to be more happy and not so annoyed and frustrated.  Plans to ask for help.  No discharge plans. A:  Medications administered per MD orders.  Emotional support and encouragement given patient. R:  Safety maintained with 15 minute checks.  SI, no plan, contracts for safety.  Denied HI.  Denied A/V hallucinations.

## 2021-01-10 NOTE — Progress Notes (Signed)
Recreation Therapy Notes  Date: 01/10/2021 Time: 900a  Topic: Stress Management   Goal Area(s) Addresses:  Patient will review and complete packet supporting identification of stressors and and techniques to combat compounding stress.   Intervention: Independent Workbook   Education: Stress, Time Management, Coping Skills, Lifestyle Changes, Discharge Planning   Comments: LRT provided pt a workbook reviewing stress management concepts and offering an opportunity to create a plan to address stressors. Pt given the option to complete the packet in the dayroom with RN/MHT staff and peers or at their own pace in their room.   Laurier Jasperson, LRT/CTRS 

## 2021-01-10 NOTE — [facility] Group Notes (Signed)
Pt did not attend morning goals group. 

## 2021-01-10 NOTE — [facility] Group Notes (Signed)
Type of Therapy and Topic: Group Therapy: Overcoming Obstacles  Participation Level: Active  Description of Group:  In this group patients will watch a Ted Talk titled, "Why We Should All Try Therapy". In this video, patients explore the benefits of going to therapy and how this choice may effect their life. This video also explores other emotions that may arise by attending therapy. Patients were also given a worksheet titled "Therapy Goals" that allows patients to explore their goals for therapy and how their life may be different once they have completed therapy.    Therapeutic Goals:  1. Patient will identify personal and current emotions as they relate to therapy.  2. Patient will identify barriers that currently interfere with them beginning therapy.  3. Patient will identify two goals they are willing to set for therapy.     Summary of Patient Progress: Pt introduced herself without needing to be prompted and stated that she was getting the help she needs and has met nice people along the way. Pt was attentive during the TedTalk and participated in the discussion.   Toney Reil, [place] Worker [employer]

## 2021-01-10 NOTE — Progress Notes (Signed)
   01/09/21 2300  Psych Admission Type (Psych Patients Only)  Admission Status Voluntary  Psychosocial Assessment  Patient Complaints Sleep disturbance  Eye Contact Fair  Facial Expression Anxious;Pensive  Affect Anxious;Appropriate to circumstance;Depressed;Irritable;Labile  Speech Logical/coherent;Argumentative  Interaction Assertive;Intrusive  Motor Activity Fidgety  Appearance/Hygiene Unremarkable  Behavior Characteristics Anxious  Mood Anxious  Thought Process  Coherency WDL  Content Blaming others  Delusions None reported or observed  Judgment Poor  Confusion None  Danger to Self  Current suicidal ideation? Passive  Self-Injurious Behavior Some self-injurious ideation observed or expressed.  No lethal plan expressed   Agreement Not to Harm Self Yes  Description of Agreement agrees to notify staff immediately for any thoughts of hurting herself or anyone else  Danger to Others  Danger to Others None reported or observed

## 2021-01-11 NOTE — Progress Notes (Signed)
[facility] Group Notes:  (Nursing/MHT/Case Management/Adjunct)  Date:  01/11/2021  Time:  10:58 PM  Type of Therapy:  Group Therapy  Participation Level:  Active  Participation Quality:  Attentive  Affect:  Appropriate  Cognitive:  Appropriate  Insight:  Appropriate  Engagement in Group:  Engaged  Modes of Intervention:  Discussion  Summary of Progress/Problems:  Lorita Officer 01/11/2021, 10:58 PM

## 2021-01-11 NOTE — [facility] Group Notes (Signed)
.  Psychoeducational Group Note  Date: 01/11/2021 Time: 0900-1000    Goal Setting   Purpose of Group: This group helps to provide patients with the steps of setting a goal that is specific, measurable, attainable, realistic and time specific. A discussion on how we keep ourselves stuck with negative self talk.    Participation Level:  Active  Participation Quality:  Appropriate  Affect:  Appropriate  Cognitive:  Appropriate  Insight:  Improving  Engagement in Group:  Engaged  Additional Comments: Pt attending group rating her energy at a 5/10. States the reason that she is here is due to depression and suicidal ideation. Responding to others and asking good questions  Dione Housekeeper

## 2021-01-11 NOTE — Progress Notes (Signed)
   01/11/21 0800  Psych Admission Type (Psych Patients Only)  Admission Status Voluntary  Psychosocial Assessment  Patient Complaints Anxiety;Sadness  Eye Contact Fair  Facial Expression Anxious;Pensive  Affect Anxious;Appropriate to circumstance;Depressed;Irritable  Speech Logical/coherent  Interaction Assertive  Motor Activity Other (Comment) (wnl)  Appearance/Hygiene Unremarkable  Behavior Characteristics Cooperative;Anxious  Mood Depressed;Anxious  Thought Process  Coherency WDL  Content Blaming others  Delusions None reported or observed  Perception WDL  Hallucination None reported or observed  Judgment Poor  Confusion None  Danger to Self  Current suicidal ideation? Denies  Danger to Others  Danger to Others None reported or observed    Pt denied SI/HI/AVH.  Pt observed attending groups and interacting with peers.  Pt is assertive and makes her needs known.  RN provided support and assessed for needs and concerns.  Pt remains safe on the unit with q 15 min room checks in place.

## 2021-01-11 NOTE — Progress Notes (Signed)
[facility]  01/11/2021 2:26 PM Marie LevinsOceana Myers  MRN:  [id number]  Subjective: Marie NortonOceana reports, "I'm doing well, I did not wake up today feeling suicidal. I did well last night, slept better, kind of. I'm tolerating my meals & medicines so far today. But, I did take some Reglan before I ate breakfast. I have talked to my son this morning already. He told me about his day. He is doing well. The only complain I have is, I'm having some pain to lower leg areas".    Objective: Marie NortonOceana Myers a 30 year old female with a history of depression and anxiety who presented voluntarily and unaccompanied to [facility] department on 01/06/2021 with chief complaint of increased depressive symptoms and worsening suicidal ideation with a plan to kill herself by overdose on pills in combination with alcohol. Urine drug screen in the ED was positive for THC. The patient reported that she had been drinking wine at night to help herself fall asleep.  Daily notes: Marie NortonOceana is seen, chart reviewed. The chart findings discussed with the treatment team. She presents alert, oriented x 3 & aware of situation. She is visible on the unit, attending group sessions. She reports today that she is doing well as she woke up this morning without any suicidal ideations. She says she slept well last night. Has no issues with stomach pain, cramps, nausea or vomiting this morning. She says, so far, she is tolerating her meals & the medications without throwing up this morning. She adds that she took some Reglan prior to breakfast this this morning. She is taking & tolerating her other treatment regimen. Denies any side effects. Her Abilify was discontinued 2 days ago & Olanzapine 5 mg was initiated because it is better absorbed by her G.I. system. She denies any SIHI. Her medications has been addressed & dose adjustment done to meet her need. Initiated B-12 supplement two days ago. Patient already has a  therapist on an outpatient basis prior to coming to the hospital. She presents with an improved affect & ey contact today. Denies any AVH, delusional thoughts or paranoia. She does not appear to be responding to any internal stimuli. She is complaining of pain to her lower leg areas. Instructed to ask for tylenol as aordered prn.  Principal Problem: MDD (major depressive disorder), recurrent severe, without psychosis (HCC)  Diagnosis: Principal Problem:   MDD (major depressive disorder), recurrent severe, without psychosis (HCC) Active Problems:   Moderate alcohol use disorder (HCC)   Marijuana use  Total Time spent with patient: 25 minutes  Past Psychiatric History: See H&P  Past Medical History:  Past Medical History:  Diagnosis Date  . Marijuana use 01/08/2021  . Moderate alcohol use disorder (HCC) 01/08/2021    Past Surgical History:  Procedure Laterality Date  . CHOLECYSTECTOMY    . GASTRIC BYPASS    . LAPAROSCOPY N/A 08/11/2019   Procedure: LAPAROSCOPY DIAGNOSTIC;  Surgeon: Harriette Bouillonornett, Thomas, MD;  Location: [facility] OR;  Service: General;  Laterality: N/A;   Family History: History reviewed. No pertinent family history.  Family Psychiatric  History: See H&P  Social History:  Social History   Substance and Sexual Activity  Alcohol Use Yes   Comment: occasionally     Social History   Substance and Sexual Activity  Drug Use Never    Social History   Socioeconomic History  . Marital status: Single    Spouse name: Not on file  . Number of children: Not on file  .  Years of education: Not on file  . Highest education level: Not on file  Occupational History  . Not on file  Tobacco Use  . Smoking status: Never Smoker  . Smokeless tobacco: Never Used  Substance and Sexual Activity  . Alcohol use: Yes    Comment: occasionally  . Drug use: Never  . Sexual activity: Not on file  Other Topics Concern  . Not on file  Social History Narrative  . Not on file   Social  Determinants of Health   Financial Resource Strain: Not on file  Food Insecurity: Not on file  Transportation Needs: Not on file  Physical Activity: Not on file  Stress: Not on file  Social Connections: Not on file   Additional Social History:   Sleep: Fair  Appetite:  Good  Current Medications: Current Facility-Administered Medications  Medication Dose Route Frequency Provider Last Rate Last Admin  . acetaminophen (TYLENOL) tablet 650 mg  650 mg Oral Q6H PRN Antonieta Pert, MD   650 mg at 01/11/21 1319  . alum & mag hydroxide-simeth (MAALOX/MYLANTA) 200-200-20 MG/5ML suspension 30 mL  30 mL Oral Q4H PRN Antonieta Pert, MD      . dicyclomine (BENTYL) tablet 20 mg  20 mg Oral BID AC & HS Antonieta Pert, MD   20 mg at 01/11/21 0800  . folic acid (FOLVITE) tablet 1 mg  1 mg Oral Daily Antonieta Pert, MD   1 mg at 01/11/21 0759  . gabapentin (NEURONTIN) capsule 100 mg  100 mg Oral TID Armandina Stammer I, NP   100 mg at 01/11/21 1321  . hydrOXYzine (ATARAX/VISTARIL) tablet 50 mg  50 mg Oral Q6H PRN Armandina Stammer I, NP   50 mg at 01/11/21 0800  . lisinopril (ZESTRIL) tablet 5 mg  5 mg Oral Daily Natasja Niday I, NP   5 mg at 01/11/21 0800  . magnesium hydroxide (MILK OF MAGNESIA) suspension 30 mL  30 mL Oral Daily PRN Antonieta Pert, MD      . metoCLOPramide [facility]) tablet 5 mg  5 mg Oral Q8H PRN Claudie Revering, MD   5 mg at 01/11/21 0654  . multivitamin with minerals tablet 1 tablet  1 tablet Oral Daily Antonieta Pert, MD   1 tablet at 01/11/21 0758  . OLANZapine (ZYPREXA) tablet 5 mg  5 mg Oral QHS Claudie Revering, MD   5 mg at 01/10/21 2114  . pantoprazole (PROTONIX) EC tablet 80 mg  80 mg Oral Daily Antonieta Pert, MD   80 mg at 01/11/21 0654  . sertraline (ZOLOFT) tablet 75 mg  75 mg Oral Daily Armandina Stammer I, NP   75 mg at 01/11/21 0758  . thiamine tablet 100 mg  100 mg Oral Daily Antonieta Pert, MD   100 mg at 01/11/21 0758  . traZODone (DESYREL) tablet  100 mg  100 mg Oral QHS Armandina Stammer I, NP   100 mg at 01/10/21 2114  . vitamin B-12 (CYANOCOBALAMIN) tablet 1,000 mcg  1,000 mcg Oral Daily Armandina Stammer I, NP   1,000 mcg at 01/11/21 0800    Lab Results:  No results found for this or any previous visit (from the past 48 hour(s)). Blood Alcohol level:  Lab Results  Component Value Date   ETH <10 01/06/2021   Metabolic Disorder Labs: Lab Results  Component Value Date   HGBA1C 4.7 (L) 01/08/2021   MPG 88.19 01/08/2021   No results found for:  PROLACTIN Lab Results  Component Value Date   CHOL 166 01/08/2021   TRIG 46 01/08/2021   HDL 119 01/08/2021   CHOLHDL 1.4 01/08/2021   VLDL 9 01/08/2021   LDLCALC 38 01/08/2021   Physical Findings: AIMS: Facial and Oral Movements Muscles of Facial Expression: None, normal Lips and Perioral Area: None, normal Jaw: None, normal Tongue: None, normal,Extremity Movements Upper (arms, wrists, hands, fingers): None, normal Lower (legs, knees, ankles, toes): None, normal, Trunk Movements Neck, shoulders, hips: None, normal, Overall Severity Severity of abnormal movements (highest score from questions above): None, normal Incapacitation due to abnormal movements: None, normal Patient's awareness of abnormal movements (rate only patient's report): No Awareness, Dental Status Current problems with teeth and/or dentures?: No Does patient usually wear dentures?: No  CIWA:  CIWA-Ar Total: 1 COWS:     Musculoskeletal: Strength & Muscle Tone: within normal limits Gait & Station: normal Patient leans: N/A  Psychiatric Specialty Exam:  Presentation  General Appearance: Casual; Disheveled  Eye Contact:Fair  Speech:Clear and Coherent; Normal Rate  Speech Volume:Normal  Handedness:Right   Mood and Affect  Mood:Depressed; Dysphoric; Irritable; Labile; Hopeless; Worthless  Affect:Congruent; Media planner Processes:Coherent  Descriptions of  Associations:Intact  Orientation:Full (Time, Place and Person)  Thought Content:Logical; Rumination  History of Schizophrenia/Schizoaffective disorder:No data recorded Duration of Psychotic Symptoms:No data recorded Hallucinations:No data recorded  Ideas of Reference:None  Suicidal Thoughts:No data recorded  Homicidal Thoughts:No data recorded  Sensorium  Memory:Recent Good; Remote Good; Immediate Good  Judgment:Poor  Insight:Lacking  Executive Functions  Concentration:Fair  Attention Span:Fair  Recall:Good  Fund of Knowledge:Fair  Language:Fair  Psychomotor Activity  Psychomotor Activity:No data recorded  Assets  Assets:Communication Skills; Desire for Improvement; Resilience  Sleep  Sleep:No data recorded  Physical Exam: Physical Exam Vitals and nursing note reviewed.  HENT:     Head: Normocephalic.     Nose: Nose normal.     Mouth/Throat:     Pharynx: Oropharynx is clear.  Eyes:     Pupils: Pupils are equal, round, and reactive to light.  Cardiovascular:     Rate and Rhythm: Normal rate.     Pulses: Normal pulses.  Pulmonary:     Effort: Pulmonary effort is normal.  Genitourinary:    Comments: Deferred Musculoskeletal:        General: Normal range of motion.     Cervical back: Normal range of motion.  Skin:    General: Skin is warm and dry.  Neurological:     General: No focal deficit present.     Mental Status: She is alert and oriented to person, place, and time.    Review of Systems  Constitutional: Negative.   HENT: Negative.   Eyes: Negative.   Respiratory: Negative.   Cardiovascular: Negative.   Gastrointestinal: Negative.   Genitourinary: Negative.   Musculoskeletal: Negative.   Skin: Negative.   Neurological: Negative.   Endo/Heme/Allergies:       Allergies: Azithromycin  Psychiatric/Behavioral: Positive for depression ("Improving"), substance abuse (Hx. ETOH/THC use disorder) and suicidal ideas (Able to contract for safety  verbally). Negative for hallucinations and memory loss. The patient is nervous/anxious and has insomnia.    Blood pressure 111/85, pulse 78, temperature 98.7 F (37.1 C), temperature source Oral, resp. rate 16, height 5' 4.5" (1.638 m), weight 86.2 kg, last menstrual period 01/06/2021, SpO2 100 %. Body mass index is 32.11 kg/m.  Treatment Plan Summary: Daily contact with patient to assess and evaluate symptoms and progress in  treatment and Medication management.  Continue inpatient hospitalization.  Will continue today 01/11/2021 plan as below except where it is noted.  Mood control. Continue Olanzapine 5 mg po daily DisContinued Abilify 5 mg po daily.   Depression.  Continue Sertraline 75 mg po daily.  Anxiety. Continue Vistaril 50 mg qid prn.  Continue gabapentin 100 mg po tid.   Insomnia.  Continue Trazodone 100 mg po Q hs.  Continue the CIWA protocols on a prn basis for CIWA >10.  Other medical issues: continue,  Protonix 40 mg po Q am for GERD.  Reglan 5 mg po Q 8 hrs prn for nausea.  Continue B-12 1,000 mcg po daily for B-12 supplementation. Continue Lisinopril 5 mg po daily HTN Lisinopril 10 mg po once for HTN  Encourage group participation. Discharge disposition plan in progress.   Armandina Stammer, NP, pmhnp-bc 01/11/2021, 2:26 PMPatient ID: Marie Myers, female   DOB: 1991-06-03, 30 y.o.   MRN: [id number] Patient ID: Marie Myers, female   DOB: 1991/03/12, 30 y.o.   MRN: [id number]

## 2021-01-11 NOTE — Progress Notes (Addendum)
   01/10/21 2100  Psych Admission Type (Psych Patients Only)  Admission Status Voluntary  Psychosocial Assessment  Patient Complaints Anxiety;Depression;Sadness  Eye Contact Fair  Facial Expression Anxious;Pensive  Affect Anxious;Appropriate to circumstance;Depressed;Irritable  Speech Logical/coherent  Interaction Assertive  Motor Activity Other (Comment) (wnl)  Appearance/Hygiene Unremarkable  Behavior Characteristics Cooperative;Anxious  Mood Depressed;Anxious  Thought Process  Coherency WDL  Content Blaming others  Delusions None reported or observed  Perception WDL  Hallucination None reported or observed  Judgment Poor  Confusion None  Danger to Self  Current suicidal ideation? Denies  Danger to Others  Danger to Others None reported or observed   Pt denies SI, HI, AVH and pain. Pt rates anxiety 8/10 and depression 7/10. Pt sad and a little irritable on approach. Pt upset that her fiance told her mother that she was hospitalized at [facility] for her mental health. Pt hurt by the fact that her mother has not tried to contact her at all since hearing of the news. Patient admits that relationship with her mother has not been the best and that her mother is narcissistic. The most recent issue was over her deceased sister's ashes, which her mother could have procured for her from the niece but chose to withhold her help per the pt.  Pt advised to decide how to handle her relationship with her mother and if it was positive or negative for her mental health right now. Pt advised to set realistic goals for any interaction with her mother. Pt verbalized understanding. Pt also still grieving loss of family members back home in [place], [place], while she is here.  Pt states that this hospitalization has been good for her and she is still working on coping skills to deal with her anger and frustrations. Wants to also work on communicating more effectively.

## 2021-01-12 MED ORDER — SERTRALINE HCL 100 MG PO TABS
100.0000 mg | ORAL_TABLET | Freq: Every day | ORAL | Status: DC
  Administered 2021-01-13: 100 mg via ORAL
  Filled 2021-01-12 (×3): qty 1

## 2021-01-12 MED ORDER — GABAPENTIN 100 MG PO CAPS
200.0000 mg | ORAL_CAPSULE | Freq: Three times a day (TID) | ORAL | Status: DC
  Administered 2021-01-12 – 2021-01-13 (×3): 200 mg via ORAL
  Filled 2021-01-12 (×8): qty 2

## 2021-01-12 NOTE — [facility] Group Notes (Signed)
Psychoeducational Group Note  Date: 01-12-21 Time:  1300  Group Topic/Focus:  Making Healthy Choices:   The focus of this group is to help patients identify negative/unhealthy choices they were using prior to admission and identify positive/healthier coping strategies to replace them upon discharge.In this group, patients started asking about the brain and how the brain works with and how the chemicals work for those who use substances, the pros and cons of saboxone.  Participation Level:  Active  Participation Quality:  Appropriate  Affect:  Appropriate  Cognitive:  Oriented  Insight:  Improving  Engagement in Group:  Engaged  Additional Comments: Pt rates her energy at an 8/10. Participated fully in the group.  Dione Housekeeper

## 2021-01-12 NOTE — [facility] Suicide Risk Assessment (Signed)
[facility] INPATIENT:  Family/Significant Other Suicide Prevention Education  Suicide Prevention Education:  Education Completed;Vickii Chafe (fiance) [phone],  (name of family member/significant other) has been identified by the patient as the family member/significant other with whom the patient will be residing, and identified as the person(s) who will aid the patient in the event of a mental health crisis (suicidal ideations/suicide attempt).  With written consent from the patient, the family member/significant other has been provided the following suicide prevention education, prior to the and/or following the discharge of the patient.  The suicide prevention education provided includes the following:  Suicide risk factors  Suicide prevention and interventions  National Suicide Hotline telephone number  [facility] assessment telephone number  [facility] Emergency Assistance 911  [facility] and/or Residential Mobile Crisis Unit telephone number  Request made of family/significant other to:  Remove weapons (e.g., guns, rifles, knives), all items previously/currently identified as safety concern.    Remove drugs/medications (over-the-counter, prescriptions, illicit drugs), all items previously/currently identified as a safety concern.  The family member/significant other verbalizes understanding of the suicide prevention education information provided.  The family member/significant other agrees to remove the items of safety concern listed above.  Fiance states that patient lives with him and he will be picking her up at discharge.  There are guns in the home, but they are kept locked in the trunk of his car, to which she has no access.  Given that she was thinking of overdosing, CSW advised loved one to lock up the medicines in the house and he was amenable to doing so.  He stated she has been through many things recently, listed some of them, and was able to agree  that she had one crisis on top of another that led to the current crisis.  He feels she is doing better and is ready to have her at home.  Carloyn Jaeger Grossman-Orr 01/12/2021, 3:13 PM

## 2021-01-12 NOTE — Progress Notes (Signed)
Adult Psychoeducational Group Note  Date:  01/12/2021 Time:  9:45 PM  Group Topic/Focus:  Wrap-Up Group:   The focus of this group is to help patients review their daily goal of treatment and discuss progress on daily workbooks.  Participation Level:  Active  Participation Quality:  Attentive  Affect:  Appropriate  Cognitive:  Appropriate  Insight: Appropriate  Engagement in Group:  Supportive  Modes of Intervention:  Support  Additional Comments:  Pt articulated that her goal for today was to find coping ways to control her anxiety and this goal was accomplished. Pt stated she did talk with a medical doctor about her care. Pt conveyed she made a phone call to her finance and son and reported relationship with her family and support system was the same. Pt reported she took all medications provided and attended all meal services with 100% intake. Pt said her appetite was good today. Pt evaluated her sleep last night as good. Pt verbalized she felt good about herself and rated her overall day a 7 out of 10 on this date. Pt articulated she had no physical pain. Pt denies no auditory or visual hallucinations or thoughts of harming herself or others. Pt said she would alert staff if anything changed. End of Wrap-Up Group progress report.      Nicoletta Dress 01/12/2021, 9:45 PM

## 2021-01-12 NOTE — Progress Notes (Signed)
   01/11/21 2100  COVID-19 Daily Checkoff  Have you had a fever (temp > 37.80C/100F)  in the past 24 hours?  No  If you have had runny nose, nasal congestion, sneezing in the past 24 hours, has it worsened? No  COVID-19 EXPOSURE  Have you traveled outside the state in the past 14 days? No  Have you been in contact with someone with a confirmed diagnosis of COVID-19 or PUI in the past 14 days without wearing appropriate PPE? No  Have you been living in the same home as a person with confirmed diagnosis of COVID-19 or a PUI (household contact)? No  Have you been diagnosed with COVID-19? No

## 2021-01-12 NOTE — Progress Notes (Addendum)
[facility] MD Progress Note  01/12/2021 10:56 AM Marie Myers  MRN:  [id number]  Subjective:  Marie Myers was observed sitting in dayroom interacting with peers.  She presents flat, guarded but pleasant.  Denying suicidal or homicidal ideations.  Denies auditory or visual hallucinations.  Appears to be future and goal oriented as she reported " I have a 30 year old son to provide for and I need to get back on track."    States she is currently followed by a therapist at [facility] Evelena Peat) and is interested in following up with psychiatry at a different facility.  She reports she was previously employed by [facility] [employer] and didn't feel comfortable going there for medication management.  She reported a previous inpatient admission for suicide attempts.  Denies having thoughts plan or intent currently.  She reports a good appetite.  States she is resting well throughout the night.   Marie Myers reports concerns with ankle pain and states she does not feel as if the gabapentin is helping with her pain.  Discussed titration to medication.  Patient was receptive to plan.  Will increase Zoloft 75 mg to 100 mg and increase gabapentin 100 mg to 200 mg p.o. twice daily.  Support, encouragement and  reassurance was provided.   Principal Problem: MDD (major depressive disorder), recurrent severe, without psychosis (HCC) Diagnosis: Principal Problem:   MDD (major depressive disorder), recurrent severe, without psychosis (HCC) Active Problems:   Moderate alcohol use disorder (HCC)   Marijuana use  Total Time spent with patient: 15 minutes  Past Psychiatric History:   Past Medical History:  Past Medical History:  Diagnosis Date  . Marijuana use 01/08/2021  . Moderate alcohol use disorder (HCC) 01/08/2021    Past Surgical History:  Procedure Laterality Date  . CHOLECYSTECTOMY    . GASTRIC BYPASS    . LAPAROSCOPY N/A 08/11/2019   Procedure: LAPAROSCOPY DIAGNOSTIC;  Surgeon: Harriette Bouillon,  MD;  Location: [facility] OR;  Service: General;  Laterality: N/A;   Family History: History reviewed. No pertinent family history. Family Psychiatric  History: Social History:  Social History   Substance and Sexual Activity  Alcohol Use Yes   Comment: occasionally     Social History   Substance and Sexual Activity  Drug Use Never    Social History   Socioeconomic History  . Marital status: Single    Spouse name: Not on file  . Number of children: Not on file  . Years of education: Not on file  . Highest education level: Not on file  Occupational History  . Not on file  Tobacco Use  . Smoking status: Never Smoker  . Smokeless tobacco: Never Used  Substance and Sexual Activity  . Alcohol use: Yes    Comment: occasionally  . Drug use: Never  . Sexual activity: Not on file  Other Topics Concern  . Not on file  Social History Narrative  . Not on file   Social Determinants of Health   Financial Resource Strain: Not on file  Food Insecurity: Not on file  Transportation Needs: Not on file  Physical Activity: Not on file  Stress: Not on file  Social Connections: Not on file   Additional Social History:                         Sleep: Good  Appetite:  Fair  Current Medications: Current Facility-Administered Medications  Medication Dose Route Frequency Provider Last Rate Last Admin  .  acetaminophen (TYLENOL) tablet 650 mg  650 mg Oral Q6H PRN Antonieta Pert, MD   650 mg at 01/11/21 1319  . alum & mag hydroxide-simeth (MAALOX/MYLANTA) 200-200-20 MG/5ML suspension 30 mL  30 mL Oral Q4H PRN Antonieta Pert, MD      . dicyclomine (BENTYL) tablet 20 mg  20 mg Oral BID AC & HS Antonieta Pert, MD   20 mg at 01/12/21 0858  . folic acid (FOLVITE) tablet 1 mg  1 mg Oral Daily Antonieta Pert, MD   1 mg at 01/12/21 0900  . gabapentin (NEURONTIN) capsule 100 mg  100 mg Oral TID Armandina Stammer I, NP   100 mg at 01/12/21 0858  . hydrOXYzine (ATARAX/VISTARIL) tablet  50 mg  50 mg Oral Q6H PRN Armandina Stammer I, NP   50 mg at 01/12/21 0902  . lisinopril (ZESTRIL) tablet 5 mg  5 mg Oral Daily Armandina Stammer I, NP   5 mg at 01/12/21 0859  . magnesium hydroxide (MILK OF MAGNESIA) suspension 30 mL  30 mL Oral Daily PRN Antonieta Pert, MD      . metoCLOPramide [facility]) tablet 5 mg  5 mg Oral Q8H PRN Claudie Revering, MD   5 mg at 01/11/21 0654  . multivitamin with minerals tablet 1 tablet  1 tablet Oral Daily Antonieta Pert, MD   1 tablet at 01/12/21 [phone]  . OLANZapine (ZYPREXA) tablet 5 mg  5 mg Oral QHS Claudie Revering, MD   5 mg at 01/11/21 2050  . pantoprazole (PROTONIX) EC tablet 80 mg  80 mg Oral Daily Antonieta Pert, MD   80 mg at 01/12/21 0857  . sertraline (ZOLOFT) tablet 75 mg  75 mg Oral Daily Armandina Stammer I, NP   75 mg at 01/12/21 0857  . thiamine tablet 100 mg  100 mg Oral Daily Antonieta Pert, MD   100 mg at 01/12/21 0859  . traZODone (DESYREL) tablet 100 mg  100 mg Oral QHS Armandina Stammer I, NP   100 mg at 01/11/21 2049  . vitamin B-12 (CYANOCOBALAMIN) tablet 1,000 mcg  1,000 mcg Oral Daily Armandina Stammer I, NP   1,000 mcg at 01/12/21 6213    Lab Results: No results found for this or any previous visit (from the past 48 hour(s)).  Blood Alcohol level:  Lab Results  Component Value Date   ETH <10 01/06/2021    Metabolic Disorder Labs: Lab Results  Component Value Date   HGBA1C 4.7 (L) 01/08/2021   MPG 88.19 01/08/2021   No results found for: PROLACTIN Lab Results  Component Value Date   CHOL 166 01/08/2021   TRIG 46 01/08/2021   HDL 119 01/08/2021   CHOLHDL 1.4 01/08/2021   VLDL 9 01/08/2021   LDLCALC 38 01/08/2021    Physical Findings: AIMS: Facial and Oral Movements Muscles of Facial Expression: None, normal Lips and Perioral Area: None, normal Jaw: None, normal Tongue: None, normal,Extremity Movements Upper (arms, wrists, hands, fingers): None, normal Lower (legs, knees, ankles, toes): None, normal, Trunk  Movements Neck, shoulders, hips: None, normal, Overall Severity Severity of abnormal movements (highest score from questions above): None, normal Incapacitation due to abnormal movements: None, normal Patient's awareness of abnormal movements (rate only patient's report): No Awareness, Dental Status Current problems with teeth and/or dentures?: No Does patient usually wear dentures?: No  CIWA:  CIWA-Ar Total: 1 COWS:     Musculoskeletal: Strength & Muscle Tone: within normal limits Gait & Station:  normal Patient leans: N/A  Psychiatric Specialty Exam:  Presentation  General Appearance: Casual; Disheveled  Eye Contact:Fair  Speech:Clear and Coherent; Normal Rate  Speech Volume:Normal  Handedness:Right   Mood and Affect  Mood:Depressed; Dysphoric; Irritable; Labile; Hopeless; Worthless  Affect:Congruent; Media planner Processes:Coherent  Descriptions of Associations:Intact  Orientation:Full (Time, Place and Person)  Thought Content:Logical; Rumination  History of Schizophrenia/Schizoaffective disorder:No data recorded Duration of Psychotic Symptoms:No data recorded Hallucinations:No data recorded Ideas of Reference:None  Suicidal Thoughts:No data recorded Homicidal Thoughts:No data recorded  Sensorium  Memory:Recent Good; Remote Good; Immediate Good  Judgment:Poor  Insight:Lacking   Executive Functions  Concentration:Fair  Attention Span:Fair  Recall:Good  Fund of Knowledge:Fair  Language:Fair   Psychomotor Activity  Psychomotor Activity:No data recorded  Assets  Assets:Communication Skills; Desire for Improvement; Resilience   Sleep  Sleep:No data recorded   Physical Exam: Physical Exam Vitals and nursing note reviewed.  Cardiovascular:     Rate and Rhythm: Normal rate and regular rhythm.  Neurological:     Mental Status: She is alert.  Psychiatric:        Attention and Perception: Attention and  perception normal.        Mood and Affect: Mood normal.        Speech: Speech normal.        Behavior: Behavior normal.        Thought Content: Thought content normal.        Cognition and Memory: Cognition normal.        Judgment: Judgment normal.    Review of Systems  Psychiatric/Behavioral: Positive for depression. The patient is nervous/anxious.   All other systems reviewed and are negative.  Blood pressure 104/61, pulse 66, temperature 98 F (36.7 C), temperature source Oral, resp. rate 16, height 5' 4.5" (1.638 m), weight 86.2 kg, last menstrual period 01/06/2021, SpO2 99 %. Body mass index is 32.11 kg/m.   Treatment Plan Summary: Daily contact with patient to assess and evaluate symptoms and progress in treatment and Medication management   Continue with current treatment plan on 01/12/2021 as listed below excepted wherenote  Major Depression Disorder without Psychosis  Increased Gabapentin 100 mg to 200 po TID Increased  Zoloft 75 mg po to 100 mg  Daily Continue Zyprexa 5mg  nightly  Continue Trazodone 100 mg po nightly   , NP 01/12/2021, 10:56 AM

## 2021-01-12 NOTE — Progress Notes (Signed)
The focus of this group is to help patients establish daily goals to achieve during treatment and discuss how the patient can incorporate goal setting into their daily lives to aide in recovery. 

## 2021-01-12 NOTE — B[facility] Group Notes (Signed)
Adult Psychoeducational Group Not Date:  01/12/2021 Time:  1610-9604 Group Topic/Focus: PROGRESSIVE RELAXATION. A group where deep breathing is taught and tensing and relaxation muscle groups is used. Imagery is used as well.  Pts are asked to imagine 3 pillars that hold them up when they are not able to hold themselves up.  Participation Level:  Active  Participation Quality:  Appropriate  Affect:  Appropriate  Cognitive:  Oriented  Insight: Improving  Engagement in Group:  Engaged  Modes of Intervention:  Activity, Discussion, Education, and Support  Additional Comments:  Rates her energy as an 8/10. States what holds her up is:  Her son, her fiance and God.  Dione Housekeeper

## 2021-01-12 NOTE — Progress Notes (Signed)
D. Pt presents with an anxious affect/mood, but is friendly during interactions- observed in the milieu interacting well with peers and staff, and attending groups. Per pt's self inventory, pt rated her depression, hopelessness and anxiety a 6/4/7, respectively. Pt states that her goal today is "controlling my anxiety and be more positive" Pt currently denies SI/HI and AVH A. Labs and vitals monitored. Pt administered scheduled meds and prn Vistaril for anxiety. Pt supported emotionally and encouraged to express concerns and ask questions.   R. Pt remains safe with 15 minute checks. Will continue POC.

## 2021-01-13 MED ORDER — OLANZAPINE 5 MG PO TABS: 5.0000 mg | ORAL_TABLET | Freq: Every day | ORAL | 0 refills | Status: DC

## 2021-01-13 MED ORDER — ADULT MULTIVITAMIN W/MINERALS CH: 1.0000 | ORAL_TABLET | Freq: Every day | ORAL | Status: DC

## 2021-01-13 MED ORDER — DICYCLOMINE HCL 20 MG PO TABS: 20.0000 mg | ORAL_TABLET | Freq: Two times a day (BID) | ORAL | 0 refills | Status: DC

## 2021-01-13 MED ORDER — LISINOPRIL 5 MG PO TABS: 5.0000 mg | ORAL_TABLET | Freq: Every day | ORAL | 0 refills | Status: DC

## 2021-01-13 MED ORDER — TRAZODONE HCL 100 MG PO TABS: 100.0000 mg | ORAL_TABLET | Freq: Every day | ORAL | 0 refills | Status: DC

## 2021-01-13 MED ORDER — HYDROXYZINE HCL 50 MG PO TABS: 50.0000 mg | ORAL_TABLET | Freq: Four times a day (QID) | ORAL | 0 refills | Status: DC | PRN

## 2021-01-13 MED ORDER — MAGNESIUM CITRATE PO SOLN
1.0000 | Freq: Once | ORAL | Status: DC
  Filled 2021-01-13: qty 296

## 2021-01-13 MED ORDER — CYANOCOBALAMIN 1000 MCG PO TABS: 1000.0000 ug | ORAL_TABLET | Freq: Every day | ORAL | 0 refills | Status: DC

## 2021-01-13 MED ORDER — SERTRALINE HCL 100 MG PO TABS: 100.0000 mg | ORAL_TABLET | Freq: Every day | ORAL | 0 refills | Status: DC

## 2021-01-13 MED ORDER — GABAPENTIN 100 MG PO CAPS: 200.0000 mg | ORAL_CAPSULE | Freq: Three times a day (TID) | ORAL | 0 refills | Status: DC

## 2021-01-13 MED ORDER — THIAMINE HCL 100 MG PO TABS: 100.0000 mg | ORAL_TABLET | Freq: Every day | ORAL | Status: DC

## 2021-01-13 MED ORDER — METOCLOPRAMIDE HCL 5 MG PO TABS: 5.0000 mg | ORAL_TABLET | Freq: Three times a day (TID) | ORAL | 0 refills | Status: DC | PRN

## 2021-01-13 MED ORDER — FOLIC ACID 1 MG PO TABS: 1.0000 mg | ORAL_TABLET | Freq: Every day | ORAL | 0 refills | Status: DC

## 2021-01-13 NOTE — Plan of Care (Signed)
Nurse discussed coping skills with patient.  

## 2021-01-13 NOTE — [facility] Suicide Risk Assessment (Signed)
[facility] Discharge Suicide Risk Assessment   Principal Problem: MDD (major depressive disorder), recurrent severe, without psychosis (HCC) Discharge Diagnoses: Principal Problem:   MDD (major depressive disorder), recurrent severe, without psychosis (HCC) Active Problems:   Moderate alcohol use disorder (HCC)   Marijuana use   Total Time spent with patient: 20 minutes  Musculoskeletal: Strength & Muscle Tone: within normal limits Gait & Station: normal Patient leans: N/A  Psychiatric Specialty Exam  Presentation  General Appearance: Appropriate for Environment; Casual  Eye Contact:Good  Speech:Clear and Coherent; Normal Rate  Speech Volume:Normal  Handedness:Right   Mood and Affect  Mood:Euthymic  Duration of Depression Symptoms: No data recorded Affect:Congruent   Thought Process  Thought Processes:Coherent; Goal Directed; Linear  Descriptions of Associations:Intact  Orientation:Full (Time, Place and Person)  Thought Content:Logical  History of Schizophrenia/Schizoaffective disorder:No data recorded Duration of Psychotic Symptoms:No data recorded Hallucinations:Hallucinations: None  Ideas of Reference:None  Suicidal Thoughts:Suicidal Thoughts: No  Homicidal Thoughts:Homicidal Thoughts: No   Sensorium  Memory:Immediate Good; Recent Good; Remote Good  Judgment:Fair  Insight:Fair   Executive Functions  Concentration:Good  Attention Span:Good  Recall:Good  Fund of Knowledge:Good  Language:Good   Psychomotor Activity  Psychomotor Activity:Psychomotor Activity: Normal   Assets  Assets:Communication Skills; Desire for Improvement; Housing; Resilience; Social Support   Sleep  Sleep:Sleep: Good Number of Hours of Sleep: 6.25   Physical Exam: Physical Exam Vitals and nursing note reviewed.  HENT:     Head: Normocephalic and atraumatic.  Pulmonary:     Effort: Pulmonary effort is normal.  Neurological:     General: No focal deficit  present.     Mental Status: She is alert and oriented to person, place, and time.    Review of Systems  Constitutional: Negative.   Respiratory: Negative.   Cardiovascular: Negative.   Neurological: Negative.   Psychiatric/Behavioral: Negative for hallucinations and suicidal ideas. The patient does not have insomnia.    Vital signs on 01/13/2021: BP 142/83 sitting and 135/99 standing Pulse 76 1 sitting and 76 standing Temperature 98.4 O2 sat 100% Blood pressure (!) 135/99, pulse 76, temperature 98.4 F (36.9 C), temperature source Oral, resp. rate 16, height 5' 4.5" (1.638 m), weight 86.2 kg, last menstrual period 01/06/2021, SpO2 100 %. Body mass index is 32.11 kg/m.  Mental Status Per Nursing Assessment::   On Admission:  Suicidal ideation indicated by patient  Demographic Factors:  Low socioeconomic status   Loss Factors: Decrease in vocational status, Loss of significant relationship and Financial problems/change in socioeconomic status  Historical Factors: Prior suicide attempts, Family history of mental illness or substance abuse and Impulsivity  Risk Reduction Factors:   Responsible for children under 41 years of age, Sense of responsibility to family, Employed, Living with another person, especially a relative, Positive social support and Positive therapeutic relationship  Continued Clinical Symptoms:  Anxiety - improving Depression - improving Alcohol/Substance Use/Abuse/Dependencies Previous Psychiatric Diagnoses and Treatments  Cognitive Features That Contribute To Risk:  None    Suicide Risk:  Minimal: No identifiable suicidal ideation.  Patients presenting with no risk factors but with morbid ruminations; may be classified as minimal risk based on the severity of the depressive symptoms   Follow-up Information    [facility] Go on 01/31/2021.   Why: You have an appointment for medication management services on 01/31/21 at 11:00 am.  This appointment  will be held in person.  Contact information: [address] [place] [address] [phone]  BEHAVIORAL HEALTH INTENSIVE PSYCH Follow up on 01/21/2021.   Specialty: Behavioral Health Why: You have an appointment to for intensive outpatient therapy services on 01/21/21 at 9:00 am.   This will be a Virtual appointment.  Contact information: [phone] [id number] [address] [place] [address] [phone]        [facility]. Call.   Why: Please call to make an appointment for your medical needs.  Contact information: [address] [place] [address] [phone]              Plan Of Care/Follow-up recommendations:  Activity:  As tolerated  Tests:   -You will periodically need to have blood drawn for labs to check your cholesterol and blood sugar levels while you are taking olanzapine.  Your outpatient doctor will order this lab work and will let you know when blood needs to be drawn.  Other:   -Take medications as prescribed.   -Do not drink alcohol.  Do not use marijuana or other drugs.   -Keep outpatient mental health appointments with therapist, psychiatrist and intensive outpatient program groups.   -See your primary care provider regarding treatment of any medical conditions.Claudie Revering, MD 01/13/2021, 10:46 AM

## 2021-01-13 NOTE — Progress Notes (Signed)
Recreation Therapy Notes  Date:  5.23.22 Time: 0930 Location: [address] Dayroom  Group Topic: Stress Management  Goal Area(s) Addresses:  Patient will identify positive stress management techniques. Patient will identify benefits of using stress management post d/c.  Intervention: Stress Management  Activity:  Meditation.  LRT played a meditation that focused on calming anxiety.  Patients were to listen and follow along as meditation played to fully engage in activity.  Education:  Stress Management, Discharge Planning.   Education Outcome: Acknowledges Education  Clinical Observations/Feedback: Pt did not attend group session.   Kethan Papadopoulos, LRT/CTRS         Lindy Garczynski A 01/13/2021 12:14 PM 

## 2021-01-13 NOTE — Progress Notes (Signed)
Discharge Note:  Patient discharged home with husband.  Patient denied SI and HI.  Denied A/V hallucinations.  Suicide prevention information given and discussed with patient who stated she understood and had no questions.  Patient stated she received all her belongings, clothing, toiletries, misc items, etc.  Patient stated she appreciated all assistance received from [facility] staff.  All required discharge information given.

## 2021-01-13 NOTE — Tx Team (Signed)
Interdisciplinary Treatment and Diagnostic Plan Update  01/13/2021 Time of Session: 9:20am  Marie Myers MRN: [id number]  Principal Diagnosis: MDD (major depressive disorder), recurrent severe, without psychosis (HCC)  Secondary Diagnoses: Principal Problem:   MDD (major depressive disorder), recurrent severe, without psychosis (HCC) Active Problems:   Moderate alcohol use disorder (HCC)   Marijuana use   Current Medications:  Current Facility-Administered Medications  Medication Dose Route Frequency Provider Last Rate Last Admin  . acetaminophen (TYLENOL) tablet 650 mg  650 mg Oral Q6H PRN Antonieta Pert, MD   650 mg at 01/12/21 1559  . alum & mag hydroxide-simeth (MAALOX/MYLANTA) 200-200-20 MG/5ML suspension 30 mL  30 mL Oral Q4H PRN Antonieta Pert, MD      . dicyclomine (BENTYL) tablet 20 mg  20 mg Oral BID AC & HS Antonieta Pert, MD   20 mg at 01/13/21 0714  . folic acid (FOLVITE) tablet 1 mg  1 mg Oral Daily Antonieta Pert, MD   1 mg at 01/13/21 0715  . gabapentin (NEURONTIN) capsule 200 mg  200 mg Oral TID Oneta Rack, NP   200 mg at 01/13/21 0713  . hydrOXYzine (ATARAX/VISTARIL) tablet 50 mg  50 mg Oral Q6H PRN Armandina Stammer I, NP   50 mg at 01/13/21 0721  . lisinopril (ZESTRIL) tablet 5 mg  5 mg Oral Daily Armandina Stammer I, NP   5 mg at 01/13/21 0716  . magnesium hydroxide (MILK OF MAGNESIA) suspension 30 mL  30 mL Oral Daily PRN Antonieta Pert, MD      . metoCLOPramide (REGLAN) tablet 5 mg  5 mg Oral Q8H PRN Claudie Revering, MD   5 mg at 01/13/21 0720  . multivitamin with minerals tablet 1 tablet  1 tablet Oral Daily Antonieta Pert, MD   1 tablet at 01/13/21 0715  . OLANZapine (ZYPREXA) tablet 5 mg  5 mg Oral QHS Claudie Revering, MD   5 mg at 01/12/21 2125  . pantoprazole (PROTONIX) EC tablet 80 mg  80 mg Oral Daily Antonieta Pert, MD   80 mg at 01/13/21 0715  . sertraline (ZOLOFT) tablet 100 mg  100 mg Oral Daily Oneta Rack, NP   100 mg at  01/13/21 0713  . thiamine tablet 100 mg  100 mg Oral Daily Antonieta Pert, MD   100 mg at 01/13/21 0716  . traZODone (DESYREL) tablet 100 mg  100 mg Oral QHS Armandina Stammer I, NP   100 mg at 01/12/21 2125  . vitamin B-12 (CYANOCOBALAMIN) tablet 1,000 mcg  1,000 mcg Oral Daily Armandina Stammer I, NP   1,000 mcg at 01/13/21 1007   PTA Medications: Medications Prior to Admission  Medication Sig Dispense Refill Last Dose  . acetaminophen (TYLENOL) 500 MG tablet Take 2 tablets (1,000 mg total) by mouth every 8 (eight) hours as needed for moderate pain.  0   . amitriptyline (ELAVIL) 100 MG tablet Take 100 mg by mouth at bedtime as needed for sleep.     [name] dicyclomine (BENTYL) 20 MG tablet Take 20 mg by mouth in the morning and at bedtime.     . diphenhydrAMINE (BENADRYL) 25 MG tablet Take 25 mg by mouth every 6 (six) hours as needed for itching or allergies (headaches).     . metoCLOPramide (REGLAN) 10 MG tablet Take 0.5-1 tablets (5-10 mg total) by mouth every 8 (eight) hours as needed for nausea. 30 tablet 0   . omeprazole (PRILOSEC) 20  MG capsule Take 40 mg by mouth daily.       Patient Stressors: [employer] fast streamer difficulties Occupational concerns Substance abuse  Patient Strengths: Ability for insight Average or above average intelligence Capable of independent living Wellsite geologist fund of knowledge Motivation for treatment/growth Physical Health Supportive family/friends Work skills  Treatment Modalities: Medication Management, Group therapy, Case management,  1 to 1 session with clinician, Psychoeducation, Recreational therapy.   Physician Treatment Plan for Primary Diagnosis: MDD (major depressive disorder), recurrent severe, without psychosis (HCC) Long Term Goal(s): Improvement in symptoms so as ready for discharge Improvement in symptoms so as ready for discharge   Short Term Goals: Ability to identify changes in lifestyle to reduce recurrence of  condition will improve Ability to verbalize feelings will improve Ability to disclose and discuss suicidal ideas Ability to demonstrate self-control will improve Ability to identify and develop effective coping behaviors will improve Compliance with prescribed medications will improve Ability to identify triggers associated with substance abuse/mental health issues will improve  Medication Management: Evaluate patient's response, side effects, and tolerance of medication regimen.  Therapeutic Interventions: 1 to 1 sessions, Unit Group sessions and Medication administration.  Evaluation of Outcomes: Adequate for Discharge  Physician Treatment Plan for Secondary Diagnosis: Principal Problem:   MDD (major depressive disorder), recurrent severe, without psychosis (HCC) Active Problems:   Moderate alcohol use disorder (HCC)   Marijuana use  Long Term Goal(s): Improvement in symptoms so as ready for discharge Improvement in symptoms so as ready for discharge   Short Term Goals: Ability to identify changes in lifestyle to reduce recurrence of condition will improve Ability to verbalize feelings will improve Ability to disclose and discuss suicidal ideas Ability to demonstrate self-control will improve Ability to identify and develop effective coping behaviors will improve Compliance with prescribed medications will improve Ability to identify triggers associated with substance abuse/mental health issues will improve     Medication Management: Evaluate patient's response, side effects, and tolerance of medication regimen.  Therapeutic Interventions: 1 to 1 sessions, Unit Group sessions and Medication administration.  Evaluation of Outcomes: Adequate for Discharge   RN Treatment Plan for Primary Diagnosis: MDD (major depressive disorder), recurrent severe, without psychosis (HCC) Long Term Goal(s): Knowledge of disease and therapeutic regimen to maintain health will improve  Short  Term Goals: Ability to remain free from injury will improve, Ability to demonstrate self-control, Ability to participate in decision making will improve, Ability to verbalize feelings will improve, Ability to disclose and discuss suicidal ideas and Ability to identify and develop effective coping behaviors will improve  Medication Management: RN will administer medications as ordered by provider, will assess and evaluate patient's response and provide education to patient for prescribed medication. RN will report any adverse and/or side effects to prescribing provider.  Therapeutic Interventions: 1 on 1 counseling sessions, Psychoeducation, Medication administration, Evaluate responses to treatment, Monitor vital signs and CBGs as ordered, Perform/monitor CIWA, COWS, AIMS and Fall Risk screenings as ordered, Perform wound care treatments as ordered.  Evaluation of Outcomes: Adequate for Discharge   LCSW Treatment Plan for Primary Diagnosis: MDD (major depressive disorder), recurrent severe, without psychosis (HCC) Long Term Goal(s): Safe transition to appropriate next level of care at discharge, Engage patient in therapeutic group addressing interpersonal concerns.  Short Term Goals: Engage patient in aftercare planning with referrals and resources, Increase social support, Increase emotional regulation, Facilitate acceptance of mental health diagnosis and concerns, Identify triggers associated with mental health/substance abuse issues and Increase skills for  wellness and recovery  Therapeutic Interventions: Assess for all discharge needs, 1 to 1 time with Social worker, Explore available resources and support systems, Assess for adequacy in community support network, Educate family and significant other(s) on suicide prevention, Complete Psychosocial Assessment, Interpersonal group therapy.  Evaluation of Outcomes: Adequate for Discharge   Progress in Treatment: Attending groups:  Yes. Participating in groups: Yes. Taking medication as prescribed: Yes. Toleration medication: Yes. Family/Significant other contact made: Yes, individual(s) contacted:  If consents are provided  Patient understands diagnosis: No. Discussing patient identified problems/goals with staff: Yes. Medical problems stabilized or resolved: Yes. Denies suicidal/homicidal ideation: Yes. Issues/concerns per patient self-inventory: No.   New problem(s) identified: No, Describe:  None   New Short Term/Long Term Goal(s): medication stabilization, elimination of SI thoughts, development of comprehensive mental wellness plan.   Patient Goals: "To get my mental health right and focus on myself"  Discharge Plan or Barriers: Patient is returning home and is to follow up with IOP through [facility].  Reason for Continuation of Hospitalization: Medication stabilization  Estimated Length of Stay: Adequate for discharge.  Attendees: Patient:  01/08/2021   Physician:  01/08/2021   Nursing:  01/08/2021   RN Care Manager: 01/08/2021   Social Worker: Ruthann Cancer, LCSW 01/08/2021   Recreational Therapist:  01/08/2021   Other:  01/08/2021   Other:  01/08/2021   Other: 01/08/2021     Scribe for Treatment Team: Otelia Santee, LCSW 01/13/2021 11:52 AM

## 2021-01-13 NOTE — Progress Notes (Signed)
D:  Patient denied SI and HI, contracts for safety.  Denied A/V hallucinations.  Denied pain. A:  Medications administered per MD orders.  Emotional support and encouragement. R:  Safety maintained with 15 minutes.

## 2021-01-13 NOTE — Progress Notes (Signed)
  [facility] Adult Case Management Discharge Plan :  Will you be returning to the same living situation after discharge:  Yes,  personal home At discharge, do you have transportation home?: Yes,  with fiancee Do you have the ability to pay for your medications: Yes,  private insurance  Release of information consent forms completed and in the chart;  Patient's signature needed at discharge.  Patient to Follow up at:  Follow-up Information    [facility] Go on 01/31/2021.   Why: You have an appointment for medication management services on 01/31/21 at 11:00 am.  This appointment will be held in person.  Contact information: [address] [place] [address] [phone]        BEHAVIORAL HEALTH INTENSIVE PSYCH Follow up on 01/21/2021.   Specialty: Behavioral Health Why: You have an appointment to for intensive outpatient therapy services on 01/21/21 at 9:00 am.   This will be a Virtual appointment.  Contact information: [address] [phone] mc [place] [address] [phone]       [facility]. Call.   Why: Please call to make an appointment for your medical needs.  Contact information: [address] [place] [address] [phone]              Next level of care provider has access to [facility] Link:yes  Safety Planning and Suicide Prevention discussed: Yes,  fiance     Has patient been referred to the Quitline?: N/A patient is not a smoker  Patient has been referred for addiction treatment: Pt. refused referral  Chrys Racer 01/13/2021, 10:28 AM

## 2021-01-13 NOTE — Progress Notes (Signed)
   01/12/21 2150  COVID-19 Daily Checkoff  Have you had a fever (temp > 37.80C/100F)  in the past 24 hours?  No  If you have had runny nose, nasal congestion, sneezing in the past 24 hours, has it worsened? No  COVID-19 EXPOSURE  Have you traveled outside the state in the past 14 days? No  Have you been in contact with someone with a confirmed diagnosis of COVID-19 or PUI in the past 14 days without wearing appropriate PPE? No  Have you been living in the same home as a person with confirmed diagnosis of COVID-19 or a PUI (household contact)? No  Have you been diagnosed with COVID-19? No   

## 2021-01-13 NOTE — Progress Notes (Signed)
   01/12/21 2150  Psychosocial Assessment  Eye Contact Fair  Facial Expression Anxious  Affect Anxious;Sad;Appropriate to circumstance  Speech Logical/coherent  Interaction Assertive  Motor Activity Other (Comment) (wnl)  Appearance/Hygiene Unremarkable  Thought Process  Coherency WDL  Content Blaming others  Delusions None reported or observed  Perception WDL  Hallucination None reported or observed  Judgment Limited  Confusion None  Danger to Self  Current suicidal ideation? Denies  Self-Injurious Behavior No self-injurious ideation or behavior indicators observed or expressed   Agreement Not to Harm Self Yes  Description of Agreement agrees to notify staff immediately for any thoughts of hurting herself or anyone else  Danger to Others  Danger to Others None reported or observed

## 2021-01-13 NOTE — Discharge Summary (Signed)
Physician Discharge Summary Note  Patient:  Marie Myers is an 30 y.o., female MRN:  [id number] DOB:  1990-12-03 Patient phone:  [phone] (home)  Patient address:   [place] [address],  Total Time spent with patient: 30 minutes  Date of Admission:  01/07/2021 Date of Discharge: 01/13/2021  Reason for Admission:  (From MD's admission and SRA notes): Patient's case discussed in detail with members of the treatment team. I met with and evaluated the patient in the office on the unit today. Marie Myers a 30 year old female with a history of depression and anxiety who presented voluntarily and unaccompanied to [facility] emergency department on 01/06/2021 with chief complaint of increased depressive symptoms and worsening suicidal ideation with a plan to kill herself by overdose on pills in combination with alcohol. Urine drug screen in the ED was positive for THC. The patient reported that she had been drinking wine at night to help herself fall asleep. She was transferred to [facility] the evening of 01/07/2021 for further treatment and stabilization of her depressive symptoms and suicidal ideation. During interview with me today, the patient presents with depressed mood, constricted and intermittently tearful affect and soft speech. She reports that she has been experiencing suicidal thoughts for a period of months but they have increased in intensity and frequency over the last week. She reports that she has been feeling more helpless, miserable, irritable and at times becomes extremely agitated and upset upset and engages in throwing stuff and cursing at her home. She endorses symptoms of sad mood, insomnia, decreased interest, poor motivation, decreased concentration, low energy, anxiety and suicidal ideation. She states that she was having thoughts about taking an overdose on pills in combination with alcohol. The patient reports recent  stressors of job loss, financial stressors, issues with her mother, being away from family and multiple recent family deaths which she is grieving. The patient states that she stopped herself from attempting suicide in sought help because of her son. The patient experiences auditory hallucinations when she attempts to go to sleep of mumbling sounds, screams for help or sounds as though the TV is on. She reports visual hallucinations of seeing shadows at night. The patient denies AI, HI, PI. She denies any history of childhood trauma.   The patient reports that she has been using alcohol on a nightly basis in an effort to help her self sleep. She estimates that she drinks 4-5 tall cups of wine most nights and on other nights she may drink up to 6 tall cups of wine. She denies ever experiencing any symptoms of alcohol withdrawal and denies any current symptoms of nausea vomiting, tremor, headache. Patient also reports daily use of marijuana throughout the day and estimates she smokes 1 clip over the course of the day which helps reduce her anxiety.   Patient reports a history of 1 prior suicide attempt 3 to 4 years ago by overdose on medications in combination with alcohol. She was admitted following that suicide attempt to [facility] for approximately 7 to 10 days. She states she was not discharged on medication. She currently does not take any psychiatric medications. She has a therapist Marie Myers whom she sees infrequently and was unable to get another appointment to see until June. She denies any history of nonsuicidal self-injurious behavior. The patient denies any history of periods meeting criteria for hypomania or mania.   The patient reports that her sister has been diagnosed with bipolar disorder  and her mother has some sort of mental health issues. The patient denies any family history of suicide. She reports that her sister has had substance issues and some of her  paternal relatives have problems with alcohol. The patient denies any history of seizure or head injury with concussion or loss of consciousness.  During our conversation the patient states that she still has passive wishes not to be alive but she states she can remain safe in the hospital. She is interested in starting medication for depression and anxiety. She would also like to get connected with a therapist who can help her with grief counseling.  Evaluation on the unit: Patient was seen and evaluated. Patient stated she has not had time to get a new PCP and requests prescriptions for her medical medications she has been on here in the hospital. Patient denies SI/HI/AVH, paranoia and delusions. She reported she is sleeping well, she slept 6.25 hours last night. 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 She has been compliant with her medications. She is somatically focused much of the time. She is calm and cooperative. She will return home after discharge. She denies any thoughts of self-harm or access to weapons. She has follow up appointments listed in her discharge paperwork. She agrees to follow up with her outpatient providers and to take her medications. Patient is stable for discharge today.   Principal Problem: MDD (major depressive disorder), recurrent severe, without psychosis ([place]) Discharge Diagnoses: Principal Problem:   MDD (major depressive disorder), recurrent severe, without psychosis ([place]) Active Problems:   Moderate alcohol use disorder (HCC)   Marijuana use   Past Psychiatric History: See H&P  Past Medical History:  Past Medical History:  Diagnosis Date  . Marijuana use 01/08/2021  . Moderate alcohol use disorder ([place]) 01/08/2021    Past Surgical History:  Procedure Laterality Date  . CHOLECYSTECTOMY    . GASTRIC BYPASS    . LAPAROSCOPY N/A 08/11/2019   Procedure: LAPAROSCOPY DIAGNOSTIC;   Surgeon: Erroll Luna, MD;  Location: [place];  Service: General;  Laterality: N/A;   Family History: History reviewed. No pertinent family history. Family Psychiatric  History: See H&P Social History:  Social History   Substance and Sexual Activity  Alcohol Use Yes   Comment: occasionally     Social History   Substance and Sexual Activity  Drug Use Never    Social History   Socioeconomic History  . Marital status: Single    Spouse name: Not on file  . Number of children: Not on file  . Years of education: Not on file  . Highest education level: Not on file  Occupational History  . Not on file  Tobacco Use  . Smoking status: Never Smoker  . Smokeless tobacco: Never Used  Substance and Sexual Activity  . Alcohol use: Yes    Comment: occasionally  . Drug use: Never  . Sexual activity: Not on file  Other Topics Concern  . Not on file  Social History Narrative  . Not on file   Social Determinants of Health   Financial Resource Strain: Not on file  Food Insecurity: Not on file  Transportation Needs: Not on file  Physical Activity: Not on file  Stress: Not on file  Social Connections: Not on file    Hospital Course:  After the above admission evaluation, Marie Myers's presenting symptoms were noted. She was recommended for mood stabilization treatments. The medication regimen  targeting those presenting symptoms were discussed with her & initiated with her consent. She was placed on Zyprexa at bedtime for mood stabilization and Zoloft for depression. Her UDS on arrival to the ED was positive for THC, her BAL was <10. She was medicated, stabilized & discharged on the medications as listed on her discharge medication list below. Besides the mood stabilization treatments, Marie Myers was also enrolled & participated in the group counseling sessions being offered & held on this unit. She learned coping skills. She presented no other significant pre-existing medical issues that required  treatment. She tolerated his treatment regimen without any adverse effects or reactions reported.   During the course of her hospitalization, the 15-minute checks were adequate to ensure patient's safety. Marie Myers did not display any dangerous, violent or suicidal behavior on the unit. She interacted with patients & staff appropriately, participated appropriately in the group sessions/therapies. Her medications were addressed & adjusted to meet her needs. She was recommended for outpatient follow-up care & medication management upon discharge to assure continuity of care & mood stability.  At the time of discharge patient is not reporting any acute suicidal/homicidal ideations. She feels more confident about her self-care & in managing his mental health. She currently denies any new issues or concerns. Education and supportive counseling provided throughout her hospital stay & upon discharge.   Today upon her discharge evaluation with the attending psychiatrist, Marie Myers shares she is doing well. She denies any other specific concerns. She is sleeping well. Her appetite is good. She denies other physical complaints. She denies AH/VH, delusional thoughts or paranoia. She does not appear to be responding to any internal stimuli. She feels that her medications have been helpful & is in agreement to continue her current treatment regimen as recommended. She was able to engage in safety planning including plan to return to [facility] or contact emergency services if she feels unable to maintain her own safety or the safety of others. Pt had no further questions, comments, or concerns. She left [facility] with all personal belongings in no apparent distress. Transportation per private vehicle.   Physical Findings: AIMS: Facial and Oral Movements Muscles of Facial Expression: None, normal Lips and Perioral Area: None, normal Jaw: None, normal Tongue: None, normal,Extremity Movements Upper (arms, wrists, hands, fingers): None,  normal Lower (legs, knees, ankles, toes): None, normal, Trunk Movements Neck, shoulders, hips: None, normal, Overall Severity Severity of abnormal movements (highest score from questions above): None, normal Incapacitation due to abnormal movements: None, normal Patient's awareness of abnormal movements (rate only patient's report): No Awareness, Dental Status Current problems with teeth and/or dentures?: No Does patient usually wear dentures?: No  CIWA:  CIWA-Ar Total: 1 COWS:     Musculoskeletal: Strength & Muscle Tone: within normal limits Gait & Station: normal Patient leans: N/A  Psychiatric Specialty Exam:  Presentation  General Appearance: Appropriate for Environment; Casual  Eye Contact:Good  Speech:Clear and Coherent; Normal Rate  Speech Volume:Normal  Handedness:Right  Mood and Affect  Mood:Euthymic  Affect:Congruent  Thought Process  Thought Processes:Coherent; Goal Directed; Linear  Descriptions of Associations:Intact  Orientation:Full (Time, Place and Person)  Thought Content:Logical  History of Schizophrenia/Schizoaffective disorder:No data recorded Duration of Psychotic Symptoms:No data recorded Hallucinations:Hallucinations: None  Ideas of Reference:None  Suicidal Thoughts:Suicidal Thoughts: No  Homicidal Thoughts:Homicidal Thoughts: No  Sensorium  Memory:Immediate Good; Recent Good; Remote Good  Judgment:Fair  Insight:Fair  Executive Functions  Concentration:Good  Attention Span:Good  [place] of Knowledge:Good  Language:Good  Psychomotor Activity  Psychomotor Activity:Psychomotor Activity: Normal  Assets  Assets:Communication Skills; Desire for Improvement; Housing; Resilience; Social Support  Sleep  Sleep:Sleep: Good Number of Hours of Sleep: 6.25   Physical Exam: Physical Exam Vitals and nursing note reviewed.  Constitutional:      Appearance: Normal appearance.  HENT:     Head: Normocephalic.   Pulmonary:     Effort: Pulmonary effort is normal.  Musculoskeletal:        General: Normal range of motion.     Cervical back: Normal range of motion.  Neurological:     Mental Status: She is alert and oriented to person, place, and time.  Psychiatric:        Attention and Perception: Attention normal. She does not perceive auditory or visual hallucinations.        Mood and Affect: Mood normal.        Speech: Speech normal.        Behavior: Behavior normal. Behavior is cooperative.        Thought Content: Thought content normal. Thought content is not paranoid or delusional. Thought content does not include homicidal or suicidal ideation. Thought content does not include homicidal or suicidal plan.        Cognition and Memory: Cognition normal.    Review of Systems  Constitutional: Negative for fever.  HENT: Negative for congestion and sore throat.   Respiratory: Negative for cough and shortness of breath.   Cardiovascular: Negative for chest pain.  Gastrointestinal: Negative.   Genitourinary: Negative.   Musculoskeletal: Negative.   Neurological: Negative.    Blood pressure (!) 135/99, pulse 76, temperature 98.4 F (36.9 C), temperature source Oral, resp. rate 16, height 5' 4.5" (1.638 m), weight 86.2 kg, last menstrual period 01/06/2021, SpO2 100 %. Body mass index is 32.11 kg/m.  Has this patient used any form of tobacco in the last 30 days? (Cigarettes, Smokeless Tobacco, Cigars, and/or Pipes) Yes, N/A  Blood Alcohol level:  Lab Results  Component Value Date   ETH <10 49/44/9675    Metabolic Disorder Labs:  Lab Results  Component Value Date   HGBA1C 4.7 (L) 01/08/2021   MPG 88.19 01/08/2021   No results found for: PROLACTIN Lab Results  Component Value Date   CHOL 166 01/08/2021   TRIG 46 01/08/2021   HDL 119 01/08/2021   CHOLHDL 1.4 01/08/2021   VLDL 9 01/08/2021   [place] 38 01/08/2021    See Psychiatric Specialty Exam and Suicide Risk Assessment  completed by Attending Physician prior to discharge.  Discharge destination:  Home  Is patient on multiple antipsychotic therapies at discharge:  No   Has Patient had three or more failed trials of antipsychotic monotherapy by history:  No  Recommended Plan for Multiple Antipsychotic Therapies: NA  Discharge Instructions    Diet - low sodium heart healthy   Complete by: As directed    Increase activity slowly   Complete by: As directed      Allergies as of 01/13/2021      Reactions   Zithromax [azithromycin] Hives      Medication List    STOP taking these medications   acetaminophen 500 MG tablet Commonly known as: TYLENOL   amitriptyline 100 MG tablet Commonly known as: ELAVIL   diphenhydrAMINE 25 MG tablet Commonly known as: BENADRYL     TAKE these medications     Indication  cyanocobalamin 1000 MCG tablet Take 1 tablet (1,000 mcg total) by mouth daily. Start taking on: Jan 14, 2021  Indication: Inadequate Vitamin B12   dicyclomine 20 MG tablet Commonly known as: BENTYL Take 1 tablet (20 mg total) by mouth 2 (two) times daily at 8 am and 10 pm. What changed: when to take this  Indication: Irritable Bowel Syndrome   folic acid 1 MG tablet Commonly known as: FOLVITE Take 1 tablet (1 mg total) by mouth daily. Start taking on: Jan 14, 2021  Indication: Anemia From Inadequate Folic Acid   gabapentin 100 MG capsule Commonly known as: NEURONTIN Take 2 capsules (200 mg total) by mouth 3 (three) times daily.  Indication: Alcohol Withdrawal Syndrome, Agitation   hydrOXYzine 50 MG tablet Commonly known as: ATARAX/VISTARIL Take 1 tablet (50 mg total) by mouth every 6 (six) hours as needed for anxiety (Anxiety).  Indication: Feeling Anxious   lisinopril 5 MG tablet Commonly known as: ZESTRIL Take 1 tablet (5 mg total) by mouth daily. Start taking on: Jan 14, 2021  Indication: High Blood Pressure Disorder   metoCLOPramide 5 MG tablet Commonly known as:  Reglan Take 1 tablet (5 mg total) by mouth every 8 (eight) hours as needed for nausea. What changed:   medication strength  how much to take  Indication: Delayed or Stopped Emptying of Stomach, Nausea and Vomiting   multivitamin with minerals Tabs tablet Take 1 tablet by mouth daily. Start taking on: Jan 14, 2021  Indication: Nutritional Support   OLANZapine 5 MG tablet Commonly known as: ZYPREXA Take 1 tablet (5 mg total) by mouth at bedtime.  Indication: Depressive Phase of Manic-Depression   omeprazole 20 MG capsule Commonly known as: PRILOSEC Take 40 mg by mouth daily.  Indication: Gastroesophageal Reflux Disease   sertraline 100 MG tablet Commonly known as: ZOLOFT Take 1 tablet (100 mg total) by mouth daily. Start taking on: Jan 14, 2021  Indication: Major Depressive Disorder   thiamine 100 MG tablet Take 1 tablet (100 mg total) by mouth daily. Start taking on: Jan 14, 2021  Indication: Deficiency of Vitamin B1   traZODone 100 MG tablet Commonly known as: DESYREL Take 1 tablet (100 mg total) by mouth at bedtime.  Indication: [place]. Go on 01/31/2021.   Why: You have an appointment for medication management services on 01/31/21 at 11:00 am.  This appointment will be held in person.  Contact information: [place] [address] [place] [facility] Follow up on 01/21/2021.   Specialty: Behavioral Health Why: You have an appointment to for intensive outpatient therapy services on 01/21/21 at 9:00 am.   This will be a Virtual appointment.  Contact information: [place] [id number] [place]. Call.   Why: Please call to make an appointment for your medical needs.  Contact information: [address] [place]  [phone]              Follow-up recommendations:  Activity:  as tolerated Diet:  Heart healthy  Comments:  Prescriptions were given at discharge.  Patient agreeable to plan.  Patient was given opportunity to ask questions.  She appears to feel comfortable with discharge and denies any current suicidal or homicidal thoughts.   Patient is instructed prior to discharge to: Take all medications as prescribed by her mental healthcare provider. Report any  adverse effects and or reactions from the medicines to her outpatient provider promptly. Patient has been instructed & cautioned: To not engage in alcohol and or illegal drug use while on prescription medicines. In the event of worsening symptoms, patient is instructed to call the crisis hotline, 911 and or go to the nearest ED for appropriate evaluation and treatment of symptoms. To follow-up with her primary care provider for your other medical issues, concerns and or health care needs.  Signed: Ethelene Hal, NP 01/13/2021, 3:23 PM

## 2021-01-13 NOTE — [facility] Group Notes (Signed)
ADULT GRIEF GROUP NOTE:   Spiritual care group on grief and loss facilitated by chaplain Dyanne Carrel, [facility]   Group Goal:   Support / Education around grief and loss   Members engage in facilitated group support and psycho-social education.   Group Description:   Following introductions and group rules, group members engaged in facilitated group dialog and support around topic of loss, with particular support around experiences of loss in their lives. Group Identified types of loss (relationships / self / things) and identified patterns, circumstances, and changes that precipitate losses. Reflected on thoughts / feelings around loss, normalized grief responses, and recognized variety in grief experience. Group noted Worden's four tasks of grief in discussion.   Group drew on Adlerian / Rogerian, narrative, MI,   Patient Progress:  Marie Myers was an active participant in group.  She shared about the many losses that she has experienced in recent years.  She stated that she felt better talking about them, naming them aloud and sharing about her experiences.  She felt that this group was what she needed before leaving and experienced it as her sister and her nephew (both of whom have died) calling her to this group so that she could share before she discharges.  I gave her information for grief support through [facility] as well as Grief [facility].  Chaplain Dyanne Carrel, [facility] Pager, [phone] 4:57 PM

## 2021-01-16 ENCOUNTER — Telehealth (HOSPITAL_COMMUNITY): Payer: Self-pay | Admitting: Psychiatry

## 2021-01-16 NOTE — Telephone Encounter (Signed)
D:  Pt was referred per Haywood Lasso (Child psychotherapist from [facility]).  A:  Placed call to orient pt.  Pt declined MH-IOP due to a schedule conflict.  "I am in school from 9-12 noon, so I can't do your groups."  Inquired if pt has follow up appts? According to pt she has f/u appts thru [facility].  Encouraged pt to call case manager back if she changes her mind about attending group.  R: Pt receptive.

## 2021-01-19 ENCOUNTER — Emergency Department (HOSPITAL_COMMUNITY)
Admission: EM | Admit: 2021-01-19 | Discharge: 2021-01-19 | Disposition: A | Discharge status: Home / Self Care | Payer: 59 | Attending: Emergency Medicine | Admitting: Emergency Medicine

## 2021-01-19 ENCOUNTER — Emergency Department (HOSPITAL_COMMUNITY): Payer: 59

## 2021-01-19 ENCOUNTER — Encounter (HOSPITAL_COMMUNITY): Payer: Self-pay

## 2021-01-19 ENCOUNTER — Other Ambulatory Visit: Payer: Self-pay

## 2021-01-19 DIAGNOSIS — R112 Nausea with vomiting, unspecified: Secondary | ICD-10-CM | POA: Diagnosis not present

## 2021-01-19 DIAGNOSIS — Z9049 Acquired absence of other specified parts of digestive tract: Secondary | ICD-10-CM | POA: Insufficient documentation

## 2021-01-19 DIAGNOSIS — R1010 Upper abdominal pain, unspecified: Secondary | ICD-10-CM

## 2021-01-19 DIAGNOSIS — R1013 Epigastric pain: Secondary | ICD-10-CM | POA: Diagnosis present

## 2021-01-19 LAB — CBC WITH DIFFERENTIAL/PLATELET
Abs Immature Granulocytes: 0.08 10*3/uL — ABNORMAL HIGH (ref 0.00–0.07)
Basophils Absolute: 0.1 10*3/uL (ref 0.0–0.1)
Basophils Relative: 0 %
Eosinophils Absolute: 0 10*3/uL (ref 0.0–0.5)
Eosinophils Relative: 0 %
HCT: 38.4 % (ref 36.0–46.0)
Hemoglobin: 13.4 g/dL (ref 12.0–15.0)
Immature Granulocytes: 1 %
Lymphocytes Relative: 4 %
Lymphs Abs: 0.7 10*3/uL (ref 0.7–4.0)
MCH: 36.8 pg — ABNORMAL HIGH (ref 26.0–34.0)
MCHC: 34.9 g/dL (ref 30.0–36.0)
MCV: 105.5 fL — ABNORMAL HIGH (ref 80.0–100.0)
Monocytes Absolute: 0.8 10*3/uL (ref 0.1–1.0)
Monocytes Relative: 5 %
Neutro Abs: 14.7 10*3/uL — ABNORMAL HIGH (ref 1.7–7.7)
Neutrophils Relative %: 90 %
Platelets: 342 10*3/uL (ref 150–400)
RBC: 3.64 MIL/uL — ABNORMAL LOW (ref 3.87–5.11)
RDW: 12.6 % (ref 11.5–15.5)
WBC: 16.3 10*3/uL — ABNORMAL HIGH (ref 4.0–10.5)
nRBC: 0 % (ref 0.0–0.2)

## 2021-01-19 LAB — COMPREHENSIVE METABOLIC PANEL
ALT: 22 U/L (ref 0–44)
AST: 29 U/L (ref 15–41)
Albumin: 4.7 g/dL (ref 3.5–5.0)
Alkaline Phosphatase: 68 U/L (ref 38–126)
Anion gap: 12 (ref 5–15)
BUN: <5 mg/dL — ABNORMAL LOW (ref 6–20)
CO2: 25 mmol/L (ref 22–32)
Calcium: 9.8 mg/dL (ref 8.9–10.3)
Chloride: 97 mmol/L — ABNORMAL LOW (ref 98–111)
Creatinine, Ser: 0.68 mg/dL (ref 0.44–1.00)
GFR, Estimated: >60 mL/min (ref >60)
Glucose, Bld: 175 mg/dL — ABNORMAL HIGH (ref 70–99)
Potassium: 3.6 mmol/L (ref 3.5–5.1)
Sodium: 134 mmol/L — ABNORMAL LOW (ref 135–145)
Total Bilirubin: 0.4 mg/dL (ref 0.3–1.2)
Total Protein: 8.1 g/dL (ref 6.5–8.1)

## 2021-01-19 LAB — I-STAT BETA HCG BLOOD, ED (MC, WL, AP ONLY): I-stat hCG, quantitative: <5 m[IU]/mL (ref <5)

## 2021-01-19 LAB — LIPASE, BLOOD: Lipase: 21 U/L (ref 11–51)

## 2021-01-19 MED ORDER — PROMETHAZINE HCL 25 MG/ML IJ SOLN
25.0000 mg | Freq: Four times a day (QID) | INTRAMUSCULAR | Status: DC | PRN
  Administered 2021-01-19: 25 mg via INTRAMUSCULAR
  Filled 2021-01-19: qty 1

## 2021-01-19 MED ORDER — SUCRALFATE 1 GM/10ML PO SUSP: 1.0000 g | Freq: Three times a day (TID) | ORAL | 0 refills | Status: DC

## 2021-01-19 MED ORDER — DIPHENHYDRAMINE HCL 50 MG/ML IJ SOLN
25.0000 mg | Freq: Once | INTRAMUSCULAR | Status: AC
  Administered 2021-01-19: 25 mg via INTRAVENOUS
  Filled 2021-01-19: qty 1

## 2021-01-19 MED ORDER — SODIUM CHLORIDE 0.9 % IV BOLUS
1000.0000 mL | Freq: Once | INTRAVENOUS | Status: AC
  Administered 2021-01-19: 1000 mL via INTRAVENOUS

## 2021-01-19 MED ORDER — DROPERIDOL 2.5 MG/ML IJ SOLN
1.2500 mg | Freq: Once | INTRAMUSCULAR | Status: AC
  Administered 2021-01-19: 1.25 mg via INTRAVENOUS
  Filled 2021-01-19: qty 2

## 2021-01-19 MED ORDER — ONDANSETRON 4 MG PO TBDP: 4.0000 mg | ORAL_TABLET | Freq: Three times a day (TID) | ORAL | 0 refills | Status: DC | PRN

## 2021-01-19 MED ORDER — FENTANYL CITRATE (PF) 100 MCG/2ML IJ SOLN
100.0000 ug | Freq: Once | INTRAMUSCULAR | Status: AC
  Administered 2021-01-19: 100 ug via INTRAVENOUS
  Filled 2021-01-19: qty 2

## 2021-01-19 MED ORDER — LORAZEPAM 2 MG/ML IJ SOLN
2.0000 mg | Freq: Once | INTRAMUSCULAR | Status: AC
  Administered 2021-01-19: 2 mg via INTRAMUSCULAR
  Filled 2021-01-19: qty 1

## 2021-01-19 NOTE — ED Notes (Signed)
Unsuccessful attempt to draw labs. 

## 2021-01-19 NOTE — ED Notes (Signed)
Pt given water, saltines, and graham crackers.

## 2021-01-19 NOTE — ED Triage Notes (Signed)
Pt came in with c/o abdominal pain and n/v times ten hours. Pt has hx of gastric bypass. Pt has appendix, but had cholecystectomy. Endorses diarrhea for two hours

## 2021-01-19 NOTE — ED Provider Notes (Signed)
[facility]-EMERGENCY DEPT Provider Note   CSN: [id number] Arrival date & time: 01/19/21  9417     History Chief Complaint  Patient presents with  . Abdominal Pain    Marie Myers is a 30 y.o. female presenting for evaluation of n/v and abd pain.   Patient states that the past 12 hours she has had upper abdominal pain, nausea, vomiting.  She has a history of similar, thought to be related to her previous gastric bypass surgery.  She tried to take her home Reglan and Bentyl, but had subsequent vomiting.  She reports chills, but no fevers.  No chest pain or shortness of breath.  No urinary symptoms.  She reports 2 episodes of diarrhea this morning.  No hematemesis, hematochezia, melena.  She is sexually active with 1 female partner, does not use condoms or contraception.  Her last period ended last week, was normal for her.  No vaginal discharge.  Per chart review, patient was recently started on thiamine, sertraline, lisinopril, multivitamin, folic acid, vitamin B12.   Patient states she is follows with [name], last visit was last month.  Her gastric bypass surgery was in 2016 in [place].  Per chart review, patient with multiple visits in the last 72-months for nausea, vomiting, abdominal pain.  Last CT was in February 2022, negative for acute findings but did show a small hiatal hernia.   HPI     Past Medical History:  Diagnosis Date  . Marijuana use 01/08/2021  . Moderate alcohol use disorder (HCC) 01/08/2021    Patient Active Problem List   Diagnosis Date Noted  . Moderate alcohol use disorder (HCC) 01/08/2021  . Marijuana use 01/08/2021  . 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 Metabolic acidemia 10/21/2020  . Tetrahydrocannabinol (THC) dependence (HCC) 10/16/2020  . Acute lower UTI 10/16/2020  . Tachypnea 10/16/2020  . Dehydration 10/16/2020  . Hypertensive urgency 10/16/2020  .  Intractable nausea and vomiting 10/16/2020  . Abdominal pain 08/11/2019    Past Surgical History:  Procedure Laterality Date  . CHOLECYSTECTOMY    . GASTRIC BYPASS    . LAPAROSCOPY N/A 08/11/2019   Procedure: LAPAROSCOPY DIAGNOSTIC;  Surgeon: Harriette Bouillon, MD;  Location: [facility] OR;  Service: General;  Laterality: N/A;     OB History   No obstetric history on file.     History reviewed. No pertinent family history.  Social History   Tobacco Use  . Smoking status: Never Smoker  . Smokeless tobacco: Never Used  Substance Use Topics  . Alcohol use: Yes    Comment: occasionally  . Drug use: Never    Home Medications Prior to Admission medications   Medication Sig Start Date End Date Taking? Authorizing Provider  ondansetron (ZOFRAN ODT) 4 MG disintegrating tablet Take 1 tablet (4 mg total) by mouth every 8 (eight) hours as needed for nausea or vomiting. 01/19/21  Yes Clarine Elrod, PA-C  sucralfate (CARAFATE) 1 GM/10ML suspension Take 10 mLs (1 g total) by mouth 4 (four) times daily -  with meals and at bedtime. 01/19/21  Yes Zayn Selley, PA-C  dicyclomine (BENTYL) 20 MG tablet Take 1 tablet (20 mg total) by mouth 2 (two) times daily at 8 am and 10 pm. 01/13/21   Laveda Abbe, NP  folic acid (FOLVITE) 1 MG tablet Take 1 tablet (1 mg total) by mouth daily. 01/14/21   Laveda Abbe, NP  gabapentin (NEURONTIN) 100 MG capsule Take 2  capsules (200 mg total) by mouth 3 (three) times daily. 01/13/21 02/12/21  Laveda AbbeParks, Laurie Britton, NP  hydrOXYzine (ATARAX/VISTARIL) 50 MG tablet Take 1 tablet (50 mg total) by mouth every 6 (six) hours as needed for anxiety (Anxiety). 01/13/21   Laveda AbbeParks, Laurie Britton, NP  lisinopril (ZESTRIL) 5 MG tablet Take 1 tablet (5 mg total) by mouth daily. 01/14/21   Laveda AbbeParks, Laurie Britton, NP  metoCLOPramide (REGLAN) 5 MG tablet Take 1 tablet (5 mg total) by mouth every 8 (eight) hours as needed for nausea. 01/13/21   Laveda AbbeParks, Laurie Britton, NP   Multiple Vitamin (MULTIVITAMIN WITH MINERALS) TABS tablet Take 1 tablet by mouth daily. 01/14/21   Laveda AbbeParks, Laurie Britton, NP  OLANZapine (ZYPREXA) 5 MG tablet Take 1 tablet (5 mg total) by mouth at bedtime. 01/13/21   Laveda AbbeParks, Laurie Britton, NP  omeprazole (PRILOSEC) 20 MG capsule Take 40 mg by mouth daily.    [provider]  sertraline (ZOLOFT) 100 MG tablet Take 1 tablet (100 mg total) by mouth daily. 01/14/21   Laveda AbbeParks, Laurie Britton, NP  thiamine 100 MG tablet Take 1 tablet (100 mg total) by mouth daily. 01/14/21   Laveda AbbeParks, Laurie Britton, NP  traZODone (DESYREL) 100 MG tablet Take 1 tablet (100 mg total) by mouth at bedtime. 01/13/21   Laveda AbbeParks, Laurie Britton, NP  vitamin B-12 1000 MCG tablet Take 1 tablet (1,000 mcg total) by mouth daily. 01/14/21   Laveda AbbeParks, Laurie Britton, NP    Allergies    Zithromax [azithromycin]  Review of Systems   Review of Systems  Gastrointestinal: Positive for abdominal pain, diarrhea, nausea and vomiting.  All other systems reviewed and are negative.   Physical Exam Updated Vital Signs BP 121/77   Pulse 81   Temp 98.4 F (36.9 C) (Oral)   Resp 16   Ht 5\' 4"  (1.626 m)   Wt 86.2 kg   LMP 01/06/2021   SpO2 99%   BMI 32.61 kg/m   Physical Exam Vitals and nursing note reviewed.  Constitutional:      General: She is not in acute distress.    Appearance: She is well-developed.     Comments: Appears uncomfortable due to pain. Not in acute distress  HENT:     Head: Normocephalic and atraumatic.  Eyes:     Extraocular Movements: Extraocular movements intact.     Conjunctiva/sclera: Conjunctivae normal.     Pupils: Pupils are equal, round, and reactive to light.  Cardiovascular:     Rate and Rhythm: Normal rate and regular rhythm.     Pulses: Normal pulses.  Pulmonary:     Effort: Pulmonary effort is normal. No respiratory distress.     Breath sounds: Normal breath sounds. No wheezing.  Abdominal:     General: There is no distension.      Palpations: Abdomen is soft. There is no mass.     Tenderness: There is abdominal tenderness in the epigastric area. There is no guarding or rebound.  Musculoskeletal:        General: Normal range of motion.     Cervical back: Normal range of motion and neck supple.  Skin:    General: Skin is warm and dry.     Capillary Refill: Capillary refill takes less than 2 seconds.  Neurological:     Mental Status: She is alert and oriented to person, place, and time.     ED Results / Procedures / Treatments   Labs (all labs ordered are listed, but only abnormal results  are displayed) Labs Reviewed  CBC WITH DIFFERENTIAL/PLATELET - Abnormal; Notable for the following components:      Result Value   WBC 16.3 (*)    RBC 3.64 (*)    MCV 105.5 (*)    MCH 36.8 (*)    Neutro Abs 14.7 (*)    Abs Immature Granulocytes 0.08 (*)    All other components within normal limits  COMPREHENSIVE METABOLIC PANEL - Abnormal; Notable for the following components:   Sodium 134 (*)    Chloride 97 (*)    Glucose, Bld 175 (*)    BUN <5 (*)    All other components within normal limits  LIPASE, BLOOD  I-STAT BETA HCG BLOOD, ED ([facility], [facility], [facility] ONLY)    EKG None  Radiology DG Abdomen Acute W/Chest  Result Date: 01/19/2021 CLINICAL DATA:  Abdominal pain rule out perforation EXAM: DG ABDOMEN ACUTE WITH 1 VIEW CHEST COMPARISON:  None. FINDINGS: There is no evidence of dilated bowel loops or free intraperitoneal air. No radiopaque calculi or other significant radiographic abnormality is seen. Heart size and mediastinal contours are within normal limits. Both lungs are clear. IMPRESSION: 1.  No acute abnormality of the lungs. 2. Nonobstructive pattern of bowel gas. No free air in the abdomen. Electronically Signed   By: Lauralyn Primes M.D.   On: 01/19/2021 09:31    Procedures Procedures   Medications Ordered in ED Medications  promethazine (PHENERGAN) injection 25 mg (25 mg Intramuscular Given 01/19/21 0744)  sodium  chloride 0.9 % bolus 1,000 mL (1,000 mLs Intravenous New Bag/Given 01/19/21 0800)  droperidol (INAPSINE) 2.5 MG/ML injection 1.25 mg (1.25 mg Intravenous Given 01/19/21 0742)  diphenhydrAMINE (BENADRYL) injection 25 mg (25 mg Intravenous Given 01/19/21 0741)  LORazepam (ATIVAN) injection 2 mg (2 mg Intramuscular Given 01/19/21 0744)  fentaNYL (SUBLIMAZE) injection 100 mcg (100 mcg Intravenous Given 01/19/21 0902)    ED Course  I have reviewed the triage vital signs and the nursing notes.  Pertinent labs & imaging results that were available during my care of the patient were reviewed by me and considered in my medical decision making (see chart for details).    MDM Rules/Calculators/A&P                          Patient presenting for evaluation nausea, vomiting, abdominal pain.  On exam, patient appears uncomfortable due to pain, but otherwise nontoxic.  She does have tenderness palpation of the epigastric abdomen.  No rigidity, guarding, distention.  Negative rebound.  No peritonitis.  Will order labs to ensure no significant change from baseline.  Will hold off on CT, as patient has had several in the past couple months.  This is likely related to her cyclical vomiting.  Will treat symptomatically and reassess.  Labs interpreted by me, mild leukocytosis of 16, but otherwise reassuring.  Kidney, liver, pancreatic function normal.  On reevaluation, patient reports improvement in nausea, but she still has upper abdominal pain.  In the setting of continued pain, will continue to treat symptomatically and obtain an acute abdomen and chest to ensure no free air.  Acute abdomen negative for free air or acute findings.  On reevaluation, patient reports significant improvement of symptoms.  She is eating crackers well and in the room without signs of nausea, pain, or distress.  Discussed continued symptomatic management and close follow-up outpatient with her GI doctor.  At this time, patient appears safe  for discharge.  Return precautions  given.  Patient states she understands and agrees to plan.  Final Clinical Impression(s) / ED Diagnoses Final diagnoses:  Non-intractable vomiting with nausea, unspecified vomiting type  Upper abdominal pain    Rx / DC Orders ED Discharge Orders         Ordered    sucralfate (CARAFATE) 1 GM/10ML suspension  3 times daily with meals & bedtime        01/19/21 1019    ondansetron (ZOFRAN ODT) 4 MG disintegrating tablet  Every 8 hours PRN        01/19/21 1019           Naiyah Klostermann, PA-C 01/19/21 1025    Rolan Bucco, MD 01/19/21 1233

## 2021-01-19 NOTE — Discharge Instructions (Addendum)
Continue taking home medications as prescribed. You Zofran as needed for nausea or vomiting. Make sure stay well-hydrated water.  Eat a bland diet until symptoms improve. You may take Carafate before meals and for night to help prevent pain for the next couple days. Follow-up with your GI doctor for further evaluation of your symptoms. Return to the emergency room if you develop high fevers, persistent vomiting despite medication, severe worsening pain, or any new, worsening, or concerning symptoms

## 2021-02-09 ENCOUNTER — Other Ambulatory Visit (HOSPITAL_COMMUNITY): Payer: Self-pay | Admitting: Psychiatry

## 2021-02-11 NOTE — Telephone Encounter (Signed)
Patient should see her outpatient provider for refills.  

## 2022-01-09 ENCOUNTER — Encounter (HOSPITAL_COMMUNITY): Payer: Self-pay

## 2022-01-09 ENCOUNTER — Other Ambulatory Visit: Payer: Self-pay

## 2022-01-09 ENCOUNTER — Emergency Department (HOSPITAL_COMMUNITY)
Admission: EM | Admit: 2022-01-09 | Discharge: 2022-01-10 | Disposition: A | Discharge status: Other Acute Inpatient Hospital | Payer: Medicaid Other | Source: Home / Self Care | Attending: Student | Admitting: Student

## 2022-01-09 DIAGNOSIS — R4589 Other symptoms and signs involving emotional state: Secondary | ICD-10-CM

## 2022-01-09 DIAGNOSIS — F1020 Alcohol dependence, uncomplicated: Secondary | ICD-10-CM | POA: Insufficient documentation

## 2022-01-09 DIAGNOSIS — F1290 Cannabis use, unspecified, uncomplicated: Secondary | ICD-10-CM | POA: Insufficient documentation

## 2022-01-09 DIAGNOSIS — Z20822 Contact with and (suspected) exposure to covid-19: Secondary | ICD-10-CM | POA: Insufficient documentation

## 2022-01-09 DIAGNOSIS — F332 Major depressive disorder, recurrent severe without psychotic features: Secondary | ICD-10-CM | POA: Insufficient documentation

## 2022-01-09 DIAGNOSIS — F1910 Other psychoactive substance abuse, uncomplicated: Secondary | ICD-10-CM | POA: Insufficient documentation

## 2022-01-09 DIAGNOSIS — F1990 Other psychoactive substance use, unspecified, uncomplicated: Secondary | ICD-10-CM

## 2022-01-09 DIAGNOSIS — R45851 Suicidal ideations: Secondary | ICD-10-CM | POA: Insufficient documentation

## 2022-01-09 HISTORY — DX: Insomnia, unspecified: G47.00

## 2022-01-09 HISTORY — DX: Depression, unspecified: F32.A

## 2022-01-09 HISTORY — DX: Anxiety disorder, unspecified: F41.9

## 2022-01-09 LAB — ACETAMINOPHEN LEVEL: Acetaminophen (Tylenol), Serum: 35 ug/mL — ABNORMAL HIGH (ref 10–30)

## 2022-01-09 LAB — RAPID URINE DRUG SCREEN, HOSP PERFORMED
Amphetamines: NOT DETECTED
Barbiturates: NOT DETECTED
Benzodiazepines: NOT DETECTED
Cocaine: NOT DETECTED
Opiates: NOT DETECTED
Tetrahydrocannabinol: POSITIVE — AB

## 2022-01-09 LAB — COMPREHENSIVE METABOLIC PANEL
ALT: 162 U/L — ABNORMAL HIGH (ref 0–44)
AST: 309 U/L — ABNORMAL HIGH (ref 15–41)
Albumin: 3.9 g/dL (ref 3.5–5.0)
Alkaline Phosphatase: 74 U/L (ref 38–126)
Anion gap: 12 (ref 5–15)
BUN: 8 mg/dL (ref 6–20)
CO2: 23 mmol/L (ref 22–32)
Calcium: 8.5 mg/dL — ABNORMAL LOW (ref 8.9–10.3)
Chloride: 105 mmol/L (ref 98–111)
Creatinine, Ser: 0.63 mg/dL (ref 0.44–1.00)
GFR, Estimated: >60 mL/min (ref >60)
Glucose, Bld: 66 mg/dL — ABNORMAL LOW (ref 70–99)
Potassium: 3.9 mmol/L (ref 3.5–5.1)
Sodium: 140 mmol/L (ref 135–145)
Total Bilirubin: 1 mg/dL (ref 0.3–1.2)
Total Protein: 6.7 g/dL (ref 6.5–8.1)

## 2022-01-09 LAB — CBC
HCT: 37.8 % (ref 36.0–46.0)
Hemoglobin: 12.8 g/dL (ref 12.0–15.0)
MCH: 38.6 pg — ABNORMAL HIGH (ref 26.0–34.0)
MCHC: 33.9 g/dL (ref 30.0–36.0)
MCV: 113.9 fL — ABNORMAL HIGH (ref 80.0–100.0)
Platelets: 179 10*3/uL (ref 150–400)
RBC: 3.32 MIL/uL — ABNORMAL LOW (ref 3.87–5.11)
RDW: 14.5 % (ref 11.5–15.5)
WBC: 9.4 10*3/uL (ref 4.0–10.5)
nRBC: 0 % (ref 0.0–0.2)

## 2022-01-09 LAB — I-STAT BETA HCG BLOOD, ED (MC, WL, AP ONLY): I-stat hCG, quantitative: <5 m[IU]/mL (ref <5)

## 2022-01-09 LAB — ETHANOL: Alcohol, Ethyl (B): <10 mg/dL (ref <10)

## 2022-01-09 LAB — SALICYLATE LEVEL: Salicylate Lvl: <7 mg/dL — ABNORMAL LOW (ref 7.0–30.0)

## 2022-01-09 MED ORDER — LORAZEPAM 0.5 MG PO TABS
0.5000 mg | ORAL_TABLET | Freq: Once | ORAL | Status: AC
  Administered 2022-01-09: 0.5 mg via ORAL
  Filled 2022-01-09: qty 1

## 2022-01-09 MED ORDER — IBUPROFEN 800 MG PO TABS
800.0000 mg | ORAL_TABLET | Freq: Once | ORAL | Status: AC
  Administered 2022-01-09: 800 mg via ORAL
  Filled 2022-01-09: qty 1

## 2022-01-09 MED ORDER — MELATONIN 5 MG PO TABS
5.0000 mg | ORAL_TABLET | Freq: Once | ORAL | Status: AC
  Administered 2022-01-09: 5 mg via ORAL
  Filled 2022-01-09: qty 1

## 2022-01-09 MED ORDER — ONDANSETRON 4 MG PO TBDP
4.0000 mg | ORAL_TABLET | Freq: Once | ORAL | Status: AC
  Administered 2022-01-09: 4 mg via ORAL
  Filled 2022-01-09: qty 1

## 2022-01-09 NOTE — ED Notes (Signed)
Patient requesting something to help her sleep, as being in the hallway is making her anxious.  PA Smoot made aware.

## 2022-01-09 NOTE — ED Triage Notes (Addendum)
Patient states she is suicidal and her plan is "to take a whole bunch of pills." Patient is very tearful in triage.   Patient denies HI  and states that she "feels like she is in a dream that she can not get out of."  Patient states she smoked marijuana this AM and states she drinks wine until she falls asleep.

## 2022-01-09 NOTE — ED Notes (Signed)
Patient stated to this RN, "If the psych consult isn't done by midnight, I'm leaving."  Patient voiced frustrations to this RN about being in the hallway with no phone and no TV.  This RN explained the process to patient and let patient voice her frustrations.  PA made aware.

## 2022-01-09 NOTE — ED Notes (Signed)
Per Diplomatic Services operational officer, [facility] not answering lines to check on status of TTS.

## 2022-01-09 NOTE — ED Provider Notes (Signed)
[facility]-EMERGENCY DEPT Provider Note   CSN: [id number] Arrival date & time: 01/09/22  1417     History  Chief Complaint  Patient presents with   Suicidal    Marie Myers is a 31 y.o. female who presents the emergency department for suicidal ideation.  Patient has a history of major depressive disorder and suicide attempt by intentional overdose.  She currently is feeling severely suicidal over the past several days.  She has had worsening depression for the past month.  Patient states that she has been calling out of work.  She states she cannot go to sleep and that she drinks heavily.  She drinks heavily every night to go to sleep.  She does not feel dependent on alcohol.  She uses marijuana.  She states that she cannot stop crying.  She has not been eating or drinking or taking care of her hygiene.  Patient has a plan of suicide to overdose intentionally.  She is here voluntarily.  HPI     Home Medications Prior to Admission medications   Medication Sig Start Date End Date Taking? Authorizing Provider  dicyclomine (BENTYL) 20 MG tablet Take 1 tablet (20 mg total) by mouth 2 (two) times daily at 8 am and 10 pm. 01/13/21   Laveda Abbe, NP  folic acid (FOLVITE) 1 MG tablet Take 1 tablet (1 mg total) by mouth daily. 01/14/21   Laveda Abbe, NP  gabapentin (NEURONTIN) 100 MG capsule Take 2 capsules (200 mg total) by mouth 3 (three) times daily. 01/13/21 02/12/21  Laveda Abbe, NP  hydrOXYzine (ATARAX/VISTARIL) 50 MG tablet Take 1 tablet (50 mg total) by mouth every 6 (six) hours as needed for anxiety (Anxiety). 01/13/21   Laveda Abbe, NP  lisinopril (ZESTRIL) 5 MG tablet Take 1 tablet (5 mg total) by mouth daily. 01/14/21   Laveda Abbe, NP  metoCLOPramide (REGLAN) 5 MG tablet Take 1 tablet (5 mg total) by mouth every 8 (eight) hours as needed for nausea. 01/13/21   Laveda Abbe, NP  Multiple Vitamin (MULTIVITAMIN  WITH MINERALS) TABS tablet Take 1 tablet by mouth daily. 01/14/21   Laveda Abbe, NP  OLANZapine (ZYPREXA) 5 MG tablet Take 1 tablet (5 mg total) by mouth at bedtime. 01/13/21   Laveda Abbe, NP  omeprazole (PRILOSEC) 20 MG capsule Take 40 mg by mouth daily.    [provider]  ondansetron (ZOFRAN ODT) 4 MG disintegrating tablet Take 1 tablet (4 mg total) by mouth every 8 (eight) hours as needed for nausea or vomiting. 01/19/21   Caccavale, Sophia, PA-C  sertraline (ZOLOFT) 100 MG tablet Take 1 tablet (100 mg total) by mouth daily. 01/14/21   Laveda Abbe, NP  sucralfate (CARAFATE) 1 GM/10ML suspension Take 10 mLs (1 g total) by mouth 4 (four) times daily -  with meals and at bedtime. 01/19/21   Caccavale, Sophia, PA-C  thiamine 100 MG tablet Take 1 tablet (100 mg total) by mouth daily. 01/14/21   Laveda Abbe, NP  traZODone (DESYREL) 100 MG tablet Take 1 tablet (100 mg total) by mouth at bedtime. 01/13/21   Laveda Abbe, NP  vitamin B-12 1000 MCG tablet Take 1 tablet (1,000 mcg total) by mouth daily. 01/14/21   Laveda Abbe, NP      Allergies    Zithromax [azithromycin]    Review of Systems   Review of Systems  Physical Exam Updated Vital Signs BP (!) 147/101 (BP  Location: Right Arm)   Pulse 90   Temp 98 F (36.7 C) (Oral)   Resp 18   Ht 5\' 4"  (1.626 m)   Wt 79.4 kg   LMP 10/22/2021 (Approximate) Comment: Patient states irregular periods  SpO2 97%   BMI 30.04 kg/m  Physical Exam Vitals and nursing note reviewed.  Constitutional:      General: She is not in acute distress.    Appearance: She is well-developed. She is not diaphoretic.  HENT:     Head: Normocephalic and atraumatic.     Right Ear: External ear normal.     Left Ear: External ear normal.     Nose: Nose normal.     Mouth/Throat:     Mouth: Mucous membranes are moist.  Eyes:     General: No scleral icterus.    Conjunctiva/sclera: Conjunctivae normal.   Cardiovascular:     Rate and Rhythm: Normal rate and regular rhythm.     Heart sounds: Normal heart sounds. No murmur heard.   No friction rub. No gallop.  Pulmonary:     Effort: Pulmonary effort is normal. No respiratory distress.     Breath sounds: Normal breath sounds.  Abdominal:     General: Bowel sounds are normal. There is no distension.     Palpations: Abdomen is soft. There is no mass.     Tenderness: There is no abdominal tenderness. There is no guarding.  Musculoskeletal:     Cervical back: Normal range of motion.  Skin:    General: Skin is warm and dry.  Neurological:     Mental Status: She is alert and oriented to person, place, and time.  Psychiatric:        Attention and Perception: Attention normal.        Mood and Affect: Mood is depressed. Affect is flat and tearful.        Speech: Speech normal.        Behavior: Behavior is slowed. Behavior is cooperative.        Thought Content: Thought content includes suicidal ideation. Thought content includes suicidal plan.    ED Results / Procedures / Treatments   Labs (all labs ordered are listed, but only abnormal results are displayed) Labs Reviewed  CBC - Abnormal; Notable for the following components:      Result Value   RBC 3.32 (*)    MCV 113.9 (*)    MCH 38.6 (*)    All other components within normal limits  RAPID URINE DRUG SCREEN, HOSP PERFORMED - Abnormal; Notable for the following components:   Tetrahydrocannabinol POSITIVE (*)    All other components within normal limits  COMPREHENSIVE METABOLIC PANEL  ETHANOL  SALICYLATE LEVEL  ACETAMINOPHEN LEVEL  I-STAT BETA HCG BLOOD, ED ([facility], [facility], [facility] ONLY)    EKG None  Radiology No results found.  Procedures Procedures    Medications Ordered in ED Medications - No data to display  ED Course/ Medical Decision Making/ A&P                           Medical Decision Making Amount and/or Complexity of Data Reviewed Labs: ordered.    Final  Clinical Impression(s) / ED Diagnoses Final diagnoses:  Severe episode of recurrent major depressive disorder, without psychotic features (HCC)  Suicidal risk  Polysubstance use disorder    Rx / DC Orders ED Discharge Orders     None  Arthor Captain, PA-C 01/09/22 1555    Linwood Dibbles, MD 01/12/22 [phone]

## 2022-01-09 NOTE — ED Notes (Signed)
Pt given turkey sandwich and drink 

## 2022-01-09 NOTE — ED Provider Notes (Cosign Needed Addendum)
5Care handoff from [place], PA-C at shift change. Please see their note for further information.  Briefly: Patient presents voluntarily for SI with 1 month of worsening depression. Endorses regular alcohol and marijuana use. Endorses plan of SI with overdose.  Plan: Awaiting labs to be cleared for TTS consult.  UDS THC+, glucose noted to be 66 likely related to decreased po intake. Given juice and a sandwich. AST 309, ALT 162 likely related to recent alcohol use, she denies any RUQ tenderness, no emergent concerns. Acetaminophen 35, however patient states that she took 4 extra strength Tylenol this morning for a migraine.  Further emergent concerns at this time.  Patient is medically cleared for TTS consult.  11:10 PM: Patient is agitated and states that she wants to go home. She has not yet been assessed by TTS. Given her suicidal ideation with plan and history of previous suicide attempts, IVC paperwork at this time.  I have also put in for melatonin and Ativan to help with her agitation.  Patient is aware of this update.   Vear Clock 01/09/22 1638    Glendora Score, MD 01/09/22 1739    Aunika Kirsten, Shawn Route, PA-C 01/09/22 2311    Glendora Score, MD 01/10/22 [phone]

## 2022-01-09 NOTE — ED Notes (Signed)
Pt belongings collected and placed in cabinets at nurses station. Pt changed into purple scrubs.

## 2022-01-10 ENCOUNTER — Other Ambulatory Visit: Payer: Self-pay

## 2022-01-10 ENCOUNTER — Other Ambulatory Visit: Payer: Self-pay | Admitting: Psychiatry

## 2022-01-10 ENCOUNTER — Encounter (HOSPITAL_COMMUNITY): Payer: Self-pay | Admitting: Psychiatry

## 2022-01-10 ENCOUNTER — Inpatient Hospital Stay (HOSPITAL_COMMUNITY)
Admission: RE | Admit: 2022-01-10 | Discharge: 2022-01-14 | DRG: 885 | Disposition: A | Discharge status: Home / Self Care | Payer: Medicaid Other | Source: Intra-hospital | Attending: Psychiatry | Admitting: Psychiatry

## 2022-01-10 DIAGNOSIS — I10 Essential (primary) hypertension: Secondary | ICD-10-CM | POA: Diagnosis present

## 2022-01-10 DIAGNOSIS — Z20822 Contact with and (suspected) exposure to covid-19: Secondary | ICD-10-CM | POA: Diagnosis present

## 2022-01-10 DIAGNOSIS — Z818 Family history of other mental and behavioral disorders: Secondary | ICD-10-CM | POA: Diagnosis not present

## 2022-01-10 DIAGNOSIS — Z9884 Bariatric surgery status: Secondary | ICD-10-CM

## 2022-01-10 DIAGNOSIS — K219 Gastro-esophageal reflux disease without esophagitis: Secondary | ICD-10-CM | POA: Diagnosis present

## 2022-01-10 DIAGNOSIS — F332 Major depressive disorder, recurrent severe without psychotic features: Secondary | ICD-10-CM | POA: Diagnosis present

## 2022-01-10 DIAGNOSIS — F1910 Other psychoactive substance abuse, uncomplicated: Secondary | ICD-10-CM | POA: Diagnosis present

## 2022-01-10 DIAGNOSIS — R45851 Suicidal ideations: Secondary | ICD-10-CM | POA: Diagnosis present

## 2022-01-10 DIAGNOSIS — G4700 Insomnia, unspecified: Secondary | ICD-10-CM | POA: Diagnosis present

## 2022-01-10 DIAGNOSIS — Z9151 Personal history of suicidal behavior: Secondary | ICD-10-CM

## 2022-01-10 DIAGNOSIS — F1020 Alcohol dependence, uncomplicated: Secondary | ICD-10-CM | POA: Diagnosis present

## 2022-01-10 DIAGNOSIS — F419 Anxiety disorder, unspecified: Secondary | ICD-10-CM | POA: Diagnosis present

## 2022-01-10 DIAGNOSIS — D649 Anemia, unspecified: Secondary | ICD-10-CM | POA: Diagnosis present

## 2022-01-10 DIAGNOSIS — F1290 Cannabis use, unspecified, uncomplicated: Secondary | ICD-10-CM | POA: Diagnosis present

## 2022-01-10 DIAGNOSIS — Z79899 Other long term (current) drug therapy: Secondary | ICD-10-CM

## 2022-01-10 LAB — COMPREHENSIVE METABOLIC PANEL
ALT: 119 U/L — ABNORMAL HIGH (ref 0–44)
AST: 123 U/L — ABNORMAL HIGH (ref 15–41)
Albumin: 3.9 g/dL (ref 3.5–5.0)
Alkaline Phosphatase: 80 U/L (ref 38–126)
Anion gap: 10 (ref 5–15)
BUN: 11 mg/dL (ref 6–20)
CO2: 24 mmol/L (ref 22–32)
Calcium: 9.1 mg/dL (ref 8.9–10.3)
Chloride: 103 mmol/L (ref 98–111)
Creatinine, Ser: 0.8 mg/dL (ref 0.44–1.00)
GFR, Estimated: >60 mL/min (ref >60)
Glucose, Bld: 120 mg/dL — ABNORMAL HIGH (ref 70–99)
Potassium: 4.3 mmol/L (ref 3.5–5.1)
Sodium: 137 mmol/L (ref 135–145)
Total Bilirubin: 1.1 mg/dL (ref 0.3–1.2)
Total Protein: 6.9 g/dL (ref 6.5–8.1)

## 2022-01-10 LAB — RESP PANEL BY RT-PCR (FLU A&B, COVID) ARPGX2
Influenza A by PCR: NEGATIVE
Influenza B by PCR: NEGATIVE
SARS Coronavirus 2 by RT PCR: NEGATIVE

## 2022-01-10 LAB — ACETAMINOPHEN LEVEL: Acetaminophen (Tylenol), Serum: <10 ug/mL — ABNORMAL LOW (ref 10–30)

## 2022-01-10 MED ORDER — ZOLPIDEM TARTRATE 10 MG PO TABS
10.0000 mg | ORAL_TABLET | ORAL | Status: AC
  Administered 2022-01-10: 10 mg via ORAL
  Filled 2022-01-10 (×2): qty 1

## 2022-01-10 MED ORDER — OLANZAPINE 10 MG IM SOLR: 5.0000 mg | Freq: Four times a day (QID) | INTRAMUSCULAR | Status: DC | PRN

## 2022-01-10 MED ORDER — HYDROXYZINE HCL 25 MG PO TABS
50.0000 mg | ORAL_TABLET | Freq: Once | ORAL | Status: AC
  Administered 2022-01-10: 50 mg via ORAL
  Filled 2022-01-10 (×2): qty 2

## 2022-01-10 MED ORDER — THIAMINE HCL 100 MG PO TABS
100.0000 mg | ORAL_TABLET | Freq: Every day | ORAL | Status: DC
  Administered 2022-01-10 – 2022-01-14 (×5): 100 mg via ORAL
  Filled 2022-01-10 (×9): qty 1

## 2022-01-10 MED ORDER — LORAZEPAM 1 MG PO TABS: 1.0000 mg | ORAL_TABLET | ORAL | Status: DC | PRN

## 2022-01-10 MED ORDER — CHLORDIAZEPOXIDE HCL 5 MG PO CAPS
10.0000 mg | ORAL_CAPSULE | Freq: Three times a day (TID) | ORAL | Status: DC
  Administered 2022-01-10 – 2022-01-11 (×2): 10 mg via ORAL
  Filled 2022-01-10 (×2): qty 2

## 2022-01-10 MED ORDER — OLANZAPINE 5 MG PO TBDP
5.0000 mg | ORAL_TABLET | Freq: Four times a day (QID) | ORAL | Status: DC | PRN
  Administered 2022-01-10: 5 mg via ORAL
  Filled 2022-01-10: qty 1

## 2022-01-10 MED ORDER — NICOTINE 21 MG/24HR TD PT24
21.0000 mg | MEDICATED_PATCH | Freq: Every day | TRANSDERMAL | Status: DC
  Administered 2022-01-11 – 2022-01-13 (×2): 21 mg via TRANSDERMAL
  Filled 2022-01-10 (×8): qty 1

## 2022-01-10 MED ORDER — ONDANSETRON 4 MG PO TBDP
4.0000 mg | ORAL_TABLET | Freq: Three times a day (TID) | ORAL | Status: DC | PRN
  Administered 2022-01-11: 4 mg via ORAL
  Filled 2022-01-10: qty 1

## 2022-01-10 MED ORDER — SERTRALINE HCL 100 MG PO TABS
100.0000 mg | ORAL_TABLET | Freq: Every day | ORAL | Status: DC
  Administered 2022-01-11 – 2022-01-12 (×2): 100 mg via ORAL
  Filled 2022-01-10 (×4): qty 1

## 2022-01-10 MED ORDER — SERTRALINE HCL 50 MG PO TABS
100.0000 mg | ORAL_TABLET | Freq: Every day | ORAL | Status: DC
  Administered 2022-01-10: 100 mg via ORAL
  Filled 2022-01-10 (×2): qty 2

## 2022-01-10 MED ORDER — TRAZODONE HCL 50 MG PO TABS
50.0000 mg | ORAL_TABLET | Freq: Once | ORAL | Status: AC | PRN
  Administered 2022-01-10: 50 mg via ORAL
  Filled 2022-01-10: qty 1

## 2022-01-10 MED ORDER — HALOPERIDOL LACTATE 5 MG/ML IJ SOLN
5.0000 mg | Freq: Once | INTRAMUSCULAR | Status: AC
  Administered 2022-01-10: 5 mg via INTRAMUSCULAR
  Filled 2022-01-10: qty 1

## 2022-01-10 NOTE — [facility] Group Notes (Signed)
[facility] Group Notes:  (Nursing/MHT/Case Management/Adjunct)  Date:  01/10/2022  Time:  8:50 PM  Type of Therapy:   wrap up  Participation Level:  Active  Participation Quality:  Appropriate and Attentive  Affect:  Appropriate  Cognitive:  Appropriate  Insight:  Appropriate, Good, and Improving  Engagement in Group:  Developing/Improving  Modes of Intervention:  Discussion  Summary of Progress/Problems: PT says she was glad to finally be out of the hospital. She says she wants to work on getting out of her head.  Lorita Officer 01/10/2022, 8:50 PM
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
## 2022-01-10 NOTE — ED Notes (Addendum)
Patient currently back at bed, standing beside bed with arms crossed. Patient is quiet at this time. Sitter remains at bedside.

## 2022-01-10 NOTE — ED Notes (Signed)
[facility] to unit to transfer pt to [facility] per MD order. Personal property bag given to [facility] for transport. Pt ambulatory off unit in [facility] custody. No s/s of acute distress noted.

## 2022-01-10 NOTE — ED Notes (Signed)
Visitor at bedside.

## 2022-01-10 NOTE — ED Notes (Signed)
Patient moved to room 7 for TTS.

## 2022-01-10 NOTE — ED Notes (Signed)
[facility] called for transport to [facility] per MD order.

## 2022-01-10 NOTE — BH Assessment (Addendum)
Disposition:   Per Roselyn Bering, NP patient recommended for inpatient psychiatric care. Patient faxed to the following facilities.      Destination Service Provider Request Status Selected Services Address Phone Fax Patient Preferred  [facility]    [address], [place] [address] [phone] --  [facility]    [address] [place] [address] [phone] --  [facility]    [address] [place] [address] [phone] --  [facility] [address]    [address], [place] [address] [phone] --  [facility]    [address] [place], [place] [address] [phone] --  [facility]    [address] [place] [address] [phone] --  [facility]    [address] [place], [place] [address] [phone] --  [facility] [address] [address] [place] [address] [phone] --  [facility]    [address] [place] [address] [phone] --  [facility]    [address] [place], [place] [address] [phone] --  [facility]    [address] [place] [address] [phone] --  [facility]    [address], [place] [address] [phone] --  [facility]    [address], [place] [address] [phone] --  [facility]    [address] [place] [address] [phone] --  [facility]    [address] [place] [address] [phone] --  [facility]    [address] [place] [address] [phone] --  [facility]    [address], [place] [address]  [phone] --  [facility]    [address], [place] [address] [phone] --  [facility]    [address] [place] [address] [phone] --  [facility]    [address] [place] [address] [phone] --  [facility]    [address], [place] [address] [phone] --  [facility]    [address] [place], [place] [address] [phone] --  [facility]    [address], [place] [address] [phone] --  [facility]    [address], [place] [address] [phone] --  [facility]    [address] [place] [address] [phone] --  [facility]    [address] [place] [address] [phone] --  [facility]    [address] [place] [address] [phone] --

## 2022-01-10 NOTE — ED Notes (Signed)
Secretary to fax IVC papers to [facility]

## 2022-01-10 NOTE — Progress Notes (Signed)
Patient ID: Marie Myers, female   DOB: [birth year], 31 y.o.   MRN: [id number] Patient is admitted involuntarily after admitting that she has been thinking about committing suicide. Reports that she has problem with alcohol use and unable to stop drinking. Uses alcohol to manage her insomnia. Reports that she is supposed to be taking medications but could not afford the cost. She reports that she has developed alcohol dependency but has not experienced any other consequences yet. Patient reports increased depression and anxiety, irritability and decreased concentration. Reports that her husband and son are her primary support. Stressors include grief, money loss and sleep deprivation. She also admits to smoking marijuana and vaping. No current medical issues. Skin assessment perform by Clinical research associate, assisted by Penni Bombard, MHT. No major skin concerns noted. Pt is admitted and oriented to the unit and safety precautions initiated.

## 2022-01-10 NOTE — ED Notes (Signed)
Patient became angry when she couldn't leave because she was IVC'd.  Security called x 2 before response.  Patient then started pacing in the hallway, refusing to go back to her bed.  Patient allowed to voice frustrations about being in the hallway, and patient repeatedly stated that she wanted to leave and was uncooperative with staff directions.  Security showed up and advised patient that she had to go back to her bed.  Patient went back to bed and continued to be uncooperative with staff directions, refusing to take medications ordered, stating "if y'all are going to waste my time, I'm going to waste yours."  Medication wasted in "cactus" with Abby RN, signed off in pyxis.  Patient continues to be hostile towards staff, stating "y'all going to make me go off."  Multiple attempts made to redirect patient and de-escalate the situation.

## 2022-01-10 NOTE — ED Notes (Signed)
Order for haldol in, patient currently back asleep, in NAD.  Haldol will be given if patient wakes back up and requests it.

## 2022-01-10 NOTE — ED Notes (Addendum)
Patient reports that the [place] and atarax "worked for 20 mins and now isn't working anymore and she needs something stronger."  MD Pollina made aware.

## 2022-01-10 NOTE — ED Notes (Signed)
Al Corpus at [facility] secure chat, this RN asked about the status of TTS.

## 2022-01-10 NOTE — Tx Team (Signed)
Initial Treatment Plan 01/10/2022 6:01 PM [name] [id number]    PATIENT STRESSORS: Financial difficulties   Medication change or noncompliance   Substance abuse     PATIENT STRENGTHS: Ability for insight  Average or above average intelligence  Capable of independent living  Communication skills  General fund of knowledge  Physical Health    PATIENT IDENTIFIED PROBLEMS: depressed  Self-harm thoughts  Substance use  Medication non-adherence               DISCHARGE CRITERIA:  Ability to meet basic life and health needs Improved stabilization in mood, thinking, and/or behavior Motivation to continue treatment in a less acute level of care Verbal commitment to aftercare and medication compliance  PRELIMINARY DISCHARGE PLAN: Outpatient therapy Participate in family therapy Return to previous living arrangement  PATIENT/FAMILY INVOLVEMENT: This treatment plan has been presented to and reviewed with the patient, Marie Myers.  The patient has been given the opportunity to ask questions and make suggestions.  Olin Pia, RN 01/10/2022, 6:01 PM

## 2022-01-10 NOTE — Plan of Care (Signed)
Patient is adjusting well to the unit and expresses motivation for treatment. Continues to endorse passive SI and contracts for safety.

## 2022-01-11 DIAGNOSIS — F1020 Alcohol dependence, uncomplicated: Secondary | ICD-10-CM

## 2022-01-11 LAB — URINALYSIS, COMPLETE (UACMP) WITH MICROSCOPIC
Bilirubin Urine: NEGATIVE
Glucose, UA: NEGATIVE mg/dL
Hgb urine dipstick: NEGATIVE
Ketones, ur: 5 mg/dL — AB
Nitrite: POSITIVE — AB
Protein, ur: NEGATIVE mg/dL
Specific Gravity, Urine: 1.015 (ref 1.005–1.030)
pH: 6 (ref 5.0–8.0)

## 2022-01-11 MED ORDER — SULFAMETHOXAZOLE-TRIMETHOPRIM 800-160 MG PO TABS
1.0000 | ORAL_TABLET | Freq: Two times a day (BID) | ORAL | Status: DC
Start: 2022-01-11 — End: 2022-01-11

## 2022-01-11 MED ORDER — GABAPENTIN 100 MG PO CAPS
200.0000 mg | ORAL_CAPSULE | Freq: Three times a day (TID) | ORAL | Status: DC
  Administered 2022-01-11 – 2022-01-12 (×4): 200 mg via ORAL
  Filled 2022-01-11 (×9): qty 2

## 2022-01-11 MED ORDER — TRAZODONE HCL 100 MG PO TABS
100.0000 mg | ORAL_TABLET | Freq: Every day | ORAL | Status: DC
  Administered 2022-01-11 – 2022-01-12 (×2): 100 mg via ORAL
  Filled 2022-01-11 (×4): qty 1

## 2022-01-11 MED ORDER — OLANZAPINE 5 MG PO TABS
5.0000 mg | ORAL_TABLET | Freq: Every day | ORAL | Status: DC
  Administered 2022-01-11 – 2022-01-12 (×2): 5 mg via ORAL
  Filled 2022-01-11 (×4): qty 1

## 2022-01-11 MED ORDER — HYDROXYZINE HCL 50 MG PO TABS
50.0000 mg | ORAL_TABLET | Freq: Four times a day (QID) | ORAL | Status: DC | PRN
  Administered 2022-01-11 – 2022-01-14 (×8): 50 mg via ORAL
  Filled 2022-01-11 (×8): qty 1
  Filled 2022-01-11: qty 10

## 2022-01-11 MED ORDER — SUCRALFATE 1 GM/10ML PO SUSP
1.0000 g | Freq: Three times a day (TID) | ORAL | Status: DC
Start: 2022-01-11 — End: 2022-01-14
  Administered 2022-01-11 – 2022-01-14 (×13): 1 g via ORAL
  Filled 2022-01-11 (×20): qty 10

## 2022-01-11 MED ORDER — ADULT MULTIVITAMIN W/MINERALS CH
1.0000 | ORAL_TABLET | Freq: Every day | ORAL | Status: DC
  Administered 2022-01-11 – 2022-01-14 (×4): 1 via ORAL
  Filled 2022-01-11 (×7): qty 1

## 2022-01-11 MED ORDER — VITAMIN B-12 1000 MCG PO TABS
1000.0000 ug | ORAL_TABLET | Freq: Every day | ORAL | Status: DC
  Administered 2022-01-11 – 2022-01-14 (×4): 1000 ug via ORAL
  Filled 2022-01-11 (×7): qty 1

## 2022-01-11 MED ORDER — LISINOPRIL 5 MG PO TABS
5.0000 mg | ORAL_TABLET | Freq: Every day | ORAL | Status: DC
  Administered 2022-01-11 – 2022-01-14 (×4): 5 mg via ORAL
  Filled 2022-01-11 (×7): qty 1

## 2022-01-11 MED ORDER — FOLIC ACID 1 MG PO TABS
1.0000 mg | ORAL_TABLET | Freq: Every day | ORAL | Status: DC
  Administered 2022-01-11 – 2022-01-14 (×4): 1 mg via ORAL
  Filled 2022-01-11 (×7): qty 1

## 2022-01-11 NOTE — Progress Notes (Signed)
   01/11/22 2200  Psych Admission Type (Psych Patients Only)  Admission Status Involuntary  Psychosocial Assessment  Patient Complaints Anxiety  Eye Contact Fair  Facial Expression Flat  Affect Depressed  Speech Logical/coherent  Interaction Assertive  Motor Activity Slow  Appearance/Hygiene Improved  Behavior Characteristics Cooperative;Appropriate to situation  Mood Pleasant  Thought Process  Coherency WDL  Content WDL  Delusions None reported or observed  Perception WDL  Hallucination None reported or observed  Judgment Poor  Confusion None  Danger to Self  Current suicidal ideation? Denies  Self-Injurious Behavior No self-injurious ideation or behavior indicators observed or expressed   Agreement Not to Harm Self Yes  Description of Agreement verbal  Danger to Others  Danger to Others None reported or observed

## 2022-01-11 NOTE — [facility] Suicide Risk Assessment (Signed)
Suicide Risk Assessment  Admission Assessment    [facility] Admission Suicide Risk Assessment   Nursing information obtained from:  Patient  Demographic factors:  Adolescent or young adult  Current Mental Status:  Suicidal ideation indicated by patient  Loss Factors:  Financial problems / change in socioeconomic status  Historical Factors:  Family history of mental illness or substance abuse, Impulsivity  Risk Reduction Factors:  Responsible for children under 17 years of age, Living with another person, especially a relative  Total Time spent with patient: 1 hour  Principal Problem: MDD (major depressive disorder), recurrent episode, severe (HCC)  Diagnosis:  Principal Problem:   MDD (major depressive disorder), recurrent episode, severe (HCC)  Subjective Data: (Admission evaluation notes): 31 year old AA female with previous hx of major depressive disorder & alcohol use disorder. She was a patient in this [facility] in May of 2002 with similar complaints. She is being admitted this time around with complain of worsening symptoms of depression & suicidal ideations. She was brought to the [facility] for evaluation. After medical evaluation/clearance, Takyah was transferred to the [facility] for further psychiatric evaluation/treatments. Patient's lab results indicated an elevated liver enzymes (AST 123, ALT 119 & UDS was positive for THC. Her urinalysis results indicated presence of blood.   Continued Clinical Symptoms:  Alcohol Use Disorder Identification Test Final Score (AUDIT): 8 The "Alcohol Use Disorders Identification Test", Guidelines for Use in Primary Care, Second Edition.  World Science writer [facility]). Score between 0-7:  no or low risk or alcohol related problems. Score between 8-15:  moderate risk of alcohol related problems. Score between 16-19:  high risk of alcohol related problems. Score 20 or above:  warrants further diagnostic evaluation for alcohol dependence and  treatment.  CLINICAL FACTORS:   Severe Anxiety and/or Agitation Depression:   Hopelessness Insomnia Severe Alcohol/Substance Abuse/Dependencies More than one psychiatric diagnosis Previous Psychiatric Diagnoses and Treatments  Musculoskeletal: Strength & Muscle Tone: within normal limits Gait & Station: normal Patient leans: N/A  Psychiatric Specialty Exam:  Presentation  General Appearance: Appropriate for Environment; Casual  Eye Contact:Good  Speech:Clear and Coherent; Normal Rate  Speech Volume:Normal  Handedness:Right  Mood and Affect  Mood:Euthymic  Affect:Congruent  Thought Process  Thought Processes:Coherent; Goal Directed; Linear  Descriptions of Associations:Intact  Orientation:Full (Time, Place and Person)  Thought Content:Logical  History of Schizophrenia/Schizoaffective disorder:No  Duration of Psychotic Symptoms:No data recorded Hallucinations:No data recorded Ideas of Reference:None  Suicidal Thoughts:No data recorded Homicidal Thoughts:No data recorded  Sensorium  Memory:Immediate Good; Recent Good; Remote Good  Judgment:Fair  Insight:Fair  Executive Functions  Concentration:Good  Attention Span:Good  Recall:Good  Fund of Knowledge:Good  Language:Good  Psychomotor Activity  Psychomotor Activity:No data recorded  Assets  Assets:Communication Skills; Desire for Improvement; Housing; Resilience; Social Support  Sleep  Sleep:No data recorded  Physical Exam:  Blood pressure (!) 128/107, pulse 71, temperature 97.7 F (36.5 C), resp. rate 17, height 5' 4.5" (1.638 m), weight 85.6 kg, SpO2 100 %. Body mass index is 31.91 kg/m.  COGNITIVE FEATURES THAT CONTRIBUTE TO RISK:  Loss of executive function, Polarized thinking, and Thought constriction (tunnel vision)    SUICIDE RISK:   Severe:  Frequent, intense, and enduring suicidal ideation, specific plan, no subjective intent, but some objective markers of intent (i.e.,  choice of lethal method), the method is accessible, some limited preparatory behavior, evidence of impaired self-control, severe dysphoria/symptomatology, multiple risk factors present, and few if any protective factors, particularly a lack of social support.  PLAN  OF CARE: See H&P for plan of care.  I certify that inpatient services furnished can reasonably be expected to improve the patient's condition.   Armandina Stammer, NP, pmhnp, fnp-bc 01/11/2022, 9:04 AM

## 2022-01-11 NOTE — [facility] Group Notes (Signed)
Adult Psychoeducational Group Not Date:  01/11/2022 Time:  [id number] Group Topic/Focus: PROGRESSIVE RELAXATION. A group where deep breathing is taught and tensing and relaxation muscle groups is used. Imagery is used as well.  Pts are asked to imagine 3 pillars that hold them up when they are not able to hold themselves up and to share that with the group.  Participation Level:  did not attend  Paulino Rily
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
## 2022-01-11 NOTE — Progress Notes (Signed)
[facility] Group Notes:  (Nursing/MHT/Case Management/Adjunct)  Date:  01/11/2022  Time: 2015  Type of Therapy:   wrap up group  Participation Level:  Active  Participation Quality:  Appropriate, Attentive, Sharing, and Supportive  Affect:  Appropriate  Cognitive:  Alert  Insight:  Improving  Engagement in Group:  Engaged  Modes of Intervention:  Clarification, Education, and Support  Summary of Progress/Problems: Positive thinking and self-care were discussed.   Johann Capers S 01/11/2022, 9:00 PM

## 2022-01-11 NOTE — Plan of Care (Signed)
Nurse discussed anxiety, depression and coping skills with patient.  

## 2022-01-11 NOTE — Progress Notes (Signed)
   01/10/22 2300  Psych Admission Type (Psych Patients Only)  Admission Status Involuntary  Psychosocial Assessment  Patient Complaints Anxiety  Eye Contact Fair  Facial Expression Flat  Affect Depressed  Speech Soft  Interaction Assertive  Motor Activity Slow  Appearance/Hygiene In scrubs  Behavior Characteristics Appropriate to situation  Mood Pleasant  Thought Process  Coherency WDL  Content WDL  Delusions None reported or observed  Perception WDL  Hallucination None reported or observed  Judgment Poor  Confusion None  Danger to Self  Current suicidal ideation? Denies  Self-Injurious Behavior No self-injurious ideation or behavior indicators observed or expressed   Agreement Not to Harm Self Yes  Description of Agreement verbal  Danger to Others  Danger to Others None reported or observed

## 2022-01-11 NOTE — [facility] Group Notes (Signed)
.  Psychoeducational Group Note    Date:  5/21//23 Time: 1300-1400    Purpose of Group: . The group focus' on teaching patients on how to identify their needs and their Life Skills:  A group where two lists are made. What people need and what are things that we do that are unhealthy. The lists are developed by the patients and it is explained that we often do the actions that are not healthy to get our list of needs met.  Goal:: to develop the coping skills needed to get their needs met  Participation Level:  Active  Participation Quality:  Appropriate  Affect:  Appropriate  Cognitive:  Oriented  Insight:  Improving  Engagement in Group:  Engaged  Additional Comments: Rates her energy at a 7/10. Participated in the group.  Paulino Rily

## 2022-01-11 NOTE — Progress Notes (Signed)
D:  Patient's self inventory sheet, patient sleeps good, sleep medication helpful.  Fair appetite, low energy level, good concentration.  Denied withdrawals.  Denied SI.  Denied physical problem.  Denied physical pain. Goal is get out of my head, get better help with depression.  Listen, stay focused work on meds.  No discharge plans. A:  Medications administered per MD orders.  Emotional support and encouragement given patient.  R:  Denied SI and HI, contracts for safety.  Denied A/V hallucinations.  Safety maintained with 15 minute checks.
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
## 2022-01-12 ENCOUNTER — Encounter (HOSPITAL_COMMUNITY): Payer: Self-pay

## 2022-01-12 LAB — URINALYSIS, COMPLETE (UACMP) WITH MICROSCOPIC
Bilirubin Urine: NEGATIVE
Glucose, UA: NEGATIVE mg/dL
Hgb urine dipstick: NEGATIVE
Ketones, ur: 5 mg/dL — AB
Leukocytes,Ua: NEGATIVE
Nitrite: NEGATIVE
Protein, ur: NEGATIVE mg/dL
Specific Gravity, Urine: 1.018 (ref 1.005–1.030)
pH: 5 (ref 5.0–8.0)

## 2022-01-12 LAB — HEPATIC FUNCTION PANEL
ALT: 113 U/L — ABNORMAL HIGH (ref 0–44)
AST: 148 U/L — ABNORMAL HIGH (ref 15–41)
Albumin: 4.2 g/dL (ref 3.5–5.0)
Alkaline Phosphatase: 77 U/L (ref 38–126)
Bilirubin, Direct: 0.2 mg/dL (ref 0.0–0.2)
Indirect Bilirubin: 0.6 mg/dL (ref 0.3–0.9)
Total Bilirubin: 0.8 mg/dL (ref 0.3–1.2)
Total Protein: 7.3 g/dL (ref 6.5–8.1)

## 2022-01-12 LAB — HEPATITIS PANEL, ACUTE
HCV Ab: NONREACTIVE
Hep A IgM: NONREACTIVE
Hep B C IgM: NONREACTIVE
Hepatitis B Surface Ag: NONREACTIVE

## 2022-01-12 LAB — TSH: TSH: 2.338 u[IU]/mL (ref 0.350–4.500)

## 2022-01-12 LAB — LIPID PANEL
Cholesterol: 177 mg/dL (ref 0–200)
HDL: 119 mg/dL (ref >40)
LDL Cholesterol: 50 mg/dL (ref 0–99)
Total CHOL/HDL Ratio: 1.5 RATIO
Triglycerides: 42 mg/dL (ref <150)
VLDL: 8 mg/dL (ref 0–40)

## 2022-01-12 LAB — HEMOGLOBIN A1C
Hgb A1c MFr Bld: 4.5 % — ABNORMAL LOW (ref 4.8–5.6)
Mean Plasma Glucose: 82.45 mg/dL

## 2022-01-12 MED ORDER — SERTRALINE HCL 100 MG PO TABS
150.0000 mg | ORAL_TABLET | Freq: Every day | ORAL | Status: DC
Start: 2022-01-13 — End: 2022-01-14
  Administered 2022-01-13 – 2022-01-14 (×2): 150 mg via ORAL
  Filled 2022-01-12 (×4): qty 1

## 2022-01-12 MED ORDER — GABAPENTIN 300 MG PO CAPS
300.0000 mg | ORAL_CAPSULE | Freq: Three times a day (TID) | ORAL | Status: DC
  Administered 2022-01-12 – 2022-01-14 (×6): 300 mg via ORAL
  Filled 2022-01-12 (×11): qty 1

## 2022-01-12 MED ORDER — WHITE PETROLATUM EX OINT
TOPICAL_OINTMENT | CUTANEOUS | Status: AC
  Filled 2022-01-12: qty 5

## 2022-01-12 NOTE — [facility] Group Notes (Signed)
Pt attended AA group 

## 2022-01-12 NOTE — Group Note (Signed)
[facility] LCSW Group Therapy Note   Group Date: 01/12/2022 Start Time: 1300 End Time: 1400   Type of Therapy/Topic:  Group Therapy:  Emotion Regulation  Participation Level:  Minimal   Mood: Euthymic   Description of Group:    The purpose of this group is to assist patients in learning to regulate negative emotions and experience positive emotions. Patients will be guided to discuss ways in which they have been vulnerable to their negative emotions. These vulnerabilities will be juxtaposed with experiences of positive emotions or situations, and patients challenged to use positive emotions to combat negative ones. Special emphasis will be placed on coping with negative emotions in conflict situations, and patients will process healthy conflict resolution skills.  Therapeutic Goals: Patient will identify two positive emotions or experiences to reflect on in order to balance out negative emotions:  Patient will label two or more emotions that they find the most difficult to experience:  Patient will be able to demonstrate positive conflict resolution skills through discussion or role plays:   Summary of Patient Progress:   Patient was present for the entirety of group session. Patient participated in opening and closing remarks. However, patient did not contribute at all to the topic of discussion despite encouraged participation.     Therapeutic Modalities:   Cognitive Behavioral Therapy Feelings Identification Dialectical Behavioral Therapy   Alyze Lauf W Ebubechukwu Jedlicka, LCSWA 

## 2022-01-12 NOTE — Group Note (Signed)
Occupational Therapy Group Note  Group Topic:Communication  Group Date: 01/12/2022 Start Time: 1400 End Time: 1500 Facilitators: Ted Mcalpine, OT   Group Description: Group encouraged increased engagement and participation through discussion focused on communication styles. Patients were educated on the different styles of communication including passive, aggressive, assertive, and passive-aggressive communication. Group members shared and reflected on which styles they most often find themselves communicating in and brainstormed strategies on how to transition and practice a more assertive approach. Further discussion explored how to use assertiveness skills and strategies to further advocate and ask questions as it relates to their treatment plan and mental health.   Therapeutic Goal(s): Identify practical strategies to improve communication skills  Identify how to use assertive communication skills to address individual needs and wants   Participation Level: Active and Engaged   Participation Quality: Independent   Behavior: Appropriate and Cooperative   Speech/Thought Process: Directed, Organized, and Relevant   Affect/Mood: Appropriate   Insight: Good   Judgement: Good   Individualization: Pt was active / engaged in their participation of group discussion/activity. New skills were identified  Modes of Intervention: Discussion and Education  Patient Response to Interventions:  Attentive, Engaged, and Interested    Plan: Continue to engage patient in OT groups 2 - 3x/week.  01/12/2022  Ted Mcalpine, OT  Kerrin Champagne, OT

## 2022-01-12 NOTE — Group Note (Signed)
Recreation Therapy Group Note   Group Topic:Stress Management  Group Date: 01/12/2022 Start Time: 0940 End Time: 0953 Facilitators: Caroll Rancher, LRT,CTRS Location: [address] Dayroom   Goal Area(s) Addresses:  Patient will actively participate in stress management techniques presented during session.  Patient will successfully identify benefit of practicing stress management post d/c.   Group Description: Guided Imagery. LRT provided education, instruction, and demonstration on practice of visualization via guided imagery. Patient was asked to participate in the technique introduced during session. LRT debriefed including topics of mindfulness, stress management and specific scenarios each patient could use these techniques. Patients were given suggestions of ways to access scripts post d/c and encouraged to explore Youtube and other apps available on smartphones, tablets, and computers.   Affect/Mood: Appropriate   Participation Level: Engaged   Participation Quality: Independent   Behavior: Appropriate   Speech/Thought Process: Focused   Insight: Good   Judgement: Good   Modes of Intervention: Script, Soft Music   Patient Response to Interventions:  Engaged   Education Outcome:  Acknowledges education and In group clarification offered    Clinical Observations/Individualized Feedback: Pt attended and participated in group session.     Plan: Continue to engage patient in RT group sessions 2-3x/week.   Caroll Rancher, LRT,CTRS 01/12/2022 11:15 AM

## 2022-01-12 NOTE — [facility] Suicide Risk Assessment (Signed)
[facility] INPATIENT:  Family/Significant Other Suicide Prevention Education  Suicide Prevention Education:  Education Completed; Reita May, [phone], has been identified by the patient as the family member/significant other with whom the patient will be residing, and identified as the person(s) who will aid the patient in the event of a mental health crisis (suicidal ideations/suicide attempt).  With written consent from the patient, the family member/significant other has been provided the following suicide prevention education, prior to the and/or following the discharge of the patient.  Per conversation with fiancee, he has noticed improvement in patient's mood/demenor since being admitted to [facility]. Discharge plan consists of patient returning home to the supervision of fiancee who will monitor medication, treatment compliance, and provide support.   The suicide prevention education provided includes the following: Suicide risk factors Suicide prevention and interventions National Suicide Hotline telephone number [facility] assessment telephone number [facility] Emergency Assistance 911 [facility] and/or Residential Mobile Crisis Unit telephone number  Request made of family/significant other to: Remove weapons (e.g., guns, rifles, knives), all items previously/currently identified as safety concern.   Remove drugs/medications (over-the-counter, prescriptions, illicit drugs), all items previously/currently identified as a safety concern.  The family member/significant other verbalizes understanding of the suicide prevention education information provided.  The family member/significant other agrees to remove the items of safety concern listed above.  Corky Crafts 01/12/2022, 12:18 PM

## 2022-01-12 NOTE — Progress Notes (Signed)
D:  Patient's self inventory sheet, patient has fair sleep, sleep medication  helpful.  Good appetite, normal energy level, good concentration.  Rated depression and hopeless 5, anxiety 6.  Denied withdrawals.  Denied SI.  Denied physical problems.  Denied physical pain.  Goal is depression and anxiety coping.  Denied discharge plan. A:  Medications administered per MD orders.  Emotional support and encouragement given patient. R:  Denied SI and HI, contracts for safety.  Denied A/V hallucinations.  Safety maintained with 15 minute checks.

## 2022-01-12 NOTE — Progress Notes (Signed)
Adult Psychoeducational Group Note  Date:  01/12/2022 Time:  12:52 PM  Group Topic/Focus:  Goals Group:   The focus of this group is to help patients establish daily goals to achieve during treatment and discuss how the patient can incorporate goal setting into their daily lives to aide in recovery.  Participation Level:  Active  Participation Quality:  Appropriate  Affect:  Appropriate  Cognitive:  Appropriate  Insight: Appropriate  Engagement in Group:  Engaged  Modes of Intervention:  Discussion  Additional Comments:  Patient attended morning orientation/goal's group and said her goal for today is to discuss her discharged plan.   Karla Vines W Hagop Mccollam 01/12/2022, 12:52 PM

## 2022-01-12 NOTE — Progress Notes (Signed)
EKG completed and given to NP. UA placed in lab refrigerator for pick up.

## 2022-01-12 NOTE — Progress Notes (Signed)
[facility]  01/12/2022 3:10 PM Marie LevinsOceana Turlington  MRN:  [id number]  Subjective:  "My sleep last night was not good, and I feel like my anxiety medicine needs to be increased."  Reason For Admission: As per assessment [facility], "Marie LevinsOceana Bossi is a 31 y.o. female who presents the emergency department for suicidal ideation.  Patient has a history of major depressive disorder and suicide attempt by intentional overdose.  She currently is feeling severely suicidal over the past several days.  She has had worsening depression for the past month.  Patient states that she has been calling out of work.  She states she cannot go to sleep and that she drinks heavily.  She drinks heavily every night to go to sleep.  She does not feel dependent on alcohol.  She uses marijuana.  She states that she cannot stop crying.  She has not been eating or drinking or taking care of her hygiene.  Patient has a plan of suicide to overdose intentionally."    Today's patient assessment Note: Pt's chart reviewed, her case discussed with her treatment team. Pt is AAO x 4, she presents with a flat affect and depressed mood, attention to personal hygiene and grooming is fair, eye contact is good, speech is clear & coherent. Thought contents are organized and logical, and pt currently denies SI/HI/AVH or paranoia. There is no evidence of delusional thoughts.    Pt reports a poor sleep quality last night, reports a fair appetite, rates her depression 7 (10 being worst), and rates her anxiety 8.5 (10 being worst). As per nursing flow sheets, pt slept a total of 8.25 hrs last night. She denies being in any physical pain today, and asks for an increase in her Gabapentin and Zoloft. Will increase Zoloft to 150 mg daily for MDD, and Gabapentin to 300 mg TID for anxiety/ETOH use d/o.[name].  Principal Problem: MDD (major depressive disorder), recurrent episode, severe (HCC) Diagnosis: Principal Problem:   MDD (major  depressive disorder), recurrent episode, severe (HCC) Active Problems:   Alcohol use disorder, severe, dependence (HCC)  Total Time spent with patient: 20 minutes  Past Psychiatric History: As  above  Past Medical History:  Past Medical History:  Diagnosis Date   Anxiety    Depression    Insomnia    Marijuana use 01/08/2021   Moderate alcohol use disorder (HCC) 01/08/2021    Past Surgical History:  Procedure Laterality Date   CHOLECYSTECTOMY     GASTRIC BYPASS     GASTRIC BYPASS     LAPAROSCOPY N/A 08/11/2019   Procedure: LAPAROSCOPY DIAGNOSTIC;  Surgeon: Harriette Bouillonornett, Thomas, MD;  Location: [facility] OR;  Service: General;  Laterality: N/A;   Family History:  Family History  Problem Relation Age of Onset   Diabetes Mother    Diabetes Father    Family Psychiatric  History: none reported Social History:  Social History   Substance and Sexual Activity  Alcohol Use Yes     Social History   Substance and Sexual Activity  Drug Use Yes   Types: Marijuana    Social History   Socioeconomic History   Marital status: Single    Spouse name: Not on file   Number of children: Not on file   Years of education: Not on file   Highest education level: Not on file  Occupational History   Not on file  Tobacco Use   Smoking status: Never   Smokeless tobacco: Never  Vaping  Use   Vaping Use: Every day   Substances: Nicotine, Flavoring  Substance and Sexual Activity   Alcohol use: Yes   Drug use: Yes    Types: Marijuana   Sexual activity: Not on file  Other Topics Concern   Not on file  Social History Narrative   Not on file   Social Determinants of Health   Financial Resource Strain: Not on file  Food Insecurity: Not on file  Transportation Needs: Not on file  Physical Activity: Not on file  Stress: Not on file  Social Connections: Not on file   Additional Social History:    Sleep: Good  Appetite:  Fair  Current Medications: Current Facility-Administered  Medications  Medication Dose Route Frequency Provider Last Rate Last Admin   folic acid (FOLVITE) tablet 1 mg  1 mg Oral Daily Nwoko, Agnes I, NP   1 mg at 01/12/22 0759   gabapentin (NEURONTIN) capsule 300 mg  300 mg Oral TID Starleen Blue, NP       hydrOXYzine (ATARAX) tablet 50 mg  50 mg Oral Q6H PRN Armandina Stammer I, NP   50 mg at 01/12/22 0800   lisinopril (ZESTRIL) tablet 5 mg  5 mg Oral Daily Nwoko, Nicole Kindred I, NP   5 mg at 01/12/22 0759   LORazepam (ATIVAN) tablet 1 mg  1 mg Oral Q4H PRN Roselle Locus, MD       multivitamin with minerals tablet 1 tablet  1 tablet Oral Daily Armandina Stammer I, NP   1 tablet at 01/12/22 4098   nicotine (NICODERM CQ - dosed in mg/24 hours) patch 21 mg  21 mg Transdermal Daily Hill, Shelbie Hutching, MD   21 mg at 01/11/22 0800   OLANZapine zydis (ZYPREXA) disintegrating tablet 5 mg  5 mg Oral Q6H PRN Roselle Locus, MD   5 mg at 01/10/22 2044   Or   OLANZapine (ZYPREXA) injection 5 mg  5 mg Intramuscular Q6H PRN Roselle Locus, MD       OLANZapine (ZYPREXA) tablet 5 mg  5 mg Oral QHS Nwoko, Agnes I, NP   5 mg at 01/11/22 2121   ondansetron (ZOFRAN-ODT) disintegrating tablet 4 mg  4 mg Oral Q8H PRN Roselle Locus, MD   4 mg at 01/11/22 0855   [START ON 01/13/2022] sertraline (ZOLOFT) tablet 150 mg  150 mg Oral Daily Shayleigh Bouldin, NP       sucralfate (CARAFATE) 1 GM/10ML suspension 1 g  1 g Oral TID WC & HS Nwoko, Agnes I, NP   1 g at 01/12/22 1200   thiamine tablet 100 mg  100 mg Oral Daily Hill, Shelbie Hutching, MD   100 mg at 01/12/22 0754   traZODone (DESYREL) tablet 100 mg  100 mg Oral QHS Armandina Stammer I, NP   100 mg at 01/11/22 2121   vitamin B-12 (CYANOCOBALAMIN) tablet 1,000 mcg  1,000 mcg Oral Daily Armandina Stammer I, NP   1,000 mcg at 01/12/22 1191    Lab Results:  Results for orders placed or performed during the hospital encounter of 01/10/22 (from the past 48 hour(s))  Urinalysis, Complete w Microscopic Urine, Clean Catch      Status: Abnormal   Collection Time: 01/11/22 11:57 AM  Result Value Ref Range   Color, Urine AMBER (A) YELLOW    Comment: BIOCHEMICALS MAY BE AFFECTED BY COLOR   APPearance HAZY (A) CLEAR   Specific Gravity, Urine 1.015 1.005 - 1.030   pH 6.0 5.0 -  8.0   Glucose, UA NEGATIVE NEGATIVE mg/dL   Hgb urine dipstick NEGATIVE NEGATIVE   Bilirubin Urine NEGATIVE NEGATIVE   Ketones, ur 5 (A) NEGATIVE mg/dL   Protein, ur NEGATIVE NEGATIVE mg/dL   Nitrite POSITIVE (A) NEGATIVE   Leukocytes,Ua TRACE (A) NEGATIVE   RBC / HPF 0-5 0 - 5 RBC/hpf   WBC, UA 6-10 0 - 5 WBC/hpf   Bacteria, UA RARE (A) NONE SEEN   Squamous Epithelial / LPF 6-10 0 - 5   Mucus PRESENT     Comment: Performed at [facility], [address] [place], [place] [address]  Hemoglobin A1c     Status: Abnormal   Collection Time: 01/12/22  6:23 AM  Result Value Ref Range   Hgb A1c MFr Bld 4.5 (L) 4.8 - 5.6 %    Comment: (NOTE) Pre diabetes:          5.7%-6.4%  Diabetes:              >6.4%  Glycemic control for   <7.0% adults with diabetes    Mean Plasma Glucose 82.45 mg/dL    Comment: Performed at [facility], [address], [place], [place] [address]  TSH     Status: None   Collection Time: 01/12/22  6:23 AM  Result Value Ref Range   TSH 2.338 0.350 - 4.500 uIU/mL    Comment: Performed by a 3rd Generation assay with a functional sensitivity of <=0.01 uIU/mL. Performed at [facility], [address], [place], [place] [address]   Lipid panel     Status: None   Collection Time: 01/12/22  6:23 AM  Result Value Ref Range   Cholesterol 177 0 - 200 mg/dL   Triglycerides 42 <914 mg/dL   HDL 782 >95 mg/dL   Total CHOL/HDL Ratio 1.5 RATIO   VLDL 8 0 - 40 mg/dL   LDL Cholesterol 50 0 - 99 mg/dL    Comment:        Total Cholesterol/HDL:CHD Risk Coronary Heart Disease Risk Table                     Men   Women  1/2 Average Risk   3.4   3.3  Average Risk       5.0   4.4  2 X  Average Risk   9.6   7.1  3 X Average Risk  23.4   11.0        Use the calculated Patient Ratio above and the CHD Risk Table to determine the patient's CHD Risk.        ATP III CLASSIFICATION (LDL):  <100     mg/dL   Optimal  621-308  mg/dL   Near or Above                    Optimal  130-159  mg/dL   Borderline  657-846  mg/dL   High  >962     mg/dL   Very High Performed at [facility], [address], [place], [place] [address]    Blood Alcohol level:  Lab Results  Component Value Date   [facility] <10 01/09/2022   ETH <10 01/06/2021   Metabolic Disorder Labs: Lab Results  Component Value Date   HGBA1C 4.5 (L) 01/12/2022   MPG 82.45 01/12/2022   MPG 88.19 01/08/2021   No results found for: PROLACTIN Lab Results  Component Value Date  CHOL 177 01/12/2022   TRIG 42 01/12/2022   HDL 119 01/12/2022   CHOLHDL 1.5 01/12/2022   VLDL 8 01/12/2022   LDLCALC 50 01/12/2022   LDLCALC 38 01/08/2021   Physical Findings: AIMS:0 CIWA:  CIWA-Ar Total: 1 COWS:  n/a Musculoskeletal: Strength & Muscle Tone: within normal limits Gait & Station: normal Patient leans: N/A  Psychiatric Specialty Exam:  Presentation  General Appearance: Appropriate for Environment; Fairly Groomed  Eye Contact:Good  Speech:Clear and Coherent  Speech Volume:Normal  Handedness:Right  Mood and Affect  Mood:Depressed; Anxious  Affect:Congruent  Thought Process  Thought Processes:Coherent  Descriptions of Associations:Intact  Orientation:Full (Time, Place and Person)  Thought Content:Logical  History of Schizophrenia/Schizoaffective disorder:No  Duration of Psychotic Symptoms:No data recorded Hallucinations:Hallucinations: None  Ideas of Reference:None  Suicidal Thoughts:Suicidal Thoughts: No SI Passive Intent and/or Plan: Without Intent; Without Plan; Without Means to Carry Out; Without Access to Means  Homicidal Thoughts:Homicidal Thoughts: No  Sensorium   Memory:Immediate Good; Recent Good  Judgment:Fair  Insight:Fair  Executive Functions  Concentration:Fair  Attention Span:Fair  Recall:Good  Fund of Knowledge:Good  Language:Good  Psychomotor Activity  Psychomotor Activity:Psychomotor Activity: Normal  Assets  Assets:Communication Skills  Sleep  Sleep:Sleep: Fair  Physical Exam: Physical Exam Constitutional:      Appearance: Normal appearance.  HENT:     Head: Normocephalic.     Nose: Nose normal. No congestion or rhinorrhea.  Eyes:     Pupils: Pupils are equal, round, and reactive to light.  Pulmonary:     Effort: Pulmonary effort is normal.  Musculoskeletal:     Cervical back: Normal range of motion. No rigidity.  Neurological:     Mental Status: She is alert and oriented to person, place, and time.  Psychiatric:        Behavior: Behavior normal.        Thought Content: Thought content normal.   Review of Systems  Constitutional: Negative.  Negative for fever.  HENT: Negative.    Eyes: Negative.   Respiratory: Negative.    Cardiovascular: Negative.   Gastrointestinal: Negative.  Negative for heartburn.  Genitourinary: Negative.   Musculoskeletal: Negative.   Skin: Negative.   Neurological: Negative.   Psychiatric/Behavioral:  Positive for depression and substance abuse. Negative for hallucinations, memory loss and suicidal ideas. The patient is nervous/anxious and has insomnia.   Blood pressure (!) 124/94, pulse 84, temperature 97.7 F (36.5 C), resp. rate 16, height 5' 4.5" (1.638 m), weight 85.6 kg, SpO2 100 %. Body mass index is 31.91 kg/m.  Treatment Plan Summary: Daily contact with patient to assess and evaluate symptoms and progress in treatment and Medication management  Treatment Plan Summary: Daily contact with patient to assess and evaluate symptoms and progress in treatment and Medication management  Observation Level/Precautions:  15 minute checks  Laboratory:  Labs reviewed    Psychotherapy:  Unit Group sessions  Medications:  See [facility]  Consultations:  To be determined   Discharge Concerns:  Safety, medication compliance, mood stability  Estimated LOS: 5-7 days  Other:  N/A   PLAN Safety and Monitoring: Voluntary admission to inpatient psychiatric unit for safety, stabilization and treatment Daily contact with patient to assess and evaluate symptoms and progress in treatment Patient's case to be discussed in multi-disciplinary team meeting Observation Level : q15 minute checks Vital signs: q12 hours Precautions: suicide  Long Term Goal(s): Improvement in symptoms so as ready for discharge  Short Term Goals: Ability to identify changes in lifestyle to reduce recurrence of condition  will improve, Ability to verbalize feelings will improve, Ability to disclose and discuss suicidal ideas, Ability to identify and develop effective coping behaviors will improve, Compliance with prescribed medications will improve, and Ability to identify triggers associated with substance abuse/mental health issues will improve  Physician Treatment Plan for Secondary Diagnosis:  Principal Problem:   MDD (major depressive disorder), recurrent episode, severe (HCC) Active Problems:   Alcohol use disorder, severe, dependence (HCC)  Medications -Increase Sertraline to 150 mg po daily for depression.  -Continue Olanzapine 5 mg po daily for racing thoughts.  -Continue Lorazepam 1 mg po Q 4 hours prn for CIWA > 10.  - Continue Trazodone 100 mg po Q hs for insomnia.  - Increase gabapentin to 300 mg po tid for anxiety -Continue Agitation protocol PRN (see MAR) - Continue Lisinopril 5 mg po daily for HTN.  - Continue CARAFATE 1 gm po tid for reflux problems.  - Continue thiamine 100 mg po daily for thiamine replacement.  - Continue Nicotine patch 21 mg trans-dermally Q 24 hrs. - Continue Vitamin B-12 1,000 mg po daily for B-12 replacement. -Continue CIWAs & Monitor for signs of  withdrawal - Oral thiamine and MVI replacement as per MAR - Abstinence from substances encouraged  - SW to look into options for outpatient SA treatment at discharge  -Continue Hydroxyzine 50 mg every 6 hours PRN  Other PRNS -Continue Tylenol 650 mg every 6 hours PRN for mild pain -Continue Maalox 30 mg every 4 hrs PRN for indigestion -Continue Imodium 2-4 mg as needed for diarrhea -Continue Milk of Magnesia as needed every 6 hrs for constipation -Continue Zofran disintegrating tabs every 6 hrs PRN for nausea   Labs: Will repeat hepatic panel d/t elevated LFTs & will complete a hepatitis panel as well.  (AST-123 & ALT-119). Lipid panel WNL, Hemoglobin A1C-4.5, TSH WNL-2.338. Will repeat UA d/t trace leukocytes and rare bacteria in previous sample. Baseline EKG ordered. Discharge Planning: Social work and case management to assist with discharge planning and identification of hospital follow-up needs prior to discharge Estimated LOS: 5-7 days Discharge Concerns: Need to establish a safety plan; Medication compliance and effectiveness Discharge Goals: Return home with outpatient referrals for mental health follow-up including medication management/psychotherapy  I certify that inpatient services furnished can reasonably be expected to improve the patient's condition.    Starleen Blue, NP 5/22/20233:10 PM

## 2022-01-12 NOTE — Plan of Care (Signed)
Nurse discussed anxiety, depression and coping skills with patient.  

## 2022-01-12 NOTE — BH IP Treatment Plan (Signed)
Interdisciplinary Treatment and Diagnostic Plan Update  01/12/2022 Time of Session: 9:35am Marie Myers MRN: [id number]  Principal Diagnosis: MDD (major depressive disorder), recurrent episode, severe (HCC)  Secondary Diagnoses: Principal Problem:   MDD (major depressive disorder), recurrent episode, severe (HCC) Active Problems:   Alcohol use disorder, severe, dependence (HCC)   Current Medications:  Current Facility-Administered Medications  Medication Dose Route Frequency Provider Last Rate Last Admin   folic acid (FOLVITE) tablet 1 mg  1 mg Oral Daily Nwoko, Nicole Kindred I, NP   1 mg at 01/12/22 0759   gabapentin (NEURONTIN) capsule 200 mg  200 mg Oral TID Armandina Stammer I, NP   200 mg at 01/12/22 0754   hydrOXYzine (ATARAX) tablet 50 mg  50 mg Oral Q6H PRN Armandina Stammer I, NP   50 mg at 01/12/22 0800   lisinopril (ZESTRIL) tablet 5 mg  5 mg Oral Daily Armandina Stammer I, NP   5 mg at 01/12/22 0759   LORazepam (ATIVAN) tablet 1 mg  1 mg Oral Q4H PRN Roselle Locus, MD       multivitamin with minerals tablet 1 tablet  1 tablet Oral Daily Armandina Stammer I, NP   1 tablet at 01/12/22 3810   nicotine (NICODERM CQ - dosed in mg/24 hours) patch 21 mg  21 mg Transdermal Daily Hill, Shelbie Hutching, MD   21 mg at 01/11/22 0800   OLANZapine zydis (ZYPREXA) disintegrating tablet 5 mg  5 mg Oral Q6H PRN Roselle Locus, MD   5 mg at 01/10/22 2044   Or   OLANZapine (ZYPREXA) injection 5 mg  5 mg Intramuscular Q6H PRN Roselle Locus, MD       OLANZapine (ZYPREXA) tablet 5 mg  5 mg Oral QHS Nwoko, Nicole Kindred I, NP   5 mg at 01/11/22 2121   ondansetron (ZOFRAN-ODT) disintegrating tablet 4 mg  4 mg Oral Q8H PRN Roselle Locus, MD   4 mg at 01/11/22 0855   sertraline (ZOLOFT) tablet 100 mg  100 mg Oral Daily Leevy-Johnson, Brooke A, NP   100 mg at 01/12/22 0754   sucralfate (CARAFATE) 1 GM/10ML suspension 1 g  1 g Oral TID WC & HS Armandina Stammer I, NP   1 g at 01/12/22 0754   thiamine tablet 100  mg  100 mg Oral Daily Roselle Locus, MD   100 mg at 01/12/22 0754   traZODone (DESYREL) tablet 100 mg  100 mg Oral QHS Armandina Stammer I, NP   100 mg at 01/11/22 2121   vitamin B-12 (CYANOCOBALAMIN) tablet 1,000 mcg  1,000 mcg Oral Daily Armandina Stammer I, NP   1,000 mcg at 01/12/22 1751   PTA Medications: Medications Prior to Admission  Medication Sig Dispense Refill Last Dose   dicyclomine (BENTYL) 20 MG tablet Take 1 tablet (20 mg total) by mouth 2 (two) times daily at 8 am and 10 pm. (Patient not taking: Reported on 01/09/2022) 30 tablet 0    folic acid (FOLVITE) 1 MG tablet Take 1 tablet (1 mg total) by mouth daily. (Patient not taking: Reported on 01/09/2022) 30 tablet 0    gabapentin (NEURONTIN) 100 MG capsule Take 2 capsules (200 mg total) by mouth 3 (three) times daily. (Patient not taking: Reported on 01/09/2022) 180 capsule 0    hydrOXYzine (ATARAX/VISTARIL) 50 MG tablet Take 1 tablet (50 mg total) by mouth every 6 (six) hours as needed for anxiety (Anxiety). (Patient not taking: Reported on 01/09/2022) 30 tablet 0    lisinopril (  ZESTRIL) 5 MG tablet Take 1 tablet (5 mg total) by mouth daily. (Patient not taking: Reported on 01/09/2022) 30 tablet 0    metoCLOPramide (REGLAN) 5 MG tablet Take 1 tablet (5 mg total) by mouth every 8 (eight) hours as needed for nausea. (Patient not taking: Reported on 01/09/2022) 30 tablet 0    Multiple Vitamin (MULTIVITAMIN WITH MINERALS) TABS tablet Take 1 tablet by mouth daily. (Patient not taking: Reported on 01/09/2022)      OLANZapine (ZYPREXA) 5 MG tablet Take 1 tablet (5 mg total) by mouth at bedtime. (Patient not taking: Reported on 01/09/2022) 30 tablet 0    ondansetron (ZOFRAN ODT) 4 MG disintegrating tablet Take 1 tablet (4 mg total) by mouth every 8 (eight) hours as needed for nausea or vomiting. (Patient not taking: Reported on 01/09/2022) 10 tablet 0    sertraline (ZOLOFT) 100 MG tablet Take 1 tablet (100 mg total) by mouth daily. (Patient not taking:  Reported on 01/09/2022) 30 tablet 0    sucralfate (CARAFATE) 1 GM/10ML suspension Take 10 mLs (1 g total) by mouth 4 (four) times daily -  with meals and at bedtime. (Patient not taking: Reported on 01/09/2022) 420 mL 0    thiamine 100 MG tablet Take 1 tablet (100 mg total) by mouth daily. (Patient not taking: Reported on 01/09/2022)      vitamin B-12 1000 MCG tablet Take 1 tablet (1,000 mcg total) by mouth daily. (Patient not taking: Reported on 01/09/2022) 30 tablet 0     Patient Stressors: Financial difficulties   Medication change or noncompliance   Substance abuse    Patient Strengths: Ability for insight  Average or above average intelligence  Capable of independent living  Forensic psychologistCommunication skills  General fund of knowledge  Physical Health   Treatment Modalities: Medication Management, Group therapy, Case management,  1 to 1 session with clinician, Psychoeducation, Recreational therapy.   Physician Treatment Plan for Primary Diagnosis: MDD (major depressive disorder), recurrent episode, severe (HCC) Long Term Goal(s): Improvement in symptoms so as ready for discharge   Short Term Goals: Ability to demonstrate self-control will improve Ability to identify and develop effective coping behaviors will improve Compliance with prescribed medications will improve Ability to identify triggers associated with substance abuse/mental health issues will improve Ability to identify changes in lifestyle to reduce recurrence of condition will improve Ability to verbalize feelings will improve Ability to disclose and discuss suicidal ideas  Medication Management: Evaluate patient's response, side effects, and tolerance of medication regimen.  Therapeutic Interventions: 1 to 1 sessions, Unit Group sessions and Medication administration.  Evaluation of Outcomes: Progressing  Physician Treatment Plan for Secondary Diagnosis: Principal Problem:   MDD (major depressive disorder), recurrent episode,  severe (HCC) Active Problems:   Alcohol use disorder, severe, dependence (HCC)  Long Term Goal(s): Improvement in symptoms so as ready for discharge   Short Term Goals: Ability to demonstrate self-control will improve Ability to identify and develop effective coping behaviors will improve Compliance with prescribed medications will improve Ability to identify triggers associated with substance abuse/mental health issues will improve Ability to identify changes in lifestyle to reduce recurrence of condition will improve Ability to verbalize feelings will improve Ability to disclose and discuss suicidal ideas     Medication Management: Evaluate patient's response, side effects, and tolerance of medication regimen.  Therapeutic Interventions: 1 to 1 sessions, Unit Group sessions and Medication administration.  Evaluation of Outcomes: Progressing   RN Treatment Plan for Primary Diagnosis: MDD (major depressive disorder),  recurrent episode, severe (HCC) Long Term Goal(s): Knowledge of disease and therapeutic regimen to maintain health will improve  Short Term Goals: Ability to remain free from injury will improve, Ability to verbalize frustration and anger appropriately will improve, Ability to demonstrate self-control, Ability to participate in decision making will improve, Ability to verbalize feelings will improve, Ability to disclose and discuss suicidal ideas, Ability to identify and develop effective coping behaviors will improve, and Compliance with prescribed medications will improve  Medication Management: RN will administer medications as ordered by provider, will assess and evaluate patient's response and provide education to patient for prescribed medication. RN will report any adverse and/or side effects to prescribing provider.  Therapeutic Interventions: 1 on 1 counseling sessions, Psychoeducation, Medication administration, Evaluate responses to treatment, Monitor vital signs  and CBGs as ordered, Perform/monitor CIWA, COWS, AIMS and Fall Risk screenings as ordered, Perform wound care treatments as ordered.  Evaluation of Outcomes: Progressing   LCSW Treatment Plan for Primary Diagnosis: MDD (major depressive disorder), recurrent episode, severe (HCC) Long Term Goal(s): Safe transition to appropriate next level of care at discharge, Engage patient in therapeutic group addressing interpersonal concerns.  Short Term Goals: Engage patient in aftercare planning with referrals and resources, Increase social support, Increase ability to appropriately verbalize feelings, Increase emotional regulation, Facilitate acceptance of mental health diagnosis and concerns, Facilitate patient progression through stages of change regarding substance use diagnoses and concerns, Identify triggers associated with mental health/substance abuse issues, and Increase skills for wellness and recovery  Therapeutic Interventions: Assess for all discharge needs, 1 to 1 time with Social worker, Explore available resources and support systems, Assess for adequacy in community support network, Educate family and significant other(s) on suicide prevention, Complete Psychosocial Assessment, Interpersonal group therapy.  Evaluation of Outcomes: Progressing   Progress in Treatment: Attending groups: Yes. Participating in groups: Yes. Taking medication as prescribed: Yes. Toleration medication: Yes. Family/Significant other contact made: No, will contact:  Vickii Chafe, fiance Patient understands diagnosis: Yes. Discussing patient identified problems/goals with staff: Yes. Medical problems stabilized or resolved: Yes. Denies suicidal/homicidal ideation: Yes. Issues/concerns per patient self-inventory: No. Other: none  New problem(s) identified: No, Describe:  none reported   New Short Term/Long Term Goal(s): medication stabilization, elimination of SI thoughts, development of comprehensive  mental wellness plan.    Patient Goals:  Pt states, "I would like to get back on meds and get my mental health back in order."  Discharge Plan or Barriers: Patient recently admitted. CSW will continue to follow and assess for appropriate referrals and possible discharge planning.    Reason for Continuation of Hospitalization: Depression Medication stabilization Suicidal ideation  Estimated Length of Stay: 3-5 days  Last 3 [place] Suicide Severity Risk Score: Flowsheet Row Ad[facility] (Current) [facility] from [facility]09/2022 in [facility] from 01/19/2021 in [facility]  C-SSRS RISK CATEGORY Low Risk High Risk Error: Q3, 4, or 5 should not be populated when Q2 is No       Last PHQ 2/9 Scores:     View : No data to display.          Scribe for Treatment Team: Beatris Si, LCSW 01/12/2022 10:42 AM

## 2022-01-12 NOTE — [facility] Group Notes (Signed)
  Spiritual care group on grief and loss facilitated by chaplain Dyanne Carrel, [facility]   Group Goal:   Support / Education around grief and loss   Members engage in facilitated group support and psycho-social education.   Group Description:   Following introductions and group rules, group members engaged in facilitated group dialog and support around topic of loss, with particular support around experiences of loss in their lives. Group Identified types of loss (relationships / self / things) and identified patterns, circumstances, and changes that precipitate losses. Reflected on thoughts / feelings around loss, normalized grief responses, and recognized variety in grief experience. Group noted Worden's four tasks of grief in discussion.   Group drew on Adlerian / Rogerian, narrative, MI,   Patient Progress: Marie Myers attended group and actively participated and engaged in group conversation.  She shared that she had had 7 losses in recent years including the loss of a sister and a nephew that she mentioned specifically.  She asked good questions and her comments in group resonated with many of her peers and showed insight.  [address] [facility] Pager, [phone]

## 2022-01-13 MED ORDER — TRAZODONE HCL 100 MG PO TABS
100.0000 mg | ORAL_TABLET | Freq: Every day | ORAL | Status: DC
  Administered 2022-01-13: 100 mg via ORAL
  Filled 2022-01-13 (×3): qty 1

## 2022-01-13 MED ORDER — GUANFACINE HCL 1 MG PO TABS
1.0000 mg | ORAL_TABLET | Freq: Every day | ORAL | Status: DC
  Administered 2022-01-13: 1 mg via ORAL
  Filled 2022-01-13 (×4): qty 1

## 2022-01-13 MED ORDER — OLANZAPINE 5 MG PO TABS
5.0000 mg | ORAL_TABLET | Freq: Every day | ORAL | Status: DC
  Administered 2022-01-13: 5 mg via ORAL
  Filled 2022-01-13 (×3): qty 1

## 2022-01-13 NOTE — Progress Notes (Signed)
Patient stated she wants to talk to MD today about her medications.  Needs medications to "slow down her mind".  Medicines for "short term memory loss".  Does she have bipolar since she was diagnosed with manic depression?  Patient needs to talk to MD today.

## 2022-01-13 NOTE — Progress Notes (Signed)
The patient rated her day as a 10 out of 10 since she is feeling more like herself. Her energy level is up and anticipates being discharged on Thursday.

## 2022-01-13 NOTE — Group Note (Signed)
Recreation Therapy Group Note   Group Topic:Animal Assisted Therapy   Group Date: 01/13/2022 Start Time: 1430 End Time: 1510 Facilitators: Victorino Sparrow, LRT,CTRS Location: [address] Dayroom   Animal-Assisted Activity (AAA) Program Checklist/Progress Note Patient Eligibility Criteria Checklist & Daily Group note for Rec Tx Intervention  AAA/T Program Assumption of Risk Form signed by Patient/ or Parent Legal Guardian YES  Patient is free of allergies or severe asthma  YES  Patient reports no fear of animals YES  Patient reports no history of cruelty to animals YES  Patient understands their participation is voluntary YES  Patient washes hands before animal contact YES  Patient washes hands after animal contact YES   Group Description: Patients provided opportunity to interact with trained and credentialed [facility] [employer] Therapy dog and the community volunteer/dog handler. Patients practiced appropriate animal interaction and were educated on dog safety outside of the hospital in common community settings. Patients were allowed to use dog toys and other items to practice commands, engage the dog in play, and/or complete routine aspects of animal care.   Education: Contractor, Pensions consultant, Communication & Social Skills    Affect/Mood: Appropriate   Participation Level: Engaged    Clinical Observations/Individualized Feedback:  Pt attended and participated in group session.  Pt engaged with therapy dog team in an appropriate manner.    Plan: Continue to engage patient in RT group sessions 2-3x/week.   Victorino Sparrow, Glennis Brink  01/13/2022 3:37 PM

## 2022-01-13 NOTE — Plan of Care (Signed)
Nurse discussed anxiety, depression and coping skills with patient.  

## 2022-01-13 NOTE — Progress Notes (Signed)
   01/12/22 2200  Psych Admission Type (Psych Patients Only)  Admission Status Involuntary  Psychosocial Assessment  Patient Complaints None  Eye Contact Fair  Facial Expression Flat  Affect Appropriate to circumstance;Apprehensive  Speech Logical/coherent  Interaction Assertive  Motor Activity Slow  Appearance/Hygiene Improved  Behavior Characteristics Appropriate to situation;Cooperative  Mood Pleasant  Thought Process  Coherency WDL  Content WDL  Delusions None reported or observed  Perception WDL  Hallucination None reported or observed  Judgment Poor  Confusion None  Danger to Self  Current suicidal ideation? Denies  Self-Injurious Behavior No self-injurious ideation or behavior indicators observed or expressed   Agreement Not to Harm Self Yes  Description of Agreement verbal  Danger to Others  Danger to Others None reported or observed

## 2022-01-13 NOTE — Progress Notes (Deleted)
The patient rated her day as a 10 out of 10 since she anticipates being discharged tomorrow. In addition, she went outside for fresh air and went over her discharge plans.

## 2022-01-13 NOTE — [facility] Group Notes (Signed)
Adult Orientation Group Note  Date:  01/13/2022 Time:  10:49 AM  Group Topic/Focus:  Orientation:   The focus of this group is to educate the patient on the purpose and policies of crisis stabilization and provide a format to answer questions about their admission.  The group details unit policies and expectations of patients while admitted.  Participation Level:  Active  Participation Quality:  Appropriate  Affect:  Appropriate  Cognitive:  Appropriate  Insight: Appropriate  Engagement in Group:  Engaged  Modes of Intervention:  Education   Kern Reap 01/13/2022, 10:49 AM

## 2022-01-13 NOTE — Progress Notes (Signed)
[facility] MD Progress Note  01/13/2022 2:23 PM Marie Myers  MRN:  [id number]  Subjective:  "Last night , I had issues going to sleep because they gave me medications at around 945, but they didn't kick in until like 11. My depression is going away, my mind is not racing. I am getting better."  Reason For Admission: As per assessment completed at the [facility] ED, "Marie Myers is a 31 y.o. female who presents the emergency department for suicidal ideation.  Patient has a history of major depressive disorder and suicide attempt by intentional overdose.  She currently is feeling severely suicidal over the past several days.  She has had worsening depression for the past month.  Patient states that she has been calling out of work.  She states she cannot go to sleep and that she drinks heavily.  She drinks heavily every night to go to sleep.  She does not feel dependent on alcohol.  She uses marijuana.  She states that she cannot stop crying.  She has not been eating or drinking or taking care of her hygiene.  Patient has a plan of suicide to overdose intentionally."    Today's patient assessment Note: Pt's chart reviewed, her case discussed with her treatment team. Pt is AAO x 4, she presents with a euthymic mood, affect is congruent and appropriate. Pt's attention to personal hygiene and grooming is fair, eye contact is good, speech is clear & coherent. Thought contents are organized and logical, and pt currently denies SI/HI/AVH or paranoia. There is no evidence of delusional thoughts.    Pt reports that last night, she had trouble going to sleep because her medications were given at about 9.45 pm and didn't take effect until 11 pm. She prefers for her medications to be given earlier so that they can take effect early. Night time meds will be moved to 20.00, and will continue meds as listed below. She reports that she is tolerating the increase in doses of the Gabapentin to 300 mg TID and Zoloft to 150 mg  daily, and denies any current medication related side effects.  Principal Problem: MDD (major depressive disorder), recurrent episode, severe (HCC) Diagnosis: Principal Problem:   MDD (major depressive disorder), recurrent episode, severe (HCC) Active Problems:   Alcohol use disorder, severe, dependence (HCC)  Total Time spent with patient: 20 minutes  Past Psychiatric History: As  above  Past Medical History:  Past Medical History:  Diagnosis Date   Anxiety    Depression    Insomnia    Marijuana use 01/08/2021   Moderate alcohol use disorder (HCC) 01/08/2021    Past Surgical History:  Procedure Laterality Date   CHOLECYSTECTOMY     GASTRIC BYPASS     GASTRIC BYPASS     LAPAROSCOPY N/A 08/11/2019   Procedure: LAPAROSCOPY DIAGNOSTIC;  Surgeon: Harriette Bouillon, MD;  Location: [facility] OR;  Service: General;  Laterality: N/A;   Family History:  Family History  Problem Relation Age of Onset   Diabetes Mother    Diabetes Father    Family Psychiatric  History: none reported Social History:  Social History   Substance and Sexual Activity  Alcohol Use Yes     Social History   Substance and Sexual Activity  Drug Use Yes   Types: Marijuana    Social History   Socioeconomic History   Marital status: Single    Spouse name: Not on file   Number of children: Not on file   Years of  education: Not on file   Highest education level: Not on file  Occupational History   Not on file  Tobacco Use   Smoking status: Never   Smokeless tobacco: Never  Vaping Use   Vaping Use: Every day   Substances: Nicotine, Flavoring  Substance and Sexual Activity   Alcohol use: Yes   Drug use: Yes    Types: Marijuana   Sexual activity: Not on file  Other Topics Concern   Not on file  Social History Narrative   Not on file   Social Determinants of Health   Financial Resource Strain: Not on file  Food Insecurity: Not on file  Transportation Needs: Not on file  Physical Activity: Not  on file  Stress: Not on file  Social Connections: Not on file   Additional Social History:    Sleep: Good  Appetite:  Fair  Current Medications: Current Facility-Administered Medications  Medication Dose Route Frequency Provider Last Rate Last Admin   folic acid (FOLVITE) tablet 1 mg  1 mg Oral Daily Nwoko, Agnes I, NP   1 mg at 01/13/22 1610   gabapentin (NEURONTIN) capsule 300 mg  300 mg Oral TID Starleen Blue, NP   300 mg at 01/13/22 1256   hydrOXYzine (ATARAX) tablet 50 mg  50 mg Oral Q6H PRN Armandina Stammer I, NP   50 mg at 01/13/22 1337   lisinopril (ZESTRIL) tablet 5 mg  5 mg Oral Daily Nwoko, Nicole Kindred I, NP   5 mg at 01/13/22 9604   LORazepam (ATIVAN) tablet 1 mg  1 mg Oral Q4H PRN Roselle Locus, MD       multivitamin with minerals tablet 1 tablet  1 tablet Oral Daily Armandina Stammer I, NP   1 tablet at 01/13/22 5409   nicotine (NICODERM CQ - dosed in mg/24 hours) patch 21 mg  21 mg Transdermal Daily Hill, Shelbie Hutching, MD   21 mg at 01/11/22 0800   OLANZapine zydis (ZYPREXA) disintegrating tablet 5 mg  5 mg Oral Q6H PRN Roselle Locus, MD   5 mg at 01/10/22 2044   Or   OLANZapine (ZYPREXA) injection 5 mg  5 mg Intramuscular Q6H PRN Roselle Locus, MD       OLANZapine (ZYPREXA) tablet 5 mg  5 mg Oral QHS Janina Trafton, NP       ondansetron (ZOFRAN-ODT) disintegrating tablet 4 mg  4 mg Oral Q8H PRN Roselle Locus, MD   4 mg at 01/11/22 0855   sertraline (ZOLOFT) tablet 150 mg  150 mg Oral Daily Starleen Blue, NP   150 mg at 01/13/22 0721   sucralfate (CARAFATE) 1 GM/10ML suspension 1 g  1 g Oral TID WC & HS Nwoko, Agnes I, NP   1 g at 01/13/22 1257   thiamine tablet 100 mg  100 mg Oral Daily Hill, Shelbie Hutching, MD   100 mg at 01/13/22 8119   traZODone (DESYREL) tablet 100 mg  100 mg Oral QHS Mayling Aber, NP       vitamin B-12 (CYANOCOBALAMIN) tablet 1,000 mcg  1,000 mcg Oral Daily Armandina Stammer I, NP   1,000 mcg at 01/13/22 1478    Lab Results:   Results for orders placed or performed during the hospital encounter of 01/10/22 (from the past 48 hour(s))  Hemoglobin A1c     Status: Abnormal   Collection Time: 01/12/22  6:23 AM  Result Value Ref Range   Hgb A1c MFr Bld 4.5 (L) 4.8 - 5.6 %  Comment: (NOTE) Pre diabetes:          5.7%-6.4%  Diabetes:              >6.4%  Glycemic control for   <7.0% adults with diabetes    Mean Plasma Glucose 82.45 mg/dL    Comment: Performed at [facility], [address] [place], [place] [address]  TSH     Status: None   Collection Time: 01/12/22  6:23 AM  Result Value Ref Range   TSH 2.338 0.350 - 4.500 uIU/mL    Comment: Performed by a 3rd Generation assay with a functional sensitivity of <=0.01 uIU/mL. Performed at [facility], [address], [place], [place] [address]   Lipid panel     Status: None   Collection Time: 01/12/22  6:23 AM  Result Value Ref Range   Cholesterol 177 0 - 200 mg/dL   Triglycerides 42 <419 mg/dL   HDL 379 >02 mg/dL   Total CHOL/HDL Ratio 1.5 RATIO   VLDL 8 0 - 40 mg/dL   LDL Cholesterol 50 0 - 99 mg/dL    Comment:        Total Cholesterol/HDL:CHD Risk Coronary Heart Disease Risk Table                     Men   Women  1/2 Average Risk   3.4   3.3  Average Risk       5.0   4.4  2 X Average Risk   9.6   7.1  3 X Average Risk  23.4   11.0        Use the calculated Patient Ratio above and the CHD Risk Table to determine the patient's CHD Risk.        ATP III CLASSIFICATION (LDL):  <100     mg/dL   Optimal  409-735  mg/dL   Near or Above                    Optimal  130-159  mg/dL   Borderline  329-924  mg/dL   High  >268     mg/dL   Very High Performed at [facility], [address], [place], [place] [address]   Urinalysis, Complete w Microscopic Urine, Clean Catch     Status: Abnormal   Collection Time: 01/12/22  5:47 PM  Result Value Ref Range   Color, Urine AMBER (A) YELLOW    Comment:  BIOCHEMICALS MAY BE AFFECTED BY COLOR   APPearance HAZY (A) CLEAR   Specific Gravity, Urine 1.018 1.005 - 1.030   pH 5.0 5.0 - 8.0   Glucose, UA NEGATIVE NEGATIVE mg/dL   Hgb urine dipstick NEGATIVE NEGATIVE   Bilirubin Urine NEGATIVE NEGATIVE   Ketones, ur 5 (A) NEGATIVE mg/dL   Protein, ur NEGATIVE NEGATIVE mg/dL   Nitrite NEGATIVE NEGATIVE   Leukocytes,Ua NEGATIVE NEGATIVE   RBC / HPF 0-5 0 - 5 RBC/hpf   WBC, UA 0-5 0 - 5 WBC/hpf   Bacteria, UA MANY (A) NONE SEEN   Squamous Epithelial / LPF 6-10 0 - 5   Mucus PRESENT     Comment: Performed at [facility], [address] [place], [place] [address]  Hepatic function panel     Status: Abnormal   Collection Time: 01/12/22  6:14 PM  Result Value Ref Range   Total Protein 7.3 6.5 - 8.1 g/dL   Albumin 4.2 3.5 -  5.0 g/dL   AST 161148 (H) 15 - 41 U/L   ALT 113 (H) 0 - 44 U/L   Alkaline Phosphatase 77 38 - 126 U/L   Total Bilirubin 0.8 0.3 - 1.2 mg/dL   Bilirubin, Direct 0.2 0.0 - 0.2 mg/dL   Indirect Bilirubin 0.6 0.3 - 0.9 mg/dL    Comment: Performed at [facility], [address] [place], [place] [address]  Hepatitis panel, acute     Status: None   Collection Time: 01/12/22  6:14 PM  Result Value Ref Range   Hepatitis B Surface Ag NON REACTIVE NON REACTIVE   HCV Ab NON REACTIVE NON REACTIVE    Comment: (NOTE) Nonreactive HCV antibody screen is consistent with no HCV infections,  unless recent infection is suspected or other evidence exists to indicate HCV infection.     Hep A IgM NON REACTIVE NON REACTIVE   Hep B C IgM NON REACTIVE NON REACTIVE    Comment: Performed at [facility], [address] [place], [place] [address]   Blood Alcohol level:  Lab Results  Component Value Date   ETH <10 01/09/2022   ETH <10 01/06/2021   Metabolic Disorder Labs: Lab Results  Component Value Date   HGBA1C 4.5 (L) 01/12/2022   MPG 82.45 01/12/2022   MPG 88.19 01/08/2021   No results  found for: PROLACTIN Lab Results  Component Value Date   CHOL 177 01/12/2022   TRIG 42 01/12/2022   HDL 119 01/12/2022   CHOLHDL 1.5 01/12/2022   VLDL 8 01/12/2022   LDLCALC 50 01/12/2022   LDLCALC 38 01/08/2021   Physical Findings: AIMS:0 CIWA:  CIWA-Ar Total: 1 COWS:  n/a Musculoskeletal: Strength & Muscle Tone: within normal limits Gait & Station: normal Patient leans: N/A  Psychiatric Specialty Exam:  Presentation  General Appearance: Appropriate for Environment; Fairly Groomed  Eye Contact:Fair  Speech:Clear and Coherent  Speech Volume:Normal  Handedness:Right  Mood and Affect  Mood:Euthymic  Affect:Congruent  Thought Process  Thought Processes:Coherent  Descriptions of Associations:Intact  Orientation:Full (Time, Place and Person)  Thought Content:Logical  History of Schizophrenia/Schizoaffective disorder:No  Duration of Psychotic Symptoms:No data recorded Hallucinations:Hallucinations: None  Ideas of Reference:None  Suicidal Thoughts:Suicidal Thoughts: No  Homicidal Thoughts:Homicidal Thoughts: No  Sensorium  Memory:Immediate Good; Recent Good; Remote Good  Judgment:Fair  Insight:Fair  Executive Functions  Concentration:Good  Attention Span:Good  Recall:Good  Fund of Knowledge:Good  Language:Good  Psychomotor Activity  Psychomotor Activity:Psychomotor Activity: Normal  Assets  Assets:Communication Skills; Desire for Improvement; Housing; Social Support  Sleep  Sleep:Sleep: Good  Physical Exam: Physical Exam Constitutional:      Appearance: Normal appearance.  HENT:     Head: Normocephalic.     Nose: Nose normal. No congestion or rhinorrhea.  Eyes:     Pupils: Pupils are equal, round, and reactive to light.  Pulmonary:     Effort: Pulmonary effort is normal.  Musculoskeletal:        General: Normal range of motion.     Cervical back: Normal range of motion. No rigidity.  Neurological:     Mental Status: She is  alert and oriented to person, place, and time.  Psychiatric:        Behavior: Behavior normal.        Thought Content: Thought content normal.   Review of Systems  Constitutional: Negative.  Negative for fever.  HENT: Negative.    Eyes: Negative.   Respiratory: Negative.    Cardiovascular: Negative.  Gastrointestinal: Negative.  Negative for heartburn.  Genitourinary: Negative.   Musculoskeletal: Negative.   Skin: Negative.   Neurological: Negative.   Psychiatric/Behavioral:  Positive for depression (improving on current meds) and substance abuse. Negative for hallucinations, memory loss and suicidal ideas. The patient is nervous/anxious (improving on current meds) and has insomnia (improving on current meds).   Blood pressure (!) 137/99, pulse 81, temperature 97.8 F (36.6 C), temperature source Oral, resp. rate 16, height 5' 4.5" (1.638 m), weight 85.6 kg, SpO2 100 %. Body mass index is 31.91 kg/m.  Treatment Plan Summary: Daily contact with patient to assess and evaluate symptoms and progress in treatment and Medication management  Treatment Plan Summary: Daily contact with patient to assess and evaluate symptoms and progress in treatment and Medication management  Observation Level/Precautions:  15 minute checks  Laboratory:  Labs reviewed   Psychotherapy:  Unit Group sessions  Medications:  See [facility]  Consultations:  To be determined   Discharge Concerns:  Safety, medication compliance, mood stability  Estimated LOS: 5-7 days  Other:  N/A   PLAN Safety and Monitoring: Voluntary admission to inpatient psychiatric unit for safety, stabilization and treatment Daily contact with patient to assess and evaluate symptoms and progress in treatment Patient's case to be discussed in multi-disciplinary team meeting Observation Level : q15 minute checks Vital signs: q12 hours Precautions: suicide  Long Term Goal(s): Improvement in symptoms so as ready for discharge  Short Term  Goals: Ability to identify changes in lifestyle to reduce recurrence of condition will improve, Ability to verbalize feelings will improve, Ability to disclose and discuss suicidal ideas, Ability to identify and develop effective coping behaviors will improve, Compliance with prescribed medications will improve, and Ability to identify triggers associated with substance abuse/mental health issues will improve  Physician Treatment Plan for Secondary Diagnosis:  Principal Problem:   MDD (major depressive disorder), recurrent episode, severe (HCC) Active Problems:   Alcohol use disorder, severe, dependence (HCC)  Medications -Continue Sertraline to 150 mg po daily for depression.  -Continue Olanzapine 5 mg po daily for racing thoughts.  -Continue Lorazepam 1 mg po Q 4 hours prn for CIWA > 10.  - Continue Trazodone 100 mg po Q hs for insomnia.  - Continue Gabapentin to 300 mg po tid for anxiety -Continue Agitation protocol PRN (see MAR) - Continue Lisinopril 5 mg po daily for HTN.  - Continue CARAFATE 1 gm po tid for reflux problems.  - Continue thiamine 100 mg po daily for thiamine replacement.  - Continue Nicotine patch 21 mg trans-dermally Q 24 hrs. - Continue Vitamin B-12 1,000 mg po daily for B-12 replacement. -Continue CIWAs & Monitor for signs of withdrawal - Oral thiamine and MVI replacement as per MAR - Abstinence from substances encouraged  - SW to look into options for outpatient SA treatment at discharge  -Continue Hydroxyzine 50 mg every 6 hours PRN  Other PRNS -Continue Tylenol 650 mg every 6 hours PRN for mild pain -Continue Maalox 30 mg every 4 hrs PRN for indigestion -Continue Imodium 2-4 mg as needed for diarrhea -Continue Milk of Magnesia as needed every 6 hrs for constipation -Continue Zofran disintegrating tabs every 6 hrs PRN for nausea   Labs: LFTs elevated on admission  (AST-123 & ALT-119). Repeated and AST is more elevated at 148, ALT-113. Pt will need PCP f/u  after discharge. Hepatitis panel WNL. Hemoglobin A1C-4.5, TSH WNL-2.338. UA repeated d/t trace leukocytes and rare bacteria in previous sample. Repeat UA with many bacteria  and no leukocytes. Pt is asymptomatic & not treated at this time. Baseline EKG WNL, with QTC of 434.  Discharge Planning: Social work and case management to assist with discharge planning and identification of hospital follow-up needs prior to discharge Estimated LOS: 5-7 days Discharge Concerns: Need to establish a safety plan; Medication compliance and effectiveness Discharge Goals: Return home with outpatient referrals for mental health follow-up including medication management/psychotherapy  I certify that inpatient services furnished can reasonably be expected to improve the patient's condition.    Starleen Blue, NP 5/23/20232:23 PM   Patient ID: Marie Myers, female   DOB: [birth year], 31 y.o.   MRN: [id number]

## 2022-01-13 NOTE — Progress Notes (Signed)
D:  Patient's self inventory sheet, patient has fair sleep, sleep medication helpful.  Good appetite, high energy level, good concentration.  Rated depression 5, hopeless 2, anxiety 7  Denied withdrawals.  Denied SI.  Denied physical problems.  Denied physical pain.  Goal is work on discharge and depression.  Plans to focus, attend groups.  Plans to make sure she discharges home with sleeping, anxiety and depression meds.  Does have discharge plans. A:  Medications administered per MD orders.  Emotional support and encouragement given patient. R: Denied SI and HI, contracts for safety.  Denied A/V hallucinations.  Safety maintained with 15 minute checks.

## 2022-01-14 DIAGNOSIS — F332 Major depressive disorder, recurrent severe without psychotic features: Principal | ICD-10-CM

## 2022-01-14 MED ORDER — GUANFACINE HCL 1 MG PO TABS: 1.0000 mg | ORAL_TABLET | Freq: Every day | ORAL | 0 refills | Status: DC

## 2022-01-14 MED ORDER — LISINOPRIL 5 MG PO TABS: 5.0000 mg | ORAL_TABLET | Freq: Every day | ORAL | 0 refills | Status: DC

## 2022-01-14 MED ORDER — SERTRALINE HCL 50 MG PO TABS: 150.0000 mg | ORAL_TABLET | Freq: Every day | ORAL | 0 refills | Status: DC

## 2022-01-14 MED ORDER — NICOTINE 21 MG/24HR TD PT24: 21.0000 mg | MEDICATED_PATCH | Freq: Every day | TRANSDERMAL | 0 refills | Status: AC

## 2022-01-14 MED ORDER — TRAZODONE HCL 100 MG PO TABS
100.0000 mg | ORAL_TABLET | Freq: Every day | ORAL | 0 refills | Status: DC
Start: 2022-01-14 — End: 2022-02-20

## 2022-01-14 MED ORDER — OLANZAPINE 5 MG PO TABS: 5.0000 mg | ORAL_TABLET | Freq: Every day | ORAL | 0 refills | Status: DC

## 2022-01-14 MED ORDER — SERTRALINE HCL 50 MG PO TABS
150.0000 mg | ORAL_TABLET | Freq: Every day | ORAL | Status: DC
  Filled 2022-01-14: qty 21

## 2022-01-14 MED ORDER — HYDROXYZINE HCL 50 MG PO TABS: 50.0000 mg | ORAL_TABLET | Freq: Four times a day (QID) | ORAL | 0 refills | Status: AC | PRN

## 2022-01-14 MED ORDER — GABAPENTIN 300 MG PO CAPS
300.0000 mg | ORAL_CAPSULE | Freq: Three times a day (TID) | ORAL | 0 refills | Status: DC
Start: 2022-01-14 — End: 2022-02-20

## 2022-01-14 NOTE — Discharge Instructions (Signed)
-  Follow-up with your outpatient psychiatric provider -instructions on appointment date, time, and address (location) are provided to you in discharge paperwork.  -Take your psychiatric medications as prescribed at discharge - instructions are provided to you in the discharge paperwork  -Follow-up with outpatient primary care doctor and other specialists -for management of chronic medical disease.  -Recommend abstinence from alcohol, tobacco, and other illicit drug use at discharge.   -If your psychiatric symptoms recur, worsen, or if you have side effects to your psychiatric medications, call your outpatient psychiatric provider, 911, [phone] or go to the nearest emergency department.  -If suicidal thoughts recur, call your outpatient psychiatric provider, 911, [phone] or go to the nearest emergency department.  

## 2022-01-14 NOTE — Group Note (Signed)
Recreation Therapy Group Note   Group Topic:Team Building  Group Date: 01/14/2022 Start Time: 0930 End Time: 1000 Facilitators: Caroll Rancher, LRT,CTRS Location: [address] Dayroom   Goal Area(s) Addresses:  Patient will effectively work with peer towards shared goal.  Patient will identify skills used to make activity successful.  Patient will identify how skills used during activity can be applied to reach post d/c goals.    Group Description: [employer]. In teams of 5-6, patients were given 25 small craft pipe cleaners. Using the materials provided, patients were instructed to compete again the opposing team(s) to build the tallest free-standing structure from floor level. The activity was timed; difficulty increased by Clinical research associate as Production designer, theatre/television/film continued.  Systematically resources were removed with additional directions for example, placing one arm behind their back, working in silence, and shape stipulations. LRT facilitated post-activity discussion reviewing team processes and necessary communication skills involved in completion. Patients were encouraged to reflect how the skills utilized, or not utilized, in this activity can be incorporated to positively impact support systems post discharge.   Affect/Mood: Appropriate   Participation Level: Engaged   Participation Quality: Independent   Behavior: Appropriate   Speech/Thought Process: Focused   Insight: Good   Judgement: Good   Modes of Intervention: Team-building   Patient Response to Interventions:  Engaged   Education Outcome:  Acknowledges education and In group clarification offered    Clinical Observations/Individualized Feedback: Pt appeared to be one of the leaders in the group.  Pt expressed ideas for the group but also listened to the ideas of peers.  Pt was appropriate and engaged throughout group session.    Plan: Continue to engage patient in RT group sessions  2-3x/week.   Caroll Rancher, LRT,CTRS 01/14/2022 11:21 AM

## 2022-01-14 NOTE — Progress Notes (Signed)
   01/13/22 2300  Psych Admission Type (Psych Patients Only)  Admission Status Involuntary  Psychosocial Assessment  Patient Complaints None  Eye Contact Fair  Facial Expression Fixed smile  Affect Appropriate to circumstance  Speech Logical/coherent  Interaction Attention-seeking  Motor Activity Fidgety  Appearance/Hygiene Improved  Behavior Characteristics Appropriate to situation;Cooperative  Mood Pleasant  Thought Process  Coherency WDL  Content WDL  Delusions None reported or observed  Perception WDL  Hallucination None reported or observed  Judgment Poor  Confusion None  Danger to Self  Current suicidal ideation? Denies  Self-Injurious Behavior No self-injurious ideation or behavior indicators observed or expressed   Agreement Not to Harm Self Yes  Description of Agreement verbal  Danger to Others  Danger to Others None reported or observed

## 2022-01-14 NOTE — [facility] Group Notes (Signed)
Adult Psychoeducational Group Note  Date:  01/14/2022 Time:  11:06 AM  Group Topic/Focus:  Healthy Communication:   The focus of this group is to discuss communication, barriers to communication, as well as healthy ways to communicate with others.  Participation Level:  Active  Participation Quality:  Appropriate  Affect:  Appropriate  Cognitive:  Appropriate  Insight: Appropriate  Engagement in Group:  Engaged  Modes of Intervention:  Discussion  Additional Comments:    Dub Mikes 01/14/2022, 11:06 AM

## 2022-01-14 NOTE — [facility] Suicide Risk Assessment (Signed)
Suicide Risk Assessment  Discharge Assessment    [facility] Discharge Suicide Risk Assessment   Principal Problem: MDD (major depressive disorder), recurrent episode, severe (HCC) Discharge Diagnoses: Principal Problem:   MDD (major depressive disorder), recurrent episode, severe (HCC) Active Problems:   Alcohol use disorder, severe, dependence (HCC)  Reason For Admission: As per assessment completed at the [facility] ED, "Marie Myers is a 31 y.o. female who presents the emergency department for suicidal ideation.  Patient has a history of major depressive disorder and suicide attempt by intentional overdose.  She currently is feeling severely suicidal over the past several days.  She has had worsening depression for the past month.  Patient states that she has been calling out of work.  She states she cannot go to sleep and that she drinks heavily.  She drinks heavily every night to go to sleep.  She does not feel dependent on alcohol.  She uses marijuana.  She states that she cannot stop crying.  She has not been eating or drinking or taking care of her hygiene.  Patient has a plan of suicide to overdose intentionally."                               HOSPITAL COURSE During the patient's hospitalization, patient had extensive initial psychiatric evaluation, and follow-up psychiatric evaluations every day.  Diagnoses provided & medications started upon initial assessment were as follows:  - Sertraline 100 mg po daily for depression.  - Olanzapine 5 mg po daily for racing thoughts.  - Hydroxyzine 50 mg po Q 6 hrs prn for anxiety. - Lorazepam 1 mg po Q 4 hours prn for CIWA > 10.  -  Trazodone 100 mg po Q hs for insomnia.  - gabapentin 200 mg po tid for substance withdrawal syndrome/anxiety.   - Lisinopril 5 mg po daily for HTN.  -  CARAFATE 1 gm po tid for reflux problems.  - thiamine 100 mg po daily for thiamine replacement.  - Nicotine patch 21 mg trans-dermally Q 24 hrs. -Vitamin B-12 1,000 mg  po daily for B-12 replacement.   During the hospitalization, other adjustments were made to the patient's psychiatric medication regimen, with medications at discharge being as follows:  -Continue Sertraline to 150 mg po daily for depression.  -Continue Olanzapine 5 mg po daily for racing thoughts.  - Continue Trazodone 100 mg po Q hs for insomnia.  - Continue Gabapentin to 300 mg po tid for anxiety - Continue Lisinopril 5 mg po daily for HTN.  - Continue Nicotine patch 21 mg trans-dermally Q 24 hrs. -Continue Hydroxyzine 50 mg every 6 hours PRN -Continue  Guanfacine 1 mg nightly for racing thoughts   Patient's care was discussed during the interdisciplinary team meeting every day during the hospitalization. The patient denies having side effects to prescribed psychiatric medication. The patient was evaluated each day by a clinical provider to ascertain response to treatment. Improvement was noted by the patient's report of decreasing symptoms, improved sleep and appetite, affect, medication tolerance, behavior, and participation in unit programming.  Patient was asked each day to complete a self inventory noting mood, mental status, pain, new symptoms, anxiety and concerns.    Symptoms were reported as significantly decreased or resolved completely by discharge. On day of discharge, the patient reports that their mood is stable. The patient denied having suicidal thoughts for more than 48 hours prior to discharge.  Patient denies having  homicidal thoughts.  Patient denies having auditory hallucinations.  Patient denies any visual hallucinations or other symptoms of psychosis. The patient was motivated to continue taking medication with a goal of continued improvement in mental health.   The patient reports that their target psychiatric symptoms of depression & anxiety responded well to the psychiatric medications, and the patient reports overall benefit from this psychiatric hospitalization.  Supportive psychotherapy was provided to the patient. The patient also participated in regular group therapy while hospitalized. Coping skills, problem solving as well as relaxation therapies were also part of the unit programming.  Labs were reviewed with the patient, and abnormal results were discussed with the patient. LFTs are elevated during this hospitalization with AST at 123 & ALT at 119. Labs were repeated and showed AST at 148 and ALT at 113. Pt has been educated to follow this up with her primary care provider and verbalizes understanding. Urinalysis also showed many bacteria in the specimen collected. Pt denies any symptoms of a urinary tract infection and treatment is currently not necessary. Pt has been educated to follow up with her PCP in the event that she starts having symptoms of a UTI.   Total Time spent with patient: 30 minutes  Musculoskeletal: Strength & Muscle Tone: within normal limits Gait & Station: normal Patient leans: N/A  Psychiatric Specialty Exam  Presentation  General Appearance: Appropriate for Environment; Well Groomed  Eye Contact:Good  Speech:Clear and Coherent  Speech Volume:Normal  Handedness:Right  Mood and Affect  Mood:Euthymic  Duration of Depression Symptoms: Greater than two weeks  Affect:Appropriate  Thought Process  Thought Processes:Coherent  Descriptions of Associations:Intact  Orientation:Full (Time, Place and Person)  Thought Content:Logical  History of Schizophrenia/Schizoaffective disorder:No  Duration of Psychotic Symptoms:No data recorded Hallucinations:Hallucinations: None  Ideas of Reference:None  Suicidal Thoughts:Suicidal Thoughts: No  Homicidal Thoughts:Homicidal Thoughts: No  Sensorium  Memory:Immediate Good; Recent Good; Remote Good  Judgment:Good  Insight:Good  Executive Functions  Concentration:Good  Attention Span:Good  Recall:Good  Fund of Knowledge:Good  Language:Good  Psychomotor  Activity  Psychomotor Activity:Psychomotor Activity: Normal  Assets  Assets:Communication Skills  Sleep  Sleep:Sleep: Good  Physical Exam: Physical Exam Constitutional:      Appearance: Normal appearance.  HENT:     Head: Normocephalic.     Nose: Nose normal. No congestion or rhinorrhea.  Eyes:     Pupils: Pupils are equal, round, and reactive to light.  Pulmonary:     Effort: Pulmonary effort is normal. No respiratory distress.  Musculoskeletal:        General: Normal range of motion.     Cervical back: Normal range of motion.  Neurological:     Mental Status: She is alert and oriented to person, place, and time.     Sensory: No sensory deficit.     Coordination: Coordination normal.  Psychiatric:        Behavior: Behavior normal.        Thought Content: Thought content normal.   Review of Systems  Constitutional: Negative.   HENT: Negative.    Eyes: Negative.   Respiratory: Negative.    Cardiovascular: Negative.   Gastrointestinal: Negative.   Genitourinary: Negative.   Musculoskeletal: Negative.   Skin: Negative.   Neurological: Negative.   Psychiatric/Behavioral:  Positive for depression (Pt reports that her depressive symptoms are resolving. She denies SI/HI/AVH and verbally contracts for safety of [facility]) and substance abuse. Negative for hallucinations, memory loss and suicidal ideas. The patient is nervous/anxious (Anxiety is resolving with  current medications). Insomnia: insomnia is resolving with current medications, and pt reports a good sleep quality last night.  Blood pressure 123/88, pulse 75, temperature (!) 97.5 F (36.4 C), temperature source Oral, resp. rate 16, height 5' 4.5" (1.638 m), weight 85.6 kg, SpO2 99 %. Body mass index is 31.91 kg/m.  Mental Status Per Nursing Assessment::   On Admission:  Suicidal ideation indicated by patient  Demographic Factors:  Low socioeconomic status  Loss Factors: NA  Historical  Factors: Impulsivity  Risk Reduction Factors:   Responsible for children under 69 years of age, Living with another person, especially a relative, and Positive social support  Continued Clinical Symptoms:  Patient reports that her depressive symptoms are resolving. She denies SI/HI/AVH, and verbally contracts for safety outside of this [facility].  Cognitive Features That Contribute To Risk:  None    Suicide Risk:  Minimal: No identifiable suicidal ideation.  Patients presenting with no risk factors but with morbid ruminations; may be classified as minimal risk based on the severity of the depressive symptoms   Follow-up Information     [facility]. Go on 01/19/2022.   Specialty: Behavioral Health Why: You have an appointment to obtain substance use therapy and medication management services on 01/19/22 at 7:30 am.  Services are provided on a first come, first served basis. Contact information: [address] [place] [address] [phone]               Plan Of Care/Follow-up recommendations:  The patient is able to verbalize their individual safety plan to this provider.  # It is recommended to the patient to continue psychiatric medications as prescribed, after discharge from the hospital.    # It is recommended to the patient to follow up with your outpatient psychiatric provider and PCP.  # It was discussed with the patient, the impact of alcohol, drugs, tobacco have been there overall psychiatric and medical wellbeing, and total abstinence from substance use was recommended the patient.ed.  # Prescriptions provided or sent directly to preferred pharmacy at discharge. Patient agreeable to plan. Given opportunity to ask questions. Appears to feel comfortable with discharge.    # In the event of worsening symptoms, the patient is instructed to call the crisis hotline (988), 911 and or go to the nearest ED for appropriate evaluation and  treatment of symptoms. To follow-up with primary care provider for other medical issues, concerns and or health care needs.  # Patient was discharged home with a plan to follow up as noted above.   Starleen Blue, NP 01/14/2022, 12:01 PM

## 2022-01-14 NOTE — Progress Notes (Signed)
  [facility] Adult Case Management Discharge Plan :  Will you be returning to the same living situation after discharge:  Yes,  Patient to discharge to place of residence with fiancee and patients child.  At discharge, do you have transportation home?: Yes,  Patient's fiancee to assist with transportation from hospital.  Do you have the ability to pay for your medications: Yes,  [place] Medicaid  Patient to be provided with 7 day supply of medications at discharge.    Release of information consent forms completed and in the chart;  Patient's signature needed at discharge.  Patient to Follow up at:  Follow-up Information     [facility]. Go on 01/19/2022.   Specialty: Behavioral Health Why: You have an appointment to obtain substance use therapy and medication management services on 01/19/22 at 7:30 am.  Services are provided on a first come, first served basis. Contact information: [address] [place] [address] [phone]                Next level of care provider has access to [facility] Link:no  Safety Planning and Suicide Prevention discussed: Yes,  SPE completed with patient and Reita May.    Has patient been referred to the Quitline?: N/A patient is not a smoker Tobacco Use: Low Risk    Smoking Tobacco Use: Never   Smokeless Tobacco Use: Never   Passive Exposure: Not on file    Patient has been referred for addiction treatment: Yes Patient endorses active cannabis use, UDS not on file. Patient referred to [facility] for mental health and addiction counseling.   Corky Crafts, LCSWA 01/14/2022, 11:32 AM

## 2022-01-14 NOTE — [facility] Group Notes (Signed)
The focus of this group is to help patients establish daily goals to achieve during treatment and discuss how the patient can incorporate goal setting into their daily lives to aide in recovery.  Pt. Stated that her goal Is," Go talk to the doctor about going home."

## 2022-01-14 NOTE — Discharge Summary (Signed)
Physician Discharge Summary Note  Patient:  Marie Myers is an 31 y.o., female MRN:  [id number] DOB:  1990/12/01 Patient phone:  [phone] (home)  Patient address:   [place] [address],   Total Time spent with patient: 30 minutes  Date of Admission:  01/10/2022 Date of Discharge: 01/14/2022  Reason for Admission:  As per assessment completed at the [facility] ED, "Marie Myers is a 31 y.o. female who presents the emergency department for suicidal ideation.  Patient has a history of major depressive disorder and suicide attempt by intentional overdose.  She currently is feeling severely suicidal over the past several days.  She has had worsening depression for the past month.  Patient states that she has been calling out of work.  She states she cannot go to sleep and that she drinks heavily.  She drinks heavily every night to go to sleep.  She does not feel dependent on alcohol.  She uses marijuana.  She states that she cannot stop crying.  She has not been eating or drinking or taking care of her hygiene.  Patient has a plan of suicide to overdose intentionally."                                    HOSPITAL COURSE During the patient's hospitalization, patient had extensive initial psychiatric evaluation, and follow-up psychiatric evaluations every day.  Diagnoses provided & medications started upon initial assessment were as follows:   - Sertraline 100 mg po daily for depression.  - Olanzapine 5 mg po daily for racing thoughts.  - Hydroxyzine 50 mg po Q 6 hrs prn for anxiety. - Lorazepam 1 mg po Q 4 hours prn for CIWA > 10.  -  Trazodone 100 mg po Q hs for insomnia.  - gabapentin 200 mg po tid for substance withdrawal syndrome/anxiety.   - Lisinopril 5 mg po daily for HTN.  -  CARAFATE 1 gm po tid for reflux problems.  - thiamine 100 mg po daily for thiamine replacement.  - Nicotine patch 21 mg trans-dermally Q 24 hrs. -Vitamin B-12 1,000 mg po daily for B-12  replacement.   During the hospitalization, other adjustments were made to the patient's psychiatric medication regimen, with medications at discharge being as follows:   -Continue Sertraline to 150 mg po daily for depression.  -Continue Olanzapine 5 mg po daily for racing thoughts.  - Continue Trazodone 100 mg po Q hs for insomnia.  - Continue Gabapentin to 300 mg po tid for anxiety - Continue Lisinopril 5 mg po daily for HTN.  - Continue Nicotine patch 21 mg trans-dermally Q 24 hrs. -Continue Hydroxyzine 50 mg every 6 hours PRN -Continue  Guanfacine 1 mg nightly for racing thoughts   Patient's care was discussed during the interdisciplinary team meeting every day during the hospitalization. The patient denies having side effects to prescribed psychiatric medication. The patient was evaluated each day by a clinical provider to ascertain response to treatment. Improvement was noted by the patient's report of decreasing symptoms, improved sleep and appetite, affect, medication tolerance, behavior, and participation in unit programming.  Patient was asked each day to complete a self inventory noting mood, mental status, pain, new symptoms, anxiety and concerns.     Symptoms were reported as significantly decreased or resolved completely by discharge. On day of discharge, the patient reports that their mood is stable. The patient denied  having suicidal thoughts for more than 48 hours prior to discharge.  Patient denies having homicidal thoughts.  Patient denies having auditory hallucinations.  Patient denies any visual hallucinations or other symptoms of psychosis. The patient was motivated to continue taking medication with a goal of continued improvement in mental health.    The patient reports that their target psychiatric symptoms of depression & anxiety responded well to the psychiatric medications, and the patient reports overall benefit from this psychiatric hospitalization. Supportive  psychotherapy was provided to the patient. The patient also participated in regular group therapy while hospitalized. Coping skills, problem solving as well as relaxation therapies were also part of the unit programming.   Labs were reviewed with the patient, and abnormal results were discussed with the patient. LFTs are elevated during this hospitalization with AST at 123 & ALT at 119. Labs were repeated and showed AST at 148 and ALT at 113. Pt has been educated to follow this up with her primary care provider and verbalizes understanding. Urinalysis also showed many bacteria in the specimen collected. Pt denies any symptoms of a urinary tract infection and treatment is currently not necessary. Pt has been educated to follow up with her PCP in the event that she starts having symptoms of a UTI.   Principal Problem: MDD (major depressive disorder), recurrent episode, severe ([place]) Discharge Diagnoses: Principal Problem:   MDD (major depressive disorder), recurrent episode, severe ([place]) Active Problems:   Alcohol use disorder, severe, dependence ([place])  Past Psychiatric History: As above  Past Medical History:  Past Medical History:  Diagnosis Date   Anxiety    Depression    Insomnia    Marijuana use 01/08/2021   Moderate alcohol use disorder ([place]) 01/08/2021    Past Surgical History:  Procedure Laterality Date   CHOLECYSTECTOMY     GASTRIC BYPASS     GASTRIC BYPASS     LAPAROSCOPY N/A 08/11/2019   Procedure: LAPAROSCOPY DIAGNOSTIC;  Surgeon: Erroll Luna, MD;  Location: [facility];  Service: General;  Laterality: N/A;   Family History:  Family History  Problem Relation Age of Onset   Diabetes Mother    Diabetes Father    Family Psychiatric  History: None reported Social History:  Social History   Substance and Sexual Activity  Alcohol Use Yes     Social History   Substance and Sexual Activity  Drug Use Yes   Types: Marijuana    Social History   Socioeconomic History    Marital status: Single    Spouse name: Not on file   Number of children: Not on file   Years of education: Not on file   Highest education level: Not on file  Occupational History   Not on file  Tobacco Use   Smoking status: Never   Smokeless tobacco: Never  Vaping Use   Vaping Use: Every day   Substances: Nicotine, Flavoring  Substance and Sexual Activity   Alcohol use: Yes   Drug use: Yes    Types: Marijuana   Sexual activity: Not on file  Other Topics Concern   Not on file  Social History Narrative   Not on file   Social Determinants of Health   Financial Resource Strain: Not on file  Food Insecurity: Not on file  Transportation Needs: Not on file  Physical Activity: Not on file  Stress: Not on file  Social Connections: Not on file   Physical Findings: AIMS: Facial and Oral Movements Muscles of Facial Expression: None, normal  Lips and Perioral Area: None, normal Jaw: None, normal Tongue: None, normal,Extremity Movements Upper (arms, wrists, hands, fingers): None, normal Lower (legs, knees, ankles, toes): None, normal, Trunk Movements Neck, shoulders, hips: None, normal, Overall Severity Severity of abnormal movements (highest score from questions above): None, normal Incapacitation due to abnormal movements: None, normal Patient's awareness of abnormal movements (rate only patient's report): No Awareness, Dental Status Current problems with teeth and/or dentures?: No Does patient usually wear dentures?: No  CIWA:  CIWA-Ar Total: 1 COWS:   n/a  Musculoskeletal: Strength & Muscle Tone: within normal limits Gait & Station: normal Patient leans: N/A  Psychiatric Specialty Exam:  Presentation  General Appearance: Appropriate for Environment; Well Groomed  Eye Contact:Good  Speech:Clear and Coherent  Speech Volume:Normal  Handedness:Right  Mood and Affect  Mood:Euthymic  Affect:Appropriate  Thought Process  Thought  Processes:Coherent  Descriptions of Associations:Intact  Orientation:Full (Time, Place and Person)  Thought Content:Logical  History of Schizophrenia/Schizoaffective disorder:No  Duration of Psychotic Symptoms:No data recorded Hallucinations:Hallucinations: None  Ideas of Reference:None  Suicidal Thoughts:Suicidal Thoughts: No  Homicidal Thoughts:Homicidal Thoughts: No  Sensorium  Memory:Immediate Good; Recent Good; Remote Good  Judgment:Good  Insight:Good  Executive Functions  Concentration:Good  Attention Span:Good  [place] of Knowledge:Good  Language:Good  Psychomotor Activity  Psychomotor Activity:Psychomotor Activity: Normal  Assets  Assets:Communication Skills  Sleep  Sleep:Sleep: Good  Physical Exam: Physical Exam Constitutional:      Appearance: Normal appearance.  HENT:     Head: Normocephalic.     Nose: Nose normal. No congestion or rhinorrhea.  Eyes:     Pupils: Pupils are equal, round, and reactive to light.  Pulmonary:     Effort: Pulmonary effort is normal.  Musculoskeletal:        General: Normal range of motion.     Cervical back: Normal range of motion. No rigidity.  Neurological:     General: No focal deficit present.     Mental Status: She is alert and oriented to person, place, and time.     Sensory: No sensory deficit.     Coordination: Coordination normal.  Psychiatric:        Behavior: Behavior normal.   Review of Systems  Constitutional: Negative.  Negative for fever.  HENT: Negative.  Negative for sore throat.   Eyes: Negative.   Respiratory: Negative.  Negative for cough.   Cardiovascular: Negative.  Negative for chest pain.  Gastrointestinal: Negative.   Genitourinary: Negative.   Musculoskeletal: Negative.   Skin: Negative.   Neurological: Negative.   Psychiatric/Behavioral:  Positive for depression and substance abuse. Negative for hallucinations, memory loss and suicidal ideas. Nervous/anxious:  anxiety is  improving with meds. Insomnia: insomnia is improving on current medications.   Blood pressure 123/88, pulse 75, temperature (!) 97.5 F (36.4 C), temperature source Oral, resp. rate 16, height 5' 4.5" (1.638 m), weight 85.6 kg, SpO2 99 %. Body mass index is 31.91 kg/m.  Social History   Tobacco Use  Smoking Status Never  Smokeless Tobacco Never   Tobacco Cessation:  A prescription for an FDA-approved tobacco cessation medication provided at discharge  Blood Alcohol level:  Lab Results  Component Value Date   [facility] <10 01/09/2022   ETH <10 123XX123   Metabolic Disorder Labs:  Lab Results  Component Value Date   HGBA1C 4.5 (L) 01/12/2022   MPG 82.45 01/12/2022   MPG 88.19 01/08/2021   No results found for: PROLACTIN Lab Results  Component Value Date   CHOL 177  01/12/2022   TRIG 42 01/12/2022   HDL 119 01/12/2022   CHOLHDL 1.5 01/12/2022   VLDL 8 01/12/2022   LDLCALC 50 01/12/2022   LDLCALC 38 01/08/2021   See Psychiatric Specialty Exam and Suicide Risk Assessment completed by Attending Physician prior to discharge.  Discharge destination:  Home  Is patient on multiple antipsychotic therapies at discharge:  No   Has Patient had three or more failed trials of antipsychotic monotherapy by history:  No  Recommended Plan for Multiple Antipsychotic Therapies: NA  Discharge Instructions     Diet - low sodium heart healthy   Complete by: As directed    Increase activity slowly   Complete by: As directed       Allergies as of 01/14/2022       Reactions   Zithromax [azithromycin] Hives        Medication List     STOP taking these medications    dicyclomine 20 MG tablet Commonly known as: BENTYL   metoCLOPramide 5 MG tablet Commonly known as: Reglan       TAKE these medications      Indication  cyanocobalamin 1000 MCG tablet Take 1 tablet (1,000 mcg total) by mouth daily.  Indication: Inadequate Vitamin 123456   folic acid 1 MG  tablet Commonly known as: FOLVITE Take 1 tablet (1 mg total) by mouth daily.  Indication: Anemia From Inadequate Folic Acid   gabapentin 300 MG capsule Commonly known as: NEURONTIN Take 1 capsule (300 mg total) by mouth 3 (three) times daily. What changed:  medication strength how much to take  Indication: Abuse or Misuse of Alcohol, ETOH abuse/Anxiety   guanFACINE 1 MG tablet Commonly known as: TENEX Take 1 tablet (1 mg total) by mouth at bedtime.  Indication: adhd   hydrOXYzine 50 MG tablet Commonly known as: ATARAX Take 1 tablet (50 mg total) by mouth every 6 (six) hours as needed for anxiety (Anxiety).  Indication: Feeling Anxious   lisinopril 5 MG tablet Commonly known as: ZESTRIL Take 1 tablet (5 mg total) by mouth daily.  Indication: High Blood Pressure Disorder   multivitamin with minerals Tabs tablet Take 1 tablet by mouth daily.  Indication: Nutritional Support   nicotine 21 mg/24hr patch Commonly known as: NICODERM CQ - dosed in mg/24 hours Place 1 patch (21 mg total) onto the skin daily for 28 days. Start taking on: Jan 15, 2022  Indication: Nicotine Addiction   OLANZapine 5 MG tablet Commonly known as: ZYPREXA Take 1 tablet (5 mg total) by mouth at bedtime.  Indication: Major Depressive Disorder   ondansetron 4 MG disintegrating tablet Commonly known as: Zofran ODT Take 1 tablet (4 mg total) by mouth every 8 (eight) hours as needed for nausea or vomiting.  Indication: Nausea and Vomiting   sertraline 50 MG tablet Commonly known as: ZOLOFT Take 3 tablets (150 mg total) by mouth daily. Start taking on: Jan 15, 2022 What changed:  medication strength how much to take  Indication: Major Depressive Disorder   sucralfate 1 GM/10ML suspension Commonly known as: Carafate Take 10 mLs (1 g total) by mouth 4 (four) times daily -  with meals and at bedtime.  Indication: Gastroesophageal Reflux Disease   thiamine 100 MG tablet Take 1 tablet (100 mg  total) by mouth daily.  Indication: Deficiency of Vitamin B1   traZODone 100 MG tablet Commonly known as: DESYREL Take 1 tablet (100 mg total) by mouth at bedtime.  Indication: Trouble Sleeping, Major Depressive Disorder  [place]. Go on 01/19/2022.   Specialty: Behavioral Health Why: You have an appointment to obtain substance use therapy and medication management services on 01/19/22 at 7:30 am.  Services are provided on a first come, first served basis. Contact information: [place] [phone]               Follow-up recommendations:    The patient is able to verbalize their individual safety plan to this provider.   # It is recommended to the patient to continue psychiatric medications as prescribed, after discharge from the hospital.     # It is recommended to the patient to follow up with your outpatient psychiatric provider and PCP.   # It was discussed with the patient, the impact of alcohol, drugs, tobacco have been there overall psychiatric and medical wellbeing, and total abstinence from substance use was recommended the patient.ed.   # Prescriptions provided or sent directly to preferred pharmacy at discharge. Patient agreeable to plan. Given opportunity to ask questions. Appears to feel comfortable with discharge.    # In the event of worsening symptoms, the patient is instructed to call the crisis hotline (988), 911 and or go to the nearest ED for appropriate evaluation and treatment of symptoms. To follow-up with primary care provider for other medical issues, concerns and or health care needs.   # Patient was discharged home with a plan to follow up as noted above.   Signed: Nicholes Rough, NP 01/14/2022, 12:19 PM

## 2022-01-14 NOTE — Progress Notes (Signed)
Discharge note: RN met with pt and reviewed pt's discharge instructions. Pt verbalized understanding of discharge instructions and pt did not have any questions. RN reviewed and provided pt with a copy of SRA, AVS and Transition Record. RN returned pt's belongings to pt. Prescriptions and samples were given to pt. Pt denied SI/HI/AVH and voiced no concerns. Pt was appreciative of the care pt received at [facility]. Patient discharged to the lobby without incident.   01/14/22 0801  Psych Admission Type (Psych Patients Only)  Admission Status Involuntary  Psychosocial Assessment  Patient Complaints Anxiety  Eye Contact Fair  Facial Expression Fixed smile;Animated  Affect Appropriate to circumstance  Speech Logical/coherent;Loud  Interaction Attention-seeking;Needy  Motor Activity Hyperactive  Appearance/Hygiene Unremarkable  Behavior Characteristics Cooperative;Appropriate to situation  Mood Pleasant  Thought Process  Coherency WDL  Content WDL  Delusions None reported or observed  Perception WDL  Hallucination None reported or observed  Judgment Limited  Confusion None  Danger to Self  Current suicidal ideation? Denies  Danger to Others  Danger to Others None reported or observed

## 2022-02-20 ENCOUNTER — Encounter (HOSPITAL_COMMUNITY): Payer: Self-pay | Admitting: Psychiatry

## 2022-02-20 ENCOUNTER — Ambulatory Visit (INDEPENDENT_AMBULATORY_CARE_PROVIDER_SITE_OTHER): Payer: No Payment, Other | Admitting: Psychiatry

## 2022-02-20 DIAGNOSIS — F411 Generalized anxiety disorder: Secondary | ICD-10-CM | POA: Diagnosis not present

## 2022-02-20 DIAGNOSIS — F32A Depression, unspecified: Secondary | ICD-10-CM | POA: Diagnosis not present

## 2022-02-20 DIAGNOSIS — F1094 Alcohol use, unspecified with alcohol-induced mood disorder: Secondary | ICD-10-CM

## 2022-02-20 MED ORDER — GUANFACINE HCL 1 MG PO TABS: 1.0000 mg | ORAL_TABLET | Freq: Every day | ORAL | 3 refills | Status: DC

## 2022-02-20 MED ORDER — GABAPENTIN 400 MG PO CAPS: 400.0000 mg | ORAL_CAPSULE | Freq: Three times a day (TID) | ORAL | 3 refills | Status: DC

## 2022-02-20 MED ORDER — TRAZODONE HCL 100 MG PO TABS: 200.0000 mg | ORAL_TABLET | Freq: Every day | ORAL | 3 refills | Status: DC

## 2022-02-20 MED ORDER — OLANZAPINE 10 MG PO TABS: 10.0000 mg | ORAL_TABLET | Freq: Every day | ORAL | 3 refills | Status: DC

## 2022-02-20 MED ORDER — SERTRALINE HCL 50 MG PO TABS: 150.0000 mg | ORAL_TABLET | Freq: Every day | ORAL | 3 refills | Status: DC

## 2022-02-20 NOTE — Progress Notes (Signed)
Psychiatric Initial Adult Assessment  Virtual Visit via Video Note  I connected with Marie Myers on 02/20/22 at  9:00 AM EDT by a video enabled telemedicine application and verified that I am speaking with the correct person using two identifiers.  Location: Patient: Home Provider: Clinic   I discussed the limitations of evaluation and management by telemedicine and the availability of in person appointments. The patient expressed understanding and agreed to proceed.  I provided 45 minutes of non-face-to-face time during this encounter.   Patient Identification: Marie Myers MRN:  [id number] Date of Evaluation:  02/20/2022 Referral Source: [facility] Chief Complaint:  "The meds are good. I wish they could be better" Visit Diagnosis:    ICD-10-CM   1. Alcohol-induced mood disorder (HCC)  F10.94 traZODone (DESYREL) 100 MG tablet    sertraline (ZOLOFT) 50 MG tablet    gabapentin (NEURONTIN) 400 MG capsule    guanFACINE (TENEX) 1 MG tablet    OLANZapine (ZYPREXA) 10 MG tablet    2. Generalized anxiety disorder  F41.1 traZODone (DESYREL) 100 MG tablet    sertraline (ZOLOFT) 50 MG tablet    gabapentin (NEURONTIN) 400 MG capsule    OLANZapine (ZYPREXA) 10 MG tablet    3. Mild depression  F32.A traZODone (DESYREL) 100 MG tablet    sertraline (ZOLOFT) 50 MG tablet    OLANZapine (ZYPREXA) 10 MG tablet      History of Present Illness: 31 year old female seen today for initial psychiatric evaluation.  She was referred to outpatient psychiatry by [facility] where she was seen on 01/10/2022-01/15/2022 presented with worsening depression and SI.  She has a psychiatric history of alcohol dependence, marijuana use, and depression.  She is currently managed on Zyprexa 5 mg nightly, trazodone 100 mg nightly as needed, gabapentin 300 mg 3 times daily, Zoloft 150 mg daily, and guanfacine 1 mg daily.  She reports her medications are somewhat effective in managing her psychiatric conditions.  Today she was  well-groomed, pleasant, cooperative, engaged in conversation, and maintained eye contact.  She informed Clinical research associate that her medications are okay but notes that she wishes that they can be better.  She notes that she has fluctuations in mood, irritability, distractibility, racing thoughts, and impulsive spending.  She also informed Clinical research associate that she has poor sleep noting that she sleeps 5 hours or less.  Patient informed writer that she drinks alcohol to help manage her sleep.  Provider conducted an audit assessment and patient scored a 17.  Provider gave patient resources to [facility] as she notes that she is considering cutting back.   Patient also informed writer that her anxiety and depression continues to be bothersome.  Provider conducted a GAD-7 and patient scored a 15.  Provider also conducted PHQ-9 and patient scored a 15.  Today she denies SI/HI/VAH or paranoia.  Patient informed writer that at times she feels like she dissociates and steps outside of her body.  She notes that she feels like she is in a dream.  Patient informed Clinical research associate that she has had many deaths in her last which she reports was traumatic.  She notes that her sister and her sister's son passed away.  She also reports that recently she had a friend and a uncle passed 2 weeks ago.  Patient notes that she continues to grieve the loss of these loved ones.  Patient notes that recently she is quit her job to focus on her studies.  She notes that she is studying Scientist, physiological at [employer]  Today patient  is agreeable to increasing gabapentin 300 mg 3 times daily to 400 mg 3 times daily to help manage anxiety, mood, and sleep.  She is also agreeable to increasing trazodone 100 mg nightly as needed 100 mg nightly as needed (patient notes that she had already started doing this).  Zyprexa also increased from 5mg  to 10 mg to help manage mood.  Patient will continue all other medications as prescribed.  No other concerns noted at this time.  Associated  Signs/Symptoms: Depression Symptoms:  depressed mood, insomnia, psychomotor agitation, fatigue, feelings of worthlessness/guilt, difficulty concentrating, hopelessness, anxiety, (Hypo) Manic Symptoms:  Distractibility, Elevated Mood, Flight of Ideas, , Irritable Mood, Anxiety Symptoms:  Excessive Worry, Psychotic Symptoms:   Denies PTSD Symptoms: Had a traumatic exposure:  Notes that the death of her sister, nephew, friend (two weeks ago), friend (two days ago), and uncle (two weeks ago)  Past Psychiatric History: Marijuana use, alcohol use, depression  Previous Psychotropic Medications: No   Substance Abuse History in the last 12 months:  Yes.    Consequences of Substance Abuse: NA  Past Medical History:  Past Medical History:  Diagnosis Date   Anxiety    Depression    Insomnia    Marijuana use 01/08/2021   Moderate alcohol use disorder (HCC) 01/08/2021    Past Surgical History:  Procedure Laterality Date   CHOLECYSTECTOMY     GASTRIC BYPASS     GASTRIC BYPASS     LAPAROSCOPY N/A 08/11/2019   Procedure: LAPAROSCOPY DIAGNOSTIC;  Surgeon: 08/13/2019, MD;  Location: [facility] OR;  Service: General;  Laterality: N/A;    Family Psychiatric History: Sister depression, Father alcohol use  Family History:  Family History  Problem Relation Age of Onset   Diabetes Mother    Diabetes Father     Social History:   Social History   Socioeconomic History   Marital status: Single    Spouse name: Not on file   Number of children: Not on file   Years of education: Not on file   Highest education level: Not on file  Occupational History   Not on file  Tobacco Use   Smoking status: Never   Smokeless tobacco: Never  Vaping Use   Vaping Use: Every day   Substances: Nicotine, Flavoring  Substance and Sexual Activity   Alcohol use: Yes   Drug use: Yes    Types: Marijuana   Sexual activity: Not on file  Other Topics Concern   Not on file   Social History Narrative   Not on file   Social Determinants of Health   Financial Resource Strain: Not on file  Food Insecurity: Not on file  Transportation Needs: Not on file  Physical Activity: Not on file  Stress: Not on file  Social Connections: Not on file    Additional Social History: Patient resides in [place] with her fiance and 11 year oldson. She goes to [employer] for [employer]  Currently she vapes tobacco daily, drinks, alcohol daily, and smokes marijuana daily.  Allergies:   Allergies  Allergen Reactions   Zithromax [Azithromycin] Hives    Metabolic Disorder Labs: Lab Results  Component Value Date   HGBA1C 4.5 (L) 01/12/2022   MPG 82.45 01/12/2022   MPG 88.19 01/08/2021   No results found for: "PROLACTIN" Lab Results  Component Value Date   CHOL 177 01/12/2022   TRIG 42 01/12/2022   HDL 119 01/12/2022   CHOLHDL 1.5 01/12/2022   VLDL 8 01/12/2022   LDLCALC  50 01/12/2022   LDLCALC 38 01/08/2021   Lab Results  Component Value Date   TSH 2.338 01/12/2022    Therapeutic Level Labs: No results found for: "LITHIUM" No results found for: "CBMZ" No results found for: "VALPROATE"  Current Medications: Current Outpatient Medications  Medication Sig Dispense Refill   folic acid (FOLVITE) 1 MG tablet Take 1 tablet (1 mg total) by mouth daily. (Patient not taking: Reported on 01/09/2022) 30 tablet 0   gabapentin (NEURONTIN) 400 MG capsule Take 1 capsule (400 mg total) by mouth 3 (three) times daily. 90 capsule 3   guanFACINE (TENEX) 1 MG tablet Take 1 tablet (1 mg total) by mouth at bedtime. 30 tablet 3   lisinopril (ZESTRIL) 5 MG tablet Take 1 tablet (5 mg total) by mouth daily. 30 tablet 0   Multiple Vitamin (MULTIVITAMIN WITH MINERALS) TABS tablet Take 1 tablet by mouth daily. (Patient not taking: Reported on 01/09/2022)     OLANZapine (ZYPREXA) 10 MG tablet Take 1 tablet (10 mg total) by mouth at bedtime. 30 tablet 3   ondansetron (ZOFRAN ODT) 4 MG  disintegrating tablet Take 1 tablet (4 mg total) by mouth every 8 (eight) hours as needed for nausea or vomiting. (Patient not taking: Reported on 01/09/2022) 10 tablet 0   sertraline (ZOLOFT) 50 MG tablet Take 3 tablets (150 mg total) by mouth daily. 90 tablet 3   sucralfate (CARAFATE) 1 GM/10ML suspension Take 10 mLs (1 g total) by mouth 4 (four) times daily -  with meals and at bedtime. (Patient not taking: Reported on 01/09/2022) 420 mL 0   thiamine 100 MG tablet Take 1 tablet (100 mg total) by mouth daily. (Patient not taking: Reported on 01/09/2022)     traZODone (DESYREL) 100 MG tablet Take 2 tablets (200 mg total) by mouth at bedtime. 60 tablet 3   vitamin B-12 1000 MCG tablet Take 1 tablet (1,000 mcg total) by mouth daily. (Patient not taking: Reported on 01/09/2022) 30 tablet 0   No current facility-administered medications for this visit.    Musculoskeletal: Strength & Muscle Tone: within normal limits and telehealth visit Gait & Station: normal, telehealth visit Patient leans: N/A  Psychiatric Specialty Exam: Review of Systems  There were no vitals taken for this visit.There is no height or weight on file to calculate BMI.  General Appearance: Well Groomed  Eye Contact:  Good  Speech:  Clear and Coherent and Normal Rate  Volume:  Normal  Mood:  Anxious and Depressed  Affect:  Appropriate and Congruent  Thought Process:  Coherent, Goal Directed, and Linear  Orientation:  Full (Time, Place, and Person)  Thought Content:  WDL and Logical  Suicidal Thoughts:  No  Homicidal Thoughts:  No  Memory:  Immediate;   Good Recent;   Good Remote;   Good  Judgement:  Good  Insight:  Good  Psychomotor Activity:  Normal  Concentration:  Concentration: Good and Attention Span: Good  Recall:  Good  Fund of Knowledge:Good  Language: Good  Akathisia:  No  Handed:  Right  AIMS (if indicated):  not done  Assets:  Communication Skills Desire for Improvement Financial  Resources/Insurance Housing Intimacy Physical Health Social Support  ADL's:  Intact  Cognition: WNL  Sleep:  Poor   Screenings: AIMS    Flowsheet Row [facility] ([facility]  AIMS Total Score 0 0  AUDIT    Flowsheet Row Office Visit from 02/20/2022 in [facility] Admission (Discharged[facility] Ad[facility] (Dis[facility]  Alcohol Use Disorder Identification Test Final Score (AUDIT) 17 8 32      GAD-7    Flowsheet Row [facility] Visit from 02/20/2022 in [facility]  Total GAD-7 Score 16      PHQ2-9    Flowsheet Row Office Visit from 02/20/2022 in [facility]  PHQ-2 Total Score 5  PHQ-9 Total Score 15      Flowsheet Row Office Visit from 02/20/2022 in [facility] Admission (Discharged) [facility] ED from 01/09/2022 in [facility]  C-SSRS RISK CATEGORY Error: Q3, 4, or 5 should not be populated when Q2 is No Low Risk High Risk       Assessment and Plan: Patient endorses symptoms of anxiety, depression, insomnia and alcohol dependence.  Today she is agreeable to increasing gabapentin 300 mg 3 times daily to 400 mg 3 times daily to help with anxiety, alcohol dependence, and sleep.  She is also agreeable to increasing Zyprexa 5 mg to 10 mg to help manage her mood and sleep.  Trazodone also increased to 00 mg nightly as needed help manage anxiety, depression, and sleep.  1. Alcohol-induced mood disorder (HCC)  Increase- traZODone (DESYREL) 100 MG tablet; Take 2 tablets (200 mg total) by mouth at bedtime.  Dispense: 60  tablet; Refill: 3 Continue- sertraline (ZOLOFT) 50 MG tablet; Take 3 tablets (150 mg total) by mouth daily.  Dispense: 90 tablet; Refill: 3 Increase- gabapentin (NEURONTIN) 400 MG capsule; Take 1 capsule (400 mg total) by mouth 3 (three) times daily.  Dispense: 90 capsule; Refill: 3 Continue- guanFACINE (TENEX) 1 MG tablet; Take 1 tablet (1 mg total) by mouth at bedtime.  Dispense: 30 tablet; Refill: 3 Increased- OLANZapine (ZYPREXA) 10 MG tablet; Take 1 tablet (10 mg total) by mouth at bedtime.  Dispense: 30 tablet; Refill: 3  2. Generalized anxiety disorder  Increase- traZODone (DESYREL) 100 MG tablet; Take 2 tablets (200 mg total) by mouth at bedtime.  Dispense: 60 tablet; Refill: 3 Continue- sertraline (ZOLOFT) 50 MG tablet; Take 3 tablets (150 mg total) by mouth daily.  Dispense: 90 tablet; Refill: 3 Increase- gabapentin (NEURONTIN) 400 MG capsule; Take 1 capsule (400 mg total) by mouth 3 (three) times daily.  Dispense: 90 capsule; Refill: 3 Increase- OLANZapine (ZYPREXA) 10 MG tablet; Take 1 tablet (10 mg total) by mouth at bedtime.  Dispense: 30 tablet; Refill: 3  3. Mild depression  Increased- traZODone (DESYREL) 100 MG tablet; Take 2 tablets (200 mg total) by mouth at bedtime.  Dispense: 60 tablet; Refill: 3 Continue- sertraline (ZOLOFT) 50 MG tablet; Take 3 tablets (150 mg total) by mouth daily.  Dispense: 90 tablet; Refill: 3 Increased- OLANZapine (ZYPREXA) 10 MG tablet; Take 1 tablet (10 mg total) by mouth at bedtime.  Dispense: 30 tablet; Refill: 3   Collaboration of Care: Other provider involved in patient's care AEB    Patient/Guardian was advised Release of Information must be obtained prior to any record release in order to collaborate their care with an outside provider. Patient/Guardian was advised if they have not already done so to contact the registration department to sign all necessary forms in order for [place] to release information regarding their care.  Consent:  Patient/Guardian gives verbal consent for treatment and assignment of benefits for services provided during this visit. Patient/Guardian expressed understanding and agreed to proceed.   Follow-up in 3 months Shanna CiscoBrittney E Talli Kimmer, NP 6/30/20239:34 AM

## 2022-03-24 ENCOUNTER — Telehealth (HOSPITAL_COMMUNITY): Payer: Self-pay | Admitting: *Deleted

## 2022-03-24 DIAGNOSIS — F32A Depression, unspecified: Secondary | ICD-10-CM

## 2022-03-24 DIAGNOSIS — F411 Generalized anxiety disorder: Secondary | ICD-10-CM

## 2022-03-24 DIAGNOSIS — F1094 Alcohol use, unspecified with alcohol-induced mood disorder: Secondary | ICD-10-CM

## 2022-03-24 MED ORDER — SERTRALINE HCL 100 MG PO TABS: 150.0000 mg | ORAL_TABLET | Freq: Every day | ORAL | 1 refills | Status: DC

## 2022-03-24 NOTE — Telephone Encounter (Signed)
Received message that pharmacy had issues feeling Zoloft prescription as written.  Per pharmacy if it was changed to 100 mg tablets insurance would most likely approved this.  A prescription was changed to reflect this.    Sent: -Zoloft 150 mg daily (1.5 100 mg tablets).  45 (100 mg) tablets with 1 refill.    Arna Snipe MD Resident

## 2022-03-24 NOTE — Addendum Note (Signed)
Addended by: Lauro Franklin on: 03/24/2022 05:34 PM   Modules accepted: Orders

## 2022-03-24 NOTE — Telephone Encounter (Signed)
Call from patient stating she is still waiting on a PA to be done for her zoloft. Called the pharmacy, will only allow a pill and a half on her ins. Believe if the rx dose and sig is changed to sertraline 100 mg take one and a half it will go thru with out a PA. Will forward this request to Dr Renaldo Fiddler who is covering for [facility], [facility], [facility] while she is out of the office.

## 2022-03-31 ENCOUNTER — Other Ambulatory Visit (HOSPITAL_COMMUNITY): Payer: Self-pay | Admitting: Psychiatry

## 2022-03-31 ENCOUNTER — Telehealth (HOSPITAL_COMMUNITY): Payer: Self-pay | Admitting: *Deleted

## 2022-03-31 ENCOUNTER — Other Ambulatory Visit (HOSPITAL_COMMUNITY): Payer: Self-pay | Admitting: *Deleted

## 2022-03-31 DIAGNOSIS — F411 Generalized anxiety disorder: Secondary | ICD-10-CM

## 2022-03-31 DIAGNOSIS — F1094 Alcohol use, unspecified with alcohol-induced mood disorder: Secondary | ICD-10-CM

## 2022-03-31 DIAGNOSIS — F32A Depression, unspecified: Secondary | ICD-10-CM

## 2022-03-31 MED ORDER — SERTRALINE HCL 100 MG PO TABS: 150.0000 mg | ORAL_TABLET | Freq: Every day | ORAL | 1 refills | Status: DC

## 2022-03-31 MED ORDER — OLANZAPINE 10 MG PO TABS: 10.0000 mg | ORAL_TABLET | Freq: Every day | ORAL | 3 refills | Status: DC

## 2022-03-31 MED ORDER — TRAZODONE HCL 100 MG PO TABS: 200.0000 mg | ORAL_TABLET | Freq: Every day | ORAL | 3 refills | Status: DC

## 2022-03-31 MED ORDER — GABAPENTIN 400 MG PO CAPS: 400.0000 mg | ORAL_CAPSULE | Freq: Three times a day (TID) | ORAL | 3 refills | Status: DC

## 2022-03-31 NOTE — Telephone Encounter (Signed)
Medication refilled and sent to preferred pharmacy

## 2022-03-31 NOTE — Telephone Encounter (Signed)
Patient Refill Request Patient states she's out of med's

## 2022-03-31 NOTE — Telephone Encounter (Signed)
Much back in forth calling various businesses as patient never picked up her Zoloft. States she has been without if for several weeks. My last contact with her was last week to do the PA, believed I had resolved it by the provider changing the dose and sig from 50mg  to 100 mg but when [facility] it was saying it was too early for her to fill. I called [facility], they show it was filled at [facility] on [address], it was sitting here never picked up and patient unaware of it at [facility] as I was. M[facility] the Rx to her [facility] at [address] and notified patient it was there later today to pick up.

## 2022-04-03 ENCOUNTER — Telehealth (HOSPITAL_COMMUNITY): Payer: Self-pay | Admitting: *Deleted

## 2022-04-03 NOTE — Telephone Encounter (Signed)
VM stating she needs a prior auth for hydroxyzine. Reviewed chart we dont have an order on medication list for it. Called pharmacy for clarification and its not a pa needed she doesn't have any refills for hydroxyzine and the last time she filled it was from a provider named [facility] which is not a provider at this clinic, I believe he works or worked at the [facility]. I tried to call her back to tell her this was not our rx to write, she would need to see or speak with her provider which at 11 on Friday probably would not happen today. Someone picked up her phone and oddly said she just picked up a dropped phone and she would try to return it, so I was unable to leave a message.

## 2022-05-28 ENCOUNTER — Telehealth (INDEPENDENT_AMBULATORY_CARE_PROVIDER_SITE_OTHER): Payer: Medicaid Other | Admitting: Psychiatry

## 2022-05-28 ENCOUNTER — Telehealth (HOSPITAL_COMMUNITY): Payer: Self-pay | Admitting: *Deleted

## 2022-05-28 ENCOUNTER — Encounter (HOSPITAL_COMMUNITY): Payer: Self-pay | Admitting: Psychiatry

## 2022-05-28 DIAGNOSIS — F411 Generalized anxiety disorder: Secondary | ICD-10-CM

## 2022-05-28 DIAGNOSIS — F1094 Alcohol use, unspecified with alcohol-induced mood disorder: Secondary | ICD-10-CM | POA: Diagnosis not present

## 2022-05-28 MED ORDER — OLANZAPINE 15 MG PO TABS: 15.0000 mg | ORAL_TABLET | Freq: Every day | ORAL | 3 refills | Status: DC

## 2022-05-28 MED ORDER — TRAZODONE HCL 100 MG PO TABS: 200.0000 mg | ORAL_TABLET | Freq: Every day | ORAL | 3 refills | Status: DC

## 2022-05-28 MED ORDER — GUANFACINE HCL 1 MG PO TABS: 1.0000 mg | ORAL_TABLET | Freq: Every day | ORAL | 3 refills | Status: DC

## 2022-05-28 MED ORDER — GABAPENTIN 400 MG PO CAPS: 400.0000 mg | ORAL_CAPSULE | Freq: Three times a day (TID) | ORAL | 3 refills | Status: DC

## 2022-05-28 MED ORDER — HYDROXYZINE HCL 25 MG PO TABS: 25.0000 mg | ORAL_TABLET | Freq: Three times a day (TID) | ORAL | 3 refills | Status: DC | PRN

## 2022-05-28 MED ORDER — SERTRALINE HCL 100 MG PO TABS: 200.0000 mg | ORAL_TABLET | Freq: Every day | ORAL | 3 refills | Status: DC

## 2022-05-28 NOTE — Telephone Encounter (Signed)
PA for Olanzapine has been approved thru 05/29/23. Pharmacy notified.

## 2022-05-28 NOTE — Progress Notes (Signed)
[facility] MD/PA/NP OP Progress Note Virtual Visit via Telephone Note  I connected with Marie Myers on 05/28/22 at 10:00 AM EDT by telephone and verified that I am speaking with the correct person using two identifiers.  Location: Patient: home Provider: Clinic   I discussed the limitations, risks, security and privacy concerns of performing an evaluation and management service by telephone and the availability of in person appointments. I also discussed with the patient that there may be a patient responsible charge related to this service. The patient expressed understanding and agreed to proceed.   I provided 30 minutes of non-face-to-face time during this encounter.   05/28/2022 10:11 AM Marolyn Urschel  MRN:  [id number]  Chief Complaint: "Can Sertraline be increased"  HPI:  31 year old female seen today for follow-up psychiatric evaluation.  She has a psychiatric history of SI, alcohol induced mood disorder, alcohol dependence, marijuana use, and depression.  She is currently managed on Zyprexa 10 mg nightly, trazodone 200 mg nightly as needed, gabapentin 400 mg 3 times daily, Zoloft 150 mg daily, and guanfacine 1 mg daily.  She reports her medications are somewhat effective in managing her psychiatric conditions.   Today she was unable to login versus her assessment on the phone.  During exam she was pleasant, cooperative, engaged in conversation.  She informed Probation officer that since her last visit she continues to have poor memory, is anxious anxious and depressed.  She requested that sertraline be increased.  Patient informed Probation officer that she experienced symptoms of hypomania such as distractibility, irritability, racing thoughts, and fluctuations in mood.  Provider asked patient if she was using alcohol daily and she notes that she was.  Provider conducted an audit assessment and patient scored an 18, at her last visit she scored a 17.  Provider gave patient resources to [facility] rehabilitation  facility.  Provider also informed patient that alcohol can affect her memory and her mental health.  She endorsed understanding and agreed.  Patient reports that her anxiety and depression continues to be problematic.  Provider conducted a GAD-7 and patient scored an 11, at her last visit she scored a 16.  Provider also conducted a PHQ-9 patient scored an 18, at her last visit she scored a 15.  She notes her sleep continues to be poor and request that trazodone or Zyprexa be increased.  She notes that she sleeps approximately 4 to 5 hours nightly.  Today she endorses passive SI but denies wanting to harm herself today.  She denies SI/HI/AVH.  Patient notes at times she feels that she is in a dreamlike state.  She also notes that she feels paranoid at times.  Patient informed Probation officer that she stopped school [facility]) because she was unable to afford it.  She now works as an Radio broadcast assistant at [employer] and finds enjoyment in her job.   Today patient is agreeable to increasing Zyprexa 10 mg to 15 mg daily to help manage anxiety, mood, and sleep.  She is also agreeable to increasing Zoloft 150 mg to 200 mg mg daily patient to help manage anxiety and depression.  Hydroxyzine 25 mg 3 times daily restarted to help manage anxiety as needed.  She will continue all other medications as prescribed.  No other concerns noted at this time. Visit Diagnosis:    ICD-10-CM   1. Alcohol-induced mood disorder (HCC)  F10.94 gabapentin (NEURONTIN) 400 MG capsule    guanFACINE (TENEX) 1 MG tablet    OLANZapine (ZYPREXA) 15 MG tablet  sertraline (ZOLOFT) 100 MG tablet    traZODone (DESYREL) 100 MG tablet    2. Generalized anxiety disorder  F41.1 gabapentin (NEURONTIN) 400 MG capsule    OLANZapine (ZYPREXA) 15 MG tablet    sertraline (ZOLOFT) 100 MG tablet    traZODone (DESYREL) 100 MG tablet    hydrOXYzine (ATARAX) 25 MG tablet      Past Psychiatric History:  SI, alcohol induced mood disorder, alcohol  dependence, marijuana use, and depression Past Medical History:  Past Medical History:  Diagnosis Date   Anxiety    Depression    Insomnia    Marijuana use 01/08/2021   Moderate alcohol use disorder (HCC) 01/08/2021    Past Surgical History:  Procedure Laterality Date   CHOLECYSTECTOMY     GASTRIC BYPASS     GASTRIC BYPASS     LAPAROSCOPY N/A 08/11/2019   Procedure: LAPAROSCOPY DIAGNOSTIC;  Surgeon: Harriette Bouillon, MD;  Location: [facility] OR;  Service: General;  Laterality: N/A;    Family Psychiatric History: Sister depression, Father alcohol use  Family History:  Family History  Problem Relation Age of Onset   Diabetes Mother    Diabetes Father     Social History:  Social History   Socioeconomic History   Marital status: Single    Spouse name: Not on file   Number of children: Not on file   Years of education: Not on file   Highest education level: Not on file  Occupational History   Not on file  Tobacco Use   Smoking status: Never   Smokeless tobacco: Never  Vaping Use   Vaping Use: Every day   Substances: Nicotine, Flavoring  Substance and Sexual Activity   Alcohol use: Yes   Drug use: Yes    Types: Marijuana   Sexual activity: Not on file  Other Topics Concern   Not on file  Social History Narrative   Not on file   Social Determinants of Health   Financial Resource Strain: Not on file  Food Insecurity: Not on file  Transportation Needs: Not on file  Physical Activity: Not on file  Stress: Not on file  Social Connections: Not on file    Allergies:  Allergies  Allergen Reactions   Zithromax [Azithromycin] Hives    Metabolic Disorder Labs: Lab Results  Component Value Date   HGBA1C 4.5 (L) 01/12/2022   MPG 82.45 01/12/2022   MPG 88.19 01/08/2021   No results found for: "PROLACTIN" Lab Results  Component Value Date   CHOL 177 01/12/2022   TRIG 42 01/12/2022   HDL 119 01/12/2022   CHOLHDL 1.5 01/12/2022   VLDL 8 01/12/2022   LDLCALC 50  01/12/2022   LDLCALC 38 01/08/2021   Lab Results  Component Value Date   TSH 2.338 01/12/2022   TSH 1.087 01/08/2021    Therapeutic Level Labs: No results found for: "LITHIUM" No results found for: "VALPROATE" No results found for: "CBMZ"  Current Medications: Current Outpatient Medications  Medication Sig Dispense Refill   hydrOXYzine (ATARAX) 25 MG tablet Take 1 tablet (25 mg total) by mouth 3 (three) times daily as needed. 90 tablet 3   folic acid (FOLVITE) 1 MG tablet Take 1 tablet (1 mg total) by mouth daily. (Patient not taking: Reported on 01/09/2022) 30 tablet 0   gabapentin (NEURONTIN) 400 MG capsule Take 1 capsule (400 mg total) by mouth 3 (three) times daily. 90 capsule 3   guanFACINE (TENEX) 1 MG tablet Take 1 tablet (1 mg total) by mouth  at bedtime. 30 tablet 3   lisinopril (ZESTRIL) 5 MG tablet Take 1 tablet (5 mg total) by mouth daily. 30 tablet 0   Multiple Vitamin (MULTIVITAMIN WITH MINERALS) TABS tablet Take 1 tablet by mouth daily. (Patient not taking: Reported on 01/09/2022)     OLANZapine (ZYPREXA) 15 MG tablet Take 1 tablet (15 mg total) by mouth at bedtime. 30 tablet 3   ondansetron (ZOFRAN ODT) 4 MG disintegrating tablet Take 1 tablet (4 mg total) by mouth every 8 (eight) hours as needed for nausea or vomiting. (Patient not taking: Reported on 01/09/2022) 10 tablet 0   sertraline (ZOLOFT) 100 MG tablet Take 2 tablets (200 mg total) by mouth daily. 60 tablet 3   sucralfate (CARAFATE) 1 GM/10ML suspension Take 10 mLs (1 g total) by mouth 4 (four) times daily -  with meals and at bedtime. (Patient not taking: Reported on 01/09/2022) 420 mL 0   thiamine 100 MG tablet Take 1 tablet (100 mg total) by mouth daily. (Patient not taking: Reported on 01/09/2022)     traZODone (DESYREL) 100 MG tablet Take 2 tablets (200 mg total) by mouth at bedtime. 60 tablet 3   vitamin B-12 1000 MCG tablet Take 1 tablet (1,000 mcg total) by mouth daily. (Patient not taking: Reported on  01/09/2022) 30 tablet 0   No current facility-administered medications for this visit.     Musculoskeletal: Strength & Muscle Tone:  Unable to assess due to telephone visit Gait & Station:  Unable to assess due to telephone visit Patient leans: N/A  Psychiatric Specialty Exam: Review of Systems  There were no vitals taken for this visit.There is no height or weight on file to calculate BMI.  General Appearance:  Unable to assess due to telephone visit  Eye Contact:   Unable to assess due to telephone visit  Speech:  Clear and Coherent and Normal Rate  Volume:  Normal  Mood:  Anxious and Depressed  Affect:  Appropriate and Congruent  Thought Process:  Coherent, Goal Directed, and Linear  Orientation:  Full (Time, Place, and Person)  Thought Content: WDL, Logical, and Paranoid Ideation   Suicidal Thoughts:  Yes.  without intent/plan  Homicidal Thoughts:  No  Memory:  Immediate;   Good Recent;   Good Remote;   Good  Judgement:  Good  Insight:  Good  Psychomotor Activity:   Unable to assess due to telephone visit  Concentration:  Concentration: Good and Attention Span: Good  Recall:  Good  Fund of Knowledge: Good  Language: Good  Akathisia:   Unable to assess due to telephone visit  Handed:  Right  AIMS (if indicated): not done  Assets:  Communication Skills Desire for Improvement Financial Resources/Insurance Housing Physical Health Social Support  ADL's:  Intact  Cognition: WNL  Sleep:  Fair   Screenings: AIMS    Flowsheet Row [facility] ([facility]  AIMS Total Score 0 0      AUDIT    Flowsheet Row Video Visit from 05/28/2022 in [facility] Office Visit from [facility]/20/2022 in [facility]) [facility] (Dis[facility]  Alcohol Use Disorder Identification Test Final Score (AUDIT) 18 17 8  32      GAD-7    Flowsheet Row Video Visit from  05/28/2022 in [facility] Office Visit from 02/20/2022 in [facility]  Total GAD-7 Score 11 16      PHQ2-9    Flowsheet Row Video Visit from 05/28/2022 in [facility] Office Visit from 02/20/2022 in [facility]  PHQ-2 Total Score 5 5  PHQ-9 Total Score 18 15      Flowsheet Row Video Visit from 05/28/2022 in [facility] Office Visit from 02/20/2022 in [facility] Admission (Discharged) [facility]  C-SSRS RISK CATEGORY Error: Q7 should not be populated when Q6 is No Error: Q3, 4, or 5 should not be populated when Q2 is No Low Risk        Assessment and Plan: Patient endorses symptoms of alcohol use, anxiety, depression, and insomnia.Today patient is agreeable to increasing Zyprexa 10 mg to 15 mg daily to help manage anxiety, mood, and sleep.  She is also agreeable to increasing Zoloft 150 mg to 200 mg mg daily patient to help manage anxiety and depression.  Hydroxyzine 25 mg 3 times daily restarted to help manage anxiety as needed.  She will continue all other medications as prescribed.  1. Alcohol-induced mood disorder (HCC)  Continue- gabapentin (NEURONTIN) 400 MG capsule; Take 1 capsule (400 mg total) by mouth 3 (three) times daily.  Dispense: 90 capsule; Refill: 3 Continue- guanFACINE (TENEX) 1 MG tablet; Take 1 tablet (1 mg total) by mouth at bedtime.  Dispense: 30 tablet; Refill: 3 Increased- OLANZapine (ZYPREXA) 15 MG tablet; Take 1 tablet (15 mg total) by mouth at bedtime.  Dispense: 30 tablet; Refill: 3 Increased- sertraline (ZOLOFT) 100 MG  tablet; Take 2 tablets (200 mg total) by mouth daily.  Dispense: 60 tablet; Refill: 3 Continue- traZODone (DESYREL) 100 MG tablet; Take 2 tablets (200 mg total) by mouth at bedtime.  Dispense: 60 tablet; Refill: 3  2. Generalized anxiety disorder  Continue- gabapentin (NEURONTIN) 400 MG capsule; Take 1 capsule (400 mg total) by mouth 3 (three) times daily.  Dispense: 90 capsule; Refill: 3 Increased- OLANZapine (ZYPREXA) 15 MG tablet; Take 1 tablet (15 mg total) by mouth at bedtime.  Dispense: 30 tablet; Refill: 3 Increased- sertraline (ZOLOFT) 100 MG tablet; Take 2 tablets (200 mg total) by mouth daily.  Dispense: 60 tablet; Refill: 3 Continue- traZODone (DESYREL) 100 MG tablet; Take 2 tablets (200 mg total) by mouth at bedtime.  Dispense: 60 tablet; Refill: 3 Start- hydrOXYzine (ATARAX) 25 MG tablet; Take 1 tablet (25 mg total) by mouth 3 (three) times daily as needed.  Dispense: 90 tablet; Refill: 3    Collaboration of Care: Collaboration of Care: Other provider involved in patient's care AEB PCP  Patient/Guardian was advised Release of Information must be obtained prior to any record release in order to collaborate their care with an outside provider. Patient/Guardian was advised if they have not already done so to contact the registration department to sign all necessary forms in order for [place] to release information regarding their care.   Consent: Patient/Guardian gives verbal consent for treatment and assignment of benefits for services provided during this visit. Patient/Guardian expressed understanding and agreed to proceed.   Follow-up in 3 months Shanna Cisco, NP 05/28/2022, 10:11 AM

## 2022-05-28 NOTE — Telephone Encounter (Signed)
PA received from [place] for her Olanzapine. Submitted via phone thru [employer], waiting on the determination.

## 2022-07-14 ENCOUNTER — Emergency Department (HOSPITAL_COMMUNITY)
Admission: EM | Admit: 2022-07-14 | Discharge: 2022-07-14 | Discharge status: Against Medical Advice | Payer: Medicaid Other | Attending: Emergency Medicine | Admitting: Emergency Medicine

## 2022-07-14 DIAGNOSIS — Z5329 Procedure and treatment not carried out because of patient's decision for other reasons: Secondary | ICD-10-CM | POA: Insufficient documentation

## 2022-07-14 DIAGNOSIS — R1013 Epigastric pain: Secondary | ICD-10-CM | POA: Insufficient documentation

## 2022-07-14 DIAGNOSIS — R1012 Left upper quadrant pain: Secondary | ICD-10-CM | POA: Diagnosis not present

## 2022-07-14 MED ORDER — ONDANSETRON 8 MG PO TBDP
8.0000 mg | ORAL_TABLET | Freq: Once | ORAL | Status: DC
Start: 2022-07-14 — End: 2022-07-14

## 2022-07-14 NOTE — ED Provider Notes (Signed)
[facility]   CSN: [id number] Arrival date & time: 07/14/22  0947     History  Chief Complaint  Patient presents with   Abdominal Pain    Marie Myers is a 31 y.o. female.  HPI  -year-old female history of gastric bypass 7 years ago, history of alcohol abuse, states she has not been drinking alcohol presents today complaining of epigastric pain radiating to her left flank.  She states this began yesterday.  She has had associated nausea and vomiting.  States she has had similar symptoms in the past due to her gastric bypass and has required hospitalization with this.  She denies any recent alcohol use.  She denies fever, chills, diarrhea.  She has vomited this material.      Home Medications Prior to Admission medications   Medication Sig Start Date End Date Taking? Authorizing Provider  folic acid (FOLVITE) 1 MG tablet Take 1 tablet (1 mg total) by mouth daily. Patient not taking: Reported on 01/09/2022 01/14/21   Laveda Abbe, NP  gabapentin (NEURONTIN) 400 MG capsule Take 1 capsule (400 mg total) by mouth 3 (three) times daily. 05/28/22   Shanna Cisco, NP  guanFACINE (TENEX) 1 MG tablet Take 1 tablet (1 mg total) by mouth at bedtime. 05/28/22   Shanna Cisco, NP  hydrOXYzine (ATARAX) 25 MG tablet Take 1 tablet (25 mg total) by mouth 3 (three) times daily as needed. 05/28/22   Toy Cookey E, NP  lisinopril (ZESTRIL) 5 MG tablet Take 1 tablet (5 mg total) by mouth daily. 01/14/22 02/13/22  Massengill, Harrold Donath, MD  Multiple Vitamin (MULTIVITAMIN WITH MINERALS) TABS tablet Take 1 tablet by mouth daily. Patient not taking: Reported on 01/09/2022 01/14/21   Laveda Abbe, NP  OLANZapine (ZYPREXA) 15 MG tablet Take 1 tablet (15 mg total) by mouth at bedtime. 05/28/22   Shanna Cisco, NP  ondansetron (ZOFRAN ODT) 4 MG disintegrating tablet Take 1 tablet (4 mg total) by mouth every 8 (eight) hours as  needed for nausea or vomiting. Patient not taking: Reported on 01/09/2022 01/19/21   Caccavale, Sophia, PA-C  sertraline (ZOLOFT) 100 MG tablet Take 2 tablets (200 mg total) by mouth daily. 05/28/22   Shanna Cisco, NP  sucralfate (CARAFATE) 1 GM/10ML suspension Take 10 mLs (1 g total) by mouth 4 (four) times daily -  with meals and at bedtime. Patient not taking: Reported on 01/09/2022 01/19/21   Caccavale, Sophia, PA-C  thiamine 100 MG tablet Take 1 tablet (100 mg total) by mouth daily. Patient not taking: Reported on 01/09/2022 01/14/21   Laveda Abbe, NP  traZODone (DESYREL) 100 MG tablet Take 2 tablets (200 mg total) by mouth at bedtime. 05/28/22   Shanna Cisco, NP  vitamin B-12 1000 MCG tablet Take 1 tablet (1,000 mcg total) by mouth daily. Patient not taking: Reported on 01/09/2022 01/14/21   Laveda Abbe, NP      Allergies    Zithromax [azithromycin]    Review of Systems   Review of Systems  Physical Exam Updated Vital Signs BP (!) 164/116 (BP Location: Left Arm)   Pulse 79   Temp 98.7 F (37.1 C) (Oral)   Resp 16   Ht 1.676 m (5\' 6" )   Wt 93.4 kg   LMP 06/30/2022   SpO2 100%   BMI 33.25 kg/m  Physical Exam Vitals and nursing note reviewed.  Constitutional:      Appearance: She is well-developed.  HENT:     Mouth/Throat:     Mouth: Mucous membranes are moist.  Eyes:     Extraocular Movements: Extraocular movements intact.  Cardiovascular:     Rate and Rhythm: Normal rate and regular rhythm.  Pulmonary:     Effort: Pulmonary effort is normal.     Breath sounds: Normal breath sounds.  Abdominal:     General: Abdomen is protuberant. Bowel sounds are normal.     Palpations: Abdomen is soft.     Tenderness: There is abdominal tenderness in the epigastric area and left upper quadrant.  Genitourinary:    Rectum: Normal.  Skin:    General: Skin is warm.     Capillary Refill: Capillary refill takes less than 2 seconds.  Neurological:      General: No focal deficit present.     Mental Status: She is alert.  Psychiatric:        Mood and Affect: Mood normal.     ED Results / Procedures / Treatments   Labs (all labs ordered are listed, but only abnormal results are displayed) Labs Reviewed  LIPASE, BLOOD  COMPREHENSIVE METABOLIC PANEL  CBC  URINALYSIS, ROUTINE W REFLEX MICROSCOPIC  PREGNANCY, URINE  ETHANOL  RAPID URINE DRUG SCREEN, HOSP PERFORMED    EKG None  Radiology No results found.  Procedures Procedures    Medications Ordered in ED Medications  ondansetron (ZOFRAN-ODT) disintegrating tablet 8 mg (has no administration in time range)    ED Course/ Medical Decision Making/ A&P                           Medical Decision Making DDX- Gastritis, pancreatitis, sbo, other complications of gastric bypass  Informed by nursing that patient left due to time concerns  Amount and/or Complexity of Data Reviewed Labs: ordered. Radiology: ordered.  Risk Prescription drug management.           Final Clinical Impression(s) / ED Diagnoses Final diagnoses:  Epigastric pain    Rx / DC Orders ED Discharge Orders     None         Margarita Grizzle, MD 07/14/22 1058

## 2022-07-14 NOTE — ED Triage Notes (Signed)
Pt states she has been vomiting since yesterday. Pt also reports epigastric and LUQ pain. States she went to PCP and was told to be evaluated at the ER.

## 2022-07-14 NOTE — ED Notes (Signed)
Pt states she is leaving. States she will go home and see if she feels better.

## 2022-07-14 NOTE — ED Notes (Signed)
Pt sts she is going home and will return if symptoms worsen. RN made aware

## 2022-08-22 ENCOUNTER — Emergency Department (HOSPITAL_COMMUNITY): Payer: Medicaid Other

## 2022-08-22 ENCOUNTER — Encounter (HOSPITAL_COMMUNITY): Payer: Self-pay

## 2022-08-22 ENCOUNTER — Observation Stay (HOSPITAL_COMMUNITY)
Admission: EM | Admit: 2022-08-22 | Discharge: 2022-08-23 | Disposition: A | Discharge status: Home / Self Care | Payer: Medicaid Other | Attending: Internal Medicine | Admitting: Internal Medicine

## 2022-08-22 DIAGNOSIS — Z789 Other specified health status: Secondary | ICD-10-CM

## 2022-08-22 DIAGNOSIS — E876 Hypokalemia: Secondary | ICD-10-CM | POA: Diagnosis not present

## 2022-08-22 DIAGNOSIS — G40909 Epilepsy, unspecified, not intractable, without status epilepticus: Secondary | ICD-10-CM | POA: Diagnosis not present

## 2022-08-22 DIAGNOSIS — Z79899 Other long term (current) drug therapy: Secondary | ICD-10-CM | POA: Insufficient documentation

## 2022-08-22 DIAGNOSIS — Z1152 Encounter for screening for COVID-19: Secondary | ICD-10-CM | POA: Diagnosis not present

## 2022-08-22 DIAGNOSIS — I10 Essential (primary) hypertension: Secondary | ICD-10-CM | POA: Insufficient documentation

## 2022-08-22 DIAGNOSIS — R569 Unspecified convulsions: Secondary | ICD-10-CM

## 2022-08-22 DIAGNOSIS — R29818 Other symptoms and signs involving the nervous system: Secondary | ICD-10-CM | POA: Diagnosis present

## 2022-08-22 HISTORY — DX: Bariatric surgery status: Z98.84

## 2022-08-22 LAB — CBC WITH DIFFERENTIAL/PLATELET
Abs Immature Granulocytes: 0.05 10*3/uL (ref 0.00–0.07)
Basophils Absolute: 0 10*3/uL (ref 0.0–0.1)
Basophils Relative: 0 %
Eosinophils Absolute: 0 10*3/uL (ref 0.0–0.5)
Eosinophils Relative: 0 %
HCT: 38.3 % (ref 36.0–46.0)
Hemoglobin: 12.4 g/dL (ref 12.0–15.0)
Immature Granulocytes: 0 %
Lymphocytes Relative: 3 %
Lymphs Abs: 0.4 10*3/uL — ABNORMAL LOW (ref 0.7–4.0)
MCH: 34.3 pg — ABNORMAL HIGH (ref 26.0–34.0)
MCHC: 32.4 g/dL (ref 30.0–36.0)
MCV: 105.8 fL — ABNORMAL HIGH (ref 80.0–100.0)
Monocytes Absolute: 0.7 10*3/uL (ref 0.1–1.0)
Monocytes Relative: 6 %
Neutro Abs: 11.5 10*3/uL — ABNORMAL HIGH (ref 1.7–7.7)
Neutrophils Relative %: 91 %
Platelets: 218 10*3/uL (ref 150–400)
RBC: 3.62 MIL/uL — ABNORMAL LOW (ref 3.87–5.11)
RDW: 13.8 % (ref 11.5–15.5)
WBC: 12.7 10*3/uL — ABNORMAL HIGH (ref 4.0–10.5)
nRBC: 0 % (ref 0.0–0.2)

## 2022-08-22 LAB — COMPREHENSIVE METABOLIC PANEL
ALT: 11 U/L (ref 0–44)
AST: 27 U/L (ref 15–41)
Albumin: 3.7 g/dL (ref 3.5–5.0)
Alkaline Phosphatase: 75 U/L (ref 38–126)
Anion gap: 10 (ref 5–15)
BUN: <5 mg/dL — ABNORMAL LOW (ref 6–20)
CO2: 24 mmol/L (ref 22–32)
Calcium: 8.9 mg/dL (ref 8.9–10.3)
Chloride: 102 mmol/L (ref 98–111)
Creatinine, Ser: 0.75 mg/dL (ref 0.44–1.00)
GFR, Estimated: >60 mL/min (ref >60)
Glucose, Bld: 111 mg/dL — ABNORMAL HIGH (ref 70–99)
Potassium: 3.3 mmol/L — ABNORMAL LOW (ref 3.5–5.1)
Sodium: 136 mmol/L (ref 135–145)
Total Bilirubin: 1.3 mg/dL — ABNORMAL HIGH (ref 0.3–1.2)
Total Protein: 6.7 g/dL (ref 6.5–8.1)

## 2022-08-22 LAB — CBG MONITORING, ED: Glucose-Capillary: 126 mg/dL — ABNORMAL HIGH (ref 70–99)

## 2022-08-22 LAB — ETHANOL: Alcohol, Ethyl (B): <10 mg/dL (ref <10)

## 2022-08-22 LAB — PHOSPHORUS: Phosphorus: 2.1 mg/dL — ABNORMAL LOW (ref 2.5–4.6)

## 2022-08-22 LAB — RESP PANEL BY RT-PCR (RSV, FLU A&B, COVID)  RVPGX2
Influenza A by PCR: NEGATIVE
Influenza B by PCR: NEGATIVE
Resp Syncytial Virus by PCR: NEGATIVE
SARS Coronavirus 2 by RT PCR: NEGATIVE

## 2022-08-22 LAB — I-STAT BETA HCG BLOOD, ED (MC, WL, AP ONLY): I-stat hCG, quantitative: <5 m[IU]/mL (ref <5)

## 2022-08-22 LAB — MAGNESIUM: Magnesium: 2.1 mg/dL (ref 1.7–2.4)

## 2022-08-22 LAB — LIPASE, BLOOD: Lipase: 32 U/L (ref 11–51)

## 2022-08-22 MED ORDER — LEVETIRACETAM IN NACL 1500 MG/100ML IV SOLN
1500.0000 mg | Freq: Once | INTRAVENOUS | Status: AC
  Administered 2022-08-22: 1500 mg via INTRAVENOUS
  Filled 2022-08-22: qty 100

## 2022-08-22 MED ORDER — LISINOPRIL 10 MG PO TABS
5.0000 mg | ORAL_TABLET | Freq: Every day | ORAL | Status: DC
  Administered 2022-08-23: 5 mg via ORAL
  Filled 2022-08-22: qty 1

## 2022-08-22 MED ORDER — LEVETIRACETAM IN NACL 500 MG/100ML IV SOLN
500.0000 mg | Freq: Once | INTRAVENOUS | Status: AC
  Administered 2022-08-22: 500 mg via INTRAVENOUS
  Filled 2022-08-22: qty 100

## 2022-08-22 MED ORDER — VITAMIN B-12 1000 MCG PO TABS
1000.0000 ug | ORAL_TABLET | Freq: Every day | ORAL | Status: DC
  Administered 2022-08-23: 1000 ug via ORAL
  Filled 2022-08-22: qty 1

## 2022-08-22 MED ORDER — SODIUM CHLORIDE 0.9 % IV SOLN
75.0000 mL/h | INTRAVENOUS | Status: DC
  Administered 2022-08-22: 75 mL/h via INTRAVENOUS

## 2022-08-22 MED ORDER — SODIUM CHLORIDE 0.9 % IV SOLN: 2000.0000 mg | Freq: Once | INTRAVENOUS | Status: DC

## 2022-08-22 MED ORDER — ADULT MULTIVITAMIN W/MINERALS CH
1.0000 | ORAL_TABLET | Freq: Every day | ORAL | Status: DC
  Administered 2022-08-23: 1 via ORAL
  Filled 2022-08-22: qty 1

## 2022-08-22 MED ORDER — ACETAMINOPHEN 650 MG RE SUPP: 650.0000 mg | Freq: Four times a day (QID) | RECTAL | Status: DC | PRN

## 2022-08-22 MED ORDER — TRAZODONE HCL 50 MG PO TABS: 200.0000 mg | ORAL_TABLET | Freq: Every day | ORAL | Status: DC

## 2022-08-22 MED ORDER — ENOXAPARIN SODIUM 40 MG/0.4ML IJ SOSY
40.0000 mg | PREFILLED_SYRINGE | INTRAMUSCULAR | Status: DC
  Administered 2022-08-22: 40 mg via SUBCUTANEOUS
  Filled 2022-08-22: qty 0.4

## 2022-08-22 MED ORDER — LORAZEPAM 1 MG PO TABS: 1.0000 mg | ORAL_TABLET | ORAL | Status: DC | PRN

## 2022-08-22 MED ORDER — SERTRALINE HCL 100 MG PO TABS
200.0000 mg | ORAL_TABLET | Freq: Every day | ORAL | Status: DC
  Administered 2022-08-23: 200 mg via ORAL
  Filled 2022-08-22: qty 2

## 2022-08-22 MED ORDER — POTASSIUM CHLORIDE 10 MEQ/100ML IV SOLN
10.0000 meq | INTRAVENOUS | Status: AC
  Administered 2022-08-23: 10 meq via INTRAVENOUS

## 2022-08-22 MED ORDER — LORAZEPAM 2 MG/ML IJ SOLN
INTRAMUSCULAR | Status: AC
  Filled 2022-08-22: qty 1

## 2022-08-22 MED ORDER — ORAL CARE MOUTH RINSE: 15.0000 mL | OROMUCOSAL | Status: DC | PRN

## 2022-08-22 MED ORDER — LORAZEPAM 2 MG/ML IJ SOLN
1.0000 mg | Freq: Once | INTRAMUSCULAR | Status: AC
  Administered 2022-08-22: 1 mg via INTRAVENOUS
  Filled 2022-08-22: qty 1

## 2022-08-22 MED ORDER — THIAMINE HCL 100 MG/ML IJ SOLN: 100.0000 mg | Freq: Every day | INTRAMUSCULAR | Status: DC

## 2022-08-22 MED ORDER — ORAL CARE MOUTH RINSE
15.0000 mL | OROMUCOSAL | Status: DC
  Administered 2022-08-23 (×2): 15 mL via OROMUCOSAL

## 2022-08-22 MED ORDER — THIAMINE MONONITRATE 100 MG PO TABS
100.0000 mg | ORAL_TABLET | Freq: Every day | ORAL | Status: DC
  Administered 2022-08-23: 100 mg via ORAL
  Filled 2022-08-22 (×2): qty 1

## 2022-08-22 MED ORDER — GABAPENTIN 300 MG PO CAPS: 400.0000 mg | ORAL_CAPSULE | Freq: Three times a day (TID) | ORAL | Status: DC

## 2022-08-22 MED ORDER — FOLIC ACID 1 MG PO TABS
1.0000 mg | ORAL_TABLET | Freq: Every day | ORAL | Status: DC
  Administered 2022-08-23: 1 mg via ORAL
  Filled 2022-08-22 (×2): qty 1

## 2022-08-22 MED ORDER — LORAZEPAM 2 MG/ML IJ SOLN: 1.0000 mg | INTRAMUSCULAR | Status: DC | PRN

## 2022-08-22 MED ORDER — OLANZAPINE 10 MG PO TABS: 15.0000 mg | ORAL_TABLET | Freq: Every day | ORAL | Status: DC

## 2022-08-22 MED ORDER — ACETAMINOPHEN 325 MG PO TABS: 650.0000 mg | ORAL_TABLET | Freq: Four times a day (QID) | ORAL | Status: DC | PRN

## 2022-08-22 NOTE — ED Notes (Signed)
IV Attempted by this RN without success. EMS attempted twice with no success. Will notify MD.

## 2022-08-22 NOTE — ED Notes (Signed)
CT alerted RN at this time. Pt was in CT. After CT scan, pt had her 3rd seizure episode. CT techs state pt had full body shaking. Pt was post ictal when RN arrived in CT. Taken back to room. Pt was very confused and was trying to get out of bed. Started begging staff if she could go to the bathroom. Purewick in place. Pt was incontinent of stool at this time. Ativan IVP and Keppra initiated. Will continue to monitor. Cardiac monitoring in place.

## 2022-08-22 NOTE — ED Provider Notes (Signed)
[facility] EMERGENCY DEPARTMENT Provider Note   CSN: [id number] Arrival date & time: 08/22/22  1619     History  Chief Complaint  Patient presents with   Seizures    Cammy Sanjurjo is a 31 y.o. female.  Patient here after seizure-like activity.  States that she will drink 1-2 alcoholic beverages a day but not very consistently.  History of marijuana use, anxiety, depression.  She is on psych medications.  No new medications.  She is not always compliant with them.  She denies any chest pain or shortness of breath.  1 episode was witnessed by the husband may be lasting 1 to 2 minutes with some confusion and unresponsiveness.  Another episode was at work hours later witnessed by coworker who EMS states may be lasted 1 to 2 minutes with some body stiffening.  May be some confusion afterwards.  She is back at her baseline now.  She denies any discomfort except for headache.  She did hit her head when she fell.  She states her last alcoholic beverage was 5 or 6 days ago.  She denies any abdominal pain, nausea, vomiting.  The history is provided by the patient.       Home Medications Prior to Admission medications   Medication Sig Start Date End Date Taking? Authorizing Provider  folic acid (FOLVITE) 1 MG tablet Take 1 tablet (1 mg total) by mouth daily. Patient not taking: Reported on 01/09/2022 01/14/21   Laveda Abbe, NP  gabapentin (NEURONTIN) 400 MG capsule Take 1 capsule (400 mg total) by mouth 3 (three) times daily. 05/28/22   Shanna Cisco, NP  guanFACINE (TENEX) 1 MG tablet Take 1 tablet (1 mg total) by mouth at bedtime. 05/28/22   Shanna Cisco, NP  hydrOXYzine (ATARAX) 25 MG tablet Take 1 tablet (25 mg total) by mouth 3 (three) times daily as needed. 05/28/22   Toy Cookey E, NP  lisinopril (ZESTRIL) 5 MG tablet Take 1 tablet (5 mg total) by mouth daily. 01/14/22 02/13/22  Massengill, Harrold Donath, MD  Multiple Vitamin (MULTIVITAMIN WITH  MINERALS) TABS tablet Take 1 tablet by mouth daily. Patient not taking: Reported on 01/09/2022 01/14/21   Laveda Abbe, NP  OLANZapine (ZYPREXA) 15 MG tablet Take 1 tablet (15 mg total) by mouth at bedtime. 05/28/22   Shanna Cisco, NP  ondansetron (ZOFRAN ODT) 4 MG disintegrating tablet Take 1 tablet (4 mg total) by mouth every 8 (eight) hours as needed for nausea or vomiting. Patient not taking: Reported on 01/09/2022 01/19/21   Caccavale, Sophia, PA-C  sertraline (ZOLOFT) 100 MG tablet Take 2 tablets (200 mg total) by mouth daily. 05/28/22   Shanna Cisco, NP  sucralfate (CARAFATE) 1 GM/10ML suspension Take 10 mLs (1 g total) by mouth 4 (four) times daily -  with meals and at bedtime. Patient not taking: Reported on 01/09/2022 01/19/21   Caccavale, Sophia, PA-C  thiamine 100 MG tablet Take 1 tablet (100 mg total) by mouth daily. Patient not taking: Reported on 01/09/2022 01/14/21   Laveda Abbe, NP  traZODone (DESYREL) 100 MG tablet Take 2 tablets (200 mg total) by mouth at bedtime. 05/28/22   Shanna Cisco, NP  vitamin B-12 1000 MCG tablet Take 1 tablet (1,000 mcg total) by mouth daily. Patient not taking: Reported on 01/09/2022 01/14/21   Laveda Abbe, NP      Allergies    Zithromax [azithromycin]    Review of Systems   Review of  Systems  Physical Exam Updated Vital Signs BP (!) 154/101   Pulse 100   Temp 98.7 F (37.1 C) (Oral)   Resp (!) 21   Ht 5\' 6"  (1.676 m)   Wt 89.8 kg   SpO2 97%   BMI 31.96 kg/m  Physical Exam Vitals and nursing note reviewed.  Constitutional:      General: She is not in acute distress.    Appearance: She is well-developed.  HENT:     Head:     Comments: Abrasion to forehead    Nose: Nose normal.     Mouth/Throat:     Mouth: Mucous membranes are moist.  Eyes:     Extraocular Movements: Extraocular movements intact.     Conjunctiva/sclera: Conjunctivae normal.     Pupils: Pupils are equal, round, and reactive  to light.  Cardiovascular:     Rate and Rhythm: Normal rate and regular rhythm.     Pulses: Normal pulses.     Heart sounds: Normal heart sounds. No murmur heard. Pulmonary:     Effort: Pulmonary effort is normal. No respiratory distress.     Breath sounds: Normal breath sounds.  Abdominal:     Palpations: Abdomen is soft.     Tenderness: There is no abdominal tenderness.  Musculoskeletal:        General: No swelling.     Cervical back: Neck supple.  Skin:    General: Skin is warm and dry.     Capillary Refill: Capillary refill takes less than 2 seconds.  Neurological:     General: No focal deficit present.     Mental Status: She is alert and oriented to person, place, and time.     Cranial Nerves: No cranial nerve deficit.     Sensory: No sensory deficit.     Motor: No weakness.     Coordination: Coordination normal.  Psychiatric:        Mood and Affect: Mood normal.     ED Results / Procedures / Treatments   Labs (all labs ordered are listed, but only abnormal results are displayed) Labs Reviewed  CBC WITH DIFFERENTIAL/PLATELET - Abnormal; Notable for the following components:      Result Value   WBC 12.7 (*)    RBC 3.62 (*)    MCV 105.8 (*)    MCH 34.3 (*)    Neutro Abs 11.5 (*)    Lymphs Abs 0.4 (*)    All other components within normal limits  COMPREHENSIVE METABOLIC PANEL - Abnormal; Notable for the following components:   Potassium 3.3 (*)    Glucose, Bld 111 (*)    BUN <5 (*)    Total Bilirubin 1.3 (*)    All other components within normal limits  CBG MONITORING, ED - Abnormal; Notable for the following components:   Glucose-Capillary 126 (*)    All other components within normal limits  RESP PANEL BY RT-PCR (RSV, FLU A&B, COVID)  RVPGX2  LIPASE, BLOOD  ETHANOL  RAPID URINE DRUG SCREEN, HOSP PERFORMED  URINALYSIS, ROUTINE W REFLEX MICROSCOPIC  I-STAT BETA HCG BLOOD, ED ([facility], [facility], [facility] ONLY)    EKG EKG Interpretation  Date/Time:  Saturday August 22 2022 16:36:41 EST Ventricular Rate:  97 PR Interval:  118 QRS Duration: 80 QT Interval:  374 QTC Calculation: 476 R Axis:   63 Text Interpretation: Sinus rhythm Confirmed by January 01 ([id number]) on 08/22/2022 5:37:47 PM  Radiology CT Head Wo Contrast  Result Date: 08/22/2022 CLINICAL DATA:  Seizure, new-onset, no history of trauma; Polytrauma, blunt; Facial trauma, blunt EXAM: CT HEAD WITHOUT CONTRAST CT MAXILLOFACIAL WITHOUT CONTRAST CT CERVICAL SPINE WITHOUT CONTRAST TECHNIQUE: Multidetector CT imaging of the head, cervical spine, and maxillofacial structures were performed using the standard protocol without intravenous contrast. Multiplanar CT image reconstructions of the cervical spine and maxillofacial structures were also generated. RADIATION DOSE REDUCTION: This exam was performed according to the departmental dose-optimization program which includes automated exposure control, adjustment of the mA and/or kV according to patient size and/or use of iterative reconstruction technique. COMPARISON:  None Available. FINDINGS: CT HEAD FINDINGS Brain: No evidence of large-territorial acute infarction. No parenchymal hemorrhage. No mass lesion. No extra-axial collection. No mass effect or midline shift. No hydrocephalus. Basilar cisterns are patent. Vascular: No hyperdense vessel. Skull: No acute fracture or focal lesion. Other: None. CT MAXILLOFACIAL FINDINGS Osseous: No fracture or mandibular dislocation. No destructive process. Sinuses/Orbits: Paranasal sinuses and mastoid air cells are clear. The orbits are unremarkable. Soft tissues: Negative. CT CERVICAL SPINE FINDINGS Alignment: Normal. Skull base and vertebrae: No acute fracture. No aggressive appearing focal osseous lesion or focal pathologic process. Soft tissues and spinal canal: No prevertebral fluid or swelling. No visible canal hematoma. Upper chest: Unremarkable. Other: None. IMPRESSION: 1.  No acute intracranial abnormality. 2. No acute  displaced facial fracture. 3. No acute displaced fracture or traumatic listhesis of the cervical spine. Electronically Signed   By: Tish Frederickson M.D.   On: 08/22/2022 18:05   CT Cervical Spine Wo Contrast  Result Date: 08/22/2022 CLINICAL DATA:  Seizure, new-onset, no history of trauma; Polytrauma, blunt; Facial trauma, blunt EXAM: CT HEAD WITHOUT CONTRAST CT MAXILLOFACIAL WITHOUT CONTRAST CT CERVICAL SPINE WITHOUT CONTRAST TECHNIQUE: Multidetector CT imaging of the head, cervical spine, and maxillofacial structures were performed using the standard protocol without intravenous contrast. Multiplanar CT image reconstructions of the cervical spine and maxillofacial structures were also generated. RADIATION DOSE REDUCTION: This exam was performed according to the departmental dose-optimization program which includes automated exposure control, adjustment of the mA and/or kV according to patient size and/or use of iterative reconstruction technique. COMPARISON:  None Available. FINDINGS: CT HEAD FINDINGS Brain: No evidence of large-territorial acute infarction. No parenchymal hemorrhage. No mass lesion. No extra-axial collection. No mass effect or midline shift. No hydrocephalus. Basilar cisterns are patent. Vascular: No hyperdense vessel. Skull: No acute fracture or focal lesion. Other: None. CT MAXILLOFACIAL FINDINGS Osseous: No fracture or mandibular dislocation. No destructive process. Sinuses/Orbits: Paranasal sinuses and mastoid air cells are clear. The orbits are unremarkable. Soft tissues: Negative. CT CERVICAL SPINE FINDINGS Alignment: Normal. Skull base and vertebrae: No acute fracture. No aggressive appearing focal osseous lesion or focal pathologic process. Soft tissues and spinal canal: No prevertebral fluid or swelling. No visible canal hematoma. Upper chest: Unremarkable. Other: None. IMPRESSION: 1.  No acute intracranial abnormality. 2. No acute displaced facial fracture. 3. No acute displaced  fracture or traumatic listhesis of the cervical spine. Electronically Signed   By: Tish Frederickson M.D.   On: 08/22/2022 18:05   CT Maxillofacial Wo Contrast  Result Date: 08/22/2022 CLINICAL DATA:  Seizure, new-onset, no history of trauma; Polytrauma, blunt; Facial trauma, blunt EXAM: CT HEAD WITHOUT CONTRAST CT MAXILLOFACIAL WITHOUT CONTRAST CT CERVICAL SPINE WITHOUT CONTRAST TECHNIQUE: Multidetector CT imaging of the head, cervical spine, and maxillofacial structures were performed using the standard protocol without intravenous contrast. Multiplanar CT image reconstructions of the cervical spine and maxillofacial structures were also generated. RADIATION DOSE REDUCTION: This exam was performed  according to the departmental dose-optimization program which includes automated exposure control, adjustment of the mA and/or kV according to patient size and/or use of iterative reconstruction technique. COMPARISON:  None Available. FINDINGS: CT HEAD FINDINGS Brain: No evidence of large-territorial acute infarction. No parenchymal hemorrhage. No mass lesion. No extra-axial collection. No mass effect or midline shift. No hydrocephalus. Basilar cisterns are patent. Vascular: No hyperdense vessel. Skull: No acute fracture or focal lesion. Other: None. CT MAXILLOFACIAL FINDINGS Osseous: No fracture or mandibular dislocation. No destructive process. Sinuses/Orbits: Paranasal sinuses and mastoid air cells are clear. The orbits are unremarkable. Soft tissues: Negative. CT CERVICAL SPINE FINDINGS Alignment: Normal. Skull base and vertebrae: No acute fracture. No aggressive appearing focal osseous lesion or focal pathologic process. Soft tissues and spinal canal: No prevertebral fluid or swelling. No visible canal hematoma. Upper chest: Unremarkable. Other: None. IMPRESSION: 1.  No acute intracranial abnormality. 2. No acute displaced facial fracture. 3. No acute displaced fracture or traumatic listhesis of the cervical  spine. Electronically Signed   By: Tish Frederickson M.D.   On: 08/22/2022 18:05   DG Chest Portable 1 View  Result Date: 08/22/2022 CLINICAL DATA:  Seizure-like activity.  Fall. EXAM: PORTABLE CHEST 1 VIEW COMPARISON:  AP chest 01/19/2021 FINDINGS: Cardiac silhouette and mediastinal contours are within normal limits. The lungs are clear. No pleural effusion or pneumothorax. No acute skeletal abnormality. IMPRESSION: No acute cardiopulmonary disease process. Electronically Signed   By: Neita Garnet M.D.   On: 08/22/2022 17:13    Procedures Procedures    Medications Ordered in ED Medications  LORazepam (ATIVAN) injection 1 mg (1 mg Intravenous Given 08/22/22 1757)  levETIRAcetam (KEPPRA) IVPB 1500 mg/ 100 mL premix (0 mg Intravenous Stopped 08/22/22 1825)    And  levETIRAcetam (KEPPRA) IVPB 500 mg/100 mL premix (0 mg Intravenous Stopped 08/22/22 1910)    ED Course/ Medical Decision Making/ A&P                           Medical Decision Making Amount and/or Complexity of Data Reviewed Labs: ordered. Radiology: ordered.  Risk Prescription drug management. Decision regarding hospitalization.   Andromeda Poppen is here after seizure-like activities.  Maybe 2 episodes today several hours apart.  She arrives unremarkable vitals.  She is back at her baseline.  History of marijuana use and alcohol use but social alcohol use.  Does not admit to any heavy drinking.  Maybe 1-2 alcoholic drinks when she feels like she needs help to sleep.  Last drink was a couple days ago.  She denies any nausea or vomiting.  She had a little bit of headache as she hit her head when she fell.  Sounds like she had 2 episodes that lasted less than a minute maybe 2 minutes.  She had some stiffening of the upper arms.  She has no significant cardiac history or pulmonary history.  She has no seizure history.  Neurologically she is intact.  She has no recent illnesses.  Differential diagnosis is electrolyte abnormality  versus seizure versus cardiac issue or polysubstance issue.  Will get CT scan of head, neck, face, CBC, BMP, ethanol level, UDS, UA U pregnant.  EKG per my review and interpretation shows sinus rhythm.  No ischemic changes.  I talked with Dr. Wilford Corner with neurology and at this time as long she does not have any other seizures and workup is unremarkable she can follow-up outpatient with neurology.  Will need seizure precautions during  that time.  While patient was in CT scan she had another seizure-like episode that self resolved.  She had bit her tongue and had a clenched jaw and stiffened up.  She is postictal on my examination of her.  Overall neurology was reconsulted at that time.  Given concern for may be alcohol withdrawal seizures we will give her 1 mg Ativan and start her on IV Keppra load.  Neurology to consult.  My suspicion is that this is likely polysubstance related.  No history of seizure prior.  She is very well-appearing prior to this seizure activity.  I have no concern for meningitis or other acute process at this time.  Blood work per my review interpretation shows no significant anemia, electrolyte abnormality, kidney injury.  Pregnancy test is negative.  Alcohol level undetectable.  CT scan of the head, neck, face per radiology report with no acute findings.  Will admit patient for further neurology workup.  This chart was dictated using voice recognition software.  Despite best efforts to proofread,  errors can occur which can change the documentation meaning.         Final Clinical Impression(s) / ED Diagnoses Final diagnoses:  Seizure-like activity [facility])    Rx / DC Orders ED Discharge Orders     None         Virgina NorfolkCuratolo, Evan Mackie, DO 08/22/22 1931

## 2022-08-22 NOTE — H&P (Signed)
Date: 08/22/2022               Patient Name:  Marie Myers MRN: [id number]  DOB: 16-Feb-1991 Age / Sex: 31 y.o., female   PCP: Nancie Neas, FNP         Medical Service: Internal Medicine Teaching Service         Attending Physician: Dr. Lottie Mussel, MD    First Contact: Linward Natal, MD      Pager: [phone]      Second Contact: Linwood Dibbles, MD      Pager: PA [phone]           After Hours (After 5p/  First Contact Pager: [phone]  weekends / holidays): Second Contact Pager: [phone]   SUBJECTIVE   Chief Complaint: Seizure   History of Present Illness:  Ms Scheid is a 31 year old female with a past medical history of alcohol use, marijuana use, generalized anxiety disorder, major depressive disorder, hypertension, gastric bypass surgery, and left hip fracture who was brought to the [facility] ED via EMS after experiencing two seizures at work.  According to her husband, the patient awoke around 9:40am on 12-30 and immediately seemed disoriented. She was unable to locate the bathroom then became unresponsive with full-body shaking and eyes rolling back into her head. No bowel or bladder incontinence per the husband's report. Instead of visiting the ED, the patient decided to start working and arrived at her [place] around 1:00-2:00pm. Shortly after arriving, she experienced another seizure and hit her head during the episode. At that point, EMS was called and brought her to the [facility] ED. The patient experienced a third seizure after a head CT, which responded to lorazepam and a levetiracetam infusion. During initial encounter with the patient, she was very lethargic and unable to provide a detailed history.  Her husband reports via phone that the patient has been more stressed than usual lately due to a demanding work schedule. 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 Additionally, her sister  and nephew passed away a few years ago, which remain a source of emotional stress for her. Previously, she regularly saw a psychologist but stopped about one year ago due to lack of insurance coverage. She continues to utilize [employer] as a resource and is currently prescribed multiple psychiatric medications. Her last in-person visit was 6-30, though she remains in close communication with [facility] and her medication doses are adjusted periodically as needed.  About two months ago, the patient ran out of medications and resumed drinking alcohol each night to help her sleep. She has been drinking 1-2 glasses of wine each night in lieu of these medications. Yesterday, 12-29, the patient received refills on her medications and abruptly stopped drinking alcohol. Also per her husband, the patient has never experienced a seizure prior to today.  Patient denies recent illnesses, diarrhea, cough, fever, chills, vomiting, nausea, and pain. She reports a bifrontal headache that started after her second seizure earlier this afternoon.    ED Course: Upon arrival to the ED, vital signs were notable for BP 163/100 and RR 21. Laboratory testing demonstrated Gluc 126, WBC 12.7, MCV 105.8, K 3.3, BUN <5, TBili 1.3, and EtOH <10. Chest radiograph and head CT were both unremarkable. After arrival, patient experienced another seizure that responded appropriately to lorazepam. Intravenous levetiracetam infusion was started.   Meds:  Current Meds  Medication Sig   hydrOXYzine (ATARAX) 25 MG  tablet Take 1 tablet (25 mg total) by mouth 3 (three) times daily as needed. (Patient taking differently: Take 25 mg by mouth 3 (three) times daily as needed for anxiety.)   lisinopril (ZESTRIL) 5 MG tablet Take 1 tablet (5 mg total) by mouth daily.   OLANZapine (ZYPREXA) 15 MG tablet Take 1 tablet (15 mg total) by mouth at bedtime.   ondansetron (ZOFRAN ODT) 4 MG disintegrating tablet Take 1 tablet (4 mg total) by  mouth every 8 (eight) hours as needed for nausea or vomiting.   sertraline (ZOLOFT) 100 MG tablet Take 2 tablets (200 mg total) by mouth daily.   thiamine 100 MG tablet Take 1 tablet (100 mg total) by mouth daily.   traZODone (DESYREL) 100 MG tablet Take 2 tablets (200 mg total) by mouth at bedtime.   vitamin B-12 1000 MCG tablet Take 1 tablet (1,000 mcg total) by mouth daily.   Vitamin D, Ergocalciferol, (DRISDOL) 1.25 MG (50000 UNIT) CAPS capsule Take 50,000 Units by mouth every 7 (seven) days. Every tuesday   VIVITROL 380 MG SUSR Inject 380 mg into the muscle every 28 (twenty-eight) days.    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    OBJECTIVE:   Physical Exam: Blood pressure (!) 154/101, pulse 100, temperature 98.7 F (37.1 C), temperature source Oral, resp. rate (!) 21, height _0  (1.676 m), weight 89.8 kg, SpO2 97 %.   General:      sleepy but arousable to loud verbal stimuli, quickly returns to sleep, lying in bed, cooperative, not in acute distress Skin:       warm and dry, intact without any obvious lesions or scars, no rashes Head:      normocephalic and atraumatic, oral mucosa moist, no lymphadenopathy Eyes:      extraocular  movements intact, pupils round and reactive to light, no periorbital swelling or scleral icterus Lungs:      normal respiratory effort, breathing unlabored, symmetrical chest rise, no crackles or wheezing in upper lung fields Cardiac:      regular rate and rhythm, normal S1 and S2, dorsal pedis pulse intact bilaterally, no pitting edema Abdomen:      soft and non-distended, no tenderness to palpation or guarding Musculoskeletal:  strength 5 /5 with grip and dorsiflexion-plantarflexion bilaterally Neurologic:      oriented to person-place-time and president with slow responses, moving all extremities, sensation to light touch intact across trigeminal nerve distribution and BLEs-BUEs, no gross focal deficits Psychiatric:      sleepy mood with congruent affect, mumbled but intelligible speech   Labs: CBC    Component Value Date/Time   WBC 12.7 (H) 08/22/2022 1704   RBC 3.62 (L) 08/22/2022 1704   HGB 12.4 08/22/2022  1704   HCT 38.3 08/22/2022 1704   PLT 218 08/22/2022 1704   MCV 105.8 (H) 08/22/2022 1704   MCH 34.3 (H) 08/22/2022 1704   MCHC 32.4 08/22/2022 1704   RDW 13.8 08/22/2022 1704   LYMPHSABS 0.4 (L) 08/22/2022 1704   MONOABS 0.7 08/22/2022 1704   EOSABS 0.0 08/22/2022 1704   BASOSABS 0.0 08/22/2022 1704     CMP     Component Value Date/Time   NA 136 08/22/2022 1704   K 3.3 (L) 08/22/2022 1704   CL 102 08/22/2022 1704   CO2 24 08/22/2022 1704   GLUCOSE 111 (H) 08/22/2022 1704   BUN <5 (L) 08/22/2022 1704   CREATININE 0.75 08/22/2022 1704   CALCIUM 8.9 08/22/2022 1704   PROT 6.7 08/22/2022 1704   ALBUMIN 3.7 08/22/2022 1704   AST 27 08/22/2022 1704   ALT 11 08/22/2022 1704   ALKPHOS 75 08/22/2022 1704   BILITOT 1.3 (H) 08/22/2022 1704   GFRNONAA >60 08/22/2022 1704   GFRAA >60 12/27/2019 0810     Imaging:  CT Head Wo Contrast Result Date: 08/22/2022 IMPRESSION: 1.  No acute intracranial abnormality. 2. No acute displaced facial fracture. 3. No acute  displaced fracture or traumatic listhesis of the cervical spine.   CT Cervical Spine Wo Contrast Result Date: 08/22/2022 IMPRESSION: 1.  No acute intracranial abnormality. 2. No acute displaced facial fracture. 3. No acute displaced fracture or traumatic listhesis of the cervical spine.   CT Maxillofacial Wo Contrast Result Date: 08/22/2022 IMPRESSION: 1.  No acute intracranial abnormality. 2. No acute displaced facial fracture. 3. No acute displaced fracture or traumatic listhesis of the cervical spine.   DG Chest Portable 1 View Result Date: 08/22/2022 IMPRESSION: No acute cardiopulmonary disease process.     ECG: My personal interpretation is prolonged QT interval, which is new from prior ECG on 01-19-2021.    ASSESSMENT & PLAN:   Assessment & Plan by Problem: Principal Problem:   Seizure disorder ([place])   Shantaya Bluestone is a 31 y.o. person living with a history of alcohol use, marijuana use, generalized anxiety disorder, major depressive disorder, hypertension, gastric bypass surgery, and left hip fracture who was brought to the [facility] ED via EMS after experiencing two seizures at work, now admitted for diagnostic workup of new-onset seizures.   ---New-onset witnessed seizures Patient experienced two witnessed seizures prior to arrival and another one in the ED. She was given lorzepam and levetiracetam, the seizures have since resolved without recurrence. Laboratory testing was notable only for hypokalemia, all other electrolytes within normal limits. Imaging including chest radiograph and head CT were unremarkable. According to her husband, the patient has never experienced a seizure prior to today. History is notable for multiple centrally-acting medications including olanzapine, which can lower seizure threshold. Of note, the patient abruptly stopped drinking 1-2 glasses of wine per night exactly one day ago. Her husband reports that she has recently been experiencing significant  emotional stress due to a demanding work schedule and two family deaths within the last few years. Seizure etiology is most likely multifactorial in the setting of abrupt alcohol cessation, multiple psychiatric medications, and increased emotional stressors. Primary team will continue monitoring patient for signs of alcohol withdrawal and proceed with more comprehensive diagnostic workup. > Neurology consult, appreciate recommendations > Gabapentin 433m q8 > Acetaminophen [id number] PRN > Protocol CIWA with lorazepam as needed > Electroencephalogram > Brain MRI with contrast  > Check Mg and Phos > Rapid urine drug screen >  Urinalysis > Trend BMP q24 > Trend CBC q24 > Fall and seizure precautions   ---Alcohol use ---Marijuana use Patient has a long history of alcohol and marijuana use. When lacking access to her usual medications, she drinks 1-2 glasses of wine each night before bed to help facilitate sleep. Yesterday 12-29, she received refills for her medications and abruptly stopped drinking alcohol before bed. Upon arrival, vital signs notable for blood pressure elevated to 162/98 and tremors were absent on exam. Blood alcohol level measured in the ED was <10. > Transitions of care consult, appreciate recommendations > Check vitamin B12 level   ---Major depressive disorder with history of suicidal ideation ---Generalized anxiety disorder Patient has an extensive history of major depressive disorder and generalized anxiety disorder. She has been hospitalized twice for suicidal ideation since 12-2020. Previously, she met with a psychologist but stopped about one year ago due to lack of insurance coverage and still follows with behavioral health. At home she takes multiple psychiatric medications including gabapentin, guanfacine, hydroxyzine, olanzapine, sertraline, and trazodone. Although compliant with these medications, she stopped for the past two months until refills were available on  12-29. > Gabapentin 490m q8 > Sertraline 2075mq24 > Trazodone 20023m24 > Olanzapine 30m52m4, starting 12-31 > Hold home guanfacine and hydroxyzine given somnolence   ---Hypokalemia Upon arrival, laboratory testing revealed potassium 3.3. Etiology remains unclear at this time, though possibly secondary to reduced oral intake from deviation in usual schedule. Given only mildly reduction in potassium level, hypokalemia is likely noncontributory to her current seizures. > Trend BMP q24 > Replete potassium as needed   ---Roux-en-Y gastric bypass surgery 2012 Patient underwent Roux-en-Y Gastric bypass surgery in 2012 to help facilitate weight loss. Since then, the patient has been intermittently experiencing gastrointestinal symptoms such as epigastric and abdominal pain that worsen depending on her diet. At home, she takes several vitamin supplements. The patient has denied experiencing any abdominal symptoms throughout current hospitalization.  > Folic acid 1mg 49m >Y70iamine 100mg 23m> Vitamin B12 1000ug q24 > Check vitamin B12 level   ---Hypertension Patient has a history of hypertension managed at home with lisinopril, which she takes regularly. Upon arrival, blood pressure was elevated to 163/100. Laboratory testing revealed potassium level of 3.3. > Lisinopril 5mg q264m ---Left hip fracture post pinning Patient reports sustaining a left hip fracture about nineteen years ago while cheerleading, treated with surgical pinning. 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 > Monitor clinically    Diet: NPO VTE: Enoxaparin IVF: 1/2 NS,75cc/hr Code: Full  Prior to Admission Living Arrangement: Home, living with husband and children Anticipated Discharge Location: Home Barriers to Discharge: medical management, diagnostic workup  Dispo: Admit patient to Observation with expected length of stay less than 2 midnights.  Signed: Vyctoria Dickman,Serita Butchernternal Medicine Resident PGY-1  08/22/2022, 10:31 PM

## 2022-08-22 NOTE — Consult Note (Incomplete)
Neurology Consultation Reason for Consult:  Referring Physician: ***  CC: ***  History is obtained from:***  HPI: Marie Myers is a 31 y.o. female ***   LKW: *** tnk given?: no, *** Premorbid modified rankin scale: *** ICH Score: ***    ROS: A 14 point ROS was performed and is negative except as noted in the HPI. *** Unable to obtain due to altered mental status.   Past Medical History:  Diagnosis Date   Anxiety    Depression    H/O gastric bypass    Insomnia    Marijuana use 01/08/2021   Moderate alcohol use disorder (HCC) 01/08/2021   ***  Family History  Problem Relation Age of Onset   Diabetes Mother    Diabetes Father    ***  Social History:  reports that she has never smoked. She has never used smokeless tobacco. She reports current alcohol use of about 1.0 standard drink of alcohol per week. She reports current drug use. Drug: Marijuana. ***  Exam: Current vital signs: BP (!) 142/89   Pulse 95   Temp 98.7 F (37.1 C) (Oral)   Resp 15   Ht 5\' 6"  (1.676 m)   Wt 89.8 kg   SpO2 99%   BMI 31.96 kg/m  Vital signs in last 24 hours: Temp:  [98.7 F (37.1 C)] 98.7 F (37.1 C) (12/30 1655) Pulse Rate:  [95-100] 95 (12/30 1945) Resp:  [15-21] 15 (12/30 1945) BP: (142-163)/(89-101) 142/89 (12/30 1945) SpO2:  [97 %-100 %] 99 % (12/30 1945) Weight:  [89.8 kg] 89.8 kg (12/30 1656)   Physical Exam  Appears well-developed and well-nourished.   Neuro: Mental Status: Patient is awake, alert, oriented to person, place, month, year, and situation.*** Patient is able to give a clear and coherent history.*** No signs of aphasia or neglect*** Cranial Nerves: II: Visual Fields are full. Pupils are equal, round, and reactive to light.  *** III,IV, VI: EOMI without ptosis or diploplia.  V: Facial sensation is symmetric to temperature VII: Facial movement is symmetric.  VIII: hearing is intact to voice X: Uvula elevates symmetrically XI: Shoulder shrug is  symmetric. XII: tongue is midline without atrophy or fasciculations.  Motor: Tone is normal. Bulk is normal. 5/5 strength was present in all four extremities. *** Sensory: Sensation is symmetric to light touch and temperature in the arms and legs.*** Deep Tendon Reflexes: 2+ and symmetric in the biceps and patellae. *** Plantars: Toes are downgoing bilaterally. *** Cerebellar: FNF and HKS are intact bilaterally***      I have reviewed labs in epic and the results pertinent to this consultation are: ***  I have reviewed the images obtained:***  Impression: ***  Recommendations: 1) ***   06-27-2002, MD [facility] [phone]  If 7pm- 7am, please page neurology on call as listed in AMION.

## 2022-08-22 NOTE — ED Triage Notes (Signed)
New onset seizure. Husband witnessed 1st seizure episode this AM lasting about 2 minutes with symptoms of full body shaking and eye rolling. Pt was at work today when she had 2nd witnessed seizure episode. EMS reports grandmal in nature and lasted about 30 seconds. Post ictal and incontinent upon EMS arrival. Pt was initially confused. Now alert and oriented x 4. Reports drinking 1 glass of wine daily until a few days ago. Recently restarted taking depression meds as well after being off meds for a few months. EMS reports pt fell forward when she had 2nd seizure at work and landed on tile floor.

## 2022-08-23 ENCOUNTER — Observation Stay (HOSPITAL_COMMUNITY): Payer: Medicaid Other

## 2022-08-23 DIAGNOSIS — R569 Unspecified convulsions: Secondary | ICD-10-CM

## 2022-08-23 DIAGNOSIS — Z789 Other specified health status: Secondary | ICD-10-CM

## 2022-08-23 DIAGNOSIS — G40909 Epilepsy, unspecified, not intractable, without status epilepticus: Secondary | ICD-10-CM

## 2022-08-23 LAB — CBC
HCT: 31.8 % — ABNORMAL LOW (ref 36.0–46.0)
Hemoglobin: 10.9 g/dL — ABNORMAL LOW (ref 12.0–15.0)
MCH: 34.9 pg — ABNORMAL HIGH (ref 26.0–34.0)
MCHC: 34.3 g/dL (ref 30.0–36.0)
MCV: 101.9 fL — ABNORMAL HIGH (ref 80.0–100.0)
Platelets: 173 10*3/uL (ref 150–400)
RBC: 3.12 MIL/uL — ABNORMAL LOW (ref 3.87–5.11)
RDW: 13.8 % (ref 11.5–15.5)
WBC: 10.5 10*3/uL (ref 4.0–10.5)
nRBC: 0 % (ref 0.0–0.2)

## 2022-08-23 LAB — BASIC METABOLIC PANEL
Anion gap: 13 (ref 5–15)
BUN: <5 mg/dL — ABNORMAL LOW (ref 6–20)
CO2: 24 mmol/L (ref 22–32)
Calcium: 8.6 mg/dL — ABNORMAL LOW (ref 8.9–10.3)
Chloride: 103 mmol/L (ref 98–111)
Creatinine, Ser: 0.72 mg/dL (ref 0.44–1.00)
GFR, Estimated: >60 mL/min (ref >60)
Glucose, Bld: 79 mg/dL (ref 70–99)
Potassium: 3.2 mmol/L — ABNORMAL LOW (ref 3.5–5.1)
Sodium: 140 mmol/L (ref 135–145)

## 2022-08-23 LAB — VITAMIN B12: Vitamin B-12: 75 pg/mL — ABNORMAL LOW (ref 180–914)

## 2022-08-23 LAB — HIV ANTIBODY (ROUTINE TESTING W REFLEX): HIV Screen 4th Generation wRfx: NONREACTIVE

## 2022-08-23 LAB — MAGNESIUM: Magnesium: 2 mg/dL (ref 1.7–2.4)

## 2022-08-23 MED ORDER — K PHOS MONO-SOD PHOS DI & MONO 155-852-130 MG PO TABS
250.0000 mg | ORAL_TABLET | Freq: Every day | ORAL | Status: AC
  Administered 2022-08-23: 250 mg via ORAL
  Filled 2022-08-23: qty 1

## 2022-08-23 MED ORDER — ACETAMINOPHEN 650 MG RE SUPP: 650.0000 mg | Freq: Four times a day (QID) | RECTAL | Status: DC | PRN

## 2022-08-23 MED ORDER — POTASSIUM CHLORIDE 10 MEQ/100ML IV SOLN
INTRAVENOUS | Status: AC
  Filled 2022-08-23: qty 100

## 2022-08-23 MED ORDER — GABAPENTIN 300 MG PO CAPS: 600.0000 mg | ORAL_CAPSULE | Freq: Three times a day (TID) | ORAL | 2 refills | Status: DC

## 2022-08-23 MED ORDER — POTASSIUM CHLORIDE 10 MEQ/100ML IV SOLN
INTRAVENOUS | Status: AC
  Administered 2022-08-23: 10 meq via INTRAVENOUS
  Filled 2022-08-23: qty 100

## 2022-08-23 MED ORDER — ACETAMINOPHEN 325 MG PO TABS
650.0000 mg | ORAL_TABLET | Freq: Four times a day (QID) | ORAL | Status: DC | PRN
  Administered 2022-08-23: 650 mg via ORAL
  Filled 2022-08-23: qty 2

## 2022-08-23 MED ORDER — GABAPENTIN 300 MG PO CAPS
600.0000 mg | ORAL_CAPSULE | Freq: Three times a day (TID) | ORAL | Status: DC
  Administered 2022-08-23: 600 mg via ORAL
  Filled 2022-08-23: qty 2

## 2022-08-23 NOTE — ED Notes (Signed)
Pt refuses MRI at this time. EEG tech notified OK to return

## 2022-08-23 NOTE — Discharge Instructions (Signed)
SEIZURE PRECAUTIONS °Per [place] DMV statutes, patients with seizures are not allowed to drive until they have been seizure-free for six months. °  °Use caution when using heavy equipment or power tools. Avoid working on ladders or at heights. Take showers instead of baths. Ensure the water temperature is not too high on the home water heater. Do not go swimming alone. Do not lock yourself in a room alone (i.e. bathroom). When caring for infants or small children, sit down when holding, feeding, or changing them to minimize risk of injury to the child in the event you have a seizure. Maintain good sleep hygiene. Avoid alcohol. °  °If patient has another seizure, call 911 and bring them back to the ED if: °A.  The seizure lasts longer than 5 minutes.      °B.  The patient doesn't wake shortly after the seizure or has new problems such as difficulty seeing, speaking or moving following the seizure °C.  The patient was injured during the seizure °D.  The patient has a temperature over 102 F (39C) °E.  The patient vomited during the seizure and now is having trouble breathing ° ° °

## 2022-08-23 NOTE — Procedures (Signed)
Patient Name: Marie Myers  MRN: [id number]  Epilepsy Attending: Charlsie Quest  Referring Physician/Provider: Quincy Simmonds, MD  Date: 08/23/2022 Duration: 26.03 mins  Patient history: 31 year old female with new onset seizures. EEG to evaluate for seizure.  Level of alertness: Awake, asleep  AEDs during EEG study: GBP  Technical aspects: This EEG study was done with scalp electrodes positioned according to the 10-20 International system of electrode placement. Electrical activity was reviewed with band pass filter of 1-70Hz , sensitivity of 7 uV/mm, display speed of 12mm/sec with a 60Hz  notched filter applied as appropriate. EEG data were recorded continuously and digitally stored.  Video monitoring was available and reviewed as appropriate.  Description: The posterior dominant rhythm consists of 8-9 Hz activity of moderate voltage (25-35 uV) seen predominantly in posterior head regions, symmetric and reactive to eye opening and eye closing. Sleep was characterized by vertex waves, sleep spindles (12 to 14 Hz), maximal frontocentral region. Physiologic photic driving was not seen during photic stimulation. Hyperventilation was not performed.     IMPRESSION: This study is within normal limits. No seizures or epileptiform discharges were seen throughout the recording.  A normal interictal EEG does not exclude the diagnosis of epilepsy.  Jerico Grisso 

## 2022-08-23 NOTE — ED Notes (Signed)
Patient and husband came back to pick up golden necklace they left behind in room room 14 when discharged, they denied missing anything else.

## 2022-08-23 NOTE — Progress Notes (Addendum)
EEG complete - results pending 

## 2022-08-23 NOTE — Plan of Care (Addendum)
Inpatient neurology chart follow-up  EEG reviewed-normal MRI brain-terminated due to patient's inability to tolerate.  Of the acquired images, no acute abnormality.  Updated recommendations: - Continue gabapentin 600 3 times daily - Seizure precautions as documented below - Follow-up with outpatient neurology in 8 to 12 weeks.  Please provide ambulatory referral to [facility] or [facility] neurology. Inpatient neurology will be available with questions as needed. Plan was relayed to the primary team physicians via secure chat  -- Milon Dikes, MD Neurologist [facility] Pager: [phone]  SEIZURE PRECAUTIONS Per [facility] statutes, patients with seizures are not allowed to drive until they have been seizure-free for six months.   Use caution when using heavy equipment or power tools. Avoid working on ladders or at heights. Take showers instead of baths. Ensure the water temperature is not too high on the home water heater. Do not go swimming alone. Do not lock yourself in a room alone (i.e. bathroom). When caring for infants or small children, sit down when holding, feeding, or changing them to minimize risk of injury to the child in the event you have a seizure. Maintain good sleep hygiene. Avoid alcohol.    If patient has another seizure, call 911 and bring them back to the ED if: A.  The seizure lasts longer than 5 minutes.      B.  The patient doesn't wake shortly after the seizure or has new problems such as difficulty seeing, speaking or moving following the seizure C.  The patient was injured during the seizure D.  The patient has a temperature over 102 F (39C) E.  The patient vomited during the seizure and now is having trouble breathing

## 2022-08-23 NOTE — ED Notes (Signed)
Transported to MRI

## 2022-08-23 NOTE — Discharge Summary (Signed)
Name: Marie Myers MRN: [id number] DOB: 21-Oct-1990 31 y.o. PCP: Delfino Lovett, FNP  Date of Admission: 08/22/2022  4:19 PM Date of Discharge: 08/23/2022 Attending Physician: Mercie Eon, MD  Discharge Diagnosis: 1. Principal Problem:   Seizure disorder [facility])   Discharge Medications: Allergies as of 08/23/2022       Reactions   Zithromax [azithromycin] Hives        Medication List     TAKE these medications    cyanocobalamin 1000 MCG tablet Take 1 tablet (1,000 mcg total) by mouth daily.   folic acid 1 MG tablet Commonly known as: FOLVITE Take 1 tablet (1 mg total) by mouth daily.   gabapentin 300 MG capsule Commonly known as: NEURONTIN Take 2 capsules (600 mg total) by mouth 3 (three) times daily. What changed:  medication strength how much to take   guanFACINE 1 MG tablet Commonly known as: TENEX Take 1 tablet (1 mg total) by mouth at bedtime.   hydrOXYzine 25 MG tablet Commonly known as: ATARAX Take 1 tablet (25 mg total) by mouth 3 (three) times daily as needed. What changed: reasons to take this   lisinopril 5 MG tablet Commonly known as: ZESTRIL Take 1 tablet (5 mg total) by mouth daily.   multivitamin with minerals Tabs tablet Take 1 tablet by mouth daily.   OLANZapine 15 MG tablet Commonly known as: ZYPREXA Take 1 tablet (15 mg total) by mouth at bedtime.   ondansetron 4 MG disintegrating tablet Commonly known as: Zofran ODT Take 1 tablet (4 mg total) by mouth every 8 (eight) hours as needed for nausea or vomiting.   sertraline 100 MG tablet Commonly known as: ZOLOFT Take 2 tablets (200 mg total) by mouth daily.   sucralfate 1 GM/10ML suspension Commonly known as: Carafate Take 10 mLs (1 g total) by mouth 4 (four) times daily -  with meals and at bedtime.   thiamine 100 MG tablet Commonly known as: VITAMIN B1 Take 1 tablet (100 mg total) by mouth daily.   traZODone 100 MG tablet Commonly known as: DESYREL Take 2 tablets (200  mg total) by mouth at bedtime.   Vitamin D (Ergocalciferol) 1.25 MG (50000 UNIT) Caps capsule Commonly known as: DRISDOL Take 50,000 Units by mouth every 7 (seven) days. Every tuesday   Vivitrol 380 MG Susr Generic drug: Naltrexone Inject 380 mg into the muscle every 28 (twenty-eight) days.        Disposition and follow-up:   Ms.Lind Bifulco was discharged from [facility] in Good condition.  At the hospital follow up visit please address:  1.  Witnessed seizure-like activity: MRI brain without acute process. EEG normal. No seizure activity during admission. Patient to discharge with increased dose of gabapentin at 600 mg three times daily. Please ensure continued absence of seizure activity and that patient follows up with [facility] in ~8 weeks.  Hypertension: Will discharge on Lisinopril 5mg  daily.  2.  Labs / imaging needed at time of follow-up: none  3.  Pending labs/ test needing follow-up: none  Follow-up Appointments:   Hospital Course by problem list: 1. Patient experienced two witnessed seizures prior to arrival and another one in the ED. She was given lorazepam and levetiracetam and seizures resolved without recurrence. Laboratory testing was notable only for hypokalemia, all other electrolytes within normal limits. Imaging including chest radiograph and head CT were unremarkable. According to her husband, the patient has never experienced a seizure prior to this episode. History is notable  for multiple centrally-acting medications including olanzapine, which can lower seizure threshold. Of note, the patient abruptly stopped drinking 1-2 glasses of wine per night exactly one day before admission. Her husband reports that she had recently been experiencing significant emotional stress due to a demanding work schedule and two family deaths within the last few years. Seizure etiology is most likely multifactorial in the setting of abrupt  alcohol cessation, multiple psychiatric medications, and increased emotional stressors vs pseudoseizure. EEG normal. MRI brain limited by motion degradation but normal. Patient will discharge with increased gabapentin dose to 600 mg three times daily and ambulatory referral for neurology follow up in 8 weeks per their recommendation. Seizure precautions provided.  Discharge Exam:   BP 121/82   Pulse 73   Temp 98.4 F (36.9 C) (Oral)   Resp 19   Ht 5\' 6"  (1.676 m)   Wt 89.8 kg   SpO2 97%   BMI 31.96 kg/m  Discharge exam:   Physical Exam Constitutional:      General: She is not in acute distress. Eyes:     Extraocular Movements: Extraocular movements intact.  Cardiovascular:     Rate and Rhythm: Normal rate and regular rhythm.  Pulmonary:     Effort: Pulmonary effort is normal.     Breath sounds: Normal breath sounds.  Musculoskeletal:        General: Normal range of motion.     Cervical back: Neck supple.  Skin:    General: Skin is warm and dry.  Neurological:     General: No focal deficit present.     Mental Status: She is alert and oriented to person, place, and time.     Cranial Nerves: No cranial nerve deficit.  Psychiatric:        Mood and Affect: Mood normal.        Behavior: Behavior normal.      Pertinent Labs, Studies, and Procedures:  EEG adult now  Result Date: 08/23/2022 08/25/2022, MD     08/23/2022  6:58 AM Patient Name: Marie Myers MRN: [facility] Attending: [id number] Referring Physician/Provider: Charlsie Quest, MD Date: 08/23/2022 Duration: 26.03 mins Patient history: 31 year old female with new onset seizures. EEG to evaluate for seizure. Level of alertness: Awake, asleep AEDs during EEG study: GBP Technical aspects: This EEG study was done with scalp electrodes positioned according to the 10-20 International system of electrode placement. Electrical activity was reviewed with band pass filter of 1-70Hz , sensitivity of 7 uV/mm,  display speed of 41mm/sec with a 60Hz  notched filter applied as appropriate. EEG data were recorded continuously and digitally stored.  Video monitoring was available and reviewed as appropriate. Description: The posterior dominant rhythm consists of 8-9 Hz activity of moderate voltage (25-35 uV) seen predominantly in posterior head regions, symmetric and reactive to eye opening and eye closing. Sleep was characterized by vertex waves, sleep spindles (12 to 14 Hz), maximal frontocentral region. Physiologic photic driving was not seen during photic stimulation. Hyperventilation was not performed.   IMPRESSION: This study is within normal limits. No seizures or epileptiform discharges were seen throughout the recording. A normal interictal EEG does not exclude the diagnosis of epilepsy. 31m   MR BRAIN WO CONTRAST  Result Date: 08/23/2022 CLINICAL DATA:  Seizure EXAM: MRI HEAD WITHOUT CONTRAST TECHNIQUE: Multiplanar, multiecho pulse sequences of the brain and surrounding structures were obtained without intravenous contrast. COMPARISON:  None Available. FINDINGS: Motion degraded examination, terminated prior to completion by patient request. There  is no acute infarct. No mass or extra-axial collection. Normal Dock Baccam matter signal and normal CSF spaces. The major flow voids are normal. Trace right mastoid fluid. Paranasal sinuses are clear. Normal orbits. IMPRESSION: Motion degraded examination, terminated prior to completion by patient request. Within this limitation, normal MRI of the brain. Electronically Signed   By: Deatra RobinsonKevin  Herman M.D.   On: 08/23/2022 02:21   CT Head Wo Contrast  Result Date: 08/22/2022 CLINICAL DATA:  Seizure, new-onset, no history of trauma; Polytrauma, blunt; Facial trauma, blunt EXAM: CT HEAD WITHOUT CONTRAST CT MAXILLOFACIAL WITHOUT CONTRAST CT CERVICAL SPINE WITHOUT CONTRAST TECHNIQUE: Multidetector CT imaging of the head, cervical spine, and maxillofacial structures  were performed using the standard protocol without intravenous contrast. Multiplanar CT image reconstructions of the cervical spine and maxillofacial structures were also generated. RADIATION DOSE REDUCTION: This exam was performed according to the departmental dose-optimization program which includes automated exposure control, adjustment of the mA and/or kV according to patient size and/or use of iterative reconstruction technique. COMPARISON:  None Available. FINDINGS: CT HEAD FINDINGS Brain: No evidence of large-territorial acute infarction. No parenchymal hemorrhage. No mass lesion. No extra-axial collection. No mass effect or midline shift. No hydrocephalus. Basilar cisterns are patent. Vascular: No hyperdense vessel. Skull: No acute fracture or focal lesion. Other: None. CT MAXILLOFACIAL FINDINGS Osseous: No fracture or mandibular dislocation. No destructive process. Sinuses/Orbits: Paranasal sinuses and mastoid air cells are clear. The orbits are unremarkable. Soft tissues: Negative. CT CERVICAL SPINE FINDINGS Alignment: Normal. Skull base and vertebrae: No acute fracture. No aggressive appearing focal osseous lesion or focal pathologic process. Soft tissues and spinal canal: No prevertebral fluid or swelling. No visible canal hematoma. Upper chest: Unremarkable. Other: None. IMPRESSION: 1.  No acute intracranial abnormality. 2. No acute displaced facial fracture. 3. No acute displaced fracture or traumatic listhesis of the cervical spine. Electronically Signed   By: Tish FredericksonMorgane  Naveau M.D.   On: 08/22/2022 18:05   CT Cervical Spine Wo Contrast  Result Date: 08/22/2022 CLINICAL DATA:  Seizure, new-onset, no history of trauma; Polytrauma, blunt; Facial trauma, blunt EXAM: CT HEAD WITHOUT CONTRAST CT MAXILLOFACIAL WITHOUT CONTRAST CT CERVICAL SPINE WITHOUT CONTRAST TECHNIQUE: Multidetector CT imaging of the head, cervical spine, and maxillofacial structures were performed using the standard protocol without  intravenous contrast. Multiplanar CT image reconstructions of the cervical spine and maxillofacial structures were also generated. RADIATION DOSE REDUCTION: This exam was performed according to the departmental dose-optimization program which includes automated exposure control, adjustment of the mA and/or kV according to patient size and/or use of iterative reconstruction technique. COMPARISON:  None Available. FINDINGS: CT HEAD FINDINGS Brain: No evidence of large-territorial acute infarction. No parenchymal hemorrhage. No mass lesion. No extra-axial collection. No mass effect or midline shift. No hydrocephalus. Basilar cisterns are patent. Vascular: No hyperdense vessel. Skull: No acute fracture or focal lesion. Other: None. CT MAXILLOFACIAL FINDINGS Osseous: No fracture or mandibular dislocation. No destructive process. Sinuses/Orbits: Paranasal sinuses and mastoid air cells are clear. The orbits are unremarkable. Soft tissues: Negative. CT CERVICAL SPINE FINDINGS Alignment: Normal. Skull base and vertebrae: No acute fracture. No aggressive appearing focal osseous lesion or focal pathologic process. Soft tissues and spinal canal: No prevertebral fluid or swelling. No visible canal hematoma. Upper chest: Unremarkable. Other: None. IMPRESSION: 1.  No acute intracranial abnormality. 2. No acute displaced facial fracture. 3. No acute displaced fracture or traumatic listhesis of the cervical spine. Electronically Signed   By: Tish FredericksonMorgane  Naveau M.D.   On: 08/22/2022 18:05  CT Maxillofacial Wo Contrast  Result Date: 08/22/2022 CLINICAL DATA:  Seizure, new-onset, no history of trauma; Polytrauma, blunt; Facial trauma, blunt EXAM: CT HEAD WITHOUT CONTRAST CT MAXILLOFACIAL WITHOUT CONTRAST CT CERVICAL SPINE WITHOUT CONTRAST TECHNIQUE: Multidetector CT imaging of the head, cervical spine, and maxillofacial structures were performed using the standard protocol without intravenous contrast. Multiplanar CT image  reconstructions of the cervical spine and maxillofacial structures were also generated. RADIATION DOSE REDUCTION: This exam was performed according to the departmental dose-optimization program which includes automated exposure control, adjustment of the mA and/or kV according to patient size and/or use of iterative reconstruction technique. COMPARISON:  None Available. FINDINGS: CT HEAD FINDINGS Brain: No evidence of large-territorial acute infarction. No parenchymal hemorrhage. No mass lesion. No extra-axial collection. No mass effect or midline shift. No hydrocephalus. Basilar cisterns are patent. Vascular: No hyperdense vessel. Skull: No acute fracture or focal lesion. Other: None. CT MAXILLOFACIAL FINDINGS Osseous: No fracture or mandibular dislocation. No destructive process. Sinuses/Orbits: Paranasal sinuses and mastoid air cells are clear. The orbits are unremarkable. Soft tissues: Negative. CT CERVICAL SPINE FINDINGS Alignment: Normal. Skull base and vertebrae: No acute fracture. No aggressive appearing focal osseous lesion or focal pathologic process. Soft tissues and spinal canal: No prevertebral fluid or swelling. No visible canal hematoma. Upper chest: Unremarkable. Other: None. IMPRESSION: 1.  No acute intracranial abnormality. 2. No acute displaced facial fracture. 3. No acute displaced fracture or traumatic listhesis of the cervical spine. Electronically Signed   By: Tish Frederickson M.D.   On: 08/22/2022 18:05   DG Chest Portable 1 View  Result Date: 08/22/2022 CLINICAL DATA:  Seizure-like activity.  Fall. EXAM: PORTABLE CHEST 1 VIEW COMPARISON:  AP chest 01/19/2021 FINDINGS: Cardiac silhouette and mediastinal contours are within normal limits. The lungs are clear. No pleural effusion or pneumothorax. No acute skeletal abnormality. IMPRESSION: No acute cardiopulmonary disease process. Electronically Signed   By: Neita Garnet M.D.   On: 08/22/2022 17:13     Discharge Instructions: Discharge  Instructions     Ambulatory referral to Neurology   Complete by: As directed    An appointment is requested in approximately: 8 weeks   Discharge instructions   Complete by: As directed    Mrs. Finfrock,  It was a pleasure caring for you during your admission here at [facility]. You came in after a seizure-like episode. Your workup was reassuring (including a Brain MRI) and we will discharge you with gabapentin 600 mg three times daily. -neurology should be calling you to schedule a follow up in about 8 weeks -please schedule an appointment with your primary care provider in a week or so -please collect your new gabapentin prescription from [facility] at [place]. Take 600 mg three times daily.       Signed: Adron Bene, MD 08/23/2022, 12:55 PM   Pager: [phone]

## 2022-08-23 NOTE — Progress Notes (Signed)
Patient isn't available at the moment for EEG, per nurse,, will check back later

## 2022-08-25 ENCOUNTER — Telehealth (INDEPENDENT_AMBULATORY_CARE_PROVIDER_SITE_OTHER): Payer: No Payment, Other | Admitting: Psychiatry

## 2022-08-25 DIAGNOSIS — F411 Generalized anxiety disorder: Secondary | ICD-10-CM

## 2022-08-25 DIAGNOSIS — F1094 Alcohol use, unspecified with alcohol-induced mood disorder: Secondary | ICD-10-CM | POA: Diagnosis not present

## 2022-08-25 MED ORDER — SERTRALINE HCL 100 MG PO TABS: 200.0000 mg | ORAL_TABLET | Freq: Every day | ORAL | 2 refills | Status: DC

## 2022-08-25 MED ORDER — TRAZODONE HCL 100 MG PO TABS: 200.0000 mg | ORAL_TABLET | Freq: Every day | ORAL | 2 refills | Status: DC

## 2022-08-25 MED ORDER — HYDROXYZINE HCL 25 MG PO TABS: 25.0000 mg | ORAL_TABLET | Freq: Three times a day (TID) | ORAL | 2 refills | Status: DC | PRN

## 2022-08-25 MED ORDER — OLANZAPINE 15 MG PO TABS: 15.0000 mg | ORAL_TABLET | Freq: Every day | ORAL | 2 refills | Status: DC

## 2022-08-25 NOTE — Progress Notes (Addendum)
BH MD/PA/NP OP Progress Note Virtual Visit via Video Note  I connected with Marie Myers on 08/25/22 at  1:00 PM EST by a video enabled telemedicine application and verified that I am speaking with the correct person using two identifiers.  Location: Patient: Home Provider: Clinic   I discussed the limitations of evaluation and management by telemedicine and the availability of in person appointments. The patient expressed understanding and agreed to proceed.   I discussed the assessment and treatment plan with the patient. The patient was provided an opportunity to ask questions and all were answered. The patient agreed with the plan and demonstrated an understanding of the instructions.   The patient was advised to call back or seek an in-person evaluation if the symptoms worsen or if the condition fails to improve as anticipated.  I provided 20 minutes of non-face-to-face time during this encounter.   Marie Ro, MD   08/25/2022 1:14 PM Marie Myers  MRN:  [id number]  Chief Complaint: Follow up                             Meds refills  HPI:  32 year old female seen today for follow-up psychiatric evaluation.  She has a psychiatric history of SI, alcohol induced mood disorder, alcohol dependence, marijuana use, and depression.  She is currently managed on Zyprexa 15 mg nightly, trazodone 200 mg nightly as needed, gabapentin 600 mg 3 times daily, and Zoloft 200 mg daily.   She reports her medications are somewhat effective in managing her psychiatric conditions.   Pt reports that her mood is "good but could be better". She reports improvement in symptoms of depression, racing thoughts and anxiety.  She reports that recently she had a seizure and was admitted to [facility].  Before this episode, patient never had seizure.  Chart review shows that patient had EEG done and which was negative.  Her gabapentin was increased from 400 mg 3 times daily to 600 mg 3 times daily by neurology.  She has been sleeping and eating well.  Currently, she denies any suicidal ideations, homicidal ideations, auditory and visual hallucinations.  She reports some dissociation episodes where she feels like a dream.  She denies paranoia.  She was having passive SI 2 days ago.  She denies plan or intent.  She contracts for safety at this time.  She denies any medication side effects and has been tolerating it well. She reports no change in her current stressors.  She is currently employed full time as a Production designer, theatre/television/film at [employer] and lives with her 49 year old son and fianc.  She denies any relationship issues with her fianc.  She drinks alcohol 2-3 times in a month.  She last drank 2 glasses of wine 4 to 5 days ago.  She smokes marijuana and vapes daily.  She denies using any other illicit drugs. Patient denies any need for change in medication and medication dosages and wants to continue same meds.  Recommended that per neurology she should continue higher dose of Neurontin.  She denies any other concerns. Patient is alert and oriented x 4,  calm, cooperative, and fully engaged in conversation during the encounter.  Her thought process is linear with coherent speech . She does not appear to be responding to internal/external stimuli . No other concerns noted at this time. Discussed the result of her recent EKG with prolonged QTc interval of 475. Her HR was 97 at the  time of EKG.  Discussed that she should follow-up with PCP for monitoring.  Her PCP is Nancie Neas NP at [facility].  She will reach out to her PCP.  Visit Diagnosis:    ICD-10-CM   1. Generalized anxiety disorder  F41.1 traZODone (DESYREL) 100 MG tablet    sertraline (ZOLOFT) 100 MG tablet    OLANZapine (ZYPREXA) 15 MG tablet    hydrOXYzine (ATARAX) 25 MG tablet    2. Alcohol-induced mood disorder (HCC)  F10.94 traZODone (DESYREL) 100 MG tablet    sertraline (ZOLOFT) 100 MG tablet    OLANZapine (ZYPREXA) 15 MG tablet      Past  Psychiatric History:  SI, alcohol induced mood disorder, alcohol dependence, marijuana use, and depression Past Medical History:  Past Medical History:  Diagnosis Date   Anxiety    Depression    H/O gastric bypass    Insomnia    Marijuana use 01/08/2021   Moderate alcohol use disorder ([place]) 01/08/2021    Past Surgical History:  Procedure Laterality Date   CHOLECYSTECTOMY     GASTRIC BYPASS     GASTRIC BYPASS     LAPAROSCOPY N/A 08/11/2019   Procedure: LAPAROSCOPY DIAGNOSTIC;  Surgeon: Erroll Luna, MD;  Location: [facility] OR;  Service: General;  Laterality: N/A;    Family Psychiatric History: Sister depression, Father alcohol use  Family History:  Family History  Problem Relation Age of Onset   Diabetes Mother    Diabetes Father     Social History:  Social History   Socioeconomic History   Marital status: Single    Spouse name: Not on file   Number of children: Not on file   Years of education: Not on file   Highest education level: Not on file  Occupational History   Not on file  Tobacco Use   Smoking status: Never   Smokeless tobacco: Never  Vaping Use   Vaping Use: Every day   Substances: Nicotine, Flavoring  Substance and Sexual Activity   Alcohol use: Yes    Alcohol/week: 1.0 standard drink of alcohol    Types: 1 Glasses of wine per week   Drug use: Yes    Types: Marijuana   Sexual activity: Not on file  Other Topics Concern   Not on file  Social History Narrative   Not on file   Social Determinants of Health   Financial Resource Strain: Not on file  Food Insecurity: Not on file  Transportation Needs: Not on file  Physical Activity: Not on file  Stress: Not on file  Social Connections: Not on file    Allergies:  Allergies  Allergen Reactions   Zithromax [Azithromycin] Hives    Metabolic Disorder Labs: Lab Results  Component Value Date   HGBA1C 4.5 (L) 01/12/2022   MPG 82.45 01/12/2022   MPG 88.19 01/08/2021   No results found for:  "PROLACTIN" Lab Results  Component Value Date   CHOL 177 01/12/2022   TRIG 42 01/12/2022   HDL 119 01/12/2022   CHOLHDL 1.5 01/12/2022   VLDL 8 01/12/2022   LDLCALC 50 01/12/2022   LDLCALC 38 01/08/2021   Lab Results  Component Value Date   TSH 2.338 01/12/2022   TSH 1.087 01/08/2021    Therapeutic Level Labs: No results found for: "LITHIUM" No results found for: "VALPROATE" No results found for: "CBMZ"  Current Medications: Current Outpatient Medications  Medication Sig Dispense Refill   folic acid (FOLVITE) 1 MG tablet Take 1 tablet (1 mg total) by  mouth daily. (Patient not taking: Reported on 08/22/2022) 30 tablet 0   gabapentin (NEURONTIN) 300 MG capsule Take 2 capsules (600 mg total) by mouth 3 (three) times daily. 180 capsule 2   guanFACINE (TENEX) 1 MG tablet Take 1 tablet (1 mg total) by mouth at bedtime. (Patient not taking: Reported on 08/22/2022) 30 tablet 3   hydrOXYzine (ATARAX) 25 MG tablet Take 1 tablet (25 mg total) by mouth 3 (three) times daily as needed for anxiety. 90 tablet 2   lisinopril (ZESTRIL) 5 MG tablet Take 1 tablet (5 mg total) by mouth daily. 30 tablet 0   Multiple Vitamin (MULTIVITAMIN WITH MINERALS) TABS tablet Take 1 tablet by mouth daily. (Patient not taking: Reported on 01/09/2022)     OLANZapine (ZYPREXA) 15 MG tablet Take 1 tablet (15 mg total) by mouth at bedtime. 30 tablet 2   ondansetron (ZOFRAN ODT) 4 MG disintegrating tablet Take 1 tablet (4 mg total) by mouth every 8 (eight) hours as needed for nausea or vomiting. 10 tablet 0   sertraline (ZOLOFT) 100 MG tablet Take 2 tablets (200 mg total) by mouth daily. 60 tablet 2   sucralfate (CARAFATE) 1 GM/10ML suspension Take 10 mLs (1 g total) by mouth 4 (four) times daily -  with meals and at bedtime. (Patient not taking: Reported on 01/09/2022) 420 mL 0   thiamine 100 MG tablet Take 1 tablet (100 mg total) by mouth daily.     traZODone (DESYREL) 100 MG tablet Take 2 tablets (200 mg total) by  mouth at bedtime. 60 tablet 2   vitamin B-12 1000 MCG tablet Take 1 tablet (1,000 mcg total) by mouth daily. 30 tablet 0   Vitamin D, Ergocalciferol, (DRISDOL) 1.25 MG (50000 UNIT) CAPS capsule Take 50,000 Units by mouth every 7 (seven) days. Every tuesday     VIVITROL 380 MG SUSR Inject 380 mg into the muscle every 28 (twenty-eight) days.     No current facility-administered medications for this visit.     Musculoskeletal: Strength & Muscle Tone:  Unable to assess due to telephone visit Gait & Station:  Unable to assess due to telephone visit Patient leans: N/A  Psychiatric Specialty Exam: Review of Systems  There were no vitals taken for this visit.There is no height or weight on file to calculate BMI.  General Appearance:  casual  Eye Contact:   fair  Speech:  Clear and Coherent and Normal Rate  Volume:  Normal  Mood:  Euthymic  Affect:  Appropriate and Congruent  Thought Process:  Coherent, Goal Directed, and Linear  Orientation:  Full (Time, Place, and Person)  Thought Content: WDL, Logical, and Paranoid Ideation   Suicidal Thoughts:  No  Homicidal Thoughts:  No  Memory:  Immediate;   Good Recent;   Good Remote;   Good  Judgement:  Good  Insight:  Good  Psychomotor Activity:  normal  Concentration:  Concentration: Good and Attention Span: Good  Recall:  Good  Fund of Knowledge: Good  Language: Good  Akathisia:   No  Handed:  Right  AIMS (if indicated): not done  Assets:  Communication Skills Desire for Improvement Financial Resources/Insurance Housing Physical Health Social Support  ADL's:  Intact  Cognition: WNL  Sleep:  Good   Screenings: AIMS    Flowsheet Row [facility]  AIMS Total Score 0 0      AUDIT  Flowsheet Row Video Visit from 05/28/2022 in [facility]  Office Visit from 02/20/2022 in [facility] Admission (Discharged) from 01/10/2022 in [place] [facility] Admission (Discharged) from 01/07/2021 in [place] [facility]  Alcohol Use Disorder Identification Test Final Score (AUDIT) 18 17 8  32      GAD-7    Flowsheet Row Video Visit from 05/28/2022 in [facility] Office Visit from 02/20/2022 in [facility]  Total GAD-7 Score 11 16      PHQ2-9    Flowsheet Row Video Visit from 05/28/2022 in [facility] Office Visit from 02/20/2022 in [place]  PHQ-2 Total Score 5 5  PHQ-9 Total Score 18 15      Flowsheet Row ED from 08/22/2022 in [facility] from 07/14/2022 in [facility] Video Visit from 05/28/2022 in [place] No Risk No Risk Error: Q7 should not be populated when Q6 is No        Assessment and Plan: 32 year old female seen today for follow-up psychiatric evaluation.  She has a psychiatric history of SI, alcohol induced mood disorder, alcohol dependence, marijuana use, and depression.  She is currently managed on Zyprexa 15 mg nightly, trazodone 200 mg nightly as needed, gabapentin 600 mg 3 times daily, and Zoloft 200 mg daily.   She reports her medications are somewhat effective in managing her psychiatric conditions.  Patient recently had a seizure episode where her gabapentin was increased from 400 mg 3 times daily to 600 mg 3 times daily.  EEG was negative.  She will need follow-up with neurology. Her recent EKG shows prolonged QTc interval of 475. Her HR was 97 at the time of EKG.  Recommend  follow-up with PCP for  regular QTc monitoring while on QTc prolonging medications.   1. Alcohol-induced mood disorder  (HCC)  Continue- gabapentin (NEURONTIN) 300  mg TID. Increased by neurology recently due to recent seizure episode.  Patient has refills Continue  OLANZapine (ZYPREXA) 15 MG tablet; Take 1 tablet (15 mg total) by mouth at bedtime.  Dispense: 30 tablet; Refill: 2.   Continue sertraline (ZOLOFT) 100 MG tablet; Take 2 tablets (200 mg total) by mouth daily.  Dispense: 60 tablet; Refill: 2 Continue- traZODone (DESYREL) 100 MG tablet; Take 2 tablets (200 mg total) by mouth at bedtime.  Dispense: 60 tablet; Refill: 2 Recent EKG shows prolonged QTc  of 475. HR 97. Recommend  follow-up with PCP for  regular QTc monitoring  2. Generalized anxiety disorder  Continue- gabapentin (NEURONTIN) 600 MG TID. Increased by neurology recently due to recent seizure episode.  Patient has refills Continue OLANZapine (ZYPREXA) 15 MG tablet; Take 1 tablet (15 mg total) by mouth at bedtime.  Dispense: 30 tablet; Refill: 2 Continue sertraline (ZOLOFT) 100 MG tablet; Take 2 tablets (200 mg total) by mouth daily.  Dispense: 60 tablet; Refill: 2 Continue- traZODone (DESYREL) 100 MG tablet; Take 2 tablets (200 mg total) by mouth at bedtime.  Dispense: 60 tablet; Refill: 2 Continue hydrOXYzine (ATARAX) 25 MG tablet; Take 1 tablet (25 mg total) by mouth 3 (three) times daily as needed.  Dispense: 90 tablet; Refill: 2  Follow-up in 2 months  Collaboration of Care: Collaboration of Care: Other provider involved in patient's care [facility] and recent hospitalization notes  Patient/Guardian was advised Release  of Information must be obtained prior to any record release in order to collaborate their care with an outside provider. Patient/Guardian was advised if they have not already done so to contact the registration department to sign all necessary forms in order for [place] to release information regarding their care.   Consent: Patient/Guardian gives verbal consent for treatment and assignment of benefits for services provided  during this visit. Patient/Guardian expressed understanding and agreed to proceed.   Follow-up in 3 months Marie Ro, MD 08/25/2022, 1:14 PM

## 2022-09-01 ENCOUNTER — Encounter (HOSPITAL_COMMUNITY): Payer: Self-pay | Admitting: Psychiatry

## 2022-09-09 ENCOUNTER — Encounter: Payer: Self-pay | Admitting: Neurology

## 2022-09-09 ENCOUNTER — Ambulatory Visit (INDEPENDENT_AMBULATORY_CARE_PROVIDER_SITE_OTHER): Payer: Medicaid Other | Admitting: Neurology

## 2022-09-09 VITALS — BP 121/78 | HR 65 | Ht 66.0 in | Wt 198.0 lb

## 2022-09-09 DIAGNOSIS — R569 Unspecified convulsions: Secondary | ICD-10-CM | POA: Diagnosis not present

## 2022-09-09 DIAGNOSIS — Z789 Other specified health status: Secondary | ICD-10-CM | POA: Diagnosis not present

## 2022-09-09 DIAGNOSIS — E538 Deficiency of other specified B group vitamins: Secondary | ICD-10-CM | POA: Diagnosis not present

## 2022-09-09 NOTE — Progress Notes (Signed)
[facility]  PATIENT: Marie Myers DOB: Jan 15, 1991  REQUESTING CLINICIAN: Mercie Eon, MD HISTORY FROM: Patient and chart review  REASON FOR VISIT: Seizure   HISTORICAL  CHIEF COMPLAINT:  Chief Complaint  Patient presents with   New Patient (Initial Visit)    Rm 12, with husband and son  NP internal referral for Seizure disorder Reports last sz 2 week     HISTORY OF PRESENT ILLNESS:  This is a 32 year old woman past medical history including hypertension, anxiety, depression, who is presenting after being admitted to the hospital for seizure.  Patient reports on December 30, she did have a total of 3 seizures described as generalized convulsion.  Seizure associated with tongue biting and urinary incontinence.  She presented to the ED, had a MRI and EEG which did not show any abnormality.  Her EEG was possibly related to alcohol withdrawal as she does report drinking and her last drink was 4 days prior to presentation. She was previously on Gabapentin 300 mg TID and plan was for patient to increase it to 600 mg TID.  On today's exam, patient reported she remembers going to work and the next thing that she remembered is waking up in the back of the ambulance.  Still her hospitalization is not clear to her.  Since leaving the hospital she has not had any additional seizures but she continues to drink alcohol daily, she reported her last drink was 2 days ago.  She has not been taking her Gabapentin as directed because per patient, it is not helpful. She does drink hard liquor and also wine.  She reports that she drinks alcohol to help her with sleep.  She does complain of insomnia.  She does not regularly see a psychiatrist or therapist but go to the [facility] to get her meds refilled.   Handedness: Right handed   Onset: August 22 2022  Seizure Type: Generalized convulsion   Current frequency: Only once   Any injuries from seizures: Tongue  biting   Seizure risk factors: Alcohol use disorder otherwise no other reported   Previous ASMs: Gabapentin but for anxiety and mental illness   Currenty ASMs: Gabapentin  600 mg TID but has not been using it (Not helpful per patient)  ASMs side effects: Denies   Brain Images: Normal   Previous EEGs: Normal    OTHER MEDICAL CONDITIONS: Anxiety/Depression, Hypertension, Alcohol use disorder   REVIEW OF SYSTEMS: Full 14 system review of systems performed and negative with exception of: As noted in the HPI   ALLERGIES: Allergies  Allergen Reactions   Zithromax [Azithromycin] Hives    HOME MEDICATIONS: Outpatient Medications Prior to Visit  Medication Sig Dispense Refill   guanFACINE (TENEX) 1 MG tablet Take 1 tablet (1 mg total) by mouth at bedtime. 30 tablet 3   hydrOXYzine (ATARAX) 25 MG tablet Take 1 tablet (25 mg total) by mouth 3 (three) times daily as needed for anxiety. 90 tablet 2   OLANZapine (ZYPREXA) 15 MG tablet Take 1 tablet (15 mg total) by mouth at bedtime. 30 tablet 2   ondansetron (ZOFRAN ODT) 4 MG disintegrating tablet Take 1 tablet (4 mg total) by mouth every 8 (eight) hours as needed for nausea or vomiting. 10 tablet 0   sertraline (ZOLOFT) 100 MG tablet Take 2 tablets (200 mg total) by mouth daily. 60 tablet 2   sucralfate (CARAFATE) 1 GM/10ML suspension Take 10 mLs (1 g total) by mouth 4 (four) times daily -  with meals and at bedtime. 420 mL 0   thiamine 100 MG tablet Take 1 tablet (100 mg total) by mouth daily.     traZODone (DESYREL) 100 MG tablet Take 2 tablets (200 mg total) by mouth at bedtime. 60 tablet 2   vitamin B-12 1000 MCG tablet Take 1 tablet (1,000 mcg total) by mouth daily. 30 tablet 0   Vitamin D, Ergocalciferol, (DRISDOL) 1.25 MG (50000 UNIT) CAPS capsule Take 50,000 Units by mouth every 7 (seven) days. Every tuesday     VIVITROL 380 MG SUSR Inject 380 mg into the muscle every 28 (twenty-eight) days.     lisinopril (ZESTRIL) 5 MG tablet Take  1 tablet (5 mg total) by mouth daily. 30 tablet 0   folic acid (FOLVITE) 1 MG tablet Take 1 tablet (1 mg total) by mouth daily. (Patient not taking: Reported on 08/22/2022) 30 tablet 0   gabapentin (NEURONTIN) 300 MG capsule Take 2 capsules (600 mg total) by mouth 3 (three) times daily. 180 capsule 2   Multiple Vitamin (MULTIVITAMIN WITH MINERALS) TABS tablet Take 1 tablet by mouth daily. (Patient not taking: Reported on 01/09/2022)     No facility-administered medications prior to visit.    PAST MEDICAL HISTORY: Past Medical History:  Diagnosis Date   Anxiety    Depression    H/O gastric bypass    Insomnia    Marijuana use 01/08/2021   Moderate alcohol use disorder (HCC) 01/08/2021    PAST SURGICAL HISTORY: Past Surgical History:  Procedure Laterality Date   CHOLECYSTECTOMY     GASTRIC BYPASS     GASTRIC BYPASS     LAPAROSCOPY N/A 08/11/2019   Procedure: LAPAROSCOPY DIAGNOSTIC;  Surgeon: Harriette Bouillon, MD;  Location: [facility] OR;  Service: General;  Laterality: N/A;    FAMILY HISTORY: Family History  Problem Relation Age of Onset   Diabetes Mother    Diabetes Father     SOCIAL HISTORY: Social History   Socioeconomic History   Marital status: Single    Spouse name: Not on file   Number of children: Not on file   Years of education: Not on file   Highest education level: Not on file  Occupational History   Not on file  Tobacco Use   Smoking status: Never   Smokeless tobacco: Never  Vaping Use   Vaping Use: Every day   Substances: Nicotine, Flavoring  Substance and Sexual Activity   Alcohol use: Yes    Alcohol/week: 1.0 standard drink of alcohol    Types: 1 Glasses of wine per week   Drug use: Yes    Types: Marijuana   Sexual activity: Not on file  Other Topics Concern   Not on file  Social History Narrative   Not on file   Social Determinants of Health   Financial Resource Strain: Not on file  Food Insecurity: Not on file  Transportation Needs: Not on  file  Physical Activity: Not on file  Stress: Not on file  Social Connections: Not on file  Intimate Partner Violence: Not on file    PHYSICAL EXAM  GENERAL EXAM/CONSTITUTIONAL: Vitals:  Vitals:   09/09/22 1045  BP: 121/78  Pulse: 65  Weight: 198 lb (89.8 kg)  Height: 5\' 6"  (1.676 m)   Body mass index is 31.96 kg/m. Wt Readings from Last 3 Encounters:  09/09/22 198 lb (89.8 kg)  08/22/22 198 lb (89.8 kg)  07/14/22 206 lb (93.4 kg)   Patient is in no distress; well developed, nourished  and groomed; neck is supple  EYES: Visual fields full to confrontation, Extraocular movements intacts,  No results found.  MUSCULOSKELETAL: Gait, strength, tone, movements noted in Neurologic exam below  NEUROLOGIC: MENTAL STATUS:      No data to display         awake, alert, oriented to person, place and time recent and remote memory intact normal attention and concentration language fluent, comprehension intact, naming intact fund of knowledge appropriate  CRANIAL NERVE:  2nd, 3rd, 4th, 6th - Visual fields full to confrontation, extraocular muscles intact, no nystagmus 5th - facial sensation symmetric 7th - facial strength symmetric 8th - hearing intact 9th - palate elevates symmetrically, uvula midline 11th - shoulder shrug symmetric 12th - tongue protrusion midline  MOTOR:  normal bulk and tone, full strength in the BUE, BLE  SENSORY:  normal and symmetric to light touch  COORDINATION:  finger-nose-finger, fine finger movements normal  REFLEXES:  deep tendon reflexes present and symmetric  GAIT/STATION:  normal   DIAGNOSTIC DATA (LABS, IMAGING, TESTING) - I reviewed patient records, labs, notes, testing and imaging myself where available.  Lab Results  Component Value Date   WBC 10.5 08/23/2022   HGB 10.9 (L) 08/23/2022   HCT 31.8 (L) 08/23/2022   MCV 101.9 (H) 08/23/2022   PLT 173 08/23/2022      Component Value Date/Time   NA 140 08/23/2022 0421    K 3.2 (L) 08/23/2022 0421   CL 103 08/23/2022 0421   CO2 24 08/23/2022 0421   GLUCOSE 79 08/23/2022 0421   BUN <5 (L) 08/23/2022 0421   CREATININE 0.72 08/23/2022 0421   CALCIUM 8.6 (L) 08/23/2022 0421   PROT 6.7 08/22/2022 1704   ALBUMIN 3.7 08/22/2022 1704   AST 27 08/22/2022 1704   ALT 11 08/22/2022 1704   ALKPHOS 75 08/22/2022 1704   BILITOT 1.3 (H) 08/22/2022 1704   GFRNONAA >60 08/23/2022 0421   GFRAA >60 12/27/2019 0810   Lab Results  Component Value Date   CHOL 177 01/12/2022   HDL 119 01/12/2022   LDLCALC 50 01/12/2022   TRIG 42 01/12/2022   Lab Results  Component Value Date   HGBA1C 4.5 (L) 01/12/2022   Lab Results  Component Value Date   VITAMINB12 75 (L) 08/23/2022   Lab Results  Component Value Date   TSH 2.338 01/12/2022    Routine EEG 08/23/2022 This study is within normal limits. No seizures or epileptiform discharges were seen throughout the recording.   MRI Brain 08/23/2022 Motion degraded examination, terminated prior to completion by patient request. Within this limitation, normal MRI of the brain.    ASSESSMENT AND PLAN  32 y.o. year old female  with medical conditions including alcohol abuse, hypertension, anxiety and depression who is presenting after a seizure on December 30.  She denies any seizure risk factors other than alcohol use.  And her last alcohol use was 4 days prior to the seizures.  Her EEG and MRI within normal limits.  Patient's seizure might be related to alcohol withdrawal.  At this time I have recommended her to decrease her alcohol consumption and if she wants to stop drinking to self admit herself to rehab.  I also advised her to set up care with a psychiatrist and therapist to deal with her anxiety and depression and insomnia.  Advised her to take the gabapentin 600 mg as directed in the hospital. She has not been taking the medication because she said is not helpful.  On  the lab review her B12 was extremely low at 75  advised her to continue with B12 supplement and to recheck it in a couple months and if still low she might need injectiona.  Also advised her to continue following up with her PCP.  Return as needed.  We also discussed driving restriction for neck 6 months.  She voices understanding.   1. Seizures (HCC)   2. Alcohol use   3. Vitamin B12 deficiency     Patient Instructions  Recommend alcohol cessation, recommend patient to self admit herself to rehab  Set up care with psychiatry and therapist  Continue with Gabapentin 600 mg TID Continue with Vitamin B12 supplement, you might need injection if B12 level not normalized.  Discussed driving restriction for the next 6 months, she voices understanding  Follow up as needed or if you have another event   Per [facility] statutes, patients with seizures are not allowed to drive until they have been seizure-free for six months.  Other recommendations include using caution when using heavy equipment or power tools. Avoid working on ladders or at heights. Take showers instead of baths.  Do not swim alone.  Ensure the water temperature is not too high on the home water heater. Do not go swimming alone. Do not lock yourself in a room alone (i.e. bathroom). When caring for infants or small children, sit down when holding, feeding, or changing them to minimize risk of injury to the child in the event you have a seizure. Maintain good sleep hygiene. Avoid alcohol.  Also recommend adequate sleep, hydration, good diet and minimize stress.   During the Seizure  - First, ensure adequate ventilation and place patients on the floor on their left side  Loosen clothing around the neck and ensure the airway is patent. If the patient is clenching the teeth, do not force the mouth open with any object as this can cause severe damage - Remove all items from the surrounding that can be hazardous. The patient may be oblivious to what's happening and may not even  know what he or she is doing. If the patient is confused and wandering, either gently guide him/her away and block access to outside areas - Reassure the individual and be comforting - Call 911. In most cases, the seizure ends before EMS arrives. However, there are cases when seizures may last over 3 to 5 minutes. Or the individual may have developed breathing difficulties or severe injuries. If a pregnant patient or a person with diabetes develops a seizure, it is prudent to call an ambulance. - Finally, if the patient does not regain full consciousness, then call EMS. Most patients will remain confused for about 45 to 90 minutes after a seizure, so you must use judgment in calling for help. - Avoid restraints but make sure the patient is in a bed with padded side rails - Place the individual in a lateral position with the neck slightly flexed; this will help the saliva drain from the mouth and prevent the tongue from falling backward - Remove all nearby furniture and other hazards from the area - Provide verbal assurance as the individual is regaining consciousness - Provide the patient with privacy if possible - Call for help and start treatment as ordered by the caregiver   After the Seizure (Postictal Stage)  After a seizure, most patients experience confusion, fatigue, muscle pain and/or a headache. Thus, one should permit the individual to sleep. For the next few days,  reassurance is essential. Being calm and helping reorient the person is also of importance.  Most seizures are painless and end spontaneously. Seizures are not harmful to others but can lead to complications such as stress on the lungs, brain and the heart. Individuals with prior lung problems may develop labored breathing and respiratory distress.     No orders of the defined types were placed in this encounter.   No orders of the defined types were placed in this encounter.   Return if symptoms worsen or fail to  improve.  I have spent a total of 50 minutes dedicated to this patient today, preparing to see patient, performing a medically appropriate examination and evaluation, ordering tests and/or medications and procedures, and counseling and educating the patient/family/caregiver; independently interpreting result and communicating results to the family/patient/caregiver; and documenting clinical information in the electronic medical record.   Alric Ran, MD 09/09/2022, 1:08 PM  [facility] [address] [place], [place] [address] [phone]

## 2022-09-09 NOTE — Patient Instructions (Addendum)
Recommend alcohol cessation, recommend patient to self admit herself to rehab  Set up care with psychiatry and therapist  Continue with Gabapentin 600 mg TID Continue with Vitamin B12 supplement, you might need injection if B12 level not normalized.  Discussed driving restriction for the next 6 months, she voices understanding  Follow up as needed or if you have another event

## 2022-10-03 ENCOUNTER — Other Ambulatory Visit (HOSPITAL_COMMUNITY): Payer: Medicaid Other

## 2022-10-03 ENCOUNTER — Observation Stay (HOSPITAL_COMMUNITY)
Admission: EM | Admit: 2022-10-03 | Discharge: 2022-10-04 | Disposition: A | Discharge status: Home / Self Care | Payer: Medicaid Other | Attending: Internal Medicine | Admitting: Internal Medicine

## 2022-10-03 ENCOUNTER — Emergency Department (HOSPITAL_COMMUNITY): Payer: Medicaid Other

## 2022-10-03 ENCOUNTER — Observation Stay (HOSPITAL_COMMUNITY): Payer: Medicaid Other

## 2022-10-03 ENCOUNTER — Encounter (HOSPITAL_COMMUNITY): Payer: Self-pay

## 2022-10-03 ENCOUNTER — Other Ambulatory Visit: Payer: Self-pay

## 2022-10-03 DIAGNOSIS — R569 Unspecified convulsions: Secondary | ICD-10-CM | POA: Diagnosis not present

## 2022-10-03 DIAGNOSIS — S01112A Laceration without foreign body of left eyelid and periocular area, initial encounter: Secondary | ICD-10-CM | POA: Diagnosis not present

## 2022-10-03 DIAGNOSIS — X58XXXA Exposure to other specified factors, initial encounter: Secondary | ICD-10-CM | POA: Insufficient documentation

## 2022-10-03 DIAGNOSIS — F419 Anxiety disorder, unspecified: Secondary | ICD-10-CM | POA: Diagnosis not present

## 2022-10-03 DIAGNOSIS — F411 Generalized anxiety disorder: Secondary | ICD-10-CM

## 2022-10-03 DIAGNOSIS — G4700 Insomnia, unspecified: Secondary | ICD-10-CM

## 2022-10-03 DIAGNOSIS — F1094 Alcohol use, unspecified with alcohol-induced mood disorder: Secondary | ICD-10-CM

## 2022-10-03 DIAGNOSIS — D72829 Elevated white blood cell count, unspecified: Secondary | ICD-10-CM | POA: Diagnosis not present

## 2022-10-03 DIAGNOSIS — Z79899 Other long term (current) drug therapy: Secondary | ICD-10-CM | POA: Insufficient documentation

## 2022-10-03 DIAGNOSIS — F1090 Alcohol use, unspecified, uncomplicated: Secondary | ICD-10-CM | POA: Diagnosis present

## 2022-10-03 DIAGNOSIS — Z789 Other specified health status: Secondary | ICD-10-CM | POA: Diagnosis not present

## 2022-10-03 LAB — COMPREHENSIVE METABOLIC PANEL
ALT: 12 U/L (ref 0–44)
AST: 26 U/L (ref 15–41)
Albumin: 3.6 g/dL (ref 3.5–5.0)
Alkaline Phosphatase: 61 U/L (ref 38–126)
Anion gap: 13 (ref 5–15)
BUN: <5 mg/dL — ABNORMAL LOW (ref 6–20)
CO2: 23 mmol/L (ref 22–32)
Calcium: 8.8 mg/dL — ABNORMAL LOW (ref 8.9–10.3)
Chloride: 99 mmol/L (ref 98–111)
Creatinine, Ser: 0.79 mg/dL (ref 0.44–1.00)
GFR, Estimated: >60 mL/min (ref >60)
Glucose, Bld: 136 mg/dL — ABNORMAL HIGH (ref 70–99)
Potassium: 3.7 mmol/L (ref 3.5–5.1)
Sodium: 135 mmol/L (ref 135–145)
Total Bilirubin: 1.1 mg/dL (ref 0.3–1.2)
Total Protein: 6.4 g/dL — ABNORMAL LOW (ref 6.5–8.1)

## 2022-10-03 LAB — CBC WITH DIFFERENTIAL/PLATELET
Abs Immature Granulocytes: 0.04 10*3/uL (ref 0.00–0.07)
Basophils Absolute: 0.1 10*3/uL (ref 0.0–0.1)
Basophils Relative: 0 %
Eosinophils Absolute: 0 10*3/uL (ref 0.0–0.5)
Eosinophils Relative: 0 %
HCT: 36 % (ref 36.0–46.0)
Hemoglobin: 12 g/dL (ref 12.0–15.0)
Immature Granulocytes: 0 %
Lymphocytes Relative: 3 %
Lymphs Abs: 0.4 10*3/uL — ABNORMAL LOW (ref 0.7–4.0)
MCH: 34.9 pg — ABNORMAL HIGH (ref 26.0–34.0)
MCHC: 33.3 g/dL (ref 30.0–36.0)
MCV: 104.7 fL — ABNORMAL HIGH (ref 80.0–100.0)
Monocytes Absolute: 0.7 10*3/uL (ref 0.1–1.0)
Monocytes Relative: 5 %
Neutro Abs: 13.7 10*3/uL — ABNORMAL HIGH (ref 1.7–7.7)
Neutrophils Relative %: 92 %
Platelets: 369 10*3/uL (ref 150–400)
RBC: 3.44 MIL/uL — ABNORMAL LOW (ref 3.87–5.11)
RDW: 15.3 % (ref 11.5–15.5)
WBC: 14.9 10*3/uL — ABNORMAL HIGH (ref 4.0–10.5)
nRBC: 0 % (ref 0.0–0.2)

## 2022-10-03 LAB — ETHANOL: Alcohol, Ethyl (B): <10 mg/dL (ref <10)

## 2022-10-03 LAB — I-STAT BETA HCG BLOOD, ED (MC, WL, AP ONLY): I-stat hCG, quantitative: <5 m[IU]/mL (ref <5)

## 2022-10-03 LAB — MAGNESIUM: Magnesium: 1.5 mg/dL — ABNORMAL LOW (ref 1.7–2.4)

## 2022-10-03 LAB — CBG MONITORING, ED
Glucose-Capillary: 120 mg/dL — ABNORMAL HIGH (ref 70–99)
Glucose-Capillary: 114 mg/dL — ABNORMAL HIGH (ref 70–99)

## 2022-10-03 MED ORDER — TRAZODONE HCL 50 MG PO TABS: 200.0000 mg | ORAL_TABLET | Freq: Every day | ORAL | Status: DC

## 2022-10-03 MED ORDER — MAGNESIUM SULFATE IN D5W 1-5 GM/100ML-% IV SOLN
1.0000 g | Freq: Once | INTRAVENOUS | Status: DC
  Filled 2022-10-03: qty 100

## 2022-10-03 MED ORDER — ENOXAPARIN SODIUM 40 MG/0.4ML IJ SOSY
40.0000 mg | PREFILLED_SYRINGE | INTRAMUSCULAR | Status: DC
  Administered 2022-10-04: 40 mg via SUBCUTANEOUS
  Filled 2022-10-03: qty 0.4

## 2022-10-03 MED ORDER — ONDANSETRON HCL 4 MG PO TABS: 4.0000 mg | ORAL_TABLET | Freq: Four times a day (QID) | ORAL | Status: DC | PRN

## 2022-10-03 MED ORDER — KETOROLAC TROMETHAMINE 30 MG/ML IJ SOLN
30.0000 mg | Freq: Once | INTRAMUSCULAR | Status: AC
  Administered 2022-10-03: 30 mg via INTRAVENOUS
  Filled 2022-10-03: qty 1

## 2022-10-03 MED ORDER — MAGNESIUM SULFATE 2 GM/50ML IV SOLN
2.0000 g | Freq: Once | INTRAVENOUS | Status: AC
  Administered 2022-10-03: 2 g via INTRAVENOUS
  Filled 2022-10-03: qty 50

## 2022-10-03 MED ORDER — ACETAMINOPHEN 325 MG PO TABS: 650.0000 mg | ORAL_TABLET | Freq: Four times a day (QID) | ORAL | Status: DC | PRN

## 2022-10-03 MED ORDER — SODIUM CHLORIDE 0.9 % IV SOLN: 3000.0000 mg | Freq: Once | INTRAVENOUS | Status: DC

## 2022-10-03 MED ORDER — SODIUM CHLORIDE 0.9 % IV SOLN
750.0000 mg | Freq: Two times a day (BID) | INTRAVENOUS | Status: DC
  Administered 2022-10-04: 750 mg via INTRAVENOUS
  Filled 2022-10-03 (×2): qty 7.5

## 2022-10-03 MED ORDER — LORAZEPAM 1 MG PO TABS: 0.0000 mg | ORAL_TABLET | Freq: Two times a day (BID) | ORAL | Status: DC

## 2022-10-03 MED ORDER — FOLIC ACID 1 MG PO TABS
1.0000 mg | ORAL_TABLET | Freq: Every day | ORAL | Status: DC
  Administered 2022-10-04: 1 mg via ORAL
  Filled 2022-10-03 (×2): qty 1

## 2022-10-03 MED ORDER — LEVETIRACETAM IN NACL 500 MG/100ML IV SOLN: 500.0000 mg | Freq: Two times a day (BID) | INTRAVENOUS | Status: DC

## 2022-10-03 MED ORDER — LIDOCAINE HCL (PF) 1 % IJ SOLN
5.0000 mL | Freq: Once | INTRAMUSCULAR | Status: AC
  Administered 2022-10-03: 5 mL
  Filled 2022-10-03: qty 5

## 2022-10-03 MED ORDER — ACETAMINOPHEN 650 MG RE SUPP: 650.0000 mg | Freq: Four times a day (QID) | RECTAL | Status: DC | PRN

## 2022-10-03 MED ORDER — SODIUM CHLORIDE 0.9 % IV BOLUS
1000.0000 mL | Freq: Once | INTRAVENOUS | Status: AC
  Administered 2022-10-03: 1000 mL via INTRAVENOUS

## 2022-10-03 MED ORDER — SERTRALINE HCL 100 MG PO TABS: 200.0000 mg | ORAL_TABLET | Freq: Every day | ORAL | Status: DC

## 2022-10-03 MED ORDER — ONDANSETRON HCL 4 MG/2ML IJ SOLN: 4.0000 mg | Freq: Four times a day (QID) | INTRAMUSCULAR | Status: DC | PRN

## 2022-10-03 MED ORDER — LORAZEPAM 1 MG PO TABS: 1.0000 mg | ORAL_TABLET | ORAL | Status: DC | PRN

## 2022-10-03 MED ORDER — LIDOCAINE-EPINEPHRINE-TETRACAINE (LET) TOPICAL GEL
3.0000 mL | Freq: Once | TOPICAL | Status: AC
  Administered 2022-10-03: 3 mL via TOPICAL
  Filled 2022-10-03: qty 3

## 2022-10-03 MED ORDER — HYDROXYZINE HCL 25 MG PO TABS: 25.0000 mg | ORAL_TABLET | Freq: Three times a day (TID) | ORAL | Status: DC | PRN

## 2022-10-03 MED ORDER — ADULT MULTIVITAMIN W/MINERALS CH
1.0000 | ORAL_TABLET | Freq: Every day | ORAL | Status: DC
  Administered 2022-10-04: 1 via ORAL
  Filled 2022-10-03: qty 1

## 2022-10-03 MED ORDER — LORAZEPAM 1 MG PO TABS: 0.0000 mg | ORAL_TABLET | Freq: Four times a day (QID) | ORAL | Status: DC

## 2022-10-03 MED ORDER — SODIUM CHLORIDE 0.9% FLUSH
3.0000 mL | Freq: Two times a day (BID) | INTRAVENOUS | Status: DC
  Administered 2022-10-04 (×2): 3 mL via INTRAVENOUS

## 2022-10-03 MED ORDER — THIAMINE MONONITRATE 100 MG PO TABS
100.0000 mg | ORAL_TABLET | Freq: Every day | ORAL | Status: DC
  Administered 2022-10-04: 100 mg via ORAL
  Filled 2022-10-03: qty 1

## 2022-10-03 MED ORDER — LORAZEPAM 2 MG/ML IJ SOLN: 1.0000 mg | INTRAMUSCULAR | Status: DC | PRN

## 2022-10-03 MED ORDER — LEVETIRACETAM IN NACL 1500 MG/100ML IV SOLN
1500.0000 mg | Freq: Once | INTRAVENOUS | Status: AC
  Administered 2022-10-03: 1500 mg via INTRAVENOUS
  Filled 2022-10-03: qty 100

## 2022-10-03 MED ORDER — MIDAZOLAM HCL 2 MG/2ML IJ SOLN
2.0000 mg | Freq: Once | INTRAMUSCULAR | Status: AC
  Administered 2022-10-04: 2 mg via INTRAVENOUS
  Filled 2022-10-03: qty 2

## 2022-10-03 MED ORDER — THIAMINE HCL 100 MG/ML IJ SOLN: 100.0000 mg | Freq: Every day | INTRAMUSCULAR | Status: DC

## 2022-10-03 MED ORDER — GABAPENTIN 300 MG PO CAPS
600.0000 mg | ORAL_CAPSULE | Freq: Once | ORAL | Status: AC
  Administered 2022-10-03: 600 mg via ORAL
  Filled 2022-10-03: qty 2

## 2022-10-03 MED ORDER — POLYETHYLENE GLYCOL 3350 17 G PO PACK: 17.0000 g | PACK | Freq: Every day | ORAL | Status: DC | PRN

## 2022-10-03 MED ORDER — LORAZEPAM 2 MG/ML IJ SOLN
1.0000 mg | Freq: Once | INTRAMUSCULAR | Status: AC
  Administered 2022-10-03: 1 mg via INTRAVENOUS
  Filled 2022-10-03: qty 1

## 2022-10-03 MED ORDER — OLANZAPINE 10 MG PO TABS: 15.0000 mg | ORAL_TABLET | Freq: Every day | ORAL | Status: DC

## 2022-10-03 MED ORDER — GABAPENTIN 300 MG PO CAPS
600.0000 mg | ORAL_CAPSULE | Freq: Three times a day (TID) | ORAL | Status: DC
  Administered 2022-10-04: 600 mg via ORAL
  Filled 2022-10-03: qty 2

## 2022-10-03 MED ORDER — METOCLOPRAMIDE HCL 5 MG/ML IJ SOLN
10.0000 mg | Freq: Once | INTRAMUSCULAR | Status: AC
  Administered 2022-10-03: 10 mg via INTRAVENOUS
  Filled 2022-10-03: qty 2

## 2022-10-03 NOTE — H&P (Signed)
History and Physical    Tahlya Ast [id number] DOB: 21-Nov-1990 DOA: 10/03/2022  PCP: Nancie Neas, FNP   Patient coming from: Home   Chief Complaint: Seizures   HPI: Marie Myers is a 32 y.o. female with medical history significant for anxiety, insomnia, alcohol abuse, and prior seizure who presents to the emergency department after having 2 witnessed seizures at home.  Patient had a witnessed seizure-like episode at approximately 7 AM, and then a second episode at roughly 11:30 AM.  Both lasted less than 2 or 3 minutes and were followed by roughly 30 minutes of confusion before returning to baseline.  She bit her lip with 1 of these episodes.  She had a first seizure in December 2023, was admitted to the hospital and had unremarkable MRI brain and EEG.  These were felt to possibly be related to alcohol misuse.  She followed up with neurology in the outpatient setting, was advised to abstain from alcohol, follow-up with psychiatry, and continue gabapentin 600 mg 3 times daily.  Patient's last drink was reportedly 2 to 3 days ago.  She reported drinking 2 to 3 glasses of wine at night (per prior psychiatry documentation, these are 16 ounce glasses of wine).  ED Course: Upon arrival to the ED, patient is found to be afebrile with mild tachycardia and stable blood pressure.  Head CT was negative for acute intracranial abnormality.  Labs notable for undetectable ethanol, magnesium 1.5, WBC 14,900, and MCV 104.7.  She had a seizure in the emergency department which resulted in her falling out of bed onto the floor and suffering a laceration near her left eye.  Head CT was performed after this event.  Neurology evaluated the patient in the emergency department and recommended admission if she did return to baseline within an hour from her last seizure.  She was given 1500 mg of IV Keppra, 1 L of saline, 2 g IV magnesium, Reglan, Ativan, Toradol, and gabapentin.  Review of Systems:  All  other systems reviewed and apart from HPI, are negative.  Past Medical History:  Diagnosis Date   Anxiety    Depression    H/O gastric bypass    Insomnia    Marijuana use 01/08/2021   Moderate alcohol use disorder ([place]) 01/08/2021    Past Surgical History:  Procedure Laterality Date   CHOLECYSTECTOMY     GASTRIC BYPASS     GASTRIC BYPASS     LAPAROSCOPY N/A 08/11/2019   Procedure: LAPAROSCOPY DIAGNOSTIC;  Surgeon: Erroll Luna, MD;  Location: [facility];  Service: General;  Laterality: N/A;    Social History:   reports that she has never smoked. She has never used smokeless tobacco. She reports current alcohol use of about 1.0 standard drink of alcohol per week. She reports current drug use. Drug: Marijuana.  Allergies  Allergen Reactions   Zithromax [Azithromycin] Hives    Family History  Problem Relation Age of Onset   Diabetes Mother    Diabetes Father      Prior to Admission medications   Medication Sig Start Date End Date Taking? Authorizing Provider  guanFACINE (TENEX) 1 MG tablet Take 1 tablet (1 mg total) by mouth at bedtime. 05/28/22   Salley Slaughter, NP  hydrOXYzine (ATARAX) 25 MG tablet Take 1 tablet (25 mg total) by mouth 3 (three) times daily as needed for anxiety. 08/25/22   Armando Reichert, MD  lisinopril (ZESTRIL) 5 MG tablet Take 1 tablet (5 mg total) by mouth daily. 01/14/22 08/22/22  Massengill, Ovid Curd, MD  OLANZapine (ZYPREXA) 15 MG tablet Take 1 tablet (15 mg total) by mouth at bedtime. 08/25/22   Armando Reichert, MD  ondansetron (ZOFRAN ODT) 4 MG disintegrating tablet Take 1 tablet (4 mg total) by mouth every 8 (eight) hours as needed for nausea or vomiting. 01/19/21   Caccavale, Sophia, PA-C  sertraline (ZOLOFT) 100 MG tablet Take 2 tablets (200 mg total) by mouth daily. 08/25/22   Armando Reichert, MD  sucralfate (CARAFATE) 1 GM/10ML suspension Take 10 mLs (1 g total) by mouth 4 (four) times daily -  with meals and at bedtime. 01/19/21   Caccavale, Sophia, PA-C   thiamine 100 MG tablet Take 1 tablet (100 mg total) by mouth daily. 01/14/21   Ethelene Hal, NP  traZODone (DESYREL) 100 MG tablet Take 2 tablets (200 mg total) by mouth at bedtime. 08/25/22   Armando Reichert, MD  vitamin B-12 1000 MCG tablet Take 1 tablet (1,000 mcg total) by mouth daily. 01/14/21   Ethelene Hal, NP  Vitamin D, Ergocalciferol, (DRISDOL) 1.25 MG (50000 UNIT) CAPS capsule Take 50,000 Units by mouth every 7 (seven) days. Every tuesday    [provider]  VIVITROL 380 MG SUSR Inject 380 mg into the muscle every 28 (twenty-eight) days. 08/21/22   [provider]    Physical Exam: Vitals:   10/03/22 1715 10/03/22 1800 10/03/22 1815 10/03/22 1845  BP: (!) 129/90 122/81  110/67  Pulse: 86 91  85  Resp: 19 16  15  $ Temp:   98.4 F (36.9 C)   TempSrc:   Oral   SpO2: 100% 96%  98%  Weight:      Height:         Constitutional: NAD, no pallor or diaphoresis  Eyes: PERTLA, lids and conjunctivae normal ENMT: Mucous membranes are moist. Posterior pharynx clear of any exudate or lesions.   Neck: supple, no masses  Respiratory: no wheezing, no crackles. No accessory muscle use.  Cardiovascular: S1 & S2 heard, regular rate and rhythm. No extremity edema.   Abdomen: No distension, no tenderness, soft. Bowel sounds active.  Musculoskeletal: no clubbing / cyanosis. No joint deformity upper and lower extremities.   Skin: no significant rashes, lesions, ulcers. Warm, dry, well-perfused. Neurologic: CN 2-12 grossly intact. Moving all extremities. Somnolent, wakes briefly to voice but not answering questions.    Labs and Imaging on Admission: I have personally reviewed following labs and imaging studies  CBC: Recent Labs  Lab 10/03/22 1314  WBC 14.9*  NEUTROABS 13.7*  HGB 12.0  HCT 36.0  MCV 104.7*  PLT 0000000   Basic Metabolic Panel: Recent Labs  Lab 10/03/22 1314  NA 135  K 3.7  CL 99  CO2 23  GLUCOSE 136*  BUN <5*  CREATININE 0.79   CALCIUM 8.8*  MG 1.5*   GFR: Estimated Creatinine Clearance: 115 mL/min (by C-G formula based on SCr of 0.79 mg/dL). Liver Function Tests: Recent Labs  Lab 10/03/22 1314  AST 26  ALT 12  ALKPHOS 61  BILITOT 1.1  PROT 6.4*  ALBUMIN 3.6   No results for input(s): "LIPASE", "AMYLASE" in the last 168 hours. No results for input(s): "AMMONIA" in the last 168 hours. Coagulation Profile: No results for input(s): "INR", "PROTIME" in the last 168 hours. Cardiac Enzymes: No results for input(s): "CKTOTAL", "CKMB", "CKMBINDEX", "TROPONINI" in the last 168 hours. BNP (last 3 results) No results for input(s): "PROBNP" in the last 8760 hours. HbA1C: No results for input(s): "HGBA1C"  in the last 72 hours. CBG: Recent Labs  Lab 10/03/22 1445 10/03/22 1941  GLUCAP 120* 114*   Lipid Profile: No results for input(s): "CHOL", "HDL", "LDLCALC", "TRIG", "CHOLHDL", "LDLDIRECT" in the last 72 hours. Thyroid Function Tests: No results for input(s): "TSH", "T4TOTAL", "FREET4", "T3FREE", "THYROIDAB" in the last 72 hours. Anemia Panel: No results for input(s): "VITAMINB12", "FOLATE", "FERRITIN", "TIBC", "IRON", "RETICCTPCT" in the last 72 hours. Urine analysis:    Component Value Date/Time   COLORURINE AMBER (A) 01/12/2022 1747   APPEARANCEUR HAZY (A) 01/12/2022 1747   LABSPEC 1.018 01/12/2022 1747   PHURINE 5.0 01/12/2022 1747   GLUCOSEU NEGATIVE 01/12/2022 1747   HGBUR NEGATIVE 01/12/2022 1747   BILIRUBINUR NEGATIVE 01/12/2022 1747   KETONESUR 5 (A) 01/12/2022 1747   PROTEINUR NEGATIVE 01/12/2022 1747   NITRITE NEGATIVE 01/12/2022 1747   LEUKOCYTESUR NEGATIVE 01/12/2022 1747   Sepsis Labs: @LABRCNTIP$ (procalcitonin:4,lacticidven:4) )No results found for this or any previous visit (from the past 240 hour(s)).   Radiological Exams on Admission: CT Head Wo Contrast  Result Date: 10/03/2022 CLINICAL DATA:  [place] out of bed.  Blunt head trauma.  Headache. EXAM: CT HEAD WITHOUT CONTRAST  TECHNIQUE: Contiguous axial images were obtained from the base of the skull through the vertex without intravenous contrast. RADIATION DOSE REDUCTION: This exam was performed according to the departmental dose-optimization program which includes automated exposure control, adjustment of the mA and/or kV according to patient size and/or use of iterative reconstruction technique. COMPARISON:  08/22/2022 FINDINGS: Brain: No evidence of intracranial hemorrhage, acute infarction, hydrocephalus, extra-axial collection, or mass lesion/mass effect. Vascular:  No hyperdense vessel or other acute findings. Skull: No evidence of fracture or other significant bone abnormality. Sinuses/Orbits:  No acute findings. Other: None. IMPRESSION: Negative noncontrast head CT. Electronically Signed   By: Marlaine Hind M.D.   On: 10/03/2022 17:02    EKG: Independently reviewed. Sinus tachycardia, rate 113.   Assessment/Plan   1. Seizures - Appreciate neurology evaluation and recommendations  - She was given 1500 mg IV Keppra and 1 mg IV Ativan in ED  - Continue seizure precautions, continue gabapentin, check EEG, correct hypomagnesemia    2. Anxiety, insomnia  - Continue gabapentin as advised by neurology but hold other potentially sedating medications for now until she is more alert    3. ?alcohol dependence  - Pt somnolent and not participating in interview at time of admission; she had denied excessive alcohol use to ED physician but has hx of alcohol abuse documented by psychiatry previously  - Monitor with CIWA for now, use Ativan if needed, and supplement vitamins    4. Leukocytosis  - WBC is 14,900 on admission without fever or apparent infection  - Likely reactive, monitor for infectious s/s     DVT prophylaxis: Lovenox  Code Status: Full  Level of Care: Level of care: Telemetry Medical Family Communication: Significant other updated in ED  Disposition Plan:  Patient is from: home  Anticipated d/c is to:  Home  Anticipated d/c date is: 10/04/22  Patient currently: Pending return to baseline, EEG  Consults called: neurology  Admission status: Observation     Vianne Bulls, MD [facility]  10/03/2022, 8:36 PM

## 2022-10-03 NOTE — Discharge Instructions (Addendum)
We gave you medications for headache and also for low magnesium in the ER today.  Our neurologist recommended that you take the gabapentin medication which was prescribed to you regularly because this can help with preventing seizures.  It is extremely important that you not drink any alcohol.  Even a single drink of alcohol can trigger your seizure episodes.  Likewise, you should avoid marijuana or any other type of recreational drugs which can also trigger seizures.  You should not drive for the next 6 months at a minimum, until you are cleared to return to driving by a neurologist.  If you have a seizure while driving, this could be life-threatening and dangerous to yourself or another person.

## 2022-10-03 NOTE — Progress Notes (Signed)
Zyrtec at HS. Doesn't take any Vitamins listed on home med sheet.  Pin left hip r/t dislocated joint.  Wine 2 cups (regular cups) daily but hasn't drank in the last 3 days. Pot daily smoked last yesterday. Vapes but last time was 4 days ago.  Wears glasses. Menses was 2 weeks ago. Gastric Bypass 8 yrs ago.

## 2022-10-03 NOTE — ED Notes (Signed)
Patient transported to CT 

## 2022-10-03 NOTE — ED Notes (Signed)
ED TO INPATIENT HANDOFF REPORT  ED Nurse Name and Phone #: [phone] [name] Name/Age/Gender Marie Myers 32 y.o. female Room/Bed: 031C/031C  Code Status   Code Status: Full Code  Home/SNF/Other Home Patient oriented to: self, place, time, and situation Is this baseline? Yes   Triage Complete: Triage complete  Chief Complaint Seizures [facility]) [R56.9]  Triage Note Reports had two seizures today.  Reports first one last approx 45 secs and bit her lip.  Patient reports last drink was 2-3 days ago.  BF reports first seizure was mostly her upper body but 2nd was full body shaking.    Allergies Allergies  Allergen Reactions   Zithromax [Azithromycin] Hives    Level of Care/Admitting Diagnosis ED Disposition     ED Disposition  Admit   Condition  --   Comment  Hospital Area: [facility] [[id number]]  Level of Care: Telemetry Medical [104]  May place patient in observation at [facility] or [facility] if equivalent level of care is available:: Yes  Covid Evaluation: Asymptomatic - no recent exposure (last 10 days) testing not required  Diagnosis: Seizures [facility]) [id number]  Admitting Physician: Vianne Bulls [id number]  Attending Physician: Vianne Bulls [id number]          B Medical/Surgery History Past Medical History:  Diagnosis Date   Anxiety    Depression    H/O gastric bypass    Insomnia    Marijuana use 01/08/2021   Moderate alcohol use disorder ([place]) 01/08/2021   Past Surgical History:  Procedure Laterality Date   CHOLECYSTECTOMY     GASTRIC BYPASS     GASTRIC BYPASS     LAPAROSCOPY N/A 08/11/2019   Procedure: LAPAROSCOPY DIAGNOSTIC;  Surgeon: Erroll Luna, MD;  Location: [facility];  Service: General;  Laterality: N/A;     A IV Location/Drains/Wounds Patient Lines/Drains/Airways Status     Active Line/Drains/Airways     Name Placement date Placement time Site Days   Peripheral IV 10/03/22 22 G Left;Posterior Hand 10/03/22   1500  Hand  less than 1   Wound / Incision (Open or Dehisced) 10/17/20 Puncture Ankle Anterior;Left small puncture from fall 10/17/20  0225  Ankle  716            Intake/Output Last 24 hours  Intake/Output Summary (Last 24 hours) at 10/03/2022 2140 Last data filed at 10/03/2022 1828 Gross per 24 hour  Intake 1150 ml  Output --  Net 1150 ml    Labs/Imaging Results for orders placed or performed during the hospital encounter of 10/03/22 (from the past 48 hour(s))  Comprehensive metabolic panel     Status: Abnormal   Collection Time: 10/03/22  1:14 PM  Result Value Ref Range   Sodium 135 135 - 145 mmol/L   Potassium 3.7 3.5 - 5.1 mmol/L   Chloride 99 98 - 111 mmol/L   CO2 23 22 - 32 mmol/L   Glucose, Bld 136 (H) 70 - 99 mg/dL    Comment: Glucose reference range applies only to samples taken after fasting for at least 8 hours.   BUN <5 (L) 6 - 20 mg/dL   Creatinine, Ser 0.79 0.44 - 1.00 mg/dL   Calcium 8.8 (L) 8.9 - 10.3 mg/dL   Total Protein 6.4 (L) 6.5 - 8.1 g/dL   Albumin 3.6 3.5 - 5.0 g/dL   AST 26 15 - 41 U/L   ALT 12 0 - 44 U/L   Alkaline Phosphatase 61 38 - 126 U/L  Total Bilirubin 1.1 0.3 - 1.2 mg/dL   GFR, Estimated >60 >60 mL/min    Comment: (NOTE) Calculated using the CKD-EPI Creatinine Equation (2021)    Anion gap 13 5 - 15    Comment: Performed at [facility] [address], [place], [place] [address]  CBC with Differential/Platelet     Status: Abnormal   Collection Time: 10/03/22  1:14 PM  Result Value Ref Range   WBC 14.9 (H) 4.0 - 10.5 K/uL   RBC 3.44 (L) 3.87 - 5.11 MIL/uL   Hemoglobin 12.0 12.0 - 15.0 g/dL   HCT 36.0 36.0 - 46.0 %   MCV 104.7 (H) 80.0 - 100.0 fL   MCH 34.9 (H) 26.0 - 34.0 pg   MCHC 33.3 30.0 - 36.0 g/dL   RDW 15.3 11.5 - 15.5 %   Platelets 369 150 - 400 K/uL   nRBC 0.0 0.0 - 0.2 %   Neutrophils Relative % 92 %   Neutro Abs 13.7 (H) 1.7 - 7.7 K/uL   Lymphocytes Relative 3 %   Lymphs Abs 0.4 (L) 0.7 - 4.0 K/uL    Monocytes Relative 5 %   Monocytes Absolute 0.7 0.1 - 1.0 K/uL   Eosinophils Relative 0 %   Eosinophils Absolute 0.0 0.0 - 0.5 K/uL   Basophils Relative 0 %   Basophils Absolute 0.1 0.0 - 0.1 K/uL   Immature Granulocytes 0 %   Abs Immature Granulocytes 0.04 0.00 - 0.07 K/uL    Comment: Performed at [facility], [place] [address], [place], [place] [address]  Magnesium     Status: Abnormal   Collection Time: 10/03/22  1:14 PM  Result Value Ref Range   Magnesium 1.5 (L) 1.7 - 2.4 mg/dL    Comment: Performed at [facility] [address] [place], [place] [address]  Ethanol     Status: None   Collection Time: 10/03/22  1:40 PM  Result Value Ref Range   Alcohol, Ethyl (B) <10 <10 mg/dL    Comment: (NOTE) Lowest detectable limit for serum alcohol is 10 mg/dL.  For medical purposes only. Performed at [facility], [place] [address], [place], [place] [address]   I-Stat beta hCG blood, ED     Status: None   Collection Time: 10/03/22  1:53 PM  Result Value Ref Range   I-stat hCG, quantitative <5.0 <5 mIU/mL   Comment 3            Comment:   GEST. AGE      CONC.  (mIU/mL)   <=1 WEEK        5 - 50     2 WEEKS       50 - 500     3 WEEKS       100 - 10,000     4 WEEKS     1,000 - 30,000        FEMALE AND NON-PREGNANT FEMALE:     LESS THAN 5 mIU/mL   CBG monitoring, ED     Status: Abnormal   Collection Time: 10/03/22  2:45 PM  Result Value Ref Range   Glucose-Capillary 120 (H) 70 - 99 mg/dL    Comment: Glucose reference range applies only to samples taken after fasting for at least 8 hours.  CBG monitoring, ED     Status: Abnormal   Collection Time: 10/03/22  7:41 PM  Result Value Ref Range   Glucose-Capillary 114 (H) 70 - 99  mg/dL    Comment: Glucose reference range applies only to samples taken after fasting for at least 8 hours.   CT Head Wo Contrast  Result Date: 10/03/2022 CLINICAL DATA:  [place] out of bed.  Blunt head trauma.  Headache. EXAM: CT HEAD WITHOUT  CONTRAST TECHNIQUE: Contiguous axial images were obtained from the base of the skull through the vertex without intravenous contrast. RADIATION DOSE REDUCTION: This exam was performed according to the departmental dose-optimization program which includes automated exposure control, adjustment of the mA and/or kV according to patient size and/or use of iterative reconstruction technique. COMPARISON:  08/22/2022 FINDINGS: Brain: No evidence of intracranial hemorrhage, acute infarction, hydrocephalus, extra-axial collection, or mass lesion/mass effect. Vascular:  No hyperdense vessel or other acute findings. Skull: No evidence of fracture or other significant bone abnormality. Sinuses/Orbits:  No acute findings. Other: None. IMPRESSION: Negative noncontrast head CT. Electronically Signed   By: Marlaine Hind M.D.   On: 10/03/2022 17:02    Pending Labs Unresulted Labs (From admission, onward)     Start     Ordered   10/10/22 0500  Creatinine, serum  (enoxaparin (LOVENOX)    CrCl >/= 30 ml/min)  Weekly,   R     Comments: while on enoxaparin therapy    10/03/22 2036   10/04/22 [id number]  Basic metabolic panel  Daily,   R      10/03/22 2036   10/04/22 0500  Magnesium  Tomorrow morning,   R        10/03/22 2036   10/04/22 0500  CBC  Daily,   R      10/03/22 2036   10/03/22 1329  Rapid urine drug screen (hospital performed)  Once,   STAT        10/03/22 1328            Vitals/Pain Today's Vitals   10/03/22 1815 10/03/22 1845 10/03/22 2015 10/03/22 2112  BP:  110/67 111/68   Pulse:  85 82 77  Resp:  15 15   Temp: 98.4 F (36.9 C)  98.3 F (36.8 C)   TempSrc: Oral  Oral   SpO2:  98% 99%   Weight:      Height:        Isolation Precautions No active isolations  Medications Medications  LORazepam (ATIVAN) tablet 1-4 mg (has no administration in time range)    Or  LORazepam (ATIVAN) injection 1-4 mg (has no administration in time range)  thiamine (VITAMIN B1) tablet 100 mg (has no  administration in time range)    Or  thiamine (VITAMIN B1) injection 100 mg (has no administration in time range)  folic acid (FOLVITE) tablet 1 mg (has no administration in time range)  multivitamin with minerals tablet 1 tablet (has no administration in time range)  gabapentin (NEURONTIN) capsule 600 mg (has no administration in time range)  LORazepam (ATIVAN) tablet 0-4 mg ( Oral Not Given 10/03/22 2111)    Followed by  LORazepam (ATIVAN) tablet 0-4 mg (has no administration in time range)  enoxaparin (LOVENOX) injection 40 mg (has no administration in time range)  sodium chloride flush (NS) 0.9 % injection 3 mL (has no administration in time range)  acetaminophen (TYLENOL) tablet 650 mg (has no administration in time range)    Or  acetaminophen (TYLENOL) suppository 650 mg (has no administration in time range)  polyethylene glycol (MIRALAX / GLYCOLAX) packet 17 g (has no administration in time range)  ondansetron (ZOFRAN) tablet 4 mg (has no administration  in time range)    Or  ondansetron (ZOFRAN) injection 4 mg (has no administration in time range)  magnesium sulfate IVPB 1 g 100 mL (has no administration in time range)  sodium chloride 0.9 % bolus 1,000 mL (0 mLs Intravenous Stopped 10/03/22 1810)  ketorolac (TORADOL) 30 MG/ML injection 30 mg (30 mg Intravenous Given 10/03/22 1341)  metoCLOPramide (REGLAN) injection 10 mg (10 mg Intravenous Given 10/03/22 1508)  magnesium sulfate IVPB 2 g 50 mL (0 g Intravenous Stopped 10/03/22 1709)  lidocaine (PF) (XYLOCAINE) 1 % injection 5 mL (5 mLs Infiltration Given 10/03/22 1709)  gabapentin (NEURONTIN) capsule 600 mg (600 mg Oral Given 10/03/22 1709)  LORazepam (ATIVAN) injection 1 mg (1 mg Intravenous Given 10/03/22 1709)  levETIRAcetam (KEPPRA) IVPB 1500 mg/ 100 mL premix (0 mg Intravenous Stopped 10/03/22 1828)  lidocaine-EPINEPHrine-tetracaine (LET) topical gel (3 mLs Topical Given 10/03/22 1838)    Mobility walks     Focused  Assessments Neuro Assessment Handoff:  Swallow screen pass? Yes          Neuro Assessment: Exceptions to WDL (2 witnessed seizures today. now c/o headache) Neuro Checks:      Has TPA been given? No If patient is a Neuro Trauma and patient is going to OR before floor call report to Home nurse: [phone] or [phone]   R Recommendations: See Admitting Provider Note  Report given to:   Additional Notes:

## 2022-10-03 NOTE — Procedures (Addendum)
Patient Name: Marie Myers  MRN: [id number]  Epilepsy Attending: Lora Havens  Referring Physician/Provider: Vianne Bulls, MD  Date: 2/102024 Duration: 23.34 mins  Patient history: 31yo f with breakthrough seizure, not back to baseline. EEG to evaluate for status, subclinical seizure.  Level of alertness: Awake, asleep  AEDs during EEG study: Ativan, Keppra  Technical aspects: This EEG study was done with scalp electrodes positioned according to the 10-20 International system of electrode placement. Electrical activity was reviewed with band pass filter of 1-70Hz$ , sensitivity of 7 uV/mm, display speed of 28m/sec with a 60Hz$  notched filter applied as appropriate. EEG data were recorded continuously and digitally stored.  Video monitoring was available and reviewed as appropriate.  Description: The posterior dominant rhythm consists of 7 Hz activity of moderate voltage (25-35 uV) seen predominantly in posterior head regions, symmetric and reactive to eye opening and eye closing. Sleep was characterized by vertex waves, sleep spindles (12 to 14 Hz), maximal frontocentral region. Frequent sharp waves were noted in right>left frontal region, predominantly in sleep.  Hyperventilation and photic stimulation were not performed.     ABNORMALITY -Sharp wave, right>left frontal region - background slow  IMPRESSION: This study showed evidence of epileptogenicity arising from right>left frontal region. Additionally here is mild diffuse encephalopathy, could be secondary to post-ictal state. No seizures were seen throughout the recording.  Warnell Rasnic OBarbra Sarks

## 2022-10-03 NOTE — ED Provider Notes (Signed)
  Physical Exam  BP 116/68   Pulse 77   Temp 98.3 F (36.8 C) (Oral)   Resp 15   Ht 5\' 6"  (1.676 m)   Wt 89.8 kg   SpO2 99%   BMI 31.96 kg/m   Physical Exam  Procedures  Procedures  ED Course / MDM   Clinical Course as of 10/03/22 2015  Sat Oct 03, 2022  1955 Patient continues to be confused and is not back to her neurologic baseline normally admitted for prolonged postictal period. [VK]    Clinical Course User Index [VK] Kemper Durie, DO   Medical Decision Making Amount and/or Complexity of Data Reviewed Labs: ordered. Radiology: ordered.  Risk Prescription drug management. Decision regarding hospitalization.   Patient signed out to me at 1500 by Dr. Troponin pending CT head and UDS.  In short this is a 32 year old female with a past medical history of migraines, substance use and depression presenting to the emergency department with a seizure.  Patient had labs performed that showed a mild leukocytosis and mild hypomagnesemia but otherwise within normal range.  The patient was complaining of a headache and was treated with a migraine cocktail without any improvement in CT head will be performed in the setting of possible new onset seizure.  Around 1600 I was alerted by the patient's nurse that she had had another seizure here in the ED and had fallen out of bed.  The seizure was unwitnessed but she was found on the floor postictal appearing with a laceration above her left eyebrow.  She was assisted back into bed.  She is able to follow commands in all 4 extremities but is unable to tell [place] her name or date of birth at this time.  She is mildly tachycardic but satting well on room air.  A dressing was placed on her forehead and she will require laceration repair.  CT head is pending at this time.  I spoke with Dr. Malen Gauze of neurology who recommended starting her home gabapentin and if she does not return to her baseline within an hour will require admission for EEG and  inpatient neurology evaluation.       Leanord Asal K, DO 10/03/22 2015

## 2022-10-03 NOTE — ED Provider Notes (Signed)
..  Laceration Repair  Date/Time: 10/03/2022 7:51 PM  Performed by: Sherrell Puller, PA-C Authorized by: Sherrell Puller, PA-C   Consent:    Consent obtained:  Verbal   Consent given by:  Patient   Risks, benefits, and alternatives were discussed: yes     Risks discussed:  Pain and infection Universal protocol:    Procedure explained and questions answered to patient or proxy's satisfaction: yes     Patient identity confirmed:  Arm band Anesthesia:    Anesthesia method:  Topical application   Topical anesthetic:  LET Laceration details:    Location:  Face   Face location:  L upper eyelid   Extent:  Superficial   Length (cm):  1.3 Exploration:    Wound exploration: wound explored through full range of motion and entire depth of wound visualized     Contaminated: no   Treatment:    Area cleansed with:  Shur-Clens   Amount of cleaning:  Standard   Irrigation solution:  Sterile saline   Irrigation volume:  10cc   Irrigation method:  Syringe and tap Skin repair:    Repair method:  Sutures   Suture size:  5-0   Suture material:  Prolene   Suture technique:  Simple interrupted   Number of sutures:  3 Approximation:    Approximation:  Close Repair type:    Repair type:  Simple Post-procedure details:    Dressing:  Open (no dressing)   Procedure completion:  Tolerated well, no immediate complications     Sherrell Puller, PA-C 10/03/22 1952    Kemper Durie, DO 10/03/22 2311

## 2022-10-03 NOTE — ED Notes (Signed)
EEG tech at bedside. 

## 2022-10-03 NOTE — Progress Notes (Signed)
LTM EEG hooked up and running Same leads used - no initial skin breakdown - push button tested - [facility] monitoring.'

## 2022-10-03 NOTE — ED Notes (Signed)
Patient uncooperative to RN commands at this time and not wanting to answer questions, patient just states "sleep" and proceeds to lay on side and close. NAD.

## 2022-10-03 NOTE — ED Notes (Addendum)
This RN noticed that the pt's IV pump was beeping @ approximately 1600. Upon entrance into the pt's room, the pt was found on the floor with blood covering her face. Pt assessed for injuries. Approximately, 2 cm lac above left eye. Pt disoriented and diaphoretic upon assessment. MD notified and at bedside. Pt assisted back into bed. Awaiting further orders.

## 2022-10-03 NOTE — Progress Notes (Signed)
Notified by Dr. Hortense Ramal, frequent bifrontal sharp waves.   Follows commands Is able to tell me she is at [place] to tell me the month, reports it is [place] she is here due to seizures Thinks she takes gabapentin once a day, able to tell me she also takes trazadone, sertraline, olanzapine Remains slow to respond, quite sleepy  Exam seems similar to slightly improved from what is documented by my colleague. Given alcohol use history will give additional dose of benzos at this time and slightly increase prior recommendation for standing keppra dose for now. Plan may be adjusted based on response to medications and full EEG report tomorrow  Overnight plan: Midazolam 2 mg IV x 1 dose (due to ativan shortage)  Order for Keppra 750 mg BID placed, first dose at 5 AM (~12 hours post 1500 mg loading dose) Neurology will follow along

## 2022-10-03 NOTE — Progress Notes (Signed)
EEG complete - results pending 

## 2022-10-03 NOTE — ED Notes (Signed)
Pt's laceration irrigated and cleaned with normal saline at this time. Topical lidogel in place on wound.

## 2022-10-03 NOTE — Consult Note (Signed)
Neurology Consultation  Reason for Consult: Seizures Referring Physician: Dr. Maylon Peppers   CC: Seizures  History is obtained from: Chart, patient  HPI: Marie Myers is a 32 y.o. female who has a past medical history of anxiety, depression, marijuana use, alcohol abuse, seen for her first seizure in the setting of possible alcohol abuse and withdrawal in December 2023, advised to be on increased doses of gabapentin which she was previously on prior to the seizures, coming in with multiple seizures today.  Had another episode of possible seizure because she was found down on the ground by her hospital bed by the staff. She denies taking any medications at this time and ED provider was also given some history of noncompliance. She said her last drink was about 3 days ago. After the hospital discharge, she followed up with Dr. April Manson, was recommended to continue gabapentin 600 mg 3 times daily as well as alcohol cessation and psychiatry follow-up.  She followed up with psychiatry for SI, alcohol induced mood disorder, alcohol dependence and depression. She was given her instructions for Neurontin at increased dose, olanzapine, sertraline and trazodone.  At this time patient is unable to provide reliable history whether she is taking her medications or not.  [id number] to obtain due to altered mental status.   Past Medical History:  Diagnosis Date   Anxiety    Depression    H/O gastric bypass    Insomnia    Marijuana use 01/08/2021   Moderate alcohol use disorder ([place]) 01/08/2021     Family History  Problem Relation Age of Onset   Diabetes Mother    Diabetes Father      Social History:   reports that she has never smoked. She has never used smokeless tobacco. She reports current alcohol use of about 1.0 standard drink of alcohol per week. She reports current drug use. Drug: Marijuana.  Medications  Current Facility-Administered Medications:    gabapentin (NEURONTIN) capsule 600  mg, 600 mg, Oral, Once, Kingsley, Victoria K, DO   levETIRAcetam (KEPPRA) IVPB 1500 mg/ 100 mL premix, 1,500 mg, Intravenous, Once, Kingsley, Victoria K, DO   lidocaine (PF) (XYLOCAINE) 1 % injection 5 mL, 5 mL, Infiltration, Once, Kingsley, Victoria K, DO   LORazepam (ATIVAN) injection 1 mg, 1 mg, Intravenous, Once, Kingsley, Victoria K, DO   magnesium sulfate IVPB 2 g 50 mL, 2 g, Intravenous, Once, Trifan, Carola Rhine, MD, Last Rate: 50 mL/hr at 10/03/22 1549, 2 g at 10/03/22 1549  Current Outpatient Medications:    guanFACINE (TENEX) 1 MG tablet, Take 1 tablet (1 mg total) by mouth at bedtime., Disp: 30 tablet, Rfl: 3   hydrOXYzine (ATARAX) 25 MG tablet, Take 1 tablet (25 mg total) by mouth 3 (three) times daily as needed for anxiety., Disp: 90 tablet, Rfl: 2   lisinopril (ZESTRIL) 5 MG tablet, Take 1 tablet (5 mg total) by mouth daily., Disp: 30 tablet, Rfl: 0   OLANZapine (ZYPREXA) 15 MG tablet, Take 1 tablet (15 mg total) by mouth at bedtime., Disp: 30 tablet, Rfl: 2   ondansetron (ZOFRAN ODT) 4 MG disintegrating tablet, Take 1 tablet (4 mg total) by mouth every 8 (eight) hours as needed for nausea or vomiting., Disp: 10 tablet, Rfl: 0   sertraline (ZOLOFT) 100 MG tablet, Take 2 tablets (200 mg total) by mouth daily., Disp: 60 tablet, Rfl: 2   sucralfate (CARAFATE) 1 GM/10ML suspension, Take 10 mLs (1 g total) by mouth 4 (four) times daily -  with meals and  at bedtime., Disp: 420 mL, Rfl: 0   thiamine 100 MG tablet, Take 1 tablet (100 mg total) by mouth daily., Disp: , Rfl:    traZODone (DESYREL) 100 MG tablet, Take 2 tablets (200 mg total) by mouth at bedtime., Disp: 60 tablet, Rfl: 2   vitamin B-12 1000 MCG tablet, Take 1 tablet (1,000 mcg total) by mouth daily., Disp: 30 tablet, Rfl: 0   Vitamin D, Ergocalciferol, (DRISDOL) 1.25 MG (50000 UNIT) CAPS capsule, Take 50,000 Units by mouth every 7 (seven) days. Every tuesday, Disp: , Rfl:    VIVITROL 380 MG SUSR, Inject 380 mg into the muscle  every 28 (twenty-eight) days., Disp: , Rfl:    Exam: Current vital signs: BP 125/72   Pulse 76   Temp 98.3 F (36.8 C) (Oral)   Resp (!) 24   Ht 5' 6"$  (1.676 m)   Wt 89.8 kg   SpO2 98%   BMI 31.96 kg/m  Vital signs in last 24 hours: Temp:  [98.3 F (36.8 C)] 98.3 F (36.8 C) (02/10 1229) Pulse Rate:  [76-115] 76 (02/10 1545) Resp:  [15-24] 24 (02/10 1545) BP: (116-144)/(68-102) 125/72 (02/10 1545) SpO2:  [95 %-99 %] 98 % (02/10 1545) Weight:  [89.8 kg] 89.8 kg (02/10 1231) General: Sleeping in bed, awakens to voice, follows commands. HEENT: Small laceration on the left forehead covered with bandage.  Swollen lips, tongue bite. CVS: Regular rhythm Abdomen nondistended nontender Neurological exam She was sleeping in bed Awakens to voice Follows commands Is able to tell me she is at [place] to tell me the month Unable to tell me why she is here Unable to clearly tell me her medication list No dysarthria No aphasia Poor attention concentration Cranial nerves II to XII intact Motor exam with no drift Sensation intact light touch Coordination exam with no dysmetria  Labs I have reviewed labs in epic and the results pertinent to this consultation are:  CBC    Component Value Date/Time   WBC 14.9 (H) 10/03/2022 1314   RBC 3.44 (L) 10/03/2022 1314   HGB 12.0 10/03/2022 1314   HCT 36.0 10/03/2022 1314   PLT 369 10/03/2022 1314   MCV 104.7 (H) 10/03/2022 1314   MCH 34.9 (H) 10/03/2022 1314   MCHC 33.3 10/03/2022 1314   RDW 15.3 10/03/2022 1314   LYMPHSABS 0.4 (L) 10/03/2022 1314   MONOABS 0.7 10/03/2022 1314   EOSABS 0.0 10/03/2022 1314   BASOSABS 0.1 10/03/2022 1314    CMP     Component Value Date/Time   NA 135 10/03/2022 1314   K 3.7 10/03/2022 1314   CL 99 10/03/2022 1314   CO2 23 10/03/2022 1314   GLUCOSE 136 (H) 10/03/2022 1314   BUN <5 (L) 10/03/2022 1314   CREATININE 0.79 10/03/2022 1314   CALCIUM 8.8 (L) 10/03/2022 1314    PROT 6.4 (L) 10/03/2022 1314   ALBUMIN 3.6 10/03/2022 1314   AST 26 10/03/2022 1314   ALT 12 10/03/2022 1314   ALKPHOS 61 10/03/2022 1314   BILITOT 1.1 10/03/2022 1314   GFRNONAA >60 10/03/2022 1314   GFRAA >60 12/27/2019 0810    Lipid Panel     Component Value Date/Time   CHOL 177 01/12/2022 0623   TRIG 42 01/12/2022 0623   HDL 119 01/12/2022 0623   CHOLHDL 1.5 01/12/2022 0623   VLDL 8 01/12/2022 0623   LDLCALC 50 01/12/2022 0623     Imaging I have reviewed the images obtained:  CT head-no  acute changes  Assessment: 32 year old with past history of anxiety, depression, marijuana use, alcohol abuse for some seizure in December, likely noncompliant to her home gabapentin 600 3 times daily, presenting for 2 seizures at home and possibly another unwitnessed seizure in the emergency room. Has tongue bite and laceration of the forehead as well as swollen lip. Last alcohol use was 3 days ago. Reports noncompliance to medications Still somewhat altered and not back to baseline mentation  Impression: Breakthrough seizure in the setting of medication noncompliance and possible alcohol withdrawal Possible alcohol withdrawal  Recommendations: Load with Keppra 1500 mg IV x 1.  Not sure if she would need standing doses but this is to make sure that the current flurry of seizures is broken. Continue home gabapentin 600 3 times daily Stress the importance of medication compliance If she is back to her baseline in the next hour or so, she can be discharged home with outpatient follow-up.  If she does not come back to baseline, may need admission for observation to the hospitalist. If she is admitted, also obtain EEG to ensure there is no ongoing electrographic abnormality. If she is unable to take her p.o. gabapentin while she is hospitalized, start her on Keppra 500 twice daily, until she can be back on her p.o. medications. We will have to figure out eventual outpatient antiepileptic  once she is either seen by outpatient neurology clinic if she is back to baseline or if she is admitted, I will see her again tomorrow and provide further recommendations. Maintain seizure precautions Discuss driving restrictions as below. Plan was discussed with Dr. Maylon Peppers  -- Amie Portland, MD Neurologist [facility] Pager: [phone]  [place] Per [facility] statutes, patients with seizures are not allowed to drive until they have been seizure-free for six months.   Use caution when using heavy equipment or power tools. Avoid working on ladders or at heights. Take showers instead of baths. Ensure the water temperature is not too high on the home water heater. Do not go swimming alone. Do not lock yourself in a room alone (i.e. bathroom). When caring for infants or small children, sit down when holding, feeding, or changing them to minimize risk of injury to the child in the event you have a seizure. Maintain good sleep hygiene. Avoid alcohol.    If patient has another seizure, call 911 and bring them back to the ED if: A.  The seizure lasts longer than 5 minutes.      B.  The patient doesn't wake shortly after the seizure or has new problems such as difficulty seeing, speaking or moving following the seizure C.  The patient was injured during the seizure D.  The patient has a temperature over 102 F (39C) E.  The patient vomited during the seizure and now is having trouble breathing

## 2022-10-03 NOTE — ED Triage Notes (Signed)
Reports had two seizures today.  Reports first one last approx 45 secs and bit her lip.  Patient reports last drink was 2-3 days ago.  BF reports first seizure was mostly her upper body but 2nd was full body shaking.

## 2022-10-03 NOTE — ED Provider Notes (Signed)
[facility] Provider Note   CSN: [id number] Arrival date & time: 10/03/22  1219     History  Chief Complaint  Patient presents with   Seizures    Marie Myers is a 32 y.o. female presenting to the ED with concern for seizure-like activity.  Supplemental history is provided by her partner at the bedside, reports that she had 2 seizure-like episodes today, 1 at 7 AM and the other around 11:30 AM.  Both episodes lasted between 30 seconds and 2 to 3 minutes.  Patient was noted to have generalized tonic-clonic shaking.  She was confused afterwards for about 30 minutes prior to returning to her baseline, and is now close to her baseline mental status.  She did not urinate on herself but she did bite her lower lip.  She reports that she has a headache and feels tired.  Patient had her first seizure episodes in December, 2 months ago.  At that time she had an MRI and EEG performed which were unremarkable.  The seizures were felt to be possibly related to alcohol withdrawal or alcohol consumption, although she reports she just darted drinking alcohol last year, and had no history of alcohol withdrawal.  She was subsequently seen by neurology in the office, he was not started on antiepileptics, but advised refraining from alcohol.  She last had a drink about 3 days ago.  She denies excessive drinking.  She denies any other recreational drugs aside from marijuana which she and her boyfriend was using for a long time.  She denies family history of epilepsy.  She does report a personal history of migraines  HPI     Home Medications Prior to Admission medications   Medication Sig Start Date End Date Taking? Authorizing Provider  guanFACINE (TENEX) 1 MG tablet Take 1 tablet (1 mg total) by mouth at bedtime. 05/28/22   Salley Slaughter, NP  hydrOXYzine (ATARAX) 25 MG tablet Take 1 tablet (25 mg total) by mouth 3 (three) times daily as needed for anxiety.  08/25/22   Armando Reichert, MD  lisinopril (ZESTRIL) 5 MG tablet Take 1 tablet (5 mg total) by mouth daily. 01/14/22 08/22/22  Massengill, Ovid Curd, MD  OLANZapine (ZYPREXA) 15 MG tablet Take 1 tablet (15 mg total) by mouth at bedtime. 08/25/22   Armando Reichert, MD  ondansetron (ZOFRAN ODT) 4 MG disintegrating tablet Take 1 tablet (4 mg total) by mouth every 8 (eight) hours as needed for nausea or vomiting. 01/19/21   Caccavale, Sophia, PA-C  sertraline (ZOLOFT) 100 MG tablet Take 2 tablets (200 mg total) by mouth daily. 08/25/22   Armando Reichert, MD  sucralfate (CARAFATE) 1 GM/10ML suspension Take 10 mLs (1 g total) by mouth 4 (four) times daily -  with meals and at bedtime. 01/19/21   Caccavale, Sophia, PA-C  thiamine 100 MG tablet Take 1 tablet (100 mg total) by mouth daily. 01/14/21   Ethelene Hal, NP  traZODone (DESYREL) 100 MG tablet Take 2 tablets (200 mg total) by mouth at bedtime. 08/25/22   Armando Reichert, MD  vitamin B-12 1000 MCG tablet Take 1 tablet (1,000 mcg total) by mouth daily. 01/14/21   Ethelene Hal, NP  Vitamin D, Ergocalciferol, (DRISDOL) 1.25 MG (50000 UNIT) CAPS capsule Take 50,000 Units by mouth every 7 (seven) days. Every tuesday    [provider]  VIVITROL 380 MG SUSR Inject 380 mg into the muscle every 28 (twenty-eight) days. 08/21/22   [provider]      Allergies    Zithromax [azithromycin]    Review of Systems   Review of Systems  Physical Exam Updated Vital Signs BP 116/68   Pulse 77   Temp 98.3 F (36.8 C) (Oral)   Resp 15   Ht 5' 6"$  (1.676 m)   Wt 89.8 kg   SpO2 99%   BMI 31.96 kg/m  Physical Exam Constitutional:      General: She is not in acute distress. HENT:     Head: Normocephalic and atraumatic.  Eyes:     Conjunctiva/sclera: Conjunctivae normal.     Pupils: Pupils are equal, round, and reactive to light.  Cardiovascular:     Rate and Rhythm: Normal rate and regular rhythm.  Pulmonary:     Effort: Pulmonary effort is  normal. No respiratory distress.  Abdominal:     General: There is no distension.     Tenderness: There is no abdominal tenderness.  Skin:    General: Skin is warm and dry.  Neurological:     General: No focal deficit present.     Mental Status: She is alert and oriented to person, place, and time. Mental status is at baseline.  Psychiatric:        Mood and Affect: Mood normal.        Behavior: Behavior normal.     ED Results / Procedures / Treatments   Labs (all labs ordered are listed, but only abnormal results are displayed) Labs Reviewed  COMPREHENSIVE METABOLIC PANEL - Abnormal; Notable for the following components:      Result Value   Glucose, Bld 136 (*)    BUN <5 (*)    Calcium 8.8 (*)    Total Protein 6.4 (*)    All other components within normal limits  CBC WITH DIFFERENTIAL/PLATELET - Abnormal; Notable for the following components:   WBC 14.9 (*)    RBC 3.44 (*)    MCV 104.7 (*)    MCH 34.9 (*)    Neutro Abs 13.7 (*)    Lymphs Abs 0.4 (*)    All other components within normal limits  MAGNESIUM - Abnormal; Notable for the following components:   Magnesium 1.5 (*)    All other components within normal limits  CBG MONITORING, ED - Abnormal; Notable for the following components:   Glucose-Capillary 120 (*)    All other components within normal limits  ETHANOL  RAPID URINE DRUG SCREEN, HOSP PERFORMED  I-STAT BETA HCG BLOOD, ED ([facility], [facility], [facility] ONLY)    EKG EKG Interpretation  Date/Time:  Saturday October 03 2022 12:35:56 EST Ventricular Rate:  113 PR Interval:  174 QRS Duration: 66 QT Interval:  252 QTC Calculation: 345 R Axis:   21 Text Interpretation: Sinus tachycardia Low voltage QRS Cannot rule out Anterior infarct , age undetermined Abnormal ECG When compared with ECG of 22-Aug-2022 16:36, PREVIOUS ECG IS PRESENT Confirmed by Octaviano Glow [phone]) on 10/03/2022 1:54:22 PM  Radiology No results found.  Procedures Procedures    Medications Ordered  in ED Medications  magnesium sulfate IVPB 2 g 50 mL (2 g Intravenous New Bag/Given 10/03/22 1549)  sodium chloride 0.9 % bolus 1,000 mL (1,000 mLs Intravenous New Bag/Given 10/03/22 1341)  ketorolac (TORADOL) 30 MG/ML injection 30 mg (30 mg Intravenous Given 10/03/22 1341)  metoCLOPramide (REGLAN) injection 10 mg (10 mg Intravenous Given 10/03/22 1508)    ED Course/ Medical Decision Making/ A&P  Medical Decision Making Amount and/or Complexity of Data Reviewed Labs: ordered. Radiology: ordered.  Risk Prescription drug management.   This patient presents to the ED with concern for seizure-like activity. This involves an extensive number of treatment options, and is a complaint that carries with it a high risk of complications and morbidity.  The differential diagnosis includes breakthrough seizure  Co-morbidities that complicate the patient evaluation: History of alcohol use disorder and marijuana use at high risk of recurring seizures  Additional history obtained from patient's boyfriend at bedside  External records from outside source obtained and reviewed including MRI, EEG, and outpatient neurology evaluation from December and January  I ordered and personally interpreted labs.  The pertinent results include: Hypomagnesia, glucose within normal limits.  Potassium unremarkable.  Patient has a mild leukocytosis, which she has had in the past as well, and this may be some hemoconcentration or reactive.  MCV of 104 and Mg 1.5 are both indicative alcohol use disorder  I ordered imaging studies including CT scan of the head, given her report of worsening or abnormal migraine. CT imaging was pending at the time of signout  The patient was maintained on a cardiac monitor.  I personally viewed and interpreted the cardiac monitored which showed an underlying rhythm of: Sinus rhythm  Per my interpretation the patient's ECG shows no acute ischemic findings  I  ordered medication including IV magnesium for low magnesium level.  IV medications and fluids for headache and migraine  I have reviewed the patients home medicines and have made adjustments as needed  Test Considered: Low suspicion of acute meningitis or acute PE denies any indication for lumbar puncture at this time.  I discussed the case with Dr Rory Percy from neurology by phone, who advised that this patient seizures are likely being maintained with gabapentin medication, although the patient has not been taking the gabapentin as regularly scheduled or advised because she felt it was not effective.  Patient should be advised to avoid any alcohol or illicit drug use, as even single use is can trigger seizure episodes.   Dispostion:  Patient signed out to Dr Maylon Peppers EDP pending f/u on CT imaging and reassessment of headache.  Patient remained stable, I anticipate she can be discharged with outpatient follow-up with neurology.         Final Clinical Impression(s) / ED Diagnoses Final diagnoses:  Seizure-like activity ([place])  Hypomagnesemia    Rx / DC Orders ED Discharge Orders     None         Atreyu Mak, Carola Rhine, MD 10/03/22 [phone]

## 2022-10-04 DIAGNOSIS — R569 Unspecified convulsions: Secondary | ICD-10-CM | POA: Diagnosis not present

## 2022-10-04 LAB — CBC
HCT: 31.7 % — ABNORMAL LOW (ref 36.0–46.0)
Hemoglobin: 10.9 g/dL — ABNORMAL LOW (ref 12.0–15.0)
MCH: 35.3 pg — ABNORMAL HIGH (ref 26.0–34.0)
MCHC: 34.4 g/dL (ref 30.0–36.0)
MCV: 102.6 fL — ABNORMAL HIGH (ref 80.0–100.0)
Platelets: 330 10*3/uL (ref 150–400)
RBC: 3.09 MIL/uL — ABNORMAL LOW (ref 3.87–5.11)
RDW: 15.4 % (ref 11.5–15.5)
WBC: 13.4 10*3/uL — ABNORMAL HIGH (ref 4.0–10.5)
nRBC: 0 % (ref 0.0–0.2)

## 2022-10-04 LAB — BASIC METABOLIC PANEL
Anion gap: 8 (ref 5–15)
BUN: 6 mg/dL (ref 6–20)
CO2: 23 mmol/L (ref 22–32)
Calcium: 8.4 mg/dL — ABNORMAL LOW (ref 8.9–10.3)
Chloride: 104 mmol/L (ref 98–111)
Creatinine, Ser: 0.62 mg/dL (ref 0.44–1.00)
GFR, Estimated: >60 mL/min (ref >60)
Glucose, Bld: 79 mg/dL (ref 70–99)
Potassium: 3.5 mmol/L (ref 3.5–5.1)
Sodium: 135 mmol/L (ref 135–145)

## 2022-10-04 LAB — MAGNESIUM: Magnesium: 2.1 mg/dL (ref 1.7–2.4)

## 2022-10-04 MED ORDER — ADULT MULTIVITAMIN W/MINERALS CH: 1.0000 | ORAL_TABLET | Freq: Every day | ORAL | 1 refills | Status: DC

## 2022-10-04 MED ORDER — GABAPENTIN 300 MG PO CAPS
600.0000 mg | ORAL_CAPSULE | Freq: Four times a day (QID) | ORAL | Status: DC
  Administered 2022-10-04: 600 mg via ORAL
  Filled 2022-10-04: qty 2

## 2022-10-04 MED ORDER — FOLIC ACID 1 MG PO TABS: 1.0000 mg | ORAL_TABLET | Freq: Every day | ORAL | 0 refills | Status: DC

## 2022-10-04 MED ORDER — VITAMIN B-1 100 MG PO TABS: 100.0000 mg | ORAL_TABLET | Freq: Every day | ORAL | 1 refills | Status: DC

## 2022-10-04 MED ORDER — LEVETIRACETAM 500 MG PO TABS: 500.0000 mg | ORAL_TABLET | Freq: Two times a day (BID) | ORAL | 1 refills | Status: DC

## 2022-10-04 MED ORDER — TRAZODONE HCL 100 MG PO TABS: 100.0000 mg | ORAL_TABLET | Freq: Every day | ORAL | 0 refills | Status: DC

## 2022-10-04 MED ORDER — GABAPENTIN 300 MG PO CAPS: 600.0000 mg | ORAL_CAPSULE | Freq: Four times a day (QID) | ORAL | 1 refills | Status: DC

## 2022-10-04 MED ORDER — LEVETIRACETAM 500 MG PO TABS
500.0000 mg | ORAL_TABLET | Freq: Two times a day (BID) | ORAL | Status: DC
  Administered 2022-10-04: 500 mg via ORAL
  Filled 2022-10-04: qty 1

## 2022-10-04 NOTE — Progress Notes (Incomplete)
PROGRESS NOTE    Marie Myers  [id number] DOB: [birth year] DOA: 10/03/2022 PCP: Nancie Neas, FNP   Brief Narrative:  ***   Assessment & Plan:   Principal Problem:   Seizures ([place]) Active Problems:   Alcohol use   Anxiety   Insomnia   Leukocytosis   *** Acute breakthrough seizure, questionably provoked in the setting of alcohol use/abuse Rule out Etoh withdrawal*** - Appreciate neurology evaluation and recommendations  - She was given 1500 mg IV Keppra and 1 mg IV Ativan in ED  - Continue seizure precautions, continue gabapentin, check EEG, correct hypomagnesemia     Anxiety, insomnia  - Continue gabapentin as advised by neurology but hold other potentially sedating medications for now until she is more alert     Alcohol dependence  - Pt somnolent and not participating in interview at time of admission; she had denied excessive alcohol use to ED physician but has hx of alcohol abuse documented by psychiatry previously  - Monitor with CIWA for now, use Ativan if needed, and supplement vitamins     Leukemoid reaction  -Does not meet sepsis criteria - no infection suspected at this time*** - WBC is 14,900 on admission without fever or apparent infection   DVT prophylaxis: *** Code Status: *** Family Communication: ***  Status is: ***  Dispo: The patient is from: ***              Anticipated d/c is to: ***              Anticipated d/c date is: ***              Patient currently *** medically stable for discharge  Consultants:  ***  Procedures:  ***  Antimicrobials:  ***   Subjective: ***  Objective: Vitals:   10/03/22 2015 10/03/22 2112 10/03/22 2240 10/04/22 0500  BP: 111/68  (!) 127/91 118/76  Pulse: 82 77 67 72  Resp: 15  18 18  $ Temp: 98.3 F (36.8 C)  98.2 F (36.8 C) 97.9 F (36.6 C)  TempSrc: Oral  Oral Oral  SpO2: 99%  100% 100%  Weight:   92.5 kg   Height:   5' 6"$  (1.676 m)     Intake/Output Summary (Last 24 hours) at 10/04/2022  0751 Last data filed at 10/03/2022 1828 Gross per 24 hour  Intake 1150 ml  Output --  Net 1150 ml   Filed Weights   10/03/22 1231 10/03/22 2240  Weight: 89.8 kg 92.5 kg    Examination:  General exam: Appears calm and comfortable  Respiratory system: Clear to auscultation. Respiratory effort normal. Cardiovascular system: S1 & S2 heard, RRR. No JVD, murmurs, rubs, gallops or clicks. No pedal edema. Gastrointestinal system: Abdomen is nondistended, soft and nontender. No organomegaly or masses felt. Normal bowel sounds heard. Central nervous system: Alert and oriented. No focal neurological deficits. Extremities: Symmetric 5 x 5 power. Skin: No rashes, lesions or ulcers Psychiatry: Judgement and insight appear normal. Mood & affect appropriate.     Data Reviewed: I have personally reviewed following labs and imaging studies  CBC: Recent Labs  Lab 10/03/22 1314  WBC 14.9*  NEUTROABS 13.7*  HGB 12.0  HCT 36.0  MCV 104.7*  PLT 0000000   Basic Metabolic Panel: Recent Labs  Lab 10/03/22 1314  NA 135  K 3.7  CL 99  CO2 23  GLUCOSE 136*  BUN <5*  CREATININE 0.79  CALCIUM 8.8*  MG 1.5*   GFR: Estimated Creatinine Clearance:  116.8 mL/min (by C-G formula based on SCr of 0.79 mg/dL). Liver Function Tests: Recent Labs  Lab 10/03/22 1314  AST 26  ALT 12  ALKPHOS 61  BILITOT 1.1  PROT 6.4*  ALBUMIN 3.6   No results for input(s): "LIPASE", "AMYLASE" in the last 168 hours. No results for input(s): "AMMONIA" in the last 168 hours. Coagulation Profile: No results for input(s): "INR", "PROTIME" in the last 168 hours. Cardiac Enzymes: No results for input(s): "CKTOTAL", "CKMB", "CKMBINDEX", "TROPONINI" in the last 168 hours. BNP (last 3 results) No results for input(s): "PROBNP" in the last 8760 hours. HbA1C: No results for input(s): "HGBA1C" in the last 72 hours. CBG: Recent Labs  Lab 10/03/22 1445 10/03/22 1941  GLUCAP 120* 114*   Lipid Profile: No results  for input(s): "CHOL", "HDL", "LDLCALC", "TRIG", "CHOLHDL", "LDLDIRECT" in the last 72 hours. Thyroid Function Tests: No results for input(s): "TSH", "T4TOTAL", "FREET4", "T3FREE", "THYROIDAB" in the last 72 hours. Anemia Panel: No results for input(s): "VITAMINB12", "FOLATE", "FERRITIN", "TIBC", "IRON", "RETICCTPCT" in the last 72 hours. Sepsis Labs: No results for input(s): "PROCALCITON", "LATICACIDVEN" in the last 168 hours.  No results found for this or any previous visit (from the past 240 hour(s)).       Radiology Studies: Overnight EEG with video  Result Date: 10/04/2022 Lora Havens, MD     10/04/2022  4:09 AM Patient Name: Marie Myers MRN: [id number] Epilepsy Attending: Lora Havens Referring Physician/Provider: Lora Havens, MD Duration: 10/03/2022 2217 to 10/04/2022 0400  Patient history: 31yo f with breakthrough seizure, not back to baseline. EEG to evaluate for status, subclinical seizure.  Level of alertness: Awake, asleep  AEDs during EEG study: Ativan, Keppra  Technical aspects: This EEG study was done with scalp electrodes positioned according to the 10-20 International system of electrode placement. Electrical activity was reviewed with band pass filter of 1-70Hz$ , sensitivity of 7 uV/mm, display speed of 53m/sec with a 60Hz$  notched filter applied as appropriate. EEG data were recorded continuously and digitally stored.  Video monitoring was available and reviewed as appropriate.  Description: The posterior dominant rhythm consists of 8 Hz activity of moderate voltage (25-35 uV) seen predominantly in posterior head regions, symmetric and reactive to eye opening and eye closing. Sleep was characterized by vertex waves, sleep spindles (12 to 14 Hz), maximal frontocentral region. Hyperventilation and photic stimulation were not performed.    IMPRESSION: This study is within normal limits. No seizures or definite epileptiform discharges were seen throughout the recording.   PLora Havens  EEG adult  Result Date: 10/03/2022 YLora Havens MD     10/04/2022  4:13 AM Patient Name: Marie BuchanMRN: [id number] Attending: PLora HavensReferring Physician/Provider: OVianne Bulls MD Date: 2/102024 Duration: 23.34 mins Patient history: 31yo f with breakthrough seizure, not back to baseline. EEG to evaluate for status, subclinical seizure. Level of alertness: Awake, asleep AEDs during EEG study: Ativan, Keppra Technical aspects: This EEG study was done with scalp electrodes positioned according to the 10-20 International system of electrode placement. Electrical activity was reviewed with band pass filter of 1-70Hz$ , sensitivity of 7 uV/mm, display speed of 381msec with a 60Hz$  notched filter applied as appropriate. EEG data were recorded continuously and digitally stored.  Video monitoring was available and reviewed as appropriate. Description: The posterior dominant rhythm consists of 7 Hz activity of moderate voltage (25-35 uV) seen predominantly in posterior head regions, symmetric and reactive to eye opening and eye closing. Sleep  was characterized by vertex waves, sleep spindles (12 to 14 Hz), maximal frontocentral region. Frequent sharp transients were noted in right>left frontal region, predominantly in sleep.  Hyperventilation and photic stimulation were not performed.   ABNORMALITY - background slow IMPRESSION: This study is suggestive of mild diffuse encephalopathy, could be secondary to post-ictal state. No seizures were seen throughout the recording. Frequent sharp transients were noted in right>left frontal region, predominantly in sleep which could be epileptiform. Recommend long term monitoring for further characterization. Lora Havens   CT Head Wo Contrast  Result Date: 10/03/2022 CLINICAL DATA:  [place] out of bed.  Blunt head trauma.  Headache. EXAM: CT HEAD WITHOUT CONTRAST TECHNIQUE: Contiguous axial images were obtained from the base of the  skull through the vertex without intravenous contrast. RADIATION DOSE REDUCTION: This exam was performed according to the departmental dose-optimization program which includes automated exposure control, adjustment of the mA and/or kV according to patient size and/or use of iterative reconstruction technique. COMPARISON:  08/22/2022 FINDINGS: Brain: No evidence of intracranial hemorrhage, acute infarction, hydrocephalus, extra-axial collection, or mass lesion/mass effect. Vascular:  No hyperdense vessel or other acute findings. Skull: No evidence of fracture or other significant bone abnormality. Sinuses/Orbits:  No acute findings. Other: None. IMPRESSION: Negative noncontrast head CT. Electronically Signed   By: Marlaine Hind M.D.   On: 10/03/2022 17:02        Scheduled Meds:  enoxaparin (LOVENOX) injection  40 mg Subcutaneous [id number]   folic acid  1 mg Oral Daily   gabapentin  600 mg Oral TID   LORazepam  0-4 mg Oral Q6H   Followed by   Derrill Memo ON 10/05/2022] LORazepam  0-4 mg Oral Q12H   multivitamin with minerals  1 tablet Oral Daily   sodium chloride flush  3 mL Intravenous Q12H   thiamine  100 mg Oral Daily   Or   thiamine  100 mg Intravenous Daily   Continuous Infusions:  levETIRAcetam 750 mg (10/04/22 0446)   magnesium sulfate bolus IVPB       LOS: 0 days    Time spent: ***    Little Ishikawa, DO [facility]  If 7PM-7AM, please contact night-coverage [email or web address]  10/04/2022, 7:51 AM

## 2022-10-04 NOTE — Progress Notes (Signed)
LTM EEG discontinued - no skin breakdown at unhook.   

## 2022-10-04 NOTE — Discharge Summary (Signed)
Physician Discharge Summary  Marie Myers [id number] DOB: [birth year] DOA: 10/03/2022  PCP: Nancie Neas, FNP  Admit date: 10/03/2022 Discharge date: 10/04/2022  Admitted From: Home Disposition:  Home  Recommendations for Outpatient Follow-up:  Follow up with PCP in 1-2 weeks Please obtain BMP/CBC/Mg in one week  Home Health: None Equipment/Devices: None  Discharge Condition: Stable CODE STATUS: Full Diet recommendation: Low-salt low-fat diet  Brief/Interim Summary: Marie Myers is a 32 y.o. female with medical history significant for anxiety, insomnia, alcohol abuse, and prior seizure who presents to the emergency department after having 2 witnessed seizures at home.   Patient admitted as above with neurology consult, EEG initially concern for sharp waves but after long-term EEG this appears to have been artifactual.  No further episodes or issues at this time, patient's medications adjusted as below by neurology, there appears to be some confusion about patient's home med regimen, we recommend following medication regimen as below given discussion with neurology.  Close follow-up with PCP in the next 1 to 2 weeks as scheduled, neurology follow-up per their schedule.   Discharge Diagnoses:  Principal Problem:   Seizures ([place]) Active Problems:   Alcohol use   Anxiety   Insomnia   Leukocytosis  Acute seizure-like activity questionably provoked in the setting of alcohol use/abuse, resolved Etoh withdrawal ruled out - Appreciate neurology evaluation and recommendations  -Continue gabapentin and Keppra as below - Initial EEG with some concern for sharp waves but hooked up to LTM EEG overnight-most likely those sharps were artifactual.  - Unclear etiology but suspect etoh use/abuse as trigger although no signs or symptoms for withdrawal as below - High concern for polypharmacy - decrease trazodone dosing, DC hydroxyzine  Anxiety, insomnia  -Given concern for polypharmacy DC  hydroxyzine and decrease trazodone -Appears to be well controlled on sertraline, olanzapine currently   Alcohol dependence  -Back to baseline per family at bedside -No CIWA protocol thus far triggered - no symptoms -Continue vitamin supplementation   Leukemoid reaction -Does not meet sepsis criteria - no infection suspected at this time -WBC minimally elevated - likely reactive -Repeat labs with PCP as discussed  Hypertension -Normotensive off lisinopril - continue to hold   Discharge Instructions   Allergies as of 10/04/2022       Reactions   Zithromax [azithromycin] Hives        Medication List     STOP taking these medications    hydrOXYzine 25 MG tablet Commonly known as: ATARAX   lisinopril 5 MG tablet Commonly known as: ZESTRIL       TAKE these medications    cyanocobalamin 1000 MCG tablet Take 1 tablet (1,000 mcg total) by mouth daily.   folic acid 1 MG tablet Commonly known as: FOLVITE Take 1 tablet (1 mg total) by mouth daily. Start taking on: October 05, 2022   gabapentin 300 MG capsule Commonly known as: NEURONTIN Take 2 capsules (600 mg total) by mouth 4 (four) times daily.   guanFACINE 1 MG tablet Commonly known as: TENEX Take 1 tablet (1 mg total) by mouth at bedtime.   levETIRAcetam 500 MG tablet Commonly known as: KEPPRA Take 1 tablet (500 mg total) by mouth 2 (two) times daily.   multivitamin with minerals Tabs tablet Take 1 tablet by mouth daily. Start taking on: October 05, 2022   OLANZapine 15 MG tablet Commonly known as: ZYPREXA Take 1 tablet (15 mg total) by mouth at bedtime.   ondansetron 4 MG disintegrating tablet Commonly known as: Zofran  ODT Take 1 tablet (4 mg total) by mouth every 8 (eight) hours as needed for nausea or vomiting.   sertraline 100 MG tablet Commonly known as: ZOLOFT Take 2 tablets (200 mg total) by mouth daily.   sucralfate 1 GM/10ML suspension Commonly known as: Carafate Take 10 mLs (1 g  total) by mouth 4 (four) times daily -  with meals and at bedtime.   thiamine 100 MG tablet Commonly known as: VITAMIN B1 Take 1 tablet (100 mg total) by mouth daily.   thiamine 100 MG tablet Commonly known as: Vitamin B-1 Take 1 tablet (100 mg total) by mouth daily. Start taking on: October 05, 2022   traZODone 100 MG tablet Commonly known as: DESYREL Take 1 tablet (100 mg total) by mouth at bedtime. What changed: how much to take   Vitamin D (Ergocalciferol) 1.25 MG (50000 UNIT) Caps capsule Commonly known as: DRISDOL Take 50,000 Units by mouth every 7 (seven) days. Every tuesday   Vivitrol 380 MG Susr Generic drug: Naltrexone Inject 380 mg into the muscle every 28 (twenty-eight) days.        Allergies  Allergen Reactions   Zithromax [Azithromycin] Hives    Consultations: Neuro   Procedures/Studies: Overnight EEG with video  Result Date: 10/04/2022 Lora Havens, MD     10/04/2022  4:09 AM Patient Name: Marie Myers MRN: [id number] Epilepsy Attending: Lora Havens Referring Physician/Provider: Lora Havens, MD Duration: 10/03/2022 2217 to 10/04/2022 0400  Patient history: 31yo f with breakthrough seizure, not back to baseline. EEG to evaluate for status, subclinical seizure.  Level of alertness: Awake, asleep  AEDs during EEG study: Ativan, Keppra  Technical aspects: This EEG study was done with scalp electrodes positioned according to the 10-20 International system of electrode placement. Electrical activity was reviewed with band pass filter of 1-70Hz$ , sensitivity of 7 uV/mm, display speed of 78m/sec with a 60Hz$  notched filter applied as appropriate. EEG data were recorded continuously and digitally stored.  Video monitoring was available and reviewed as appropriate.  Description: The posterior dominant rhythm consists of 8 Hz activity of moderate voltage (25-35 uV) seen predominantly in posterior head regions, symmetric and reactive to eye opening and eye  closing. Sleep was characterized by vertex waves, sleep spindles (12 to 14 Hz), maximal frontocentral region. Hyperventilation and photic stimulation were not performed.    IMPRESSION: This study is within normal limits. No seizures or definite epileptiform discharges were seen throughout the recording.  PLora Havens  EEG adult  Result Date: 10/03/2022 YLora Havens MD     10/04/2022  4:13 AM Patient Name: OKynna EalyMRN: [id number] Attending: PLora HavensReferring Physician/Provider: OVianne Bulls MD Date: 2/102024 Duration: 23.34 mins Patient history: 31yo f with breakthrough seizure, not back to baseline. EEG to evaluate for status, subclinical seizure. Level of alertness: Awake, asleep AEDs during EEG study: Ativan, Keppra Technical aspects: This EEG study was done with scalp electrodes positioned according to the 10-20 International system of electrode placement. Electrical activity was reviewed with band pass filter of 1-70Hz$ , sensitivity of 7 uV/mm, display speed of 356msec with a 60Hz$  notched filter applied as appropriate. EEG data were recorded continuously and digitally stored.  Video monitoring was available and reviewed as appropriate. Description: The posterior dominant rhythm consists of 7 Hz activity of moderate voltage (25-35 uV) seen predominantly in posterior head regions, symmetric and reactive to eye opening and eye closing. Sleep was characterized by vertex waves, sleep spindles (12  to 14 Hz), maximal frontocentral region. Frequent sharp transients were noted in right>left frontal region, predominantly in sleep.  Hyperventilation and photic stimulation were not performed.   ABNORMALITY - background slow IMPRESSION: This study is suggestive of mild diffuse encephalopathy, could be secondary to post-ictal state. No seizures were seen throughout the recording. Frequent sharp transients were noted in right>left frontal region, predominantly in sleep which could be  epileptiform. Recommend long term monitoring for further characterization. Lora Havens   CT Head Wo Contrast  Result Date: 10/03/2022 CLINICAL DATA:  [place] [address] out of bed.  Blunt head trauma.  Headache. EXAM: CT HEAD WITHOUT CONTRAST TECHNIQUE: Contiguous axial images were obtained from the base of the skull through the vertex without intravenous contrast. RADIATION DOSE REDUCTION: This exam was performed according to the departmental dose-optimization program which includes automated exposure control, adjustment of the mA and/or kV according to patient size and/or use of iterative reconstruction technique. COMPARISON:  08/22/2022 FINDINGS: Brain: No evidence of intracranial hemorrhage, acute infarction, hydrocephalus, extra-axial collection, or mass lesion/mass effect. Vascular:  No hyperdense vessel or other acute findings. Skull: No evidence of fracture or other significant bone abnormality. Sinuses/Orbits:  No acute findings. Other: None. IMPRESSION: Negative noncontrast head CT. Electronically Signed   By: Marlaine Hind M.D.   On: 10/03/2022 17:02     Subjective: No acute issues/events overnight   Discharge Exam: Vitals:   10/04/22 0500 10/04/22 0800  BP: 118/76 126/80  Pulse: 72 89  Resp: 18 18  Temp: 97.9 F (36.6 C) 99 F (37.2 C)  SpO2: 100% 100%   Vitals:   10/03/22 2112 10/03/22 2240 10/04/22 0500 10/04/22 0800  BP:  (!) 127/91 118/76 126/80  Pulse: 77 67 72 89  Resp:  18 18 18  $ Temp:  98.2 F (36.8 C) 97.9 F (36.6 C) 99 F (37.2 C)  TempSrc:  Oral Oral Oral  SpO2:  100% 100% 100%  Weight:  92.5 kg    Height:  5' 6"$  (1.676 m)     General: Pt is alert, awake, not in acute distress Cardiovascular: RRR, S1/S2 +, no rubs, no gallops Respiratory: CTA bilaterally, no wheezing, no rhonchi Abdominal: Soft, NT, ND, bowel sounds + Extremities: no edema, no cyanosis  The results of significant diagnostics from this hospitalization (including imaging, microbiology, ancillary  and laboratory) are listed below for reference.     Microbiology: No results found for this or any previous visit (from the past 240 hour(s)).   Labs:  Basic Metabolic Panel: Recent Labs  Lab 10/03/22 1314  NA 135  K 3.7  CL 99  CO2 23  GLUCOSE 136*  BUN <5*  CREATININE 0.79  CALCIUM 8.8*  MG 1.5*   Liver Function Tests: Recent Labs  Lab 10/03/22 1314  AST 26  ALT 12  ALKPHOS 61  BILITOT 1.1  PROT 6.4*  ALBUMIN 3.6    CBC: Recent Labs  Lab 10/03/22 1314 10/04/22 1106  WBC 14.9* 13.4*  NEUTROABS 13.7*  --   HGB 12.0 10.9*  HCT 36.0 31.7*  MCV 104.7* 102.6*  PLT 369 330   CBG: Recent Labs  Lab 10/03/22 1445 10/03/22 1941  GLUCAP 120* 114*   Urinalysis    Component Value Date/Time   COLORURINE AMBER (A) 01/12/2022 1747   APPEARANCEUR HAZY (A) 01/12/2022 1747   LABSPEC 1.018 01/12/2022 1747   PHURINE 5.0 01/12/2022 1747   GLUCOSEU NEGATIVE 01/12/2022 1747   HGBUR NEGATIVE 01/12/2022 1747   [place] 01/12/2022 1747  KETONESUR 5 (A) 01/12/2022 1747   PROTEINUR NEGATIVE 01/12/2022 1747   NITRITE NEGATIVE 01/12/2022 1747   LEUKOCYTESUR NEGATIVE 01/12/2022 1747   Sepsis Labs Recent Labs  Lab 10/03/22 1314 10/04/22 1106  WBC 14.9* 13.4*   Microbiology No results found for this or any previous visit (from the past 240 hour(s)).   Time coordinating discharge: Over 30 minutes  SIGNED:   Little Ishikawa, DO [facility] 10/04/2022, 12:04 PM Pager   If 7PM-7AM, please contact night-coverage [email or web address]

## 2022-10-04 NOTE — Progress Notes (Signed)
Neurology Progress Note   S:// EEG with some concern for sharp waves but hooked up to LTM EEG overnight-most likely those sharps were artifactual. No epileptiform activity or seizures seen on EEG. Patient back to her baseline-awake alert oriented x 3 at this time. Keppra dose was increased overnight given the concern for # EEG.  Patient able to provide more and reliable history now.  She reports taking 600 mg gabapentin 2 tablets for a total of 1200 mg of Neurontin 2 times a day for a total daily dose of 2400 mg.  Her outpatient note and her discharge neurology instructions were to take 600 mg of Neurontin 3 times daily.  She seems to be confused about her doses.   O:// Current vital signs: BP 118/76 (BP Location: Left Arm)   Pulse 72   Temp 97.9 F (36.6 C) (Oral)   Resp 18   Ht 5' 6"$  (1.676 m)   Wt 92.5 kg   SpO2 100%   BMI 32.91 kg/m  Vital signs in last 24 hours: Temp:  [97.9 F (36.6 C)-98.4 F (36.9 C)] 97.9 F (36.6 C) (02/11 0500) Pulse Rate:  [67-115] 72 (02/11 0500) Resp:  [15-24] 18 (02/11 0500) BP: (110-144)/(67-102) 118/76 (02/11 0500) SpO2:  [95 %-100 %] 100 % (02/11 0500) Weight:  [89.8 kg-92.5 kg] 92.5 kg (02/10 2240) General: Awake alert in no distress HEENT: Left eyebrow laceration, tongue bite. CVS: Regular rhythm Respiratory: Clear Neurological exam Awake alert oriented x 3 No dysarthria No aphasia Cranial nerves II to XII intact Motor examination with no drift in any of the 4 extremities Sensation intact light touch Coordination exam with no dysmetria Gait testing deferred DTRs 2+  Medications  Current Facility-Administered Medications:    acetaminophen (TYLENOL) tablet 650 mg, 650 mg, Oral, Q6H PRN **OR** acetaminophen (TYLENOL) suppository 650 mg, 650 mg, Rectal, Q6H PRN, Opyd, Ilene Qua, MD   enoxaparin (LOVENOX) injection 40 mg, 40 mg, Subcutaneous, Q24H, Opyd, Ilene Qua, MD, 40 mg at [id number]   folic acid (FOLVITE) tablet 1 mg, 1  mg, Oral, Daily, Opyd, Ilene Qua, MD   gabapentin (NEURONTIN) capsule 600 mg, 600 mg, Oral, TID, Opyd, Ilene Qua, MD, 600 mg at 10/04/22 0532   levETIRAcetam (KEPPRA) 750 mg in sodium chloride 0.9 % 100 mL IVPB, 750 mg, Intravenous, Q12H, Bhagat, Srishti L, MD, Last Rate: 430 mL/hr at 10/04/22 0446, 750 mg at 10/04/22 0446   LORazepam (ATIVAN) tablet 1-4 mg, 1-4 mg, Oral, Q1H PRN **OR** LORazepam (ATIVAN) injection 1-4 mg, 1-4 mg, Intravenous, Q1H PRN, Opyd, Ilene Qua, MD   LORazepam (ATIVAN) tablet 0-4 mg, 0-4 mg, Oral, Q6H **FOLLOWED BY** [START ON 10/05/2022] LORazepam (ATIVAN) tablet 0-4 mg, 0-4 mg, Oral, Q12H, Opyd, Timothy S, MD   magnesium sulfate IVPB 1 g 100 mL, 1 g, Intravenous, Once, Opyd, Ilene Qua, MD   multivitamin with minerals tablet 1 tablet, 1 tablet, Oral, Daily, Opyd, Ilene Qua, MD   ondansetron (ZOFRAN) tablet 4 mg, 4 mg, Oral, Q6H PRN **OR** ondansetron (ZOFRAN) injection 4 mg, 4 mg, Intravenous, Q6H PRN, Opyd, Ilene Qua, MD   polyethylene glycol (MIRALAX / GLYCOLAX) packet 17 g, 17 g, Oral, Daily PRN, Opyd, Ilene Qua, MD   sodium chloride flush (NS) 0.9 % injection 3 mL, 3 mL, Intravenous, Q12H, Opyd, Timothy S, MD, 3 mL at 10/04/22 0024   thiamine (VITAMIN B1) tablet 100 mg, 100 mg, Oral, Daily **OR** thiamine (VITAMIN B1) injection 100 mg, 100 mg, Intravenous, Daily, Opyd, Ilene Qua, MD  Labs CBC    Component Value Date/Time   WBC 14.9 (H) 10/03/2022 1314   RBC 3.44 (L) 10/03/2022 1314   HGB 12.0 10/03/2022 1314   HCT 36.0 10/03/2022 1314   PLT 369 10/03/2022 1314   MCV 104.7 (H) 10/03/2022 1314   MCH 34.9 (H) 10/03/2022 1314   MCHC 33.3 10/03/2022 1314   RDW 15.3 10/03/2022 1314   LYMPHSABS 0.4 (L) 10/03/2022 1314   MONOABS 0.7 10/03/2022 1314   EOSABS 0.0 10/03/2022 1314   BASOSABS 0.1 10/03/2022 1314    CMP     Component Value Date/Time   NA 135 10/03/2022 1314   K 3.7 10/03/2022 1314   CL 99 10/03/2022 1314   CO2 23 10/03/2022 1314   GLUCOSE 136 (H)  10/03/2022 1314   BUN <5 (L) 10/03/2022 1314   CREATININE 0.79 10/03/2022 1314   CALCIUM 8.8 (L) 10/03/2022 1314   PROT 6.4 (L) 10/03/2022 1314   ALBUMIN 3.6 10/03/2022 1314   AST 26 10/03/2022 1314   ALT 12 10/03/2022 1314   ALKPHOS 61 10/03/2022 1314   BILITOT 1.1 10/03/2022 1314   GFRNONAA >60 10/03/2022 1314   GFRAA >60 12/27/2019 0810     Imaging I have reviewed images in epic and the results pertinent to this consultation are: No new imaging to review  Assessment:  32 year old past history of anxiety, depression, marijuana use, alcohol abuse, seen for seizure disorder in December, likely noncompliant or not taking her home medications as prescribed presented with 2 seizures at home and probably had a third seizure in the emergency room. Initial EEG with some concern for sharp waves but on LTM EEG it seems that those were artifactual. She is back to baseline Last use of alcohol was 3 days ago.  Reports drinking 3 cups of wine every day. At this point, most likely alcohol withdrawal seizures but she has had them on multiple times and will require antiepileptics Given the fact that she is probably maxed out on her gabapentin the way she is taking it, I would continue her on Keppra as an AED  Recommendations: Continue home gabapentin at the dose prescribed by psychiatry Continue psychiatric medications as prescribed by psychiatry For the seizures, other than the gabapentin, I would recommend Keppra 500 twice daily. Maintain seizure precautions-discussed [place] law and other seizure precautions in detail as documented below. Patient and husband verbalized understanding of seizure precautions. Outpatient neurology follow-up with Dr. April Manson at [facility] in 4 weeks.  Plan was relayed via secure chat to Dr. Avon Gully -- Amie Portland, MD Neurologist [facility] Pager: [phone]

## 2022-10-04 NOTE — Procedures (Addendum)
Patient Name: Marie Myers  MRN: [id number]  Epilepsy Attending: Lora Havens  Referring Physician/Provider: Lora Havens, MD  Duration: 10/03/2022 2217 to 10/04/2022 1153   Patient history: 32yo f with breakthrough seizure, not back to baseline. EEG to evaluate for status, subclinical seizure.   Level of alertness: Awake, asleep   AEDs during EEG study: Ativan, Keppra   Technical aspects: This EEG study was done with scalp electrodes positioned according to the 10-20 International system of electrode placement. Electrical activity was reviewed with band pass filter of 1-70Hz$ , sensitivity of 7 uV/mm, display speed of 75m/sec with a 60Hz$  notched filter applied as appropriate. EEG data were recorded continuously and digitally stored.  Video monitoring was available and reviewed as appropriate.   Description: The posterior dominant rhythm consists of 8 Hz activity of moderate voltage (25-35 uV) seen predominantly in posterior head regions, symmetric and reactive to eye opening and eye closing. Sleep was characterized by vertex waves, sleep spindles (12 to 14 Hz), maximal frontocentral region. Hyperventilation and photic stimulation were not performed.       IMPRESSION: This study is within normal limits. No seizures or definite epileptiform discharges were seen throughout the recording.   Abbie Berling OBarbra Sarks

## 2022-10-04 NOTE — Plan of Care (Signed)
Adm today for 2 witnessed seizures at home. Pt drinks Wine every day but hasn't had a drink in 3 days. Pt has a seizure hx. Alert and oriented to name, place, and situation,

## 2022-12-23 ENCOUNTER — Encounter: Payer: Self-pay | Admitting: Gastroenterology

## 2023-01-22 ENCOUNTER — Encounter (HOSPITAL_COMMUNITY): Payer: Self-pay

## 2023-01-22 ENCOUNTER — Telehealth (INDEPENDENT_AMBULATORY_CARE_PROVIDER_SITE_OTHER): Payer: Self-pay | Admitting: Psychiatry

## 2023-01-22 DIAGNOSIS — Z91199 Patient's noncompliance with other medical treatment and regimen due to unspecified reason: Secondary | ICD-10-CM

## 2023-01-22 NOTE — Progress Notes (Signed)
Logged into [employer] app for patient's video visit and waited for 10 minutes but patient didn't show up. Tried calling patient on the phone number provided in the chart but was unsuccessful.  Nayelly Laughman, MD PGY3 Psychiatry Resident  [place]    

## 2023-01-23 ENCOUNTER — Encounter (HOSPITAL_COMMUNITY): Payer: Self-pay | Admitting: Psychiatry

## 2023-01-29 ENCOUNTER — Telehealth (HOSPITAL_COMMUNITY): Payer: Self-pay

## 2023-01-29 ENCOUNTER — Telehealth (INDEPENDENT_AMBULATORY_CARE_PROVIDER_SITE_OTHER): Payer: Medicaid Other | Admitting: Psychiatry

## 2023-01-29 DIAGNOSIS — F1210 Cannabis abuse, uncomplicated: Secondary | ICD-10-CM

## 2023-01-29 DIAGNOSIS — F1094 Alcohol use, unspecified with alcohol-induced mood disorder: Secondary | ICD-10-CM

## 2023-01-29 DIAGNOSIS — F411 Generalized anxiety disorder: Secondary | ICD-10-CM | POA: Diagnosis not present

## 2023-01-29 DIAGNOSIS — F1090 Alcohol use, unspecified, uncomplicated: Secondary | ICD-10-CM

## 2023-01-29 DIAGNOSIS — D513 Other dietary vitamin B12 deficiency anemia: Secondary | ICD-10-CM

## 2023-01-29 MED ORDER — TRAZODONE HCL 100 MG PO TABS: 100.0000 mg | ORAL_TABLET | Freq: Every day | ORAL | 1 refills | Status: DC

## 2023-01-29 MED ORDER — RISPERIDONE 1 MG PO TABS: ORAL_TABLET | ORAL | 0 refills | Status: DC

## 2023-01-29 MED ORDER — SERTRALINE HCL 100 MG PO TABS: ORAL_TABLET | ORAL | 0 refills | Status: DC

## 2023-01-29 NOTE — Progress Notes (Signed)
[facility] MD/PA/NP OP Progress Note Virtual Visit via Video Note  I connected with Marie Myers on 01/29/23 at 11:00 AM EDT by a video enabled telemedicine application and verified that I am speaking with the correct person using two identifiers.  Location: Patient: Home Provider: Clinic   I discussed the limitations of evaluation and management by telemedicine and the availability of in person appointments. The patient expressed understanding and agreed to proceed.   I discussed the assessment and treatment plan with the patient. The patient was provided an opportunity to ask questions and all were answered. The patient agreed with the plan and demonstrated an understanding of the instructions.   The patient was advised to call back or seek an in-person evaluation if the symptoms worsen or if the condition fails to improve as anticipated.  I provided 30 minutes of non-face-to-face time during this encounter.   Marie Ro, MD   01/29/2023 11:15 AM Marie Myers  MRN:  [id number]  Chief Complaint: Follow up                             Meds refills  HPI:  32 y.o. female seen today for follow-up psychiatric evaluation.  She has a psychiatric history of SI, alcohol induced mood disorder, alcohol dependence, marijuana use, and depression.  She is currently managed on Zyprexa 15 mg nightly, trazodone 200 mg nightly as needed, and Zoloft 200 mg daily.  She has been off medication for 2 weeks.    Pt reports that her mood is " not good". She reports feeling depressed, anxious, has racing thoughts and having a lot of dissociation episodes where she feels like she is stuck in a dream.  She reports that she is able to sleep for 6 hours with trazodone but still feels exhausted in the morning.  She reports stable appetite.  Currently, she denies any suicidal ideations, homicidal ideations, auditory and visual hallucinations.  She currently lives with her 32 year old son and fianc.   She drinks alcohol  most days of the week.  She drinks about 3-4 glasses of wine.  She smokes marijuana and vapes daily.  She denies using any other illicit drugs.  Patient feels that her medication was not helping with her symptoms and asking if she can switch to Abilify.  She has never used Abilify in the past but heard that it helps with her symptoms. Discussed cutting on alcohol use and recommend complete cessation of marijuana.  Discussed negative effects of marijuana and regular alcohol use on mood, sleep and anxiety.  Patient states that she is not going to stop marijuana use. She last had seizure episode 2 months ago when her gabapentin was increased to 600 mg 3 times daily by neurology.  She takes Keppra for seizure disorder.   She has been off all psychiatric medication for 2 weeks. Discussed restarting Zoloft and adding risperidone to help with mood stabilization, and sleep.  Patient is alert and oriented x 4, dysphoric, anxious, cooperative, and fully engaged in conversation during the encounter.  Her thought process is linear with coherent speech . She does not appear to be responding to internal/external stimuli . No other concerns noted at this time. Discussed lab results and recommend patient to follow-up with PCP for anemia and talk to neurologist about switching Keppra to some other agent (Lamictal or Depakote) due to psychiatric side effects of Keppra.    Visit Diagnosis:    ICD-10-CM  1. 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 Cannabis abuse  F12.10     4. Alcohol use disorder  F10.90       Past Psychiatric History:  SI, alcohol induced mood disorder, alcohol dependence, marijuana use, and depression Past Medical History:  Past Medical History:  Diagnosis  Date   Anxiety    Depression    H/O gastric bypass    Insomnia    Marijuana use 01/08/2021   Moderate alcohol use disorder (HCC) 01/08/2021    Past Surgical History:  Procedure Laterality Date   CHOLECYSTECTOMY     GASTRIC BYPASS     GASTRIC BYPASS     LAPAROSCOPY N/A 08/11/2019   Procedure: LAPAROSCOPY DIAGNOSTIC;  Surgeon: Harriette Bouillon, MD;  Location: [facility] OR;  Service: General;  Laterality: N/A;    Family Psychiatric History: Sister depression, Father alcohol use  Family History:  Family History  Problem Relation Age of Onset   Diabetes Mother    Diabetes Father     Social History:  Social History   Socioeconomic History   Marital status: Single    Spouse name: Not on file   Number of children: Not on file   Years of education: Not on file   Highest education level: Not on file  Occupational History   Not on file  Tobacco Use   Smoking status: Never   Smokeless tobacco: Never  Vaping Use   Vaping Use: Every day   Substances: Nicotine, Flavoring  Substance and Sexual Activity   Alcohol use: Yes    Alcohol/week: 1.0 standard drink of alcohol    Types: 1 Glasses of wine per week   Drug use: Yes    Types: Marijuana   Sexual activity: Not on file  Other Topics Concern   Not on file  Social History Narrative   Not on file   Social Determinants of Health   Financial Resource Strain: Not on file  Food Insecurity: Not on file  Transportation Needs: Not on file  Physical Activity: Not on file  Stress: Not on file  Social Connections: Not on file    Allergies:  Allergies  Allergen Reactions   Zithromax [Azithromycin] Hives    Metabolic Disorder Labs: Lab Results  Component Value Date   HGBA1C 4.5 (L) 01/12/2022   MPG 82.45 01/12/2022   MPG 88.19 01/08/2021   No results found for: "PROLACTIN" Lab Results  Component Value Date   CHOL 177 01/12/2022   TRIG 42 01/12/2022   HDL 119 01/12/2022   CHOLHDL 1.5 01/12/2022   VLDL 8 01/12/2022    LDLCALC 50 01/12/2022   LDLCALC 38 01/08/2021   Lab Results  Component Value Date   TSH 2.338 01/12/2022   TSH 1.087 01/08/2021    Therapeutic Level Labs: No results found for: "LITHIUM" No results found for: "VALPROATE" No results found for: "CBMZ"  Current Medications: Current Outpatient Medications  Medication Sig Dispense Refill   risperiDONE (RISPERDAL) 1 MG tablet Take 1 tablet (1 mg total) by mouth at bedtime for 7 days, THEN 1 tablet (1 mg total) 2 (two) times daily. 127 tablet 0   folic acid (FOLVITE) 1 MG tablet Take 1 tablet (1 mg total) by mouth daily. 30 tablet  0   gabapentin (NEURONTIN) 300 MG capsule Take 2 capsules (600 mg total) by mouth 4 (four) times daily. 240 capsule 1   guanFACINE (TENEX) 1 MG tablet Take 1 tablet (1 mg total) by mouth at bedtime. 30 tablet 3   levETIRAcetam (KEPPRA) 500 MG tablet Take 1 tablet (500 mg total) by mouth 2 (two) times daily. 60 tablet 1   Multiple Vitamin (MULTIVITAMIN WITH MINERALS) TABS tablet Take 1 tablet by mouth daily. 30 tablet 1   ondansetron (ZOFRAN ODT) 4 MG disintegrating tablet Take 1 tablet (4 mg total) by mouth every 8 (eight) hours as needed for nausea or vomiting. 10 tablet 0   sertraline (ZOLOFT) 100 MG tablet Take 1 tablet (100 mg total) by mouth daily for 7 days, THEN 2 tablets (200 mg total) daily. 127 tablet 0   sucralfate (CARAFATE) 1 GM/10ML suspension Take 10 mLs (1 g total) by mouth 4 (four) times daily -  with meals and at bedtime. 420 mL 0   thiamine (VITAMIN B-1) 100 MG tablet Take 1 tablet (100 mg total) by mouth daily. 30 tablet 1   thiamine 100 MG tablet Take 1 tablet (100 mg total) by mouth daily.     traZODone (DESYREL) 100 MG tablet Take 1 tablet (100 mg total) by mouth at bedtime. 30 tablet 1   vitamin B-12 1000 MCG tablet Take 1 tablet (1,000 mcg total) by mouth daily. 30 tablet 0   Vitamin D, Ergocalciferol, (DRISDOL) 1.25 MG (50000 UNIT) CAPS capsule Take 50,000 Units by mouth every 7 (seven)  days. Every tuesday     VIVITROL 380 MG SUSR Inject 380 mg into the muscle every 28 (twenty-eight) days.     No current facility-administered medications for this visit.     Musculoskeletal: Strength & Muscle Tone:  Unable to assess due to telephone visit Gait & Station:  Unable to assess due to telephone visit Patient leans: N/A  Psychiatric Specialty Exam: Review of Systems  There were no vitals taken for this visit.There is no height or weight on file to calculate BMI.  General Appearance:  casual  Eye Contact:   fair  Speech:  Clear and Coherent and Normal Rate  Volume:  Normal  Mood:  Anxious and Depressed  Affect:  Congruent and Depressed  Thought Process:  Coherent, Goal Directed, and Linear  Orientation:  Full (Time, Place, and Person)  Thought Content: WDL, Logical, and Paranoid Ideation   Suicidal Thoughts:  No  Homicidal Thoughts:  No  Memory:  Immediate;   Good Recent;   Good Remote;   Good  Judgement:  Poor  Insight:  Shallow  Psychomotor Activity:  normal  Concentration:  Concentration: Good and Attention Span: Good  Recall:  Good  Fund of Knowledge: Good  Language: Good  Akathisia:   No  Handed:  Right  AIMS (if indicated): not done  Assets:  Communication Skills Desire for Improvement Financial Resources/Insurance Housing Physical Health Social Support  ADL's:  Intact  Cognition: WNL  Sleep:  Fair   Screenings: AIMS    Flowsheet Row [facility]  AIMS Total Score 0 0      AUDIT    Flowsheet Row Video Visit from 05/28/2022 in [facility] Office Visit from 02/20/2022 in [facility] (Discharged) [facility] Ad[facility] (Discharged[facility]  [facility]  Alcohol Use Disorder Identification Test Final Score (AUDIT) 18 17 8  32      GAD-7    Flowsheet Row Video Visit from 05/28/2022 in [facility] Office Visit from 02/20/2022 in [facility]  Total GAD-7 Score 11 16      PHQ2-9    Flowsheet Row Video Visit from 05/28/2022 in [facility] Office Visit from 02/20/2022 in [facility]  PHQ-2 Total Score 5 5  PHQ-9 Total Score 18 15      Flowsheet Row ED to Hosp-Admission (Discharged) from 10/03/2022 in [facility] from 08/22/2022 in [facility] Emergency Department at [facility] ED from 07/14/2022 in [facility] Emergency Department at [facility]  C-SSRS RISK CATEGORY No Risk No Risk No Risk        Assessment and Plan: 32 year old female seen today for follow-up psychiatric evaluation.  She has a psychiatric history of SI, alcohol induced mood disorder, alcohol dependence, marijuana use, and depression.  She is currently managed on Zyprexa 15 mg nightly, trazodone 200 mg nightly as needed, gabapentin 600 mg 3 times daily, and Zoloft 200 mg daily.  She ran out of medication for 2 weeks and has been off medications.  She is at her current medications are not helpful in controlling her symptoms.  She has been drinking alcohol most days of the week and smoking marijuana daily to control her anxiety.  Recommend cutting down on alcohol use and complete cessation of marijuana.  Recommend following up with PCP due to vitamin B12 deficiency and anemia.   Recommend talking to neurologist for switching Keppra to some other agent due to psychiatric side effect of Keppra.  Will stop olanzapine and start risperidone to help with mood stabilization, insomnia and irritability and dissociation.  Will titrate as tolerated.  Will consider switching Zoloft to some other agent if risperidone does  not help.   1. Generalized anxiety disorder 2. Alcohol-induced mood disorder (HCC)  -Restart sertraline (ZOLOFT) 100 MG tablet; Take 1 tablet (100 mg total) by mouth daily for 7 days, THEN 2 tablets (200 mg total) daily.  Dispense: 127 tablet; Refill: 0 -Stop olanzapine.  She has been off for 2 weeks. -Start risperiDONE (RISPERDAL) 1 MG tablet; Take 1 tablet (1 mg total) by mouth at bedtime for 7 days, THEN 1 tablet (1 mg total) 2 (two) times daily.  Dispense: 127 tablet; Refill: 0 -Restart traZODone (DESYREL) 100 MG tablet; Take 1 tablet (100 mg total) by mouth at bedtime.  Dispense: 30 tablet; Refill: 1.  EKG QTc 345 3. Cannabis abuse Recommend complete cessation  4. Alcohol use disorder Recommend cutting down on alcohol use.  She was recommended to take vitamin supplements due to gastric bypass but she has not been taking it.  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  Follow-up in 8 wks  Transfer of care: Informed patient that provider will be leaving in June and patient 's care will be transferred to another provider beginning July.    Collaboration of Care: Collaboration of Care: Other provider involved in patient's care AEB Neurology and recent hospitalization notes  Patient/Guardian was advised Release of Information must  be obtained prior to any record release in order to collaborate their care with an outside provider. Patient/Guardian was advised if they have not already done so to contact the registration department to sign all necessary forms in order for [place] to release information regarding their care.   Consent: Patient/Guardian gives verbal consent for treatment and assignment of benefits for services provided during this visit.  Patient/Guardian expressed understanding and agreed to proceed.   Marie Ro, MD 01/29/2023, 11:15 AM

## 2023-01-29 NOTE — Telephone Encounter (Signed)
Medication management - A prior authorization was initiated online with coverMyMeds for patient's prescribed Rispeiridone 1 mg, titrating up to 2 times per day and sent to patient's [place] [employer] for review. Decision pending.

## 2023-02-01 DIAGNOSIS — D649 Anemia, unspecified: Secondary | ICD-10-CM | POA: Insufficient documentation

## 2023-02-11 ENCOUNTER — Encounter (HOSPITAL_COMMUNITY): Payer: Self-pay

## 2023-02-11 ENCOUNTER — Emergency Department (HOSPITAL_COMMUNITY): Payer: Medicaid Other

## 2023-02-11 ENCOUNTER — Telehealth (HOSPITAL_COMMUNITY): Payer: Self-pay | Admitting: *Deleted

## 2023-02-11 ENCOUNTER — Emergency Department (HOSPITAL_COMMUNITY)
Admission: EM | Admit: 2023-02-11 | Discharge: 2023-02-11 | Disposition: A | Discharge status: Home / Self Care | Payer: Medicaid Other | Attending: Emergency Medicine | Admitting: Emergency Medicine

## 2023-02-11 DIAGNOSIS — E876 Hypokalemia: Secondary | ICD-10-CM | POA: Insufficient documentation

## 2023-02-11 DIAGNOSIS — R112 Nausea with vomiting, unspecified: Secondary | ICD-10-CM

## 2023-02-11 LAB — CBC WITH DIFFERENTIAL/PLATELET
Abs Immature Granulocytes: 0.03 10*3/uL (ref 0.00–0.07)
Basophils Absolute: 0 10*3/uL (ref 0.0–0.1)
Basophils Relative: 0 %
Eosinophils Absolute: 0 10*3/uL (ref 0.0–0.5)
Eosinophils Relative: 0 %
HCT: 36.8 % (ref 36.0–46.0)
Hemoglobin: 12.6 g/dL (ref 12.0–15.0)
Immature Granulocytes: 0 %
Lymphocytes Relative: 6 %
Lymphs Abs: 0.5 10*3/uL — ABNORMAL LOW (ref 0.7–4.0)
MCH: 35.6 pg — ABNORMAL HIGH (ref 26.0–34.0)
MCHC: 34.2 g/dL (ref 30.0–36.0)
MCV: 104 fL — ABNORMAL HIGH (ref 80.0–100.0)
Monocytes Absolute: 0.5 10*3/uL (ref 0.1–1.0)
Monocytes Relative: 5 %
Neutro Abs: 8.1 10*3/uL — ABNORMAL HIGH (ref 1.7–7.7)
Neutrophils Relative %: 89 %
Platelets: 287 10*3/uL (ref 150–400)
RBC: 3.54 MIL/uL — ABNORMAL LOW (ref 3.87–5.11)
RDW: 15.1 % (ref 11.5–15.5)
WBC: 9.2 10*3/uL (ref 4.0–10.5)
nRBC: 0 % (ref 0.0–0.2)

## 2023-02-11 LAB — COMPREHENSIVE METABOLIC PANEL
ALT: 21 U/L (ref 0–44)
AST: 33 U/L (ref 15–41)
Albumin: 4.2 g/dL (ref 3.5–5.0)
Alkaline Phosphatase: 86 U/L (ref 38–126)
Anion gap: 13 (ref 5–15)
BUN: 6 mg/dL (ref 6–20)
CO2: 23 mmol/L (ref 22–32)
Calcium: 8.7 mg/dL — ABNORMAL LOW (ref 8.9–10.3)
Chloride: 102 mmol/L (ref 98–111)
Creatinine, Ser: 0.63 mg/dL (ref 0.44–1.00)
GFR, Estimated: >60 mL/min (ref >60)
Glucose, Bld: 122 mg/dL — ABNORMAL HIGH (ref 70–99)
Potassium: 3.2 mmol/L — ABNORMAL LOW (ref 3.5–5.1)
Sodium: 138 mmol/L (ref 135–145)
Total Bilirubin: 1.3 mg/dL — ABNORMAL HIGH (ref 0.3–1.2)
Total Protein: 7.5 g/dL (ref 6.5–8.1)

## 2023-02-11 LAB — LIPASE, BLOOD: Lipase: 35 U/L (ref 11–51)

## 2023-02-11 LAB — I-STAT BETA HCG BLOOD, ED (MC, WL, AP ONLY): I-stat hCG, quantitative: <5 m[IU]/mL (ref <5)

## 2023-02-11 LAB — MAGNESIUM: Magnesium: 1.6 mg/dL — ABNORMAL LOW (ref 1.7–2.4)

## 2023-02-11 LAB — TROPONIN I (HIGH SENSITIVITY): Troponin I (High Sensitivity): <2 ng/L (ref <18)

## 2023-02-11 MED ORDER — HYDROMORPHONE HCL 1 MG/ML IJ SOLN
0.5000 mg | Freq: Once | INTRAMUSCULAR | Status: AC
  Administered 2023-02-11: 0.5 mg via INTRAVENOUS
  Filled 2023-02-11: qty 1

## 2023-02-11 MED ORDER — IOHEXOL 300 MG/ML  SOLN
100.0000 mL | Freq: Once | INTRAMUSCULAR | Status: AC | PRN
  Administered 2023-02-11: 100 mL via INTRAVENOUS

## 2023-02-11 MED ORDER — LEVETIRACETAM IN NACL 1500 MG/100ML IV SOLN
1500.0000 mg | Freq: Once | INTRAVENOUS | Status: AC
  Administered 2023-02-11: 1500 mg via INTRAVENOUS
  Filled 2023-02-11: qty 100

## 2023-02-11 MED ORDER — POTASSIUM CHLORIDE 20 MEQ PO PACK: 40.0000 meq | PACK | Freq: Every day | ORAL | 0 refills | Status: DC

## 2023-02-11 MED ORDER — MAGNESIUM OXIDE 400 MG PO CAPS: 400.0000 mg | ORAL_CAPSULE | Freq: Every day | ORAL | 0 refills | Status: AC

## 2023-02-11 MED ORDER — ONDANSETRON 4 MG PO TBDP: 4.0000 mg | ORAL_TABLET | Freq: Three times a day (TID) | ORAL | 0 refills | Status: DC | PRN

## 2023-02-11 MED ORDER — ONDANSETRON HCL 4 MG/2ML IJ SOLN
4.0000 mg | Freq: Once | INTRAMUSCULAR | Status: AC
  Administered 2023-02-11: 4 mg via INTRAVENOUS
  Filled 2023-02-11: qty 2

## 2023-02-11 MED ORDER — METOCLOPRAMIDE HCL 5 MG/ML IJ SOLN
10.0000 mg | Freq: Once | INTRAMUSCULAR | Status: AC
  Administered 2023-02-11: 10 mg via INTRAVENOUS
  Filled 2023-02-11: qty 2

## 2023-02-11 MED ORDER — PROMETHAZINE HCL 25 MG RE SUPP: 25.0000 mg | Freq: Three times a day (TID) | RECTAL | 0 refills | Status: DC | PRN

## 2023-02-11 MED ORDER — SODIUM CHLORIDE 0.9 % IV BOLUS
1000.0000 mL | Freq: Once | INTRAVENOUS | Status: AC
  Administered 2023-02-11: 1000 mL via INTRAVENOUS

## 2023-02-11 NOTE — ED Triage Notes (Signed)
Pt reports NVD x2 days. Feels faint. No fevers, no known sick contacts. OTC meds with no relief

## 2023-02-11 NOTE — ED Provider Notes (Signed)
[place] EMERGENCY DEPARTMENT AT [facility] Provider Note   CSN: [id number] Arrival date & time: 02/11/23  1015     History  Chief Complaint  Patient presents with   Emesis   Nausea   Diarrhea   Chest Pain    Marie Myers is a 32 y.o. female with a history of gastric bypass, seizures, presenting to ED with epigastric pain, nausea, vomiting, inability to tolerate PO food, fluids, or medicine, and diarrhea.  Onset for 2 days  HPI     Home Medications Prior to Admission medications   Medication Sig Start Date End Date Taking? Authorizing Provider  Magnesium Oxide 400 MG CAPS Take 1 capsule (400 mg total) by mouth daily for 15 days. 02/11/23 02/26/23 Yes Dwayn Moravek, Kermit Balo, MD  ondansetron (ZOFRAN-ODT) 4 MG disintegrating tablet Take 1 tablet (4 mg total) by mouth every 8 (eight) hours as needed for up to 15 doses for nausea or vomiting. 02/11/23  Yes Kenry Daubert, Kermit Balo, MD  potassium chloride (KLOR-CON) 20 MEQ packet Take 40 mEq by mouth daily for 15 days. 02/11/23 02/26/23 Yes Marlan Steward, Kermit Balo, MD  promethazine (PHENERGAN) 25 MG suppository Place 1 suppository (25 mg total) rectally every 8 (eight) hours as needed for up to 6 days for nausea or vomiting. 02/11/23 02/17/23 Yes Jayren Cease, Kermit Balo, MD  folic acid (FOLVITE) 1 MG tablet Take 1 tablet (1 mg total) by mouth daily. 10/05/22   Azucena Fallen, MD  gabapentin (NEURONTIN) 300 MG capsule Take 2 capsules (600 mg total) by mouth 4 (four) times daily. 10/04/22   Azucena Fallen, MD  guanFACINE (TENEX) 1 MG tablet Take 1 tablet (1 mg total) by mouth at bedtime. 05/28/22   Shanna Cisco, NP  levETIRAcetam (KEPPRA) 500 MG tablet Take 1 tablet (500 mg total) by mouth 2 (two) times daily. 10/04/22   Azucena Fallen, MD  Multiple Vitamin (MULTIVITAMIN WITH MINERALS) TABS tablet Take 1 tablet by mouth daily. 10/05/22   Azucena Fallen, MD  ondansetron (ZOFRAN ODT) 4 MG disintegrating tablet Take 1 tablet (4 mg  total) by mouth every 8 (eight) hours as needed for nausea or vomiting. 01/19/21   Caccavale, Sophia, PA-C  risperiDONE (RISPERDAL) 1 MG tablet Take 1 tablet (1 mg total) by mouth at bedtime for 7 days, THEN 1 tablet (1 mg total) 2 (two) times daily. 01/29/23 04/06/23  Karsten Ro, MD  sertraline (ZOLOFT) 100 MG tablet Take 1 tablet (100 mg total) by mouth daily for 7 days, THEN 2 tablets (200 mg total) daily. 01/29/23 04/06/23  Karsten Ro, MD  sucralfate (CARAFATE) 1 GM/10ML suspension Take 10 mLs (1 g total) by mouth 4 (four) times daily -  with meals and at bedtime. 01/19/21   Caccavale, Sophia, PA-C  thiamine (VITAMIN B-1) 100 MG tablet Take 1 tablet (100 mg total) by mouth daily. 10/05/22   Azucena Fallen, MD  thiamine 100 MG tablet Take 1 tablet (100 mg total) by mouth daily. 01/14/21   Laveda Abbe, NP  traZODone (DESYREL) 100 MG tablet Take 1 tablet (100 mg total) by mouth at bedtime. 01/29/23   Karsten Ro, MD  vitamin B-12 1000 MCG tablet Take 1 tablet (1,000 mcg total) by mouth daily. 01/14/21   Laveda Abbe, NP  Vitamin D, Ergocalciferol, (DRISDOL) 1.25 MG (50000 UNIT) CAPS capsule Take 50,000 Units by mouth every 7 (seven) days. Every tuesday    [provider]  VIVITROL 380 MG SUSR Inject 380 mg  into the muscle every 28 (twenty-eight) days. 08/21/22   [provider]      Allergies    Zithromax [azithromycin]    Review of Systems   Review of Systems  Physical Exam Updated Vital Signs BP (!) 143/104 (BP Location: Left Arm)   Pulse (!) 110   Temp 98.5 F (36.9 C) (Oral)   Resp 18   Ht 5\' 6"  (1.676 m)   Wt 93 kg   SpO2 97%   BMI 33.09 kg/m  Physical Exam Constitutional:      General: She is not in acute distress. HENT:     Head: Normocephalic and atraumatic.  Eyes:     Conjunctiva/sclera: Conjunctivae normal.     Pupils: Pupils are equal, round, and reactive to light.  Cardiovascular:     Rate and Rhythm: Normal rate and regular  rhythm.  Pulmonary:     Effort: Pulmonary effort is normal. No respiratory distress.  Abdominal:     General: There is no distension.     Tenderness: There is no abdominal tenderness.  Skin:    General: Skin is warm and dry.  Neurological:     General: No focal deficit present.     Mental Status: She is alert. Mental status is at baseline.  Psychiatric:        Mood and Affect: Mood normal.        Behavior: Behavior normal.     ED Results / Procedures / Treatments   Labs (all labs ordered are listed, but only abnormal results are displayed) Labs Reviewed  CBC WITH DIFFERENTIAL/PLATELET - Abnormal; Notable for the following components:      Result Value   RBC 3.54 (*)    MCV 104.0 (*)    MCH 35.6 (*)    Neutro Abs 8.1 (*)    Lymphs Abs 0.5 (*)    All other components within normal limits  COMPREHENSIVE METABOLIC PANEL - Abnormal; Notable for the following components:   Potassium 3.2 (*)    Glucose, Bld 122 (*)    Calcium 8.7 (*)    Total Bilirubin 1.3 (*)    All other components within normal limits  MAGNESIUM - Abnormal; Notable for the following components:   Magnesium 1.6 (*)    All other components within normal limits  LIPASE, BLOOD  LEVETIRACETAM LEVEL  I-STAT BETA HCG BLOOD, ED ([facility], [facility], [facility] ONLY)  TROPONIN I (HIGH SENSITIVITY)    EKG EKG Interpretation  Date/Time:  Thursday February 11 2023 10:24:24 EDT Ventricular Rate:  83 PR Interval:  119 QRS Duration: 82 QT Interval:  486 QTC Calculation: 572 R Axis:   48 Text Interpretation: Sinus arrhythmia Ventricular premature complex Prolonged QT interval Confirmed by Alvester Chou [phone]) on 02/11/2023 10:42:36 AM  Radiology CT ABDOMEN PELVIS W CONTRAST  Result Date: 02/11/2023 CLINICAL DATA:  Abdominal pain, acute. History of gastric bypass surgery and cholecystectomy. Acute diffuse abdominal pain and vomiting. EXAM: CT ABDOMEN AND PELVIS WITH CONTRAST TECHNIQUE: Multidetector CT imaging of the abdomen and  pelvis was performed using the standard protocol following bolus administration of intravenous contrast. RADIATION DOSE REDUCTION: This exam was performed according to the departmental dose-optimization program which includes automated exposure control, adjustment of the mA and/or kV according to patient size and/or use of iterative reconstruction technique. CONTRAST:  OMNIPAQUE IOHEXOL 300 MG/ML  SOLN COMPARISON:  CT examination dated October 15, 2020 FINDINGS: Lower chest: No acute abnormality. Hepatobiliary: No focal liver abnormality is seen. Status post cholecystectomy. No  biliary dilatation. Pancreas: Unremarkable. No pancreatic ductal dilatation or surrounding inflammatory changes. Spleen: Normal in size without focal abnormality. Adrenals/Urinary Tract: Adrenal glands are unremarkable. Kidneys are normal, without renal calculi, focal lesion, or hydronephrosis. Bladder is unremarkable. Stomach/Bowel: Postsurgical changes for prior gastric bypass surgery. Bowel loops are normal in caliber. Appendix appears normal. No evidence of bowel wall thickening, distention, or inflammatory changes. Vascular/Lymphatic: No significant vascular findings are present. No enlarged abdominal or pelvic lymph nodes. Reproductive: Uterus and bilateral adnexa are unremarkable. Other: No abdominal wall hernia or abnormality. No abdominopelvic ascites. Musculoskeletal: No acute or significant osseous findings. IMPRESSION: 1. No CT evidence of acute abdominal/pelvic process. 2. Postsurgical changes for prior gastric bypass surgery. No evidence of bowel obstruction. Electronically Signed   By: Larose Hires D.O.   On: 02/11/2023 13:13   DG Chest 2 View  Result Date: 02/11/2023 CLINICAL DATA:  Left-sided chest pain. EXAM: CHEST - 2 VIEW COMPARISON:  Radiographs 08/22/2022 and 01/19/2021. Chest CT 10/16/2020. FINDINGS: The heart size and mediastinal contours are normal. The lungs are clear. There is no pleural effusion or  pneumothorax. No acute osseous findings are identified. Telemetry leads overlie the chest. IMPRESSION: No active cardiopulmonary process. Electronically Signed   By: Carey Bullocks M.D.   On: 02/11/2023 11:37    Procedures Procedures    Medications Ordered in ED Medications  levETIRAcetam (KEPPRA) IVPB 1500 mg/ 100 mL premix (0 mg Intravenous Stopped 02/11/23 1403)  metoCLOPramide (REGLAN) injection 10 mg (10 mg Intravenous Given 02/11/23 1119)  sodium chloride 0.9 % bolus 1,000 mL (0 mLs Intravenous Stopped 02/11/23 1458)  HYDROmorphone (DILAUDID) injection 0.5 mg (0.5 mg Intravenous Given 02/11/23 1203)  iohexol (OMNIPAQUE) 300 MG/ML solution 100 mL (100 mLs Intravenous Contrast Given 02/11/23 1257)  ondansetron (ZOFRAN) injection 4 mg (4 mg Intravenous Given 02/11/23 1240)    ED Course/ Medical Decision Making/ A&P                             Medical Decision Making Amount and/or Complexity of Data Reviewed Labs: ordered. Radiology: ordered.  Risk OTC drugs. Prescription drug management.   This patient presents to the ED with concern for abdominal pain, vomiting. This involves an extensive number of treatment options, and is a complaint that carries with it a high risk of complications and morbidity.  The differential diagnosis includes cyclical vomiting vs viral gastroenteritis vs colitis vs pancreatitis vs ureteral colic vs other   I ordered and personally interpreted labs.  The pertinent results include:  mild hypoK and hypo Mag, Cr wnl; Wbc wnl; lipase wnl  I ordered imaging studies including dg chest, ct abdomen pelvis I independently visualized and interpreted imaging which showed no emergent findings for patient's symptoms I agree with the radiologist interpretation  The patient was maintained on a cardiac monitor.  I personally viewed and interpreted the cardiac monitored which showed an underlying rhythm of: NSR  Per my interpretation the patient's ECG shows no acute  ischemic findings  I ordered medication for nausea, vomiting, abdominal pain  IV keppra ordered as loading dose as patient has missed 2-3 days oral keppra at home due to vomiting.  No report of seizure activity.  I have reviewed the patients home medicines and have made adjustments as needed  Test Considered: doubt acute PE, doubt ovarian torsion   After the interventions noted above, I reevaluated the patient and found that they have: improved.  Tolerating PO easily.  Dispostion:  After consideration of the diagnostic results and the patients response to treatment, I feel that the patent would benefit from outpatient follow up, electrolyte repletion, nausea control.         Final Clinical Impression(s) / ED Diagnoses Final diagnoses:  Hypokalemia  Hypomagnesemia  Nausea and vomiting, unspecified vomiting type    Rx / DC Orders ED Discharge Orders          Ordered    ondansetron (ZOFRAN-ODT) 4 MG disintegrating tablet  Every 8 hours PRN        02/11/23 1553    promethazine (PHENERGAN) 25 MG suppository  Every 8 hours PRN        02/11/23 1553    Magnesium Oxide 400 MG CAPS  Daily        02/11/23 1553    potassium chloride (KLOR-CON) 20 MEQ packet  Daily       Note to Pharmacy: If unable to afford this form, can offer Kdur tablets 40 mg by mouth daily for 15 days   02/11/23 1553              Ajanee Buren, Kermit Balo, MD 02/11/23 1754

## 2023-02-11 NOTE — Telephone Encounter (Signed)
Patient called stating she had a seizure a few days ago and her Trazodone was on the nightstand. She knocked the nightstand over and her pills disappeared. Says she also knocked over a water bottle and thinks they dissolved. Asking if she can get a refill to bridge her gap until the next fill or write for something she has tried before. Noticed she did not have a follow up appointment so she was transferred to front desk to make an appointment. Wants her pharmacy to be [address] in [place]. [facility].

## 2023-02-12 ENCOUNTER — Other Ambulatory Visit (HOSPITAL_COMMUNITY): Payer: Self-pay | Admitting: Student

## 2023-02-12 DIAGNOSIS — F1094 Alcohol use, unspecified with alcohol-induced mood disorder: Secondary | ICD-10-CM

## 2023-02-12 LAB — LEVETIRACETAM LEVEL: Levetiracetam Lvl: 3.2 ug/mL — ABNORMAL LOW (ref 10.0–40.0)

## 2023-02-12 MED ORDER — TRAZODONE HCL 100 MG PO TABS
100.0000 mg | ORAL_TABLET | Freq: Every day | ORAL | 1 refills | Status: DC
Start: 2023-02-12 — End: 2023-06-01

## 2023-03-11 ENCOUNTER — Ambulatory Visit: Payer: Medicaid Other | Admitting: Gastroenterology

## 2023-03-11 NOTE — Progress Notes (Deleted)
HPI :    CT abdomen/pelvis February 11, 2023 IMPRESSION: 1. No CT evidence of acute abdominal/pelvic process. 2. Postsurgical changes for prior gastric bypass surgery. No evidence of bowel obstruction.  Past Medical History:  Diagnosis Date   Anxiety    Depression    H/O gastric bypass    Insomnia    Marijuana use 01/08/2021   Moderate alcohol use disorder (HCC) 01/08/2021     Past Surgical History:  Procedure Laterality Date   CHOLECYSTECTOMY     GASTRIC BYPASS     GASTRIC BYPASS     LAPAROSCOPY N/A 08/11/2019   Procedure: LAPAROSCOPY DIAGNOSTIC;  Surgeon: Harriette Bouillon, MD;  Location: [facility] OR;  Service: General;  Laterality: N/A;   Family History  Problem Relation Age of Onset   Diabetes Mother    Diabetes Father    Social History   Tobacco Use   Smoking status: Never   Smokeless tobacco: Never  Vaping Use   Vaping status: Every Day   Substances: Nicotine, Flavoring  Substance Use Topics   Alcohol use: Yes    Alcohol/week: 1.0 standard drink of alcohol    Types: 1 Glasses of wine per week   Drug use: Yes    Types: Marijuana   Current Outpatient Medications  Medication Sig Dispense Refill   folic acid (FOLVITE) 1 MG tablet Take 1 tablet (1 mg total) by mouth daily. 30 tablet 0   gabapentin (NEURONTIN) 300 MG capsule Take 2 capsules (600 mg total) by mouth 4 (four) times daily. 240 capsule 1   guanFACINE (TENEX) 1 MG tablet Take 1 tablet (1 mg total) by mouth at bedtime. 30 tablet 3   levETIRAcetam (KEPPRA) 500 MG tablet Take 1 tablet (500 mg total) by mouth 2 (two) times daily. 60 tablet 1   Multiple Vitamin (MULTIVITAMIN WITH MINERALS) TABS tablet Take 1 tablet by mouth daily. 30 tablet 1   ondansetron (ZOFRAN ODT) 4 MG disintegrating tablet Take 1 tablet (4 mg total) by mouth every 8 (eight) hours as needed for nausea or vomiting. 10 tablet 0   ondansetron (ZOFRAN-ODT) 4 MG disintegrating tablet Take 1 tablet (4 mg total) by mouth every 8 (eight) hours as  needed for up to 15 doses for nausea or vomiting. 15 tablet 0   potassium chloride (KLOR-CON) 20 MEQ packet Take 40 mEq by mouth daily for 15 days. 30 packet 0   promethazine (PHENERGAN) 25 MG suppository Place 1 suppository (25 mg total) rectally every 8 (eight) hours as needed for up to 6 days for nausea or vomiting. 6 each 0   risperiDONE (RISPERDAL) 1 MG tablet Take 1 tablet (1 mg total) by mouth at bedtime for 7 days, THEN 1 tablet (1 mg total) 2 (two) times daily. 127 tablet 0   sertraline (ZOLOFT) 100 MG tablet Take 1 tablet (100 mg total) by mouth daily for 7 days, THEN 2 tablets (200 mg total) daily. 127 tablet 0   sucralfate (CARAFATE) 1 GM/10ML suspension Take 10 mLs (1 g total) by mouth 4 (four) times daily -  with meals and at bedtime. 420 mL 0   thiamine (VITAMIN B-1) 100 MG tablet Take 1 tablet (100 mg total) by mouth daily. 30 tablet 1   thiamine 100 MG tablet Take 1 tablet (100 mg total) by mouth daily.     traZODone (DESYREL) 100 MG tablet Take 1 tablet (100 mg total) by mouth at bedtime. 30 tablet 1   vitamin B-12 1000 MCG tablet Take 1  tablet (1,000 mcg total) by mouth daily. 30 tablet 0   Vitamin D, Ergocalciferol, (DRISDOL) 1.25 MG (50000 UNIT) CAPS capsule Take 50,000 Units by mouth every 7 (seven) days. Every tuesday     VIVITROL 380 MG SUSR Inject 380 mg into the muscle every 28 (twenty-eight) days.     No current facility-administered medications for this visit.   Allergies  Allergen Reactions   Zithromax [Azithromycin] Hives     Review of Systems: All systems reviewed and negative except where noted in HPI.    CT ABDOMEN PELVIS W CONTRAST  Result Date: 02/11/2023 CLINICAL DATA:  Abdominal pain, acute. History of gastric bypass surgery and cholecystectomy. Acute diffuse abdominal pain and vomiting. EXAM: CT ABDOMEN AND PELVIS WITH CONTRAST TECHNIQUE: Multidetector CT imaging of the abdomen and pelvis was performed using the standard protocol following bolus  administration of intravenous contrast. RADIATION DOSE REDUCTION: This exam was performed according to the departmental dose-optimization program which includes automated exposure control, adjustment of the mA and/or kV according to patient size and/or use of iterative reconstruction technique. CONTRAST:  OMNIPAQUE IOHEXOL 300 MG/ML  SOLN COMPARISON:  CT examination dated October 15, 2020 FINDINGS: Lower chest: No acute abnormality. Hepatobiliary: No focal liver abnormality is seen. Status post cholecystectomy. No biliary dilatation. Pancreas: Unremarkable. No pancreatic ductal dilatation or surrounding inflammatory changes. Spleen: Normal in size without focal abnormality. Adrenals/Urinary Tract: Adrenal glands are unremarkable. Kidneys are normal, without renal calculi, focal lesion, or hydronephrosis. Bladder is unremarkable. Stomach/Bowel: Postsurgical changes for prior gastric bypass surgery. Bowel loops are normal in caliber. Appendix appears normal. No evidence of bowel wall thickening, distention, or inflammatory changes. Vascular/Lymphatic: No significant vascular findings are present. No enlarged abdominal or pelvic lymph nodes. Reproductive: Uterus and bilateral adnexa are unremarkable. Other: No abdominal wall hernia or abnormality. No abdominopelvic ascites. Musculoskeletal: No acute or significant osseous findings. IMPRESSION: 1. No CT evidence of acute abdominal/pelvic process. 2. Postsurgical changes for prior gastric bypass surgery. No evidence of bowel obstruction. Electronically Signed   By: Larose Hires D.O.   On: 02/11/2023 13:13   DG Chest 2 View  Result Date: 02/11/2023 CLINICAL DATA:  Left-sided chest pain. EXAM: CHEST - 2 VIEW COMPARISON:  Radiographs 08/22/2022 and 01/19/2021. Chest CT 10/16/2020. FINDINGS: The heart size and mediastinal contours are normal. The lungs are clear. There is no pleural effusion or pneumothorax. No acute osseous findings are identified. Telemetry  leads overlie the chest. IMPRESSION: No active cardiopulmonary process. Electronically Signed   By: Carey Bullocks M.D.   On: 02/11/2023 11:37    Physical Exam: There were no vitals taken for this visit. Constitutional: Pleasant,well-developed, ***female in no acute distress. HEENT: Normocephalic and atraumatic. Conjunctivae are normal. No scleral icterus. Neck supple.  Cardiovascular: Normal rate, regular rhythm.  Pulmonary/chest: Effort normal and breath sounds normal. No wheezing, rales or rhonchi. Abdominal: Soft, nondistended, nontender. Bowel sounds active throughout. There are no masses palpable. No hepatomegaly. Extremities: no edema Lymphadenopathy: No cervical adenopathy noted. Neurological: Alert and oriented to person place and time. Skin: Skin is warm and dry. No rashes noted. Psychiatric: Normal mood and affect. Behavior is normal.  CBC    Component Value Date/Time   WBC 9.2 02/11/2023 1108   RBC 3.54 (L) 02/11/2023 1108   HGB 12.6 02/11/2023 1108   HCT 36.8 02/11/2023 1108   PLT 287 02/11/2023 1108   MCV 104.0 (H) 02/11/2023 1108   MCH 35.6 (H) 02/11/2023 1108   MCHC 34.2 02/11/2023 1108   RDW  15.1 02/11/2023 1108   LYMPHSABS 0.5 (L) 02/11/2023 1108   MONOABS 0.5 02/11/2023 1108   EOSABS 0.0 02/11/2023 1108   BASOSABS 0.0 02/11/2023 1108    CMP     Component Value Date/Time   NA 138 02/11/2023 1140   K 3.2 (L) 02/11/2023 1140   CL 102 02/11/2023 1140   CO2 23 02/11/2023 1140   GLUCOSE 122 (H) 02/11/2023 1140   BUN 6 02/11/2023 1140   CREATININE 0.63 02/11/2023 1140   CALCIUM 8.7 (L) 02/11/2023 1140   PROT 7.5 02/11/2023 1140   ALBUMIN 4.2 02/11/2023 1140   AST 33 02/11/2023 1140   ALT 21 02/11/2023 1140   ALKPHOS 86 02/11/2023 1140   BILITOT 1.3 (H) 02/11/2023 1140   GFRNONAA >60 02/11/2023 1140   GFRAA >60 12/27/2019 0810       Latest Ref Rng & Units 02/11/2023   11:08 AM 10/04/2022   11:06 AM 10/03/2022    1:14 PM  CBC EXTENDED  WBC 4.0 -  10.5 K/uL 9.2  13.4  14.9   RBC 3.87 - 5.11 MIL/uL 3.54  3.09  3.44   Hemoglobin 12.0 - 15.0 g/dL 95.2  84.1  32.4   HCT 36.0 - 46.0 % 36.8  31.7  36.0   Platelets 150 - 400 K/uL 287  330  369   NEUT# 1.7 - 7.7 K/uL 8.1   13.7   Lymph# 0.7 - 4.0 K/uL 0.5   0.4       ASSESSMENT AND PLAN:  Delfino Lovett, FNP

## 2023-03-21 ENCOUNTER — Encounter (HOSPITAL_COMMUNITY): Payer: Self-pay

## 2023-03-21 ENCOUNTER — Other Ambulatory Visit: Payer: Self-pay

## 2023-03-21 ENCOUNTER — Emergency Department (HOSPITAL_COMMUNITY)
Admission: EM | Admit: 2023-03-21 | Discharge: 2023-03-21 | Disposition: A | Discharge status: Home / Self Care | Payer: Medicaid Other | Attending: Emergency Medicine | Admitting: Emergency Medicine

## 2023-03-21 DIAGNOSIS — F1090 Alcohol use, unspecified, uncomplicated: Secondary | ICD-10-CM

## 2023-03-21 DIAGNOSIS — G40919 Epilepsy, unspecified, intractable, without status epilepticus: Secondary | ICD-10-CM | POA: Diagnosis not present

## 2023-03-21 DIAGNOSIS — R569 Unspecified convulsions: Secondary | ICD-10-CM | POA: Diagnosis present

## 2023-03-21 DIAGNOSIS — F1010 Alcohol abuse, uncomplicated: Secondary | ICD-10-CM

## 2023-03-21 LAB — CBC WITH DIFFERENTIAL/PLATELET
Abs Immature Granulocytes: 0.06 10*3/uL (ref 0.00–0.07)
Basophils Absolute: 0.1 10*3/uL (ref 0.0–0.1)
Basophils Relative: 0 %
Eosinophils Absolute: 0 10*3/uL (ref 0.0–0.5)
Eosinophils Relative: 0 %
HCT: 35.7 % — ABNORMAL LOW (ref 36.0–46.0)
Hemoglobin: 12 g/dL (ref 12.0–15.0)
Immature Granulocytes: 0 %
Lymphocytes Relative: 6 %
Lymphs Abs: 0.9 10*3/uL (ref 0.7–4.0)
MCH: 35.9 pg — ABNORMAL HIGH (ref 26.0–34.0)
MCHC: 33.6 g/dL (ref 30.0–36.0)
MCV: 106.9 fL — ABNORMAL HIGH (ref 80.0–100.0)
Monocytes Absolute: 0.8 10*3/uL (ref 0.1–1.0)
Monocytes Relative: 6 %
Neutro Abs: 11.6 10*3/uL — ABNORMAL HIGH (ref 1.7–7.7)
Neutrophils Relative %: 88 %
Platelets: 276 10*3/uL (ref 150–400)
RBC: 3.34 MIL/uL — ABNORMAL LOW (ref 3.87–5.11)
RDW: 16.1 % — ABNORMAL HIGH (ref 11.5–15.5)
WBC: 13.4 10*3/uL — ABNORMAL HIGH (ref 4.0–10.5)
nRBC: 0 % (ref 0.0–0.2)

## 2023-03-21 LAB — COMPREHENSIVE METABOLIC PANEL
ALT: 15 U/L (ref 0–44)
AST: 25 U/L (ref 15–41)
Albumin: 3.7 g/dL (ref 3.5–5.0)
Alkaline Phosphatase: 76 U/L (ref 38–126)
Anion gap: 14 (ref 5–15)
BUN: 6 mg/dL (ref 6–20)
CO2: 21 mmol/L — ABNORMAL LOW (ref 22–32)
Calcium: 8.6 mg/dL — ABNORMAL LOW (ref 8.9–10.3)
Chloride: 100 mmol/L (ref 98–111)
Creatinine, Ser: 0.69 mg/dL (ref 0.44–1.00)
GFR, Estimated: >60 mL/min (ref >60)
Glucose, Bld: 98 mg/dL (ref 70–99)
Potassium: 3.8 mmol/L (ref 3.5–5.1)
Sodium: 135 mmol/L (ref 135–145)
Total Bilirubin: 1.8 mg/dL — ABNORMAL HIGH (ref 0.3–1.2)
Total Protein: 6.5 g/dL (ref 6.5–8.1)

## 2023-03-21 LAB — CBG MONITORING, ED: Glucose-Capillary: 95 mg/dL (ref 70–99)

## 2023-03-21 LAB — HCG, SERUM, QUALITATIVE: Preg, Serum: NEGATIVE

## 2023-03-21 MED ORDER — PROCHLORPERAZINE EDISYLATE 10 MG/2ML IJ SOLN
10.0000 mg | Freq: Once | INTRAMUSCULAR | Status: AC
  Administered 2023-03-21: 10 mg via INTRAVENOUS
  Filled 2023-03-21: qty 2

## 2023-03-21 MED ORDER — LEVETIRACETAM 500 MG PO TABS: 500.0000 mg | ORAL_TABLET | Freq: Two times a day (BID) | ORAL | 0 refills | Status: DC

## 2023-03-21 MED ORDER — LEVETIRACETAM IN NACL 1000 MG/100ML IV SOLN
1000.0000 mg | Freq: Once | INTRAVENOUS | Status: AC
  Administered 2023-03-21: 1000 mg via INTRAVENOUS
  Filled 2023-03-21: qty 100

## 2023-03-21 MED ORDER — DIPHENHYDRAMINE HCL 50 MG/ML IJ SOLN
25.0000 mg | Freq: Once | INTRAMUSCULAR | Status: AC
  Administered 2023-03-21: 25 mg via INTRAVENOUS
  Filled 2023-03-21: qty 1

## 2023-03-21 MED ORDER — MAGNESIUM SULFATE 2 GM/50ML IV SOLN
2.0000 g | Freq: Once | INTRAVENOUS | Status: AC
  Administered 2023-03-21: 2 g via INTRAVENOUS
  Filled 2023-03-21: qty 50

## 2023-03-21 NOTE — ED Notes (Signed)
PT is currently resting comfortably and watching tv.

## 2023-03-21 NOTE — ED Provider Notes (Signed)
Provider Note MRN:  [id number]  Arrival date & time: 03/21/23    ED Course and Medical Decision Making  Assumed care from Dr. Rhunette Croft at shift change.  See note from prior team for complete details, in brief:  32 yo female Hx seizure Stopped taking her keppra Back to baseline Etoh abuse as well  Keppra load    Plan per prior physician give rx for keppra  Pt re-assessed, she is feeling better Back to baseline No further seizure Labs stable Give keppra rx, was loaded here Advised etoh reduction F/u pcp/neuro  The patient improved significantly and was discharged in stable condition. Detailed discussions were had with the patient regarding current findings, and need for close f/u with PCP or on call doctor. The patient has been instructed to return immediately if the symptoms worsen in any way for re-evaluation. Patient verbalized understanding and is in agreement with current care plan. All questions answered prior to discharge.    Procedures  Final Clinical Impressions(s) / ED Diagnoses     ICD-10-CM   1. Breakthrough seizure (HCC)  G40.919     2. Alcohol abuse  F10.10       ED Discharge Orders          Ordered    levETIRAcetam (KEPPRA) 500 MG tablet  2 times daily        03/21/23 2217              Discharge Instructions      Per [facility] statutes, patients with seizures are not allowed to drive until they have been seizure-free for six months.  Other recommendations include using caution when using heavy equipment or power tools. Avoid working on ladders or at heights. Take showers instead of baths.  Do not swim alone.  Ensure the water temperature is not too high on the home water heater. Do not go swimming alone. Do not lock yourself in a room alone (i.e. bathroom). When caring for infants or small children, sit down when holding, feeding, or changing them to minimize risk of injury to the child in the event you have a seizure. Maintain good sleep  hygiene. Avoid alcohol.  Also recommend adequate sleep, hydration, good diet and minimize stress.     During the Seizure   - First, ensure adequate ventilation and place patients on the floor on their left side  Loosen clothing around the neck and ensure the airway is patent. If the patient is clenching the teeth, do not force the mouth open with any object as this can cause severe damage - Remove all items from the surrounding that can be hazardous. The patient may be oblivious to what's happening and may not even know what he or she is doing. If the patient is confused and wandering, either gently guide him/her away and block access to outside areas - Reassure the individual and be comforting - Call 911. In most cases, the seizure ends before EMS arrives. However, there are cases when seizures may last over 3 to 5 minutes. Or the individual may have developed breathing difficulties or severe injuries. If a pregnant patient or a person with diabetes develops a seizure, it is prudent to call an ambulance. - Finally, if the patient does not regain full consciousness, then call EMS. Most patients will remain confused for about 45 to 90 minutes after a seizure, so you must use judgment in calling for help. - Avoid restraints but make sure the patient is in a bed  with padded side rails - Place the individual in a lateral position with the neck slightly flexed; this will help the saliva drain from the mouth and prevent the tongue from falling backward - Remove all nearby furniture and other hazards from the area - Provide verbal assurance as the individual is regaining consciousness - Provide the patient with privacy if possible - Call for help and start treatment as ordered by the caregiver   After the Seizure (Postictal Stage)   After a seizure, most patients experience confusion, fatigue, muscle pain and/or a headache. Thus, one should permit the individual to sleep. For the next few days,  reassurance is essential. Being calm and helping reorient the person is also of importance.   Most seizures are painless and end spontaneously. Seizures are not harmful to others but can lead to complications such as stress on the lungs, brain and the heart. Individuals with prior lung problems may develop labored breathing and respiratory distress.      Strongly consider reduction in your alcohol use           Sloan Leiter, DO 03/21/23 2221

## 2023-03-21 NOTE — ED Notes (Signed)
PT is c/o of a severe headache. A dm was sent to [facility] MD

## 2023-03-21 NOTE — Discharge Instructions (Addendum)
Per [facility] statutes, patients with seizures are not allowed to drive until they have been seizure-free for six months.  Other recommendations include using caution when using heavy equipment or power tools. Avoid working on ladders or at heights. Take showers instead of baths.  Do not swim alone.  Ensure the water temperature is not too high on the home water heater. Do not go swimming alone. Do not lock yourself in a room alone (i.e. bathroom). When caring for infants or small children, sit down when holding, feeding, or changing them to minimize risk of injury to the child in the event you have a seizure. Maintain good sleep hygiene. Avoid alcohol.  Also recommend adequate sleep, hydration, good diet and minimize stress.     During the Seizure   - First, ensure adequate ventilation and place patients on the floor on their left side  Loosen clothing around the neck and ensure the airway is patent. If the patient is clenching the teeth, do not force the mouth open with any object as this can cause severe damage - Remove all items from the surrounding that can be hazardous. The patient may be oblivious to what's happening and may not even know what he or she is doing. If the patient is confused and wandering, either gently guide him/her away and block access to outside areas - Reassure the individual and be comforting - Call 911. In most cases, the seizure ends before EMS arrives. However, there are cases when seizures may last over 3 to 5 minutes. Or the individual may have developed breathing difficulties or severe injuries. If a pregnant patient or a person with diabetes develops a seizure, it is prudent to call an ambulance. - Finally, if the patient does not regain full consciousness, then call EMS. Most patients will remain confused for about 45 to 90 minutes after a seizure, so you must use judgment in calling for help. - Avoid restraints but make sure the patient is in a bed with  padded side rails - Place the individual in a lateral position with the neck slightly flexed; this will help the saliva drain from the mouth and prevent the tongue from falling backward - Remove all nearby furniture and other hazards from the area - Provide verbal assurance as the individual is regaining consciousness - Provide the patient with privacy if possible - Call for help and start treatment as ordered by the caregiver   After the Seizure (Postictal Stage)   After a seizure, most patients experience confusion, fatigue, muscle pain and/or a headache. Thus, one should permit the individual to sleep. For the next few days, reassurance is essential. Being calm and helping reorient the person is also of importance.   Most seizures are painless and end spontaneously. Seizures are not harmful to others but can lead to complications such as stress on the lungs, brain and the heart. Individuals with prior lung problems may develop labored breathing and respiratory distress.      Strongly consider reduction in your alcohol use

## 2023-03-21 NOTE — ED Provider Notes (Signed)
[place] EMERGENCY DEPARTMENT AT [facility] Provider Note   CSN: [id number] Arrival date & time: 03/21/23  1720     History {Add pertinent medical, surgical, social history, OB history to HPI:1} Chief Complaint  Patient presents with   Seizures    Marie Myers is a 32 y.o. female.  HPI    32 year old female comes in with chief complaint of seizure Home Medications Prior to Admission medications   Medication Sig Start Date End Date Taking? Authorizing Provider  folic acid (FOLVITE) 1 MG tablet Take 1 tablet (1 mg total) by mouth daily. 10/05/22   Azucena Fallen, MD  gabapentin (NEURONTIN) 300 MG capsule Take 2 capsules (600 mg total) by mouth 4 (four) times daily. 10/04/22   Azucena Fallen, MD  guanFACINE (TENEX) 1 MG tablet Take 1 tablet (1 mg total) by mouth at bedtime. 05/28/22   Shanna Cisco, NP  levETIRAcetam (KEPPRA) 500 MG tablet Take 1 tablet (500 mg total) by mouth 2 (two) times daily. 10/04/22   Azucena Fallen, MD  Multiple Vitamin (MULTIVITAMIN WITH MINERALS) TABS tablet Take 1 tablet by mouth daily. 10/05/22   Azucena Fallen, MD  ondansetron (ZOFRAN ODT) 4 MG disintegrating tablet Take 1 tablet (4 mg total) by mouth every 8 (eight) hours as needed for nausea or vomiting. 01/19/21   Caccavale, Sophia, PA-C  ondansetron (ZOFRAN-ODT) 4 MG disintegrating tablet Take 1 tablet (4 mg total) by mouth every 8 (eight) hours as needed for up to 15 doses for nausea or vomiting. 02/11/23   Terald Sleeper, MD  potassium chloride (KLOR-CON) 20 MEQ packet Take 40 mEq by mouth daily for 15 days. 02/11/23 02/26/23  Terald Sleeper, MD  promethazine (PHENERGAN) 25 MG suppository Place 1 suppository (25 mg total) rectally every 8 (eight) hours as needed for up to 6 days for nausea or vomiting. 02/11/23 02/17/23  Terald Sleeper, MD  risperiDONE (RISPERDAL) 1 MG tablet Take 1 tablet (1 mg total) by mouth at bedtime for 7 days, THEN 1 tablet (1 mg total)  2 (two) times daily. 01/29/23 04/06/23  Karsten Ro, MD  sertraline (ZOLOFT) 100 MG tablet Take 1 tablet (100 mg total) by mouth daily for 7 days, THEN 2 tablets (200 mg total) daily. 01/29/23 04/06/23  Karsten Ro, MD  sucralfate (CARAFATE) 1 GM/10ML suspension Take 10 mLs (1 g total) by mouth 4 (four) times daily -  with meals and at bedtime. 01/19/21   Caccavale, Sophia, PA-C  thiamine (VITAMIN B-1) 100 MG tablet Take 1 tablet (100 mg total) by mouth daily. 10/05/22   Azucena Fallen, MD  thiamine 100 MG tablet Take 1 tablet (100 mg total) by mouth daily. 01/14/21   Laveda Abbe, NP  traZODone (DESYREL) 100 MG tablet Take 1 tablet (100 mg total) by mouth at bedtime. 02/12/23   Carlyn Reichert, MD  vitamin B-12 1000 MCG tablet Take 1 tablet (1,000 mcg total) by mouth daily. 01/14/21   Laveda Abbe, NP  Vitamin D, Ergocalciferol, (DRISDOL) 1.25 MG (50000 UNIT) CAPS capsule Take 50,000 Units by mouth every 7 (seven) days. Every tuesday    [provider]  VIVITROL 380 MG SUSR Inject 380 mg into the muscle every 28 (twenty-eight) days. 08/21/22   [provider]      Allergies    Zithromax [azithromycin]    Review of Systems   Review of Systems  Physical Exam Updated Vital Signs BP (!) 129/95   Pulse [name])  105   Resp (!) 22   Ht 5\' 6"  (1.676 m)   Wt 93 kg   SpO2 95%   BMI 33.09 kg/m  Physical Exam  ED Results / Procedures / Treatments   Labs (all labs ordered are listed, but only abnormal results are displayed) Labs Reviewed  CBC WITH DIFFERENTIAL/PLATELET - Abnormal; Notable for the following components:      Result Value   WBC 13.4 (*)    RBC 3.34 (*)    HCT 35.7 (*)    MCV 106.9 (*)    MCH 35.9 (*)    RDW 16.1 (*)    Neutro Abs 11.6 (*)    All other components within normal limits  COMPREHENSIVE METABOLIC PANEL  HCG, SERUM, QUALITATIVE  CBG MONITORING, ED    EKG None  Radiology No results found.  Procedures Procedures   {Document cardiac monitor, telemetry assessment procedure when appropriate:1}  Medications Ordered in ED Medications  magnesium sulfate IVPB 2 g 50 mL (2 g Intravenous New Bag/Given 03/21/23 2037)  prochlorperazine (COMPAZINE) injection 10 mg (10 mg Intravenous Given 03/21/23 2030)  diphenhydrAMINE (BENADRYL) injection 25 mg (25 mg Intravenous Given 03/21/23 2029)  levETIRAcetam (KEPPRA) IVPB 1000 mg/100 mL premix (0 mg Intravenous Stopped 03/21/23 2114)    ED Course/ Medical Decision Making/ A&P   {   Click here for ABCD2, HEART and other calculatorsREFRESH Note before signing :1}                          Medical Decision Making Amount and/or Complexity of Data Reviewed Labs: ordered.  Risk Prescription drug management.   ***  {Document critical care time when appropriate:1} {Document review of labs and clinical decision tools ie heart score, Chads2Vasc2 etc:1}  {Document your independent review of radiology images, and any outside records:1} {Document your discussion with family members, caretakers, and with consultants:1} {Document social determinants of health affecting pt's care:1} {Document your decision making why or why not admission, treatments were needed:1} Final Clinical Impression(s) / ED Diagnoses Final diagnoses:  None    Rx / DC Orders ED Discharge Orders     None

## 2023-03-21 NOTE — ED Triage Notes (Signed)
PT was BIB by [facility] from home after mom and son witnessed  her having a active seizure.Seizure was at 1530.CBG was 115.PT initially refused EMS transport but while EMS was there, PT had another seizure.EMS reported it lasing 45 secs-84min and PT was foaming at mouth. PT was given 5mg  of midozalam. PT is a alcohol drinker.

## 2023-03-26 ENCOUNTER — Encounter (HOSPITAL_COMMUNITY): Payer: Self-pay

## 2023-03-26 ENCOUNTER — Encounter (HOSPITAL_COMMUNITY): Payer: Medicaid Other | Admitting: Student

## 2023-06-01 ENCOUNTER — Ambulatory Visit (INDEPENDENT_AMBULATORY_CARE_PROVIDER_SITE_OTHER): Payer: Medicaid Other | Admitting: Psychiatry

## 2023-06-01 ENCOUNTER — Encounter (HOSPITAL_COMMUNITY): Payer: Self-pay | Admitting: Psychiatry

## 2023-06-01 ENCOUNTER — Encounter (HOSPITAL_COMMUNITY): Payer: Medicaid Other | Admitting: Student

## 2023-06-01 VITALS — BP 120/92 | HR 98 | Temp 98.8°F | Ht 66.0 in | Wt 178.6 lb

## 2023-06-01 DIAGNOSIS — F1210 Cannabis abuse, uncomplicated: Secondary | ICD-10-CM

## 2023-06-01 DIAGNOSIS — F1094 Alcohol use, unspecified with alcohol-induced mood disorder: Secondary | ICD-10-CM

## 2023-06-01 DIAGNOSIS — F411 Generalized anxiety disorder: Secondary | ICD-10-CM

## 2023-06-01 MED ORDER — TRAZODONE HCL 100 MG PO TABS: 200.0000 mg | ORAL_TABLET | Freq: Every day | ORAL | 3 refills | Status: DC

## 2023-06-01 MED ORDER — ARIPIPRAZOLE 5 MG PO TABS
5.0000 mg | ORAL_TABLET | Freq: Every day | ORAL | 3 refills | Status: DC
Start: 2023-06-01 — End: 2023-10-08

## 2023-06-01 NOTE — Progress Notes (Signed)
[facility] MD/PA/NP OP Progress Note  06/01/2023 12:12 PM Marie Myers  MRN:  [id number]  Chief Complaint: "I have been down"  HPI: 32 year old female seen today for follow-up psychiatric evaluation.  She has a psychiatric history of depression, alcohol use disorder, anxiety, insomnia, and marijuana use.  Currently she is managed on Risperdal 2 mg nightly, Zoloft 200 mg daily, trazodone 100 mg nightly as needed (patient notes that she has been taking 200 mg).  Patient also informed Clinical research associate that she takes Zoloft periodically as she finds it ineffective.  Patient informed Clinical research associate that she has been without her Risperdal for 2 weeks.  She informed Clinical research associate that she found Zoloft ineffective as well. Patient is also prescribed gabapentin but notes that she has not taken it since being hospitalized 03/21/2023 for a seizure. She also takes Keppra.   Today she is well-groomed, pleasant, cooperative, and engaged in conversation.  Patient tearful throughout the exam.  She notes that she feels down and has not felt happy in a long time.  Patient informed Clinical research associate that last week she lost her job because she was trying to focus on school.  She notes that she is studying to become a Engineer, site and attends school at [employer].  Patient informed Clinical research associate that she went from making all A's to all B's.  She notes that she is unable to concentrate.  She also informed Clinical research associate that she is forgetful and disorganized.  She notes that she is not the best version her herself and holds guilt because of this.  She informed Clinical research associate that she is not the wife that she promised her husband she would be or the mother she wants to be for her son.  To cope with above patient notes that he drinks 3 alcoholic beverages a night.  Provider informed patient that alcohol can exacerbate her mental health.  She endorsed understanding however notes that she does not feel alcohol is causing her issues.  She informed Clinical research associate that it makes her happier.  Provider conducted an Audit assessment and patient scored a 26.  Patient also has been having increased seizure activity.  She notes that she has been told to have alcohol is in the this issues.  She however notes that she feels that the stress. Provider recommend [facility] to help her detox from alcohol however she was not agreeable. She was agreeable to therapy.   Patient notes that the above exacerbates her anxiety and depression.  Today provider conducted a GAD-7 and patient scored a 20.  Provider also conducted a PHQ-9 the patient scored a 25.  She notes that her sleep fluctuates and reports that she sleeps all day or not at all.  At times she is irritable, distractible, and having racing thoughts.  She notes that she has auditory and visual hallucinations.  She informed Clinical research associate it is like being in a dreamlike state. Patient notes that he appetite has been poor. She has lost 12 pounds since her last visit.   Patient notes that she smokes marijuana daily. Provider informed patient that marijuana and alcohol can exacerbate her mental health.  She endorsed understanding however disagreed.  Patient informed Clinical research associate that she takes Zoloft infrequently and request to discontinue it today.  She also has been out of Risperdal for 2 weeks and found it effective.  Today she is agreeable to starting Abilify 5 mg to help manage mood.  Trazodone increased from 100 mg to 200 mg nightly to help manage sleep  Patient informed that she  could be tried on naltrexone 50 mg pending a UDS.  Provider also ordered vitamin D, vitamin B12, thyroid panel, lipid panel, CMP, prolactin level, and CBC.  She will continue all other medications as prescribed.  Provider referred patient to [facility] for substance use therapy. 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 Cannabis abuse  F12.10 Ambulatory referral to Social Work    3. Generalized anxiety disorder  F41.1 Ambulatory referral to Social Work      Past Psychiatric History:  depression, alcohol use disorder, anxiety, insomnia, and marijuana use  Past Medical History:  Past Medical History:  Diagnosis Date   Anxiety    Depression    H/O gastric bypass    Insomnia    Marijuana use 01/08/2021   Moderate alcohol use disorder (HCC) 01/08/2021    Past Surgical History:  Procedure Laterality Date   CHOLECYSTECTOMY     GASTRIC BYPASS     GASTRIC BYPASS     LAPAROSCOPY N/A 08/11/2019   Procedure: LAPAROSCOPY DIAGNOSTIC;  Surgeon: Harriette Bouillon, MD;  Location: [facility] OR;  Service: General;  Laterality: N/A;    Family Psychiatric History: Son SI and ADHD  Family History:  Family History  Problem Relation Age of Onset   Diabetes Mother    Diabetes Father     Social History:  Social History   Socioeconomic History   Marital status: Single    Spouse name: Not on file   Number of children: Not on file   Years of education: Not on file   Highest education level: Not on file  Occupational History   Not on file  Tobacco Use   Smoking status: Never   Smokeless tobacco: Never  Vaping Use   Vaping status: Every Day   Substances: Nicotine, Flavoring  Substance and Sexual Activity   Alcohol use: Yes    Alcohol/week: 1.0 standard drink of alcohol    Types: 1 Glasses of wine per week   Drug use: Yes    Types: Marijuana   Sexual activity: Not on file  Other Topics Concern   Not on file  Social History Narrative   Not on file   Social Determinants of Health   Financial Resource Strain: Not on file  Food Insecurity: Not on file  Transportation Needs: Not on file  Physical Activity: Not on  file  Stress: Not on file  Social Connections: Unknown (11/19/2022)   [facility] from [facility]   Social Network    Social Network: Not on file    Allergies:  Allergies  Allergen Reactions   Zithromax [Azithromycin] Hives    Metabolic Disorder Labs: Lab Results  Component Value Date   HGBA1C 4.5 (L) 01/12/2022   MPG 82.45 01/12/2022   MPG 88.19 01/08/2021   No results found for: "PROLACTIN" Lab Results  Component Value Date   CHOL 177 01/12/2022   TRIG 42 01/12/2022   HDL 119 01/12/2022   CHOLHDL 1.5 01/12/2022   VLDL 8 01/12/2022  LDLCALC 50 01/12/2022   LDLCALC 38 01/08/2021   Lab Results  Component Value Date   TSH 2.338 01/12/2022   TSH 1.087 01/08/2021    Therapeutic Level Labs: No results found for: "LITHIUM" No results found for: "VALPROATE" No results found for: "CBMZ"  Current Medications: Current Outpatient Medications  Medication Sig Dispense Refill   ARIPiprazole (ABILIFY) 5 MG tablet Take 1 tablet (5 mg total) by mouth daily. 30 tablet 3   folic acid (FOLVITE) 1 MG tablet Take 1 tablet (1 mg total) by mouth daily. 30 tablet 0   gabapentin (NEURONTIN) 300 MG capsule Take 2 capsules (600 mg total) by mouth 4 (four) times daily. 240 capsule 1   guanFACINE (TENEX) 1 MG tablet Take 1 tablet (1 mg total) by mouth at bedtime. 30 tablet 3   levETIRAcetam (KEPPRA) 500 MG tablet Take 1 tablet (500 mg total) by mouth 2 (two) times daily. 60 tablet 1   levETIRAcetam (KEPPRA) 500 MG tablet Take 1 tablet (500 mg total) by mouth 2 (two) times daily. 60 tablet 0   Multiple Vitamin (MULTIVITAMIN WITH MINERALS) TABS tablet Take 1 tablet by mouth daily. 30 tablet 1   ondansetron (ZOFRAN ODT) 4 MG disintegrating tablet Take 1 tablet (4 mg total) by mouth every 8 (eight) hours as needed for nausea or vomiting. 10 tablet 0   ondansetron (ZOFRAN-ODT) 4 MG disintegrating tablet Take 1 tablet (4 mg total) by mouth every 8 (eight) hours as needed for up to 15  doses for nausea or vomiting. 15 tablet 0   potassium chloride (KLOR-CON) 20 MEQ packet Take 40 mEq by mouth daily for 15 days. 30 packet 0   promethazine (PHENERGAN) 25 MG suppository Place 1 suppository (25 mg total) rectally every 8 (eight) hours as needed for up to 6 days for nausea or vomiting. 6 each 0   sucralfate (CARAFATE) 1 GM/10ML suspension Take 10 mLs (1 g total) by mouth 4 (four) times daily -  with meals and at bedtime. 420 mL 0   thiamine (VITAMIN B-1) 100 MG tablet Take 1 tablet (100 mg total) by mouth daily. 30 tablet 1   thiamine 100 MG tablet Take 1 tablet (100 mg total) by mouth daily.     traZODone (DESYREL) 100 MG tablet Take 2 tablets (200 mg total) by mouth at bedtime. 60 tablet 3   vitamin B-12 1000 MCG tablet Take 1 tablet (1,000 mcg total) by mouth daily. 30 tablet 0   Vitamin D, Ergocalciferol, (DRISDOL) 1.25 MG (50000 UNIT) CAPS capsule Take 50,000 Units by mouth every 7 (seven) days. Every tuesday     VIVITROL 380 MG SUSR Inject 380 mg into the muscle every 28 (twenty-eight) days.     No current facility-administered medications for this visit.     Musculoskeletal: Strength & Muscle Tone: within normal limits Gait & Station: normal Patient leans: N/A  Psychiatric Specialty Exam: Review of Systems  There were no vitals taken for this visit.There is no height or weight on file to calculate BMI.  General Appearance: Well Groomed  Eye Contact:  Good  Speech:  Clear and Coherent and Normal Rate  Volume:  Normal  Mood:  Anxious and Depressed  Affect:  Appropriate  Thought Process:  Coherent, Irrelevant, and NA  Orientation:  Full (Time, Place, and Person)  Thought Content: Logical and Hallucinations: Auditory Visual   Suicidal Thoughts:  No  Homicidal Thoughts:  No  Memory:  Immediate;   Good Recent;   Fair Remote;  Fair  Judgement:  Fair  Insight:  Fair  Psychomotor Activity:  Normal  Concentration:  Concentration: Good and Attention Span: Good   Recall:  Good  Fund of Knowledge: Good  Language: Good  Akathisia:  No  Handed:  Right  AIMS (if indicated): not done  Assets:  Communication Skills Desire for Improvement Housing Leisure Time Physical Health Social Support Talents/Skills Vocational/Educational  ADL's:  Intact  Cognition: WNL  Sleep:  Poor   Screenings: AIMS    Flowsheet Row [facility] Ad[facility] ([facility]  AIMS Total Score 0 0      AUDIT    Flowsheet Row Video Visit from 05/28/2022 in [facility] Office Visit from 02/20/2022 in [facility] Admission (Discharged) [facility] Ad[facility] (Discharged[facility]  Alcohol Use Disorder Identification Test Final Score (AUDIT) 18 17 8  32      GAD-7    Flowsheet Row Video Visit from 05/28/2022 in [facility] Office Visit from 02/20/2022 in [facility]  Total GAD-7 Score 11 16      PHQ2-9    Flowsheet Row Video Visit from 05/28/2022 in [facility] Office Visit from 02/20/2022 in [facility]  PHQ-2 Total Score 5 5  PHQ-9 Total Score 18 15      Flowsheet Row ED from 03/21/2023 in [facility] Emergency Department at [facility] ED from 02/11/2023 in [facility] Emergency Department at [facility] ED to Hosp-Admission (Discharged) from 10/03/2022 in [facility]  C-SSRS RISK CATEGORY No Risk No Risk No Risk        Assessment and Plan: Patient endorses alcohol use, marijuana use, anxiety, depression, and poor sleep.  Patient informed Clinical research associate that she takes Zoloft infrequently and request to discontinue it today.  She  also has been out of Risperdal for 2 weeks and found it effective.  Today she is agreeable to starting Abilify 5 mg to help manage mood.  Trazodone increased from 100 mg to 200 mg nightly to help manage sleep  Patient informed that she could be tried on naltrexone 50 mg pending a UDS.  Provider also ordered vitamin D, vitamin B12, thyroid panel, lipid panel, CMP, prolactin level, and CBC.  She will continue all other medications as prescribed.  Provider referred patient to [facility] for substance use therapy.  1. Alcohol-induced mood disorder (HCC)  - Ambulatory referral to Social Work - CBC w/Diff/Platelet - Comprehensive Metabolic Panel (CMET) - Hepatic function panel - Thyroid Panel With TSH - Urine Drug Panel 7 - Lipid Profile Start- ARIPiprazole (ABILIFY) 5 MG tablet; Take 1 tablet (5 mg total) by mouth daily.  Dispense: 30 tablet; Refill: 3 - Prolactin Increased- traZODone (DESYREL) 100 MG tablet; Take 2 tablets (200 mg total) by mouth at bedtime.  Dispense: 60 tablet; Refill: 3 - Vitamin D (25 hydroxy) - Vitamin B12  2. Cannabis abuse  - Ambulatory referral to Social Work  3. Generalized anxiety disorder  - Ambulatory referral to Social Work   Collaboration of Care: Collaboration of Care: Other provider involved in patient's care AEB Counselor and PCP  Patient/Guardian was advised Release of Information must be obtained prior to any record release in order to collaborate their care with an  outside provider. Patient/Guardian was advised if they have not already done so to contact the registration department to sign all necessary forms in order for [place] to release information regarding their care.   Consent: Patient/Guardian gives verbal consent for treatment and assignment of benefits for services provided during this visit. Patient/Guardian expressed understanding and agreed to proceed.    Shanna Cisco, NP 06/01/2023, 12:12 PM

## 2023-06-01 NOTE — Progress Notes (Deleted)
BH MD Outpatient Progress Note  06/01/2023 7:38 AM Marie Myers  MRN:  [id number]  Assessment:  Marie Myers presents for follow-up evaluation. Today, patient reports ***  Identifying Information: Marie Myers is a 32 y.o. y.o. female with a history of *** who is an established patient with [facility] for management of ***.   Plan:  # *** Interventions: -- ***  Patient was given contact information for behavioral health clinic and was instructed to call 911 for emergencies.   Subjective:  Chief Complaint: No chief complaint on file.   Interval History:   CR VD last seen 6/7 (2 no show's) Off meds x2 weeks Dx GAD, alc ind mood d/o (also lots of MJ) Restart Zol 200, start risp 1 bid (dissociation episodes) Hx Sz admit Feb 2024 - Dc'd w gaba and keppra  Admit [facility] 2023 for SI w plan Dx MDD (heavy EtOH use noted) Last saw neuro Jan 2024 Dx= seizures ; no f/u   SocHx      Visit Diagnosis: No diagnosis found.  Past Psychiatric History: ***  Past Medical History:  Past Medical History:  Diagnosis Date   Anxiety    Depression    H/O gastric bypass    Insomnia    Marijuana use 01/08/2021   Moderate alcohol use disorder (HCC) 01/08/2021    Past Surgical History:  Procedure Laterality Date   CHOLECYSTECTOMY     GASTRIC BYPASS     GASTRIC BYPASS     LAPAROSCOPY N/A 08/11/2019   Procedure: LAPAROSCOPY DIAGNOSTIC;  Surgeon: Harriette Bouillon, MD;  Location: [facility] OR;  Service: General;  Laterality: N/A;    Family Psychiatric History: ***  Family History:  Family History  Problem Relation Age of Onset   Diabetes Mother    Diabetes Father     Social History:  Social History   Socioeconomic History   Marital status: Single    Spouse name: Not on file   Number of children: Not on file   Years of education: Not on file   Highest education level: Not on file  Occupational History   Not on file  Tobacco Use   Smoking status: Never    Smokeless tobacco: Never  Vaping Use   Vaping status: Every Day   Substances: Nicotine, Flavoring  Substance and Sexual Activity   Alcohol use: Yes    Alcohol/week: 1.0 standard drink of alcohol    Types: 1 Glasses of wine per week   Drug use: Yes    Types: Marijuana   Sexual activity: Not on file  Other Topics Concern   Not on file  Social History Narrative   Not on file   Social Determinants of Health   Financial Resource Strain: Not on file  Food Insecurity: Not on file  Transportation Needs: Not on file  Physical Activity: Not on file  Stress: Not on file  Social Connections: Unknown (11/19/2022)   [facility]   Social Network    Social Network: Not on file    Allergies:  Allergies  Allergen Reactions   Zithromax [Azithromycin] Hives    Current Medications: Current Outpatient Medications  Medication Sig Dispense Refill   folic acid (FOLVITE) 1 MG tablet Take 1 tablet (1 mg total) by mouth daily. 30 tablet 0   gabapentin (NEURONTIN) 300 MG capsule Take 2 capsules (600 mg total) by mouth 4 (four) times daily. 240 capsule 1   guanFACINE (TENEX) 1 MG tablet Take 1 tablet (1  mg total) by mouth at bedtime. 30 tablet 3   levETIRAcetam (KEPPRA) 500 MG tablet Take 1 tablet (500 mg total) by mouth 2 (two) times daily. 60 tablet 1   levETIRAcetam (KEPPRA) 500 MG tablet Take 1 tablet (500 mg total) by mouth 2 (two) times daily. 60 tablet 0   Multiple Vitamin (MULTIVITAMIN WITH MINERALS) TABS tablet Take 1 tablet by mouth daily. 30 tablet 1   ondansetron (ZOFRAN ODT) 4 MG disintegrating tablet Take 1 tablet (4 mg total) by mouth every 8 (eight) hours as needed for nausea or vomiting. 10 tablet 0   ondansetron (ZOFRAN-ODT) 4 MG disintegrating tablet Take 1 tablet (4 mg total) by mouth every 8 (eight) hours as needed for up to 15 doses for nausea or vomiting. 15 tablet 0   potassium chloride (KLOR-CON) 20 MEQ packet Take 40 mEq by mouth daily for 15  days. 30 packet 0   promethazine (PHENERGAN) 25 MG suppository Place 1 suppository (25 mg total) rectally every 8 (eight) hours as needed for up to 6 days for nausea or vomiting. 6 each 0   risperiDONE (RISPERDAL) 1 MG tablet Take 1 tablet (1 mg total) by mouth at bedtime for 7 days, THEN 1 tablet (1 mg total) 2 (two) times daily. 127 tablet 0   sertraline (ZOLOFT) 100 MG tablet Take 1 tablet (100 mg total) by mouth daily for 7 days, THEN 2 tablets (200 mg total) daily. 127 tablet 0   sucralfate (CARAFATE) 1 GM/10ML suspension Take 10 mLs (1 g total) by mouth 4 (four) times daily -  with meals and at bedtime. 420 mL 0   thiamine (VITAMIN B-1) 100 MG tablet Take 1 tablet (100 mg total) by mouth daily. 30 tablet 1   thiamine 100 MG tablet Take 1 tablet (100 mg total) by mouth daily.     traZODone (DESYREL) 100 MG tablet Take 1 tablet (100 mg total) by mouth at bedtime. 30 tablet 1   vitamin B-12 1000 MCG tablet Take 1 tablet (1,000 mcg total) by mouth daily. 30 tablet 0   Vitamin D, Ergocalciferol, (DRISDOL) 1.25 MG (50000 UNIT) CAPS capsule Take 50,000 Units by mouth every 7 (seven) days. Every tuesday     VIVITROL 380 MG SUSR Inject 380 mg into the muscle every 28 (twenty-eight) days.     No current facility-administered medications for this visit.     Objective:  Psychiatric Specialty Exam: Physical Exam Constitutional:      Appearance: the patient is not toxic-appearing.  Pulmonary:     Effort: Pulmonary effort is normal.  Neurological:     General: No focal deficit present.     Mental Status: the patient is alert and oriented to person, place, and time.   Review of Systems  Respiratory:  Negative for shortness of breath.   Cardiovascular:  Negative for chest pain.  Gastrointestinal:  Negative for abdominal pain, constipation, diarrhea, nausea and vomiting.  Neurological:  Negative for headaches.      There were no vitals taken for this visit.  General Appearance: Fairly Groomed   Eye Contact:  Good  Speech:  Clear and Coherent  Volume:  Normal  Mood:  Euthymic  Affect:  Congruent  Thought Process:  Coherent  Orientation:  Full (Time, Place, and Person)  Thought Content: Logical   Suicidal Thoughts:  No  Homicidal Thoughts:  No  Memory:  Immediate;   Good  Judgement:  fair  Insight:  fair  Psychomotor Activity:  Normal  Concentration:  Concentration: Good  Recall:  Good  Fund of Knowledge: Good  Language: Good  Akathisia:  No  Handed:    AIMS (if indicated): not done  Assets:  Communication Skills Desire for Improvement Financial Resources/Insurance Housing Leisure Time Physical Health  ADL's:  Intact  Cognition: WNL  Sleep:  Fair     Metabolic Disorder Labs: Lab Results  Component Value Date   HGBA1C 4.5 (L) 01/12/2022   MPG 82.45 01/12/2022   MPG 88.19 01/08/2021   No results found for: "PROLACTIN" Lab Results  Component Value Date   CHOL 177 01/12/2022   TRIG 42 01/12/2022   HDL 119 01/12/2022   CHOLHDL 1.5 01/12/2022   VLDL 8 01/12/2022   LDLCALC 50 01/12/2022   LDLCALC 38 01/08/2021   Lab Results  Component Value Date   TSH 2.338 01/12/2022   TSH 1.087 01/08/2021    Therapeutic Level Labs: No results found for: "LITHIUM" No results found for: "VALPROATE" No results found for: "CBMZ"  Screenings: AIMS    Flowsheet Row [facility]) [facility]  AIMS Total Score 0 0      AUDIT    Flowsheet Row Video Visit from 05/28/2022 in [facility] Office Visit from 02/20/2022 in [facility] ([facility]  Alcohol Use Disorder Identification Test Final Score (AUDIT) 18 17  8  32      GAD-7    Flowsheet Row Video Visit from 05/28/2022 in [facility] Office Visit from 02/20/2022 in [facility]  Total GAD-7 Score 11 16      PHQ2-9    Flowsheet Row Video Visit from 05/28/2022 in [facility] Office Visit from 02/20/2022 in [facility]  PHQ-2 Total Score 5 5  PHQ-9 Total Score 18 15      Flowsheet Row ED from 03/21/2023 in [facility] Emergency Department at [facility] ED from 02/11/2023 in [facility] Emergency Department at [facility] ED to Hosp-Admission (Discharged) from 10/03/2022 in [facility]  C-SSRS RISK CATEGORY No Risk No Risk No Risk       Collaboration of Care: none  A total of 30 minutes was spent involved in face to face clinical care, chart review, documentation.   Carlyn Reichert, MD 06/01/2023, 7:38 AM

## 2023-06-03 ENCOUNTER — Encounter (HOSPITAL_COMMUNITY): Payer: Self-pay

## 2023-06-03 ENCOUNTER — Other Ambulatory Visit: Payer: Self-pay

## 2023-06-03 ENCOUNTER — Inpatient Hospital Stay (HOSPITAL_COMMUNITY)
Admission: EM | Admit: 2023-06-03 | Discharge: 2023-06-08 | DRG: 177 | Disposition: A | Discharge status: Home / Self Care | Payer: Medicaid Other | Attending: Family Medicine | Admitting: Family Medicine

## 2023-06-03 ENCOUNTER — Emergency Department (HOSPITAL_COMMUNITY): Payer: Medicaid Other

## 2023-06-03 DIAGNOSIS — Z789 Other specified health status: Secondary | ICD-10-CM | POA: Diagnosis present

## 2023-06-03 DIAGNOSIS — D519 Vitamin B12 deficiency anemia, unspecified: Secondary | ICD-10-CM | POA: Diagnosis present

## 2023-06-03 DIAGNOSIS — F32A Depression, unspecified: Secondary | ICD-10-CM | POA: Diagnosis present

## 2023-06-03 DIAGNOSIS — F17290 Nicotine dependence, other tobacco product, uncomplicated: Secondary | ICD-10-CM | POA: Diagnosis present

## 2023-06-03 DIAGNOSIS — R160 Hepatomegaly, not elsewhere classified: Secondary | ICD-10-CM | POA: Diagnosis present

## 2023-06-03 DIAGNOSIS — E871 Hypo-osmolality and hyponatremia: Secondary | ICD-10-CM | POA: Diagnosis not present

## 2023-06-03 DIAGNOSIS — G40909 Epilepsy, unspecified, not intractable, without status epilepticus: Secondary | ICD-10-CM

## 2023-06-03 DIAGNOSIS — Z9884 Bariatric surgery status: Secondary | ICD-10-CM

## 2023-06-03 DIAGNOSIS — F419 Anxiety disorder, unspecified: Secondary | ICD-10-CM | POA: Diagnosis present

## 2023-06-03 DIAGNOSIS — K449 Diaphragmatic hernia without obstruction or gangrene: Secondary | ICD-10-CM | POA: Diagnosis present

## 2023-06-03 DIAGNOSIS — E876 Hypokalemia: Secondary | ICD-10-CM | POA: Diagnosis not present

## 2023-06-03 DIAGNOSIS — E861 Hypovolemia: Secondary | ICD-10-CM | POA: Diagnosis not present

## 2023-06-03 DIAGNOSIS — K760 Fatty (change of) liver, not elsewhere classified: Secondary | ICD-10-CM | POA: Diagnosis present

## 2023-06-03 DIAGNOSIS — F1290 Cannabis use, unspecified, uncomplicated: Secondary | ICD-10-CM | POA: Diagnosis present

## 2023-06-03 DIAGNOSIS — F1010 Alcohol abuse, uncomplicated: Secondary | ICD-10-CM | POA: Diagnosis present

## 2023-06-03 DIAGNOSIS — E538 Deficiency of other specified B group vitamins: Secondary | ICD-10-CM | POA: Insufficient documentation

## 2023-06-03 DIAGNOSIS — J851 Abscess of lung with pneumonia: Secondary | ICD-10-CM | POA: Diagnosis present

## 2023-06-03 DIAGNOSIS — J189 Pneumonia, unspecified organism: Secondary | ICD-10-CM | POA: Diagnosis present

## 2023-06-03 DIAGNOSIS — D696 Thrombocytopenia, unspecified: Secondary | ICD-10-CM | POA: Insufficient documentation

## 2023-06-03 DIAGNOSIS — Z881 Allergy status to other antibiotic agents status: Secondary | ICD-10-CM

## 2023-06-03 DIAGNOSIS — D539 Nutritional anemia, unspecified: Secondary | ICD-10-CM | POA: Insufficient documentation

## 2023-06-03 DIAGNOSIS — Z79899 Other long term (current) drug therapy: Secondary | ICD-10-CM

## 2023-06-03 DIAGNOSIS — F1090 Alcohol use, unspecified, uncomplicated: Secondary | ICD-10-CM | POA: Diagnosis present

## 2023-06-03 DIAGNOSIS — Z1152 Encounter for screening for COVID-19: Secondary | ICD-10-CM

## 2023-06-03 DIAGNOSIS — J850 Gangrene and necrosis of lung: Principal | ICD-10-CM | POA: Diagnosis present

## 2023-06-03 DIAGNOSIS — R112 Nausea with vomiting, unspecified: Secondary | ICD-10-CM | POA: Diagnosis present

## 2023-06-03 DIAGNOSIS — F5105 Insomnia due to other mental disorder: Secondary | ICD-10-CM | POA: Diagnosis present

## 2023-06-03 DIAGNOSIS — Z833 Family history of diabetes mellitus: Secondary | ICD-10-CM

## 2023-06-03 LAB — BASIC METABOLIC PANEL
Anion gap: 18 — ABNORMAL HIGH (ref 5–15)
BUN: <5 mg/dL — ABNORMAL LOW (ref 6–20)
CO2: 22 mmol/L (ref 22–32)
Calcium: 8.6 mg/dL — ABNORMAL LOW (ref 8.9–10.3)
Chloride: 95 mmol/L — ABNORMAL LOW (ref 98–111)
Creatinine, Ser: 0.48 mg/dL (ref 0.44–1.00)
GFR, Estimated: >60 mL/min (ref >60)
Glucose, Bld: 95 mg/dL (ref 70–99)
Potassium: 3.6 mmol/L (ref 3.5–5.1)
Sodium: 135 mmol/L (ref 135–145)

## 2023-06-03 LAB — LIPASE, BLOOD: Lipase: 21 U/L (ref 11–51)

## 2023-06-03 LAB — HEPATIC FUNCTION PANEL
ALT: 40 U/L (ref 0–44)
AST: 114 U/L — ABNORMAL HIGH (ref 15–41)
Albumin: 3.9 g/dL (ref 3.5–5.0)
Alkaline Phosphatase: 102 U/L (ref 38–126)
Bilirubin, Direct: 0.2 mg/dL (ref 0.0–0.2)
Indirect Bilirubin: 0.5 mg/dL (ref 0.3–0.9)
Total Bilirubin: 0.7 mg/dL (ref 0.3–1.2)
Total Protein: 7.3 g/dL (ref 6.5–8.1)

## 2023-06-03 LAB — CBC
HCT: 37.7 % (ref 36.0–46.0)
Hemoglobin: 13.1 g/dL (ref 12.0–15.0)
MCH: 36.2 pg — ABNORMAL HIGH (ref 26.0–34.0)
MCHC: 34.7 g/dL (ref 30.0–36.0)
MCV: 104.1 fL — ABNORMAL HIGH (ref 80.0–100.0)
Platelets: 198 10*3/uL (ref 150–400)
RBC: 3.62 MIL/uL — ABNORMAL LOW (ref 3.87–5.11)
RDW: 16.7 % — ABNORMAL HIGH (ref 11.5–15.5)
WBC: 14.9 10*3/uL — ABNORMAL HIGH (ref 4.0–10.5)
nRBC: 0 % (ref 0.0–0.2)

## 2023-06-03 LAB — TROPONIN I (HIGH SENSITIVITY): Troponin I (High Sensitivity): <2 ng/L (ref <18)

## 2023-06-03 LAB — HCG, SERUM, QUALITATIVE: Preg, Serum: NEGATIVE

## 2023-06-03 MED ORDER — PIPERACILLIN-TAZOBACTAM 3.375 G IVPB 30 MIN
3.3750 g | Freq: Once | INTRAVENOUS | Status: AC
  Administered 2023-06-04: 3.375 g via INTRAVENOUS
  Filled 2023-06-03: qty 50

## 2023-06-03 MED ORDER — HYDROMORPHONE HCL 1 MG/ML IJ SOLN
1.0000 mg | Freq: Once | INTRAMUSCULAR | Status: AC
  Administered 2023-06-03: 1 mg via INTRAVENOUS
  Filled 2023-06-03: qty 1

## 2023-06-03 MED ORDER — IOHEXOL 350 MG/ML SOLN
100.0000 mL | Freq: Once | INTRAVENOUS | Status: AC | PRN
  Administered 2023-06-03: 100 mL via INTRAVENOUS

## 2023-06-03 MED ORDER — VANCOMYCIN HCL 1750 MG/350ML IV SOLN
1750.0000 mg | Freq: Once | INTRAVENOUS | Status: AC
  Administered 2023-06-04: 1750 mg via INTRAVENOUS
  Filled 2023-06-03: qty 350

## 2023-06-03 MED ORDER — ONDANSETRON HCL 4 MG/2ML IJ SOLN
4.0000 mg | Freq: Once | INTRAMUSCULAR | Status: AC
  Administered 2023-06-03: 4 mg via INTRAVENOUS
  Filled 2023-06-03: qty 2

## 2023-06-03 MED ORDER — SODIUM CHLORIDE 0.9 % IV BOLUS
1000.0000 mL | Freq: Once | INTRAVENOUS | Status: AC
  Administered 2023-06-03: 1000 mL via INTRAVENOUS

## 2023-06-03 MED ORDER — MORPHINE SULFATE (PF) 4 MG/ML IV SOLN
4.0000 mg | Freq: Once | INTRAVENOUS | Status: AC
  Administered 2023-06-03: 4 mg via INTRAVENOUS
  Filled 2023-06-03: qty 1

## 2023-06-03 NOTE — ED Triage Notes (Addendum)
Per Ems, Pt is coming from Urgent Care. Pt complaining of right sided rib pain that has been present for apprx 1 wk. Increased pain with movement. Endorse's some nausea. Pt given 500 ml of NS, 4mg  zofran, and 100 mcg of fentanyl. Pt is also working on changing some of her medications to figure out if pt needs meds for seizures or psych issues.

## 2023-06-03 NOTE — ED Provider Notes (Signed)
[place] EMERGENCY DEPARTMENT AT [facility] Provider Note   CSN: [id number] Arrival date & time: 06/03/23  1253     History  Chief Complaint  Patient presents with   Chest Pain    Marie Myers is a 32 y.o. female.  Patient with history of gastric bypass, alcohol abuse, marijuana use, seizures presents today with complaints of chest and abdominal pain. She states that her pain began 1 week ago and has been persistently worsening in the last 24 hours. Her pain is currently severe and is located in her right chest as well as her right upper abdomen. She denies any history of similar symptoms. Pain seems to localize in the right lower chest/right upper abdomen and radiate into her right shoulder. She is taking short shallow breaths due to significant pain with breathing. She also notes associated nausea and vomiting. No history of abdominal surgeries. No trauma. No recent travel or surgeries. No leg pain or leg swelling, history of blood clots, history of malignancy. Patient does note daily alcohol consumption notes she drinks about 3 beers a day, last drink was yesterday.   The history is provided by the patient. No language interpreter was used.  Chest Pain Associated symptoms: abdominal pain, nausea and vomiting        Home Medications Prior to Admission medications   Medication Sig Start Date End Date Taking? Authorizing Provider  ARIPiprazole (ABILIFY) 5 MG tablet Take 1 tablet (5 mg total) by mouth daily. 06/01/23   Shanna Cisco, NP  folic acid (FOLVITE) 1 MG tablet Take 1 tablet (1 mg total) by mouth daily. 10/05/22   Azucena Fallen, MD  gabapentin (NEURONTIN) 300 MG capsule Take 2 capsules (600 mg total) by mouth 4 (four) times daily. 10/04/22   Azucena Fallen, MD  guanFACINE (TENEX) 1 MG tablet Take 1 tablet (1 mg total) by mouth at bedtime. 05/28/22   Shanna Cisco, NP  levETIRAcetam (KEPPRA) 500 MG tablet Take 1 tablet (500 mg total) by  mouth 2 (two) times daily. 10/04/22   Azucena Fallen, MD  levETIRAcetam (KEPPRA) 500 MG tablet Take 1 tablet (500 mg total) by mouth 2 (two) times daily. 03/21/23 04/20/23  Sloan Leiter, DO  Multiple Vitamin (MULTIVITAMIN WITH MINERALS) TABS tablet Take 1 tablet by mouth daily. 10/05/22   Azucena Fallen, MD  ondansetron (ZOFRAN ODT) 4 MG disintegrating tablet Take 1 tablet (4 mg total) by mouth every 8 (eight) hours as needed for nausea or vomiting. 01/19/21   Caccavale, Sophia, PA-C  ondansetron (ZOFRAN-ODT) 4 MG disintegrating tablet Take 1 tablet (4 mg total) by mouth every 8 (eight) hours as needed for up to 15 doses for nausea or vomiting. 02/11/23   Terald Sleeper, MD  potassium chloride (KLOR-CON) 20 MEQ packet Take 40 mEq by mouth daily for 15 days. 02/11/23 02/26/23  Terald Sleeper, MD  promethazine (PHENERGAN) 25 MG suppository Place 1 suppository (25 mg total) rectally every 8 (eight) hours as needed for up to 6 days for nausea or vomiting. 02/11/23 02/17/23  Terald Sleeper, MD  sucralfate (CARAFATE) 1 GM/10ML suspension Take 10 mLs (1 g total) by mouth 4 (four) times daily -  with meals and at bedtime. 01/19/21   Caccavale, Sophia, PA-C  thiamine (VITAMIN B-1) 100 MG tablet Take 1 tablet (100 mg total) by mouth daily. 10/05/22   Azucena Fallen, MD  thiamine 100 MG tablet Take 1 tablet (100 mg total) by mouth daily.  01/14/21   Laveda Abbe, NP  traZODone (DESYREL) 100 MG tablet Take 2 tablets (200 mg total) by mouth at bedtime. 06/01/23   Shanna Cisco, NP  vitamin B-12 1000 MCG tablet Take 1 tablet (1,000 mcg total) by mouth daily. 01/14/21   Laveda Abbe, NP  Vitamin D, Ergocalciferol, (DRISDOL) 1.25 MG (50000 UNIT) CAPS capsule Take 50,000 Units by mouth every 7 (seven) days. Every tuesday    [provider]  VIVITROL 380 MG SUSR Inject 380 mg into the muscle every 28 (twenty-eight) days. 08/21/22   [provider]      Allergies     Zithromax [azithromycin]    Review of Systems   Review of Systems  Cardiovascular:  Positive for chest pain.  Gastrointestinal:  Positive for abdominal pain, nausea and vomiting.  All other systems reviewed and are negative.   Physical Exam Updated Vital Signs BP (!) 146/98   Pulse 97   Temp 99.2 F (37.3 C) (Oral)   Resp (!) 22   Ht 5\' 6"  (1.676 m)   Wt 80.7 kg   LMP 05/11/2023 (Approximate)   SpO2 95%   BMI 28.73 kg/m  Physical Exam Vitals and nursing note reviewed.  Constitutional:      General: She is not in acute distress.    Appearance: Normal appearance. She is normal weight. She is not ill-appearing, toxic-appearing or diaphoretic.  HENT:     Head: Normocephalic and atraumatic.  Cardiovascular:     Rate and Rhythm: Normal rate.  Pulmonary:     Effort: Pulmonary effort is normal. No respiratory distress.  Abdominal:     Tenderness: There is abdominal tenderness in the right upper quadrant. There is guarding.  Musculoskeletal:        General: Normal range of motion.     Cervical back: Normal range of motion.  Skin:    General: Skin is warm and dry.  Neurological:     General: No focal deficit present.     Mental Status: She is alert.  Psychiatric:        Mood and Affect: Mood normal.        Behavior: Behavior normal.     ED Results / Procedures / Treatments   Labs (all labs ordered are listed, but only abnormal results are displayed) Labs Reviewed  BASIC METABOLIC PANEL - Abnormal; Notable for the following components:      Result Value   Chloride 95 (*)    BUN <5 (*)    Calcium 8.6 (*)    Anion gap 18 (*)    All other components within normal limits  CBC - Abnormal; Notable for the following components:   WBC 14.9 (*)    RBC 3.62 (*)    MCV 104.1 (*)    MCH 36.2 (*)    RDW 16.7 (*)    All other components within normal limits  HEPATIC FUNCTION PANEL - Abnormal; Notable for the following components:   AST 114 (*)    All other components  within normal limits  HCG, SERUM, QUALITATIVE  LIPASE, BLOOD  TROPONIN I (HIGH SENSITIVITY)  TROPONIN I (HIGH SENSITIVITY)    EKG None  Radiology DG Chest 2 View  Result Date: 06/03/2023 CLINICAL DATA:  Chest pain. EXAM: CHEST - 2 VIEW COMPARISON:  02/11/2023. FINDINGS: Bilateral lung fields are clear. There is trace right pleural effusion, new since the prior study. Left lateral costophrenic angle is clear. No pneumothorax on either side. Normal cardio-mediastinal silhouette.  No acute osseous abnormalities. The soft tissues are within normal limits. IMPRESSION: *New trace right pleural effusion. Electronically Signed   By: Jules Schick M.D.   On: 06/03/2023 15:12    Procedures .Critical Care  Performed by: Silva Bandy, PA-C Authorized by: Silva Bandy, PA-C   Critical care provider statement:    Critical care time (minutes):  35   Critical care was necessary to treat or prevent imminent or life-threatening deterioration of the following conditions:  Sepsis (necrotizing pneumonia)   Critical care was time spent personally by me on the following activities:  Development of treatment plan with patient or surrogate, evaluation of patient's response to treatment, examination of patient, obtaining history from patient or surrogate, ordering and review of laboratory studies, ordering and review of radiographic studies, pulse oximetry, re-evaluation of patient's condition and review of old charts     Medications Ordered in ED Medications  HYDROmorphone (DILAUDID) injection 1 mg (1 mg Intravenous Given 06/03/23 2013)  ondansetron (ZOFRAN) injection 4 mg (4 mg Intravenous Given 06/03/23 2012)  sodium chloride 0.9 % bolus 1,000 mL (0 mLs Intravenous Stopped 06/03/23 2314)  iohexol (OMNIPAQUE) 350 MG/ML injection 100 mL (100 mLs Intravenous Contrast Given 06/03/23 2145)    ED Course/ Medical Decision Making/ A&P                                 Medical Decision Making Amount  and/or Complexity of Data Reviewed Labs: ordered. Radiology: ordered.  Risk Prescription drug management.   This patient is a 32 y.o. female who presents to the ED for concern of right lower chest/right upper abdominal pain, this involves an extensive number of treatment options, and is a complaint that carries with it a high risk of complications and morbidity. The emergent differential diagnosis prior to evaluation includes, but is not limited to,  ACS, pericarditis, myocarditis, aortic dissection, PE, pneumothorax, esophageal spasm or rupture, pneumonia, bronchitis, GERD, reflux/PUD, biliary disease, pancreatitis, costochondritis, anxiety, Gallbladder disease, PUD, Acute Hepatitis, Pancreatitis, pyelonephritis, Pneumonia, Lower lobe PE/Infarct, Kidney stone, GERD, retrocecal appendicitis, Fitz-Hugh-Curtis syndrome, Zoster.   This is not an exhaustive differential.   Past Medical History / Co-morbidities / Social History:  has a past medical history of Anxiety, Depression, H/O gastric bypass, Insomnia, Marijuana use (01/08/2021), and Moderate alcohol use disorder (HCC) (01/08/2021).  Additional history: Chart reviewed. Pertinent results include: patient seen by psychiatry yesterday  Physical Exam: Physical exam performed. The pertinent findings include: patient obviously uncomfortable, breathing, short shallow breaths at about 35 times/minute. Significant right sided chest and RUQ pain  Lab Tests: I ordered, and personally interpreted labs.  The pertinent results include:  WBC 14.9, anion gap 18. Trop WNL, AST 114. Lactic and blood cultures pending   Imaging Studies: I ordered imaging studies including CXR, CTA chest, CT abdomen pelvis. I independently visualized and interpreted imaging which showed   CXR: New trace right pleural effusion.   CTA:  1. No pulmonary embolus. 2. Elongated cavitary process in the periphery of the right upper and right lower lobe. Right upper lobe  peripheral consolidative opacity with surrounding ground-glass and central focus of air. This extends inferiorly in the subpleural space into the right lower lobe where there is a sub pulmonic air-fluid collection measuring 4.5 x 1.9 x 7 cm. There is also a small dependent right pleural effusion. Findings are most consistent with necrotizing pneumonia. Recommend follow-up imaging to resolution. 3. Prominent right  hilar node at 10 mm, likely reactive. 4. Small hiatal hernia. Bariatric surgery with gastric suture line crossing the diaphragmatic hiatus. 5. Mild dilatation of the main pulmonary artery, can be seen with pulmonary arterial hypertension.  CT:   1. No acute abnormality in the abdomen/pelvis. 2. Hepatomegaly and advanced hepatic steatosis. 3. Bariatric surgery with gastric suture line crossing the diaphragmatic hiatus and small hiatal hernia.  I agree with the radiologist interpretation.   Cardiac Monitoring:  The patient was maintained on a cardiac monitor.  My attending physician Dr. Wilkie Aye viewed and interpreted the cardiac monitored which showed an underlying rhythm of: sinus rhythm. I agree with this interpretation.   Medications: I ordered medication including dilaudid for pain, zofran for nausea and vomiting, fluids, vancomycin and zosyn for necrotizing pneumonia. Reevaluation of the patient after these medicines showed that the patient improved. I have reviewed the patients home medicines and have made adjustments as needed.  Disposition: After consideration of the diagnostic results and the patients response to treatment, I feel that patient will require admission for necrotizing pneumonia. She is on room air. Lactic and cultures pending.   Hospitalist consult and patient reassessment after imaging pending at shift change.   Care handoff to Jeanelle Malling, PA-C at shift change. Please see their note for continued evaluation and dispo.  I discussed this case with my  attending physician Dr. Wilkie Aye who cosigned this note including patient's presenting symptoms, physical exam, and planned diagnostics and interventions. Attending physician stated agreement with plan or made changes to plan which were implemented.    Final Clinical Impression(s) / ED Diagnoses Final diagnoses:  Necrotizing pneumonia [facility])    Rx / DC Orders ED Discharge Orders     None         Silva Bandy, PA-C 06/03/23 2329    Rozelle Logan, DO 06/04/23 1647

## 2023-06-03 NOTE — ED Provider Notes (Signed)
Patient care taken over at shift handoff from outgoing provider. For full HPI, please refer to previous providers note.  Plan is to reassess after CT angio chest and abdomen pelvis.  Physical Exam  BP (!) 146/98   Pulse 97   Temp 99.2 F (37.3 C) (Oral)   Resp (!) 22   Ht 5\' 6"  (1.676 m)   Wt 80.7 kg   LMP 05/11/2023 (Approximate)   SpO2 95%   BMI 28.73 kg/m   Physical Exam Vitals and nursing note reviewed.  Constitutional:      Appearance: Normal appearance.  HENT:     Head: Normocephalic and atraumatic.     Mouth/Throat:     Mouth: Mucous membranes are moist.  Eyes:     General: No scleral icterus. Cardiovascular:     Rate and Rhythm: Normal rate and regular rhythm.     Pulses: Normal pulses.     Heart sounds: Normal heart sounds.  Pulmonary:     Effort: Pulmonary effort is normal.     Breath sounds: Normal breath sounds.  Abdominal:     General: Abdomen is flat.     Palpations: Abdomen is soft.     Tenderness: There is abdominal tenderness in the right upper quadrant.  Musculoskeletal:        General: No deformity.  Skin:    General: Skin is warm.     Findings: No rash.  Neurological:     General: No focal deficit present.     Mental Status: She is alert.  Psychiatric:        Mood and Affect: Mood normal.     Procedures  Procedures  ED Course / MDM    Medical Decision Making Amount and/or Complexity of Data Reviewed Labs: ordered. Radiology: ordered.  Risk Prescription drug management. Decision regarding hospitalization.   32 year old female presents with a chief complaint of right upper quadrant and chest pain.  CT angio chest showed necrotizing pneumonia.  Suspect aspiration in settings of recent increased seizures due to ongoing EtOH use.  Less likely neoplasms or TB given history and workup.  Patient will require admission for IV antibiotics.  I spoke to Dr. Julian Reil [facility] hospitalist.  He agreed to admit the  patient.  Disposition Admit.       Jeanelle Malling, PA 06/04/23 2149    Rozelle Logan, DO 06/05/23 0030

## 2023-06-03 NOTE — Progress Notes (Signed)
A consult was received from an ED physician for Vancomycin + Zosyn per pharmacy dosing.  The patient's profile has been reviewed for ht/wt/allergies/indication/available labs.   A one time order has been placed for Zosyn 3.375gm & Vancomycin 1750mg  IV.   Further antibiotics/pharmacy consults should be ordered by admitting physician if indicated.                       Thank you, Junita Push PharmD 06/03/2023  11:19 PM

## 2023-06-04 ENCOUNTER — Inpatient Hospital Stay (HOSPITAL_COMMUNITY): Payer: Medicaid Other

## 2023-06-04 ENCOUNTER — Telehealth: Payer: Self-pay | Admitting: Acute Care

## 2023-06-04 DIAGNOSIS — R112 Nausea with vomiting, unspecified: Secondary | ICD-10-CM | POA: Diagnosis present

## 2023-06-04 DIAGNOSIS — J850 Gangrene and necrosis of lung: Secondary | ICD-10-CM | POA: Diagnosis present

## 2023-06-04 DIAGNOSIS — Z1152 Encounter for screening for COVID-19: Secondary | ICD-10-CM | POA: Diagnosis not present

## 2023-06-04 DIAGNOSIS — G40909 Epilepsy, unspecified, not intractable, without status epilepticus: Secondary | ICD-10-CM | POA: Diagnosis present

## 2023-06-04 DIAGNOSIS — K449 Diaphragmatic hernia without obstruction or gangrene: Secondary | ICD-10-CM | POA: Diagnosis present

## 2023-06-04 DIAGNOSIS — E871 Hypo-osmolality and hyponatremia: Secondary | ICD-10-CM | POA: Diagnosis not present

## 2023-06-04 DIAGNOSIS — Z833 Family history of diabetes mellitus: Secondary | ICD-10-CM | POA: Diagnosis not present

## 2023-06-04 DIAGNOSIS — Z881 Allergy status to other antibiotic agents status: Secondary | ICD-10-CM | POA: Diagnosis not present

## 2023-06-04 DIAGNOSIS — Z789 Other specified health status: Secondary | ICD-10-CM

## 2023-06-04 DIAGNOSIS — F1010 Alcohol abuse, uncomplicated: Secondary | ICD-10-CM | POA: Diagnosis present

## 2023-06-04 DIAGNOSIS — D539 Nutritional anemia, unspecified: Secondary | ICD-10-CM | POA: Diagnosis present

## 2023-06-04 DIAGNOSIS — F419 Anxiety disorder, unspecified: Secondary | ICD-10-CM | POA: Diagnosis present

## 2023-06-04 DIAGNOSIS — E876 Hypokalemia: Secondary | ICD-10-CM | POA: Diagnosis not present

## 2023-06-04 DIAGNOSIS — R160 Hepatomegaly, not elsewhere classified: Secondary | ICD-10-CM | POA: Diagnosis present

## 2023-06-04 DIAGNOSIS — E538 Deficiency of other specified B group vitamins: Secondary | ICD-10-CM | POA: Diagnosis not present

## 2023-06-04 DIAGNOSIS — D519 Vitamin B12 deficiency anemia, unspecified: Secondary | ICD-10-CM | POA: Diagnosis present

## 2023-06-04 DIAGNOSIS — F5105 Insomnia due to other mental disorder: Secondary | ICD-10-CM | POA: Diagnosis present

## 2023-06-04 DIAGNOSIS — F32A Depression, unspecified: Secondary | ICD-10-CM | POA: Diagnosis present

## 2023-06-04 DIAGNOSIS — J851 Abscess of lung with pneumonia: Secondary | ICD-10-CM | POA: Diagnosis present

## 2023-06-04 DIAGNOSIS — F1290 Cannabis use, unspecified, uncomplicated: Secondary | ICD-10-CM | POA: Diagnosis present

## 2023-06-04 DIAGNOSIS — J189 Pneumonia, unspecified organism: Secondary | ICD-10-CM | POA: Diagnosis present

## 2023-06-04 DIAGNOSIS — Z9884 Bariatric surgery status: Secondary | ICD-10-CM | POA: Diagnosis not present

## 2023-06-04 DIAGNOSIS — K760 Fatty (change of) liver, not elsewhere classified: Secondary | ICD-10-CM | POA: Diagnosis present

## 2023-06-04 DIAGNOSIS — F17290 Nicotine dependence, other tobacco product, uncomplicated: Secondary | ICD-10-CM | POA: Diagnosis present

## 2023-06-04 DIAGNOSIS — D696 Thrombocytopenia, unspecified: Secondary | ICD-10-CM | POA: Diagnosis present

## 2023-06-04 DIAGNOSIS — E861 Hypovolemia: Secondary | ICD-10-CM | POA: Diagnosis not present

## 2023-06-04 LAB — CBC WITH DIFFERENTIAL/PLATELET
Abs Immature Granulocytes: 0.08 10*3/uL — ABNORMAL HIGH (ref 0.00–0.07)
Basophils Absolute: 0 10*3/uL (ref 0.0–0.1)
Basophils Relative: 0 %
Eosinophils Absolute: 0 10*3/uL (ref 0.0–0.5)
Eosinophils Relative: 0 %
HCT: 30.3 % — ABNORMAL LOW (ref 36.0–46.0)
Hemoglobin: 10.5 g/dL — ABNORMAL LOW (ref 12.0–15.0)
Immature Granulocytes: 1 %
Lymphocytes Relative: 10 %
Lymphs Abs: 1.1 10*3/uL (ref 0.7–4.0)
MCH: 36.8 pg — ABNORMAL HIGH (ref 26.0–34.0)
MCHC: 34.7 g/dL (ref 30.0–36.0)
MCV: 106.3 fL — ABNORMAL HIGH (ref 80.0–100.0)
Monocytes Absolute: 0.8 10*3/uL (ref 0.1–1.0)
Monocytes Relative: 7 %
Neutro Abs: 9.1 10*3/uL — ABNORMAL HIGH (ref 1.7–7.7)
Neutrophils Relative %: 82 %
Platelets: 155 10*3/uL (ref 150–400)
RBC: 2.85 MIL/uL — ABNORMAL LOW (ref 3.87–5.11)
RDW: 16.8 % — ABNORMAL HIGH (ref 11.5–15.5)
WBC: 11.1 10*3/uL — ABNORMAL HIGH (ref 4.0–10.5)
nRBC: 0 % (ref 0.0–0.2)

## 2023-06-04 LAB — BLOOD CULTURE ID PANEL (REFLEXED) - BCID2

## 2023-06-04 LAB — BASIC METABOLIC PANEL
Anion gap: 13 (ref 5–15)
BUN: <5 mg/dL — ABNORMAL LOW (ref 6–20)
CO2: 24 mmol/L (ref 22–32)
Calcium: 8.3 mg/dL — ABNORMAL LOW (ref 8.9–10.3)
Chloride: 94 mmol/L — ABNORMAL LOW (ref 98–111)
Creatinine, Ser: 0.54 mg/dL (ref 0.44–1.00)
GFR, Estimated: >60 mL/min (ref >60)
Glucose, Bld: 79 mg/dL (ref 70–99)
Potassium: 3.4 mmol/L — ABNORMAL LOW (ref 3.5–5.1)
Sodium: 131 mmol/L — ABNORMAL LOW (ref 135–145)

## 2023-06-04 LAB — RESP PANEL BY RT-PCR (RSV, FLU A&B, COVID)  RVPGX2
Influenza A by PCR: NEGATIVE
Influenza B by PCR: NEGATIVE
Resp Syncytial Virus by PCR: NEGATIVE
SARS Coronavirus 2 by RT PCR: NEGATIVE

## 2023-06-04 LAB — MRSA NEXT GEN BY PCR, NASAL: MRSA by PCR Next Gen: NOT DETECTED

## 2023-06-04 LAB — HIV ANTIBODY (ROUTINE TESTING W REFLEX): HIV Screen 4th Generation wRfx: NONREACTIVE

## 2023-06-04 LAB — LACTIC ACID, PLASMA
Lactic Acid, Venous: 1.5 mmol/L (ref 0.5–1.9)
Lactic Acid, Venous: 1.7 mmol/L (ref 0.5–1.9)

## 2023-06-04 MED ORDER — HYDROXYZINE HCL 25 MG PO TABS
25.0000 mg | ORAL_TABLET | Freq: Three times a day (TID) | ORAL | Status: DC | PRN
  Administered 2023-06-04 (×3): 25 mg via ORAL
  Filled 2023-06-04 (×3): qty 1

## 2023-06-04 MED ORDER — MORPHINE SULFATE (PF) 2 MG/ML IV SOLN: 2.0000 mg | INTRAVENOUS | Status: DC | PRN

## 2023-06-04 MED ORDER — ADULT MULTIVITAMIN W/MINERALS CH
1.0000 | ORAL_TABLET | Freq: Every day | ORAL | Status: DC
  Administered 2023-06-07 – 2023-06-08 (×2): 1 via ORAL
  Filled 2023-06-04 (×4): qty 1

## 2023-06-04 MED ORDER — FENTANYL CITRATE PF 50 MCG/ML IJ SOSY
50.0000 ug | PREFILLED_SYRINGE | INTRAMUSCULAR | Status: DC | PRN
  Administered 2023-06-04 (×2): 50 ug via INTRAVENOUS
  Filled 2023-06-04 (×2): qty 1

## 2023-06-04 MED ORDER — IOHEXOL 300 MG/ML  SOLN
50.0000 mL | Freq: Once | INTRAMUSCULAR | Status: AC | PRN
  Administered 2023-06-04: 50 mL via ORAL

## 2023-06-04 MED ORDER — LEVETIRACETAM 500 MG PO TABS
500.0000 mg | ORAL_TABLET | Freq: Two times a day (BID) | ORAL | Status: DC
  Administered 2023-06-04 – 2023-06-08 (×10): 500 mg via ORAL
  Filled 2023-06-04 (×10): qty 1

## 2023-06-04 MED ORDER — TRAZODONE HCL 100 MG PO TABS
200.0000 mg | ORAL_TABLET | Freq: Every day | ORAL | Status: DC
  Administered 2023-06-04 – 2023-06-07 (×4): 200 mg via ORAL
  Filled 2023-06-04 (×4): qty 2

## 2023-06-04 MED ORDER — ACETAMINOPHEN 325 MG PO TABS
650.0000 mg | ORAL_TABLET | Freq: Four times a day (QID) | ORAL | Status: DC | PRN
  Administered 2023-06-05 – 2023-06-08 (×3): 650 mg via ORAL
  Filled 2023-06-04 (×3): qty 2

## 2023-06-04 MED ORDER — OXYCODONE HCL 5 MG PO TABS
5.0000 mg | ORAL_TABLET | ORAL | Status: DC | PRN
  Administered 2023-06-04 – 2023-06-08 (×11): 5 mg via ORAL
  Filled 2023-06-04 (×13): qty 1

## 2023-06-04 MED ORDER — LORAZEPAM 2 MG/ML IJ SOLN: 1.0000 mg | INTRAMUSCULAR | Status: DC | PRN

## 2023-06-04 MED ORDER — PANTOPRAZOLE SODIUM 40 MG IV SOLR
40.0000 mg | Freq: Two times a day (BID) | INTRAVENOUS | Status: DC
  Administered 2023-06-04 – 2023-06-06 (×6): 40 mg via INTRAVENOUS
  Filled 2023-06-04 (×6): qty 10

## 2023-06-04 MED ORDER — FOLIC ACID 1 MG PO TABS
1.0000 mg | ORAL_TABLET | Freq: Every day | ORAL | Status: DC
  Administered 2023-06-04 – 2023-06-08 (×5): 1 mg via ORAL
  Filled 2023-06-04 (×5): qty 1

## 2023-06-04 MED ORDER — ENOXAPARIN SODIUM 40 MG/0.4ML IJ SOSY
40.0000 mg | PREFILLED_SYRINGE | INTRAMUSCULAR | Status: DC
  Administered 2023-06-04 – 2023-06-08 (×5): 40 mg via SUBCUTANEOUS
  Filled 2023-06-04 (×5): qty 0.4

## 2023-06-04 MED ORDER — THIAMINE HCL 100 MG/ML IJ SOLN: 100.0000 mg | Freq: Every day | INTRAMUSCULAR | Status: DC

## 2023-06-04 MED ORDER — DIPHENHYDRAMINE HCL 50 MG/ML IJ SOLN
12.5000 mg | Freq: Once | INTRAMUSCULAR | Status: AC
  Administered 2023-06-04: 12.5 mg via INTRAVENOUS
  Filled 2023-06-04: qty 1

## 2023-06-04 MED ORDER — SODIUM CHLORIDE 0.9 % IV SOLN
3.0000 g | Freq: Four times a day (QID) | INTRAVENOUS | Status: DC
  Administered 2023-06-04 – 2023-06-08 (×18): 3 g via INTRAVENOUS
  Filled 2023-06-04 (×19): qty 8

## 2023-06-04 MED ORDER — LORAZEPAM 1 MG PO TABS: 1.0000 mg | ORAL_TABLET | ORAL | Status: DC | PRN

## 2023-06-04 MED ORDER — HYDROMORPHONE HCL 1 MG/ML IJ SOLN
0.5000 mg | INTRAMUSCULAR | Status: DC | PRN
  Administered 2023-06-04 – 2023-06-08 (×15): 0.5 mg via INTRAVENOUS
  Filled 2023-06-04 (×2): qty 0.5
  Filled 2023-06-04: qty 1
  Filled 2023-06-04 (×3): qty 0.5
  Filled 2023-06-04: qty 1
  Filled 2023-06-04 (×9): qty 0.5

## 2023-06-04 MED ORDER — CAMPHOR-MENTHOL 0.5-0.5 % EX LOTN: TOPICAL_LOTION | CUTANEOUS | Status: DC | PRN

## 2023-06-04 MED ORDER — POTASSIUM CHLORIDE CRYS ER 20 MEQ PO TBCR
40.0000 meq | EXTENDED_RELEASE_TABLET | Freq: Once | ORAL | Status: AC
  Administered 2023-06-04: 40 meq via ORAL
  Filled 2023-06-04: qty 2

## 2023-06-04 MED ORDER — ARIPIPRAZOLE 10 MG PO TABS
5.0000 mg | ORAL_TABLET | Freq: Every day | ORAL | Status: DC
  Administered 2023-06-04 – 2023-06-08 (×5): 5 mg via ORAL
  Filled 2023-06-04 (×5): qty 1

## 2023-06-04 MED ORDER — KETOROLAC TROMETHAMINE 15 MG/ML IJ SOLN
15.0000 mg | Freq: Four times a day (QID) | INTRAMUSCULAR | Status: AC | PRN
  Administered 2023-06-05 – 2023-06-06 (×3): 15 mg via INTRAVENOUS
  Filled 2023-06-04 (×3): qty 1

## 2023-06-04 MED ORDER — FENTANYL CITRATE PF 50 MCG/ML IJ SOSY
25.0000 ug | PREFILLED_SYRINGE | Freq: Once | INTRAMUSCULAR | Status: AC
  Administered 2023-06-04: 25 ug via INTRAVENOUS
  Filled 2023-06-04: qty 1

## 2023-06-04 MED ORDER — DIPHENHYDRAMINE HCL 50 MG/ML IJ SOLN
25.0000 mg | Freq: Once | INTRAMUSCULAR | Status: AC
  Administered 2023-06-04: 25 mg via INTRAVENOUS

## 2023-06-04 MED ORDER — THIAMINE MONONITRATE 100 MG PO TABS
100.0000 mg | ORAL_TABLET | Freq: Every day | ORAL | Status: DC
  Administered 2023-06-04 – 2023-06-08 (×5): 100 mg via ORAL
  Filled 2023-06-04 (×5): qty 1

## 2023-06-04 NOTE — ED Notes (Signed)
ED TO INPATIENT HANDOFF REPORT  ED Nurse Name and Phone #: Crist Infante RN [phone]  S Name/Age/Gender Marie Myers 32 y.o. female Room/Bed: WA23/WA23  Code Status   Code Status: Full Code  Home/SNF/Other Home Patient oriented to: self, place, time, and situation Is this baseline? Yes   Triage Complete: Triage complete  Chief Complaint Necrotizing pneumonia (HCC) [J85.0]  Triage Note Per Ems, Pt is coming from Urgent Care. Pt complaining of right sided rib pain that has been present for apprx 1 wk. Increased pain with movement. Endorse's some nausea. Pt given 500 ml of NS, 4mg  zofran, and 100 mcg of fentanyl. Pt is also working on changing some of her medications to figure out if pt needs meds for seizures or psych issues.    Allergies Allergies  Allergen Reactions   Zithromax [Azithromycin] Hives    Level of Care/Admitting Diagnosis ED Disposition     ED Disposition  Admit   Condition  --   Comment  Hospital Area: [facility] [[id number]]  Level of Care: Med-Surg [16]  May admit patient to [facility] or [facility] if equivalent level of care is available:: No  Covid Evaluation: Confirmed COVID Negative  Diagnosis: Necrotizing pneumonia [facility]) [[id number]]  Admitting Physician: Osvaldo Shipper [[id number]]  Attending Physician: Osvaldo Shipper [[id number]]  Certification:: I certify this patient will need inpatient services for at least 2 midnights  Expected Medical Readiness: 06/08/2023          B Medical/Surgery History Past Medical History:  Diagnosis Date   Anxiety    Depression    H/O gastric bypass    Insomnia    Marijuana use 01/08/2021   Moderate alcohol use disorder (HCC) 01/08/2021   Past Surgical History:  Procedure Laterality Date   CHOLECYSTECTOMY     GASTRIC BYPASS     GASTRIC BYPASS     LAPAROSCOPY N/A 08/11/2019   Procedure: LAPAROSCOPY DIAGNOSTIC;  Surgeon: Harriette Bouillon, MD;  Location: [facility] OR;  Service: General;  Laterality: N/A;      A IV Location/Drains/Wounds Patient Lines/Drains/Airways Status     Active Line/Drains/Airways     Name Placement date Placement time Site Days   Peripheral IV 06/04/23 20 G 2.5" Anterior;Distal;Left;Upper Arm 06/04/23  0830  Arm  less than 1   Wound / Incision (Open or Dehisced) 10/17/20 Puncture Ankle Anterior;Left small puncture from fall 10/17/20  0225  Ankle  960   Wound / Incision (Open or Dehisced) 10/03/22 Laceration Face Left;Upper sutured laceration above left eye 10/03/22  2300  Face  244   Wound / Incision (Open or Dehisced) 10/03/22 Laceration Lip Lower closed laceration 10/03/22  2300  Lip  244            Intake/Output Last 24 hours  Intake/Output Summary (Last 24 hours) at 06/04/2023 1710 Last data filed at 06/04/2023 1326 Gross per 24 hour  Intake 200 ml  Output --  Net 200 ml    Labs/Imaging Results for orders placed or performed during the hospital encounter of 06/03/23 (from the past 48 hour(s))  Basic metabolic panel     Status: Abnormal   Collection Time: 06/03/23  2:33 PM  Result Value Ref Range   Sodium 135 135 - 145 mmol/L   Potassium 3.6 3.5 - 5.1 mmol/L   Chloride 95 (L) 98 - 111 mmol/L   CO2 22 22 - 32 mmol/L   Glucose, Bld 95 70 - 99 mg/dL    Comment: Glucose reference range applies only to  samples taken after fasting for at least 8 hours.   BUN <5 (L) 6 - 20 mg/dL   Creatinine, Ser 6.57 0.44 - 1.00 mg/dL   Calcium 8.6 (L) 8.9 - 10.3 mg/dL   GFR, Estimated >84 >69 mL/min    Comment: (NOTE) Calculated using the CKD-EPI Creatinine Equation (2021)    Anion gap 18 (H) 5 - 15    Comment: Performed at [facility], [address], [place], [place] [address]  CBC     Status: Abnormal   Collection Time: 06/03/23  2:33 PM  Result Value Ref Range   WBC 14.9 (H) 4.0 - 10.5 K/uL   RBC 3.62 (L) 3.87 - 5.11 MIL/uL   Hemoglobin 13.1 12.0 - 15.0 g/dL   HCT 84.1 32.4 - 40.1 %   MCV 104.1 (H) 80.0 - 100.0 fL   MCH 36.2 (H) 26.0  - 34.0 pg   MCHC 34.7 30.0 - 36.0 g/dL   RDW 02.7 (H) 25.3 - 66.4 %   Platelets 198 150 - 400 K/uL   nRBC 0.0 0.0 - 0.2 %    Comment: Performed at [facility], [address], [place], [place] [address]  Troponin I (High Sensitivity)     Status: None   Collection Time: 06/03/23  2:33 PM  Result Value Ref Range   Troponin I (High Sensitivity) <2 <18 ng/L    Comment: (NOTE) Elevated high sensitivity troponin I (hsTnI) values and significant  changes across serial measurements may suggest ACS but many other  chronic and acute conditions are known to elevate hsTnI results.  Refer to the "Links" section for chest pain algorithms and additional  guidance. Performed at [facility], [address] [place], [place] [address]   hCG, serum, qualitative     Status: None   Collection Time: 06/03/23  2:33 PM  Result Value Ref Range   Preg, Serum NEGATIVE NEGATIVE    Comment:        THE SENSITIVITY OF THIS METHODOLOGY IS >10 mIU/mL. Performed at [facility], [address], [place], [place] [address]   Hepatic function panel     Status: Abnormal   Collection Time: 06/03/23  2:33 PM  Result Value Ref Range   Total Protein 7.3 6.5 - 8.1 g/dL   Albumin 3.9 3.5 - 5.0 g/dL   AST 643 (H) 15 - 41 U/L   ALT 40 0 - 44 U/L   Alkaline Phosphatase 102 38 - 126 U/L   Total Bilirubin 0.7 0.3 - 1.2 mg/dL   Bilirubin, Direct 0.2 0.0 - 0.2 mg/dL   Indirect Bilirubin 0.5 0.3 - 0.9 mg/dL    Comment: Performed at [facility], [address], [place], [place] [address]  Lipase, blood     Status: None   Collection Time: 06/03/23  2:33 PM  Result Value Ref Range   Lipase 21 11 - 51 U/L    Comment: Performed at [facility], [address], [place], [place] [address]  Resp panel by RT-PCR (RSV, Flu A&B, Covid) Anterior Nasal Swab     Status: None   Collection Time: 06/03/23 11:35 PM   Specimen: Anterior Nasal  Swab  Result Value Ref Range   SARS Coronavirus 2 by RT PCR NEGATIVE NEGATIVE    Comment: (NOTE) SARS-CoV-2 target nucleic acids are NOT DETECTED.  The SARS-CoV-2 RNA is generally detectable in upper respiratory specimens during the acute phase of infection. The lowest  concentration of SARS-CoV-2 viral copies this assay can detect is 138 copies/mL. A negative result does not preclude SARS-Cov-2 infection and should not be used as the sole basis for treatment or other patient management decisions. A negative result may occur with  improper specimen collection/handling, submission of specimen other than nasopharyngeal swab, presence of viral mutation(s) within the areas targeted by this assay, and inadequate number of viral copies(<138 copies/mL). A negative result must be combined with clinical observations, patient history, and epidemiological information. The expected result is Negative.  Fact Sheet for Patients:  BloggerCourse.com  Fact Sheet for Healthcare Providers:  SeriousBroker.it  This test is no t yet approved or cleared by the [place] FDA and  has been authorized for detection and/or diagnosis of SARS-CoV-2 by FDA under an Emergency Use Authorization (EUA). This EUA will remain  in effect (meaning this test can be used) for the duration of the COVID-19 declaration under Section 564(b)(1) of the Act, 21 U.S.C.section 360bbb-3(b)(1), unless the authorization is terminated  or revoked sooner.       Influenza A by PCR NEGATIVE NEGATIVE   Influenza B by PCR NEGATIVE NEGATIVE    Comment: (NOTE) The Xpert Xpress SARS-CoV-2/FLU/RSV plus assay is intended as an aid in the diagnosis of influenza from Nasopharyngeal swab specimens and should not be used as a sole basis for treatment. Nasal washings and aspirates are unacceptable for Xpert Xpress SARS-CoV-2/FLU/RSV testing.  Fact Sheet for  Patients: BloggerCourse.com  Fact Sheet for Healthcare Providers: SeriousBroker.it  This test is not yet approved or cleared by the [place] FDA and has been authorized for detection and/or diagnosis of SARS-CoV-2 by FDA under an Emergency Use Authorization (EUA). This EUA will remain in effect (meaning this test can be used) for the duration of the COVID-19 declaration under Section 564(b)(1) of the Act, 21 U.S.C. section 360bbb-3(b)(1), unless the authorization is terminated or revoked.     Resp Syncytial Virus by PCR NEGATIVE NEGATIVE    Comment: (NOTE) Fact Sheet for Patients: BloggerCourse.com  Fact Sheet for Healthcare Providers: SeriousBroker.it  This test is not yet approved or cleared by the [place] FDA and has been authorized for detection and/or diagnosis of SARS-CoV-2 by FDA under an Emergency Use Authorization (EUA). This EUA will remain in effect (meaning this test can be used) for the duration of the COVID-19 declaration under Section 564(b)(1) of the Act, 21 U.S.C. section 360bbb-3(b)(1), unless the authorization is terminated or revoked.  Performed at [facility], [address] [place], [place] [address]   Lactic acid, plasma     Status: None   Collection Time: 06/03/23 11:35 PM  Result Value Ref Range   Lactic Acid, Venous 1.7 0.5 - 1.9 mmol/L    Comment: Performed at [facility], [address], [place], [place] [address]  Blood culture (routine x 2)     Status: None (Preliminary result)   Collection Time: 06/03/23 11:53 PM   Specimen: BLOOD RIGHT HAND  Result Value Ref Range   Specimen Description      BLOOD RIGHT HAND Performed at [facility], [address] [place], [place] [address]    Special Requests      BOTTLES DRAWN AEROBIC AND ANAEROBIC Blood Culture adequate  volume Performed at [facility], [address], [place], [place] [address]    Culture      NO GROWTH < 12 HOURS Performed at [facility], [address]  [place], [place] [address]    Report Status PENDING   Blood culture (routine x 2)     Status: None (Preliminary result)   Collection Time: 06/04/23 12:00 AM   Specimen: BLOOD LEFT HAND  Result Value Ref Range   Specimen Description      BLOOD LEFT HAND Performed at [facility], [address], [place], [place] [address]    Special Requests      BOTTLES DRAWN AEROBIC AND ANAEROBIC Blood Culture results may not be optimal due to an excessive volume of blood received in culture bottles Performed at [facility], [address] [place], [place] [address]    Culture      NO GROWTH < 12 HOURS Performed at [facility], [address] [place], [place] [address]    Report Status PENDING   Lactic acid, plasma     Status: None   Collection Time: 06/04/23  2:11 AM  Result Value Ref Range   Lactic Acid, Venous 1.5 0.5 - 1.9 mmol/L    Comment: Performed at [facility] [address], [place], [place] [address]  MRSA Next Gen by PCR, Nasal     Status: None   Collection Time: 06/04/23  2:12 AM   Specimen: Nasal Mucosa; Nasal Swab  Result Value Ref Range   MRSA by PCR Next Gen NOT DETECTED NOT DETECTED    Comment: (NOTE) The GeneXpert MRSA Assay (FDA approved for NASAL specimens only), is one component of a comprehensive MRSA colonization surveillance program. It is not intended to diagnose MRSA infection nor to guide or monitor treatment for MRSA infections. Test performance is not FDA approved in patients less than 40 years old. Performed at [facility], [address] [place], [place] [address]   Basic metabolic panel     Status: Abnormal   Collection Time: 06/04/23  8:34 AM  Result Value Ref Range   Sodium 131 (L)  135 - 145 mmol/L   Potassium 3.4 (L) 3.5 - 5.1 mmol/L   Chloride 94 (L) 98 - 111 mmol/L   CO2 24 22 - 32 mmol/L   Glucose, Bld 79 70 - 99 mg/dL    Comment: Glucose reference range applies only to samples taken after fasting for at least 8 hours.   BUN <5 (L) 6 - 20 mg/dL   Creatinine, Ser 5.78 0.44 - 1.00 mg/dL   Calcium 8.3 (L) 8.9 - 10.3 mg/dL   GFR, Estimated >46 >96 mL/min    Comment: (NOTE) Calculated using the CKD-EPI Creatinine Equation (2021)    Anion gap 13 5 - 15    Comment: Performed at [facility], [address] [place], [place] [address]  CBC with Differential/Platelet     Status: Abnormal   Collection Time: 06/04/23  8:34 AM  Result Value Ref Range   WBC 11.1 (H) 4.0 - 10.5 K/uL   RBC 2.85 (L) 3.87 - 5.11 MIL/uL   Hemoglobin 10.5 (L) 12.0 - 15.0 g/dL   HCT 41.3 (L) 24.4 - 01.0 %   MCV 106.3 (H) 80.0 - 100.0 fL   MCH 36.8 (H) 26.0 - 34.0 pg   MCHC 34.7 30.0 - 36.0 g/dL   RDW 27.2 (H) 53.6 - 64.4 %   Platelets 155 150 - 400 K/uL   nRBC 0.0 0.0 - 0.2 %   Neutrophils Relative % 82 %   Neutro Abs 9.1 (H) 1.7 - 7.7 K/uL   Lymphocytes Relative  10 %   Lymphs Abs 1.1 0.7 - 4.0 K/uL   Monocytes Relative 7 %   Monocytes Absolute 0.8 0.1 - 1.0 K/uL   Eosinophils Relative 0 %   Eosinophils Absolute 0.0 0.0 - 0.5 K/uL   Basophils Relative 0 %   Basophils Absolute 0.0 0.0 - 0.1 K/uL   Immature Granulocytes 1 %   Abs Immature Granulocytes 0.08 (H) 0.00 - 0.07 K/uL    Comment: Performed at [facility], [address] [place], [place] [address]  HIV Antibody (routine testing w rflx)     Status: None   Collection Time: 06/04/23  8:34 AM  Result Value Ref Range   HIV Screen 4th Generation wRfx Non Reactive Non Reactive    Comment: Performed at [facility], [address] [place], [place] [address]   DG UGI W SINGLE CM (SOL OR THIN BA)  Result Date: 06/04/2023 CLINICAL DATA:  History of gastric bypass and right-sided  necrotizing pneumonia. Rule out esophageal leak. EXAM: WATER SOLUBLE UPPER GI SERIES TECHNIQUE: Single-column upper GI series was performed using water soluble contrast. Radiation Exposure Index (as provided by the fluoroscopic device): 14.5 mGy Kerma CONTRAST:  50 cc of Omnipaque 300 COMPARISON:  Yesterday's chest abdomen and pelvic CTs FINDINGS: Patient was unable to tolerate oblique or prone imaging. Single-contrast focused exam was performed with the patient supine. Preprocedure scout film is unremarkable. Left proximal femur fixation. Degenerative changes of the right hip are significantly age advanced. 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 Normal esophageal caliber. No significant hiatal hernia. Contrast stasis throughout the esophagus suggests a component of dysmotility. No contrast extravasation to suggest esophageal perforation. IMPRESSION: No evidence of esophageal perforation or significant hiatal hernia. Limited single-contrast exam performed with the patient supine secondary to immobility as detailed above. Electronically Signed   By: Jeronimo Greaves M.D.   On: 06/04/2023 15:46   CT ABDOMEN PELVIS W CONTRAST  Result Date: 06/03/2023 CLINICAL DATA:  Acute abdominal pain. EXAM: CT ABDOMEN AND PELVIS WITH CONTRAST TECHNIQUE: Multidetector CT imaging of the abdomen and pelvis was performed using the standard protocol following bolus administration of intravenous contrast. RADIATION DOSE REDUCTION: This exam was performed according to the departmental dose-optimization program which includes automated exposure control, adjustment of the mA and/or kV according to patient size and/or use of iterative reconstruction technique. CONTRAST:  OMNIPAQUE IOHEXOL 350 MG/ML SOLN COMPARISON:  CT 02/11/2023 FINDINGS: Lower chest: Assessed on concurrent chest CT, reported separately. Hepatobiliary: Advanced hepatic steatosis. More focal fatty infiltration adjacent to the falciform ligament. The liver  is enlarged spanning 19.6 cm cranial caudal. Clips in the gallbladder fossa postcholecystectomy. No biliary dilatation. Pancreas: Unremarkable. No pancreatic ductal dilatation or surrounding inflammatory changes. Spleen: Normal in size without focal abnormality. Adrenals/Urinary Tract: No adrenal nodule. No hydronephrosis, renal inflammation or focal renal abnormality. Moderately distended bladder, normal for degree of distension. 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 The excluded gastric remnant is minimally distended with fluid. The Roux limb is nondilated. The jejunal anastomosis is unremarkable. There is no bowel obstruction or inflammatory change. Small volume of stool in the colon. Normal appendix visualized. Vascular/Lymphatic: No acute vascular findings. Normal caliber abdominal aorta. Patent portal vein. No abdominopelvic adenopathy. Reproductive: Uterus and bilateral adnexa are unremarkable. Other: No free air, free fluid, or intra-abdominal fluid collection. Musculoskeletal: There are no acute or suspicious osseous abnormalities. Surgical screw traverses the left intertrochanteric femur. Chronic changes  of the bilateral hips. IMPRESSION: 1. No acute abnormality in the abdomen/pelvis. 2. Hepatomegaly and advanced hepatic steatosis. 3. Bariatric surgery with gastric suture line crossing the diaphragmatic hiatus and small hiatal hernia. Electronically Signed   By: Narda Rutherford M.D.   On: 06/03/2023 22:52   CT Angio Chest PE W and/or Wo Contrast  Result Date: 06/03/2023 CLINICAL DATA:  Pulmonary embolism (PE) suspected, high prob Right-sided pain. EXAM: CT ANGIOGRAPHY CHEST WITH CONTRAST TECHNIQUE: Multidetector CT imaging of the chest was performed using the standard protocol during bolus administration of intravenous contrast. Multiplanar CT image reconstructions and MIPs were obtained to evaluate the vascular anatomy.  RADIATION DOSE REDUCTION: This exam was performed according to the departmental dose-optimization program which includes automated exposure control, adjustment of the mA and/or kV according to patient size and/or use of iterative reconstruction technique. CONTRAST:  OMNIPAQUE IOHEXOL 350 MG/ML SOLN COMPARISON:  Chest radiograph earlier today.  Chest CT 10/16/2020 FINDINGS: Cardiovascular: There are no filling defects within the pulmonary arteries to suggest pulmonary embolus. Evaluation is diagnostic to the segmental level. The subsegmental branches are not well assessed due to phase of contrast. Mild dilatation of the main pulmonary artery at 3 cm. Thoracic aorta is normal in caliber. Common origin of the brachiocephalic and left common carotid artery, variant anatomy. The heart is upper normal in size. No pericardial effusion. Mediastinum/Nodes: Prominent right hilar node at 10 mm. No mediastinal adenopathy. There is a small hiatal hernia. Bariatric surgery with gastric suture line crossing the diaphragmatic hiatus. Lungs/Pleura: Elongated cavitary process in the periphery of the right upper and right lower lobe. Above the major fissure there is a peripheral consolidative opacity with surrounding ground-glass and central focus of air. This extends inferiorly in the subpleural space into the right lower lobe where there is a sub pulmonic air-fluid collection measuring 4.5 x 1.9 x 7 cm, series 12, image 69 there is also a small dependent right pleural effusion. The left lung is clear. There are no features of pulmonary edema. Trachea and central airways are clear. Upper Abdomen: Assessed on concurrent abdominopelvic CT, reported separately. Musculoskeletal: There are no acute or suspicious osseous abnormalities. No rib fracture, destructive or erosive changes, particularly of right ribs. Review of the MIP images confirms the above findings. IMPRESSION: 1. No pulmonary embolus. 2. Elongated cavitary process in  the periphery of the right upper and right lower lobe. Right upper lobe peripheral consolidative opacity with surrounding ground-glass and central focus of air. This extends inferiorly in the subpleural space into the right lower lobe where there is a sub pulmonic air-fluid collection measuring 4.5 x 1.9 x 7 cm. There is also a small dependent right pleural effusion. Findings are most consistent with necrotizing pneumonia. Recommend follow-up imaging to resolution. 3. Prominent right hilar node at 10 mm, likely reactive. 4. Small hiatal hernia. Bariatric surgery with gastric suture line crossing the diaphragmatic hiatus. 5. Mild dilatation of the main pulmonary artery, can be seen with pulmonary arterial hypertension. Electronically Signed   By: Narda Rutherford M.D.   On: 06/03/2023 22:49   DG Chest 2 View  Result Date: 06/03/2023 CLINICAL DATA:  Chest pain. EXAM: CHEST - 2 VIEW COMPARISON:  02/11/2023. FINDINGS: Bilateral lung fields are clear. There is trace right pleural effusion, new since the prior study. Left lateral costophrenic angle is clear. No pneumothorax on either side. Normal cardio-mediastinal silhouette. No acute osseous abnormalities. The soft tissues are within normal limits. IMPRESSION: *New trace right pleural effusion. Electronically Signed  By: Jules Schick M.D.   On: 06/03/2023 15:12    Pending Labs Unresulted Labs (From admission, onward)     Start     Ordered   06/04/23 1055  Expectorated Sputum Assessment w Gram Stain, Rflx to Resp Cult  Once,   R        06/04/23 1054   06/04/23 0930  QuantiFERON-TB Gold Plus  Once,   R        06/04/23 0930            Vitals/Pain Today's Vitals   06/04/23 1325 06/04/23 1429 06/04/23 1434 06/04/23 1608  BP:  136/83    Pulse:  87    Resp:  16    Temp: 98.6 F (37 C)     TempSrc: Oral     SpO2:  96%    Weight:      Height:      PainSc: 9   4  6      Isolation Precautions Airborne and Contact  precautions  Medications Medications  enoxaparin (LOVENOX) injection 40 mg (40 mg Subcutaneous Given 06/04/23 0905)  Ampicillin-Sulbactam (UNASYN) 3 g in sodium chloride 0.9 % 100 mL IVPB (0 g Intravenous Stopped 06/04/23 1326)  levETIRAcetam (KEPPRA) tablet 500 mg (500 mg Oral Given 06/04/23 0903)  LORazepam (ATIVAN) tablet 1-4 mg (has no administration in time range)    Or  LORazepam (ATIVAN) injection 1-4 mg (has no administration in time range)  thiamine (VITAMIN B1) tablet 100 mg (100 mg Oral Given 06/04/23 0903)    Or  thiamine (VITAMIN B1) injection 100 mg ( Intravenous See Alternative 06/04/23 0903)  folic acid (FOLVITE) tablet 1 mg (1 mg Oral Given 06/04/23 0904)  multivitamin with minerals tablet 1 tablet (1 tablet Oral Patient Refused/Not Given 06/04/23 0913)  traZODone (DESYREL) tablet 200 mg (has no administration in time range)  ARIPiprazole (ABILIFY) tablet 5 mg (5 mg Oral Given 06/04/23 0903)  camphor-menthol (SARNA) lotion (has no administration in time range)  hydrOXYzine (ATARAX) tablet 25 mg (25 mg Oral Given 06/04/23 1701)  pantoprazole (PROTONIX) injection 40 mg (40 mg Intravenous Given 06/04/23 0905)  HYDROmorphone (DILAUDID) injection 0.5 mg (0.5 mg Intravenous Given 06/04/23 1702)  acetaminophen (TYLENOL) tablet 650 mg (has no administration in time range)  oxyCODONE (Oxy IR/ROXICODONE) immediate release tablet 5 mg (5 mg Oral Given 06/04/23 1536)  HYDROmorphone (DILAUDID) injection 1 mg (1 mg Intravenous Given 06/03/23 2013)  ondansetron (ZOFRAN) injection 4 mg (4 mg Intravenous Given 06/03/23 2012)  sodium chloride 0.9 % bolus 1,000 mL (0 mLs Intravenous Stopped 06/03/23 2314)  iohexol (OMNIPAQUE) 350 MG/ML injection 100 mL (100 mLs Intravenous Contrast Given 06/03/23 2145)  piperacillin-tazobactam (ZOSYN) IVPB 3.375 g (0 g Intravenous Stopped 06/04/23 0036)  vancomycin (VANCOREADY) IVPB 1750 mg/350 mL (0 mg Intravenous Stopped 06/04/23 0346)  morphine (PF) 4  MG/ML injection 4 mg (4 mg Intravenous Given 06/03/23 2343)  fentaNYL (SUBLIMAZE) injection 25 mcg (25 mcg Intravenous Given 06/04/23 0033)  diphenhydrAMINE (BENADRYL) injection 12.5 mg (12.5 mg Intravenous Given 06/04/23 0033)  diphenhydrAMINE (BENADRYL) injection 25 mg (25 mg Intravenous Given 06/04/23 0157)  potassium chloride SA (KLOR-CON M) CR tablet 40 mEq (40 mEq Oral Given 06/04/23 1109)  iohexol (OMNIPAQUE) 300 MG/ML solution 50 mL (50 mLs Oral Contrast Given 06/04/23 1537)    Mobility walks     Focused Assessments Neuro Assessment Handoff:  Swallow screen pass?  N/A Cardiac Rhythm: Sinus tachycardia       Neuro Assessment:   Neuro Checks:  Has TPA been given? No If patient is a Neuro Trauma and patient is going to OR before floor call report to 4N Charge nurse: [phone] or [phone]   R Recommendations: See Admitting Provider Note  Report given to:   Additional Notes:

## 2023-06-04 NOTE — Assessment & Plan Note (Addendum)
Necrotizing PNA / pulmonary abscess.  Most likely I suspect aspiration / microaspiration in setting of recent increased seizures due to ongoing EtOH use (as she reported to her psychiatrist just a couple of days ago). DDx includes TB, but seems very inferior in lung for this, neoplasm also possible but seems less likely. Empiric Unasyn MRSA PCR is negative Quantiferon pending Airborne precautions until we get quantiferon back, just-in-case PNA pathway Repeat CBC in AM HIV pending Will defer to day team if they feel full pulm consult is needed

## 2023-06-04 NOTE — Progress Notes (Signed)
PHARMACY - PHYSICIAN COMMUNICATION CRITICAL VALUE ALERT - BLOOD CULTURE IDENTIFICATION (BCID)  Marie Myers is an 32 y.o. female who presented to [facility] on 06/03/2023 with a chief complaint of chest pain.  Assessment:  Admit with necrotizing PNA.   Anaerobic bottle of 1 blood cx set + GPCC; BCID + CoNS. Possible contaminant vs. True pathogen? She is afebrile & hemodynamically stable on current regimen.   Name of physician (or Provider) Contacted: [place]  Current antibiotics: Unasyn  Changes to prescribed antibiotics recommended:  Continue current antibiotics.  F/U final cx data, clinical course.   Results for orders placed or performed during the hospital encounter of 06/03/23  Blood Culture ID Panel (Reflexed) (Collected: 06/03/2023 11:53 PM)  Result Value Ref Range   Enterococcus faecalis NOT DETECTED NOT DETECTED   Enterococcus Faecium NOT DETECTED NOT DETECTED   Listeria monocytogenes NOT DETECTED NOT DETECTED   Staphylococcus species DETECTED (A) NOT DETECTED   Staphylococcus aureus (BCID) NOT DETECTED NOT DETECTED   Staphylococcus epidermidis NOT DETECTED NOT DETECTED   Staphylococcus lugdunensis NOT DETECTED NOT DETECTED   Streptococcus species NOT DETECTED NOT DETECTED   Streptococcus agalactiae NOT DETECTED NOT DETECTED   Streptococcus pneumoniae NOT DETECTED NOT DETECTED   Streptococcus pyogenes NOT DETECTED NOT DETECTED   A.calcoaceticus-baumannii NOT DETECTED NOT DETECTED   Bacteroides fragilis NOT DETECTED NOT DETECTED   Enterobacterales NOT DETECTED NOT DETECTED   Enterobacter cloacae complex NOT DETECTED NOT DETECTED   Escherichia coli NOT DETECTED NOT DETECTED   Klebsiella aerogenes NOT DETECTED NOT DETECTED   Klebsiella oxytoca NOT DETECTED NOT DETECTED   Klebsiella pneumoniae NOT DETECTED NOT DETECTED   Proteus species NOT DETECTED NOT DETECTED   Salmonella species NOT DETECTED NOT DETECTED   Serratia marcescens NOT DETECTED NOT DETECTED    Haemophilus influenzae NOT DETECTED NOT DETECTED   Neisseria meningitidis NOT DETECTED NOT DETECTED   Pseudomonas aeruginosa NOT DETECTED NOT DETECTED   Stenotrophomonas maltophilia NOT DETECTED NOT DETECTED   Candida albicans NOT DETECTED NOT DETECTED   Candida auris NOT DETECTED NOT DETECTED   Candida glabrata NOT DETECTED NOT DETECTED   Candida krusei NOT DETECTED NOT DETECTED   Candida parapsilosis NOT DETECTED NOT DETECTED   Candida tropicalis NOT DETECTED NOT DETECTED   Cryptococcus neoformans/gattii NOT DETECTED NOT DETECTED    Junita Push PharmD 06/04/2023  10:36 PM

## 2023-06-04 NOTE — ED Notes (Addendum)
RN s/w Dr Gerrit Friends and was advised to tell patient that there was no leak seen and no obstruction, patient informed, clear understanding voiced.

## 2023-06-04 NOTE — ED Notes (Signed)
Let the admitting doctor know that the labs would need to be phlebotomy. Patient was hard stick and unable to draw off lines. And patient needs special tubes drawn by lab as well.

## 2023-06-04 NOTE — H&P (Signed)
History and Physical    Patient: Marie Myers ZOX:[id number] DOB: 1990-10-02 DOA: 06/03/2023 DOS: the patient was seen and examined on 06/04/2023 PCP: Delfino Lovett, FNP  Patient coming from: Home  Chief Complaint:  Chief Complaint  Patient presents with   Chest Pain   HPI: Marie Myers is a 32 y.o. female with medical history significant of depression, EtOH use (psychiatrist concerned about EtOH abuse), seizure disorder on keppra.  Pt with most recent seizure x2 weeks ago.  ~1 week ago began to develop R sided lower chest pain.  Worse with movement or deep breaths.  This is now severe after persistently worsening for the past 24h or so.  Associated nausea and vomiting.  Drinks 3 beers a day, last drink yesterday.  No trauma, no recent surgeries, no travel.  Review of Systems: As mentioned in the history of present illness. All other systems reviewed and are negative. Past Medical History:  Diagnosis Date   Anxiety    Depression    H/O gastric bypass    Insomnia    Marijuana use 01/08/2021   Moderate alcohol use disorder (HCC) 01/08/2021   Past Surgical History:  Procedure Laterality Date   CHOLECYSTECTOMY     GASTRIC BYPASS     GASTRIC BYPASS     LAPAROSCOPY N/A 08/11/2019   Procedure: LAPAROSCOPY DIAGNOSTIC;  Surgeon: Harriette Bouillon, MD;  Location: [facility] OR;  Service: General;  Laterality: N/A;   Social History:  reports that she has never smoked. She has never used smokeless tobacco. She reports current alcohol use of about 1.0 standard drink of alcohol per week. She reports current drug use. Drug: Marijuana.  Allergies  Allergen Reactions   Zithromax [Azithromycin] Hives    Family History  Problem Relation Age of Onset   Diabetes Mother    Diabetes Father     Prior to Admission medications   Medication Sig Start Date End Date Taking? Authorizing Provider  levETIRAcetam (KEPPRA) 500 MG tablet Take 1 tablet (500 mg total) by mouth 2 (two) times daily.  10/04/22  Yes Azucena Fallen, MD  traZODone (DESYREL) 100 MG tablet Take 2 tablets (200 mg total) by mouth at bedtime. 06/01/23  Yes Toy Cookey E, NP  ARIPiprazole (ABILIFY) 5 MG tablet Take 1 tablet (5 mg total) by mouth daily. 06/01/23   Shanna Cisco, NP  levETIRAcetam (KEPPRA) 500 MG tablet Take 1 tablet (500 mg total) by mouth 2 (two) times daily. 03/21/23 04/20/23  Sloan Leiter, DO  potassium chloride (KLOR-CON) 20 MEQ packet Take 40 mEq by mouth daily for 15 days. 02/11/23 02/26/23  Terald Sleeper, MD  promethazine (PHENERGAN) 25 MG suppository Place 1 suppository (25 mg total) rectally every 8 (eight) hours as needed for up to 6 days for nausea or vomiting. 02/11/23 02/17/23  Terald Sleeper, MD    Physical Exam: Vitals:   06/03/23 2030 06/04/23 0030 06/04/23 0038 06/04/23 0300  BP: (!) 146/98 (!) 154/98  (!) 148/93  Pulse: 97 (!) 116  97  Resp: (!) 22 (!) 23    Temp:   98.5 F (36.9 C)   TempSrc:   Oral   SpO2: 95% 99%    Weight:      Height:       Constitutional: Uncomfortable with R sided pleuritic CP. Respiratory: clear to auscultation bilaterally, no wheezing, no crackles. Normal respiratory effort. No accessory muscle use.  Cardiovascular: Regular rate and rhythm, no murmurs / rubs / gallops. No extremity edema. 2+ pedal  pulses. No carotid bruits.  Abdomen: RUQ TTP Neurologic: CN 2-12 grossly intact. Sensation intact, DTR normal. Strength 5/5 in all 4.  Psychiatric: Normal judgment and insight. Alert and oriented x 3. Normal mood.   Data Reviewed:    Labs on Admission: I have personally reviewed following labs and imaging studies  CBC: Recent Labs  Lab 06/03/23 1433  WBC 14.9*  HGB 13.1  HCT 37.7  MCV 104.1*  PLT 198   Basic Metabolic Panel: Recent Labs  Lab 06/03/23 1433  NA 135  K 3.6  CL 95*  CO2 22  GLUCOSE 95  BUN <5*  CREATININE 0.48  CALCIUM 8.6*   GFR: Estimated Creatinine Clearance: 109.2 mL/min (by C-G formula based on  SCr of 0.48 mg/dL). Liver Function Tests: Recent Labs  Lab 06/03/23 1433  AST 114*  ALT 40  ALKPHOS 102  BILITOT 0.7  PROT 7.3  ALBUMIN 3.9   Recent Labs  Lab 06/03/23 1433  LIPASE 21   No results for input(s): "AMMONIA" in the last 168 hours. Coagulation Profile: No results for input(s): "INR", "PROTIME" in the last 168 hours. Cardiac Enzymes: No results for input(s): "CKTOTAL", "CKMB", "CKMBINDEX", "TROPONINI" in the last 168 hours. BNP (last 3 results) No results for input(s): "PROBNP" in the last 8760 hours. HbA1C: No results for input(s): "HGBA1C" in the last 72 hours. CBG: No results for input(s): "GLUCAP" in the last 168 hours. Lipid Profile: No results for input(s): "CHOL", "HDL", "LDLCALC", "TRIG", "CHOLHDL", "LDLDIRECT" in the last 72 hours. Thyroid Function Tests: No results for input(s): "TSH", "T4TOTAL", "FREET4", "T3FREE", "THYROIDAB" in the last 72 hours. Anemia Panel: No results for input(s): "VITAMINB12", "FOLATE", "FERRITIN", "TIBC", "IRON", "RETICCTPCT" in the last 72 hours. Urine analysis:    Component Value Date/Time   COLORURINE AMBER (A) 01/12/2022 1747   APPEARANCEUR HAZY (A) 01/12/2022 1747   LABSPEC 1.018 01/12/2022 1747   PHURINE 5.0 01/12/2022 1747   GLUCOSEU NEGATIVE 01/12/2022 1747   HGBUR NEGATIVE 01/12/2022 1747   BILIRUBINUR NEGATIVE 01/12/2022 1747   KETONESUR 5 (A) 01/12/2022 1747   PROTEINUR NEGATIVE 01/12/2022 1747   NITRITE NEGATIVE 01/12/2022 1747   LEUKOCYTESUR NEGATIVE 01/12/2022 1747    Radiological Exams on Admission: CT ABDOMEN PELVIS W CONTRAST  Result Date: 06/03/2023 CLINICAL DATA:  Acute abdominal pain. EXAM: CT ABDOMEN AND PELVIS WITH CONTRAST TECHNIQUE: Multidetector CT imaging of the abdomen and pelvis was performed using the standard protocol following bolus administration of intravenous contrast. RADIATION DOSE REDUCTION: This exam was performed according to the departmental dose-optimization program which  includes automated exposure control, adjustment of the mA and/or kV according to patient size and/or use of iterative reconstruction technique. CONTRAST:  OMNIPAQUE IOHEXOL 350 MG/ML SOLN COMPARISON:  CT 02/11/2023 FINDINGS: Lower chest: Assessed on concurrent chest CT, reported separately. Hepatobiliary: Advanced hepatic steatosis. More focal fatty infiltration adjacent to the falciform ligament. The liver is enlarged spanning 19.6 cm cranial caudal. Clips in the gallbladder fossa postcholecystectomy. No biliary dilatation. Pancreas: Unremarkable. No pancreatic ductal dilatation or surrounding inflammatory changes. Spleen: Normal in size without focal abnormality. Adrenals/Urinary Tract: No adrenal nodule. No hydronephrosis, renal inflammation or focal renal abnormality. Moderately distended bladder, normal for degree of distension. 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 The excluded gastric remnant is minimally distended with fluid. The Roux limb is nondilated. The jejunal anastomosis is unremarkable. There is no bowel obstruction or inflammatory change. Small volume of stool in the colon. Normal appendix visualized.  Vascular/Lymphatic: No acute vascular findings. Normal caliber abdominal aorta. Patent portal vein. No abdominopelvic adenopathy. Reproductive: Uterus and bilateral adnexa are unremarkable. Other: No free air, free fluid, or intra-abdominal fluid collection. Musculoskeletal: There are no acute or suspicious osseous abnormalities. Surgical screw traverses the left intertrochanteric femur. Chronic changes of the bilateral hips. IMPRESSION: 1. No acute abnormality in the abdomen/pelvis. 2. Hepatomegaly and advanced hepatic steatosis. 3. Bariatric surgery with gastric suture line crossing the diaphragmatic hiatus and small hiatal hernia. Electronically Signed   By: Narda Rutherford M.D.   On: 06/03/2023 22:52   CT Angio  Chest PE W and/or Wo Contrast  Result Date: 06/03/2023 CLINICAL DATA:  Pulmonary embolism (PE) suspected, high prob Right-sided pain. EXAM: CT ANGIOGRAPHY CHEST WITH CONTRAST TECHNIQUE: Multidetector CT imaging of the chest was performed using the standard protocol during bolus administration of intravenous contrast. Multiplanar CT image reconstructions and MIPs were obtained to evaluate the vascular anatomy. RADIATION DOSE REDUCTION: This exam was performed according to the departmental dose-optimization program which includes automated exposure control, adjustment of the mA and/or kV according to patient size and/or use of iterative reconstruction technique. CONTRAST:  OMNIPAQUE IOHEXOL 350 MG/ML SOLN COMPARISON:  Chest radiograph earlier today.  Chest CT 10/16/2020 FINDINGS: Cardiovascular: There are no filling defects within the pulmonary arteries to suggest pulmonary embolus. Evaluation is diagnostic to the segmental level. The subsegmental branches are not well assessed due to phase of contrast. Mild dilatation of the main pulmonary artery at 3 cm. Thoracic aorta is normal in caliber. Common origin of the brachiocephalic and left common carotid artery, variant anatomy. The heart is upper normal in size. No pericardial effusion. Mediastinum/Nodes: Prominent right hilar node at 10 mm. No mediastinal adenopathy. There is a small hiatal hernia. Bariatric surgery with gastric suture line crossing the diaphragmatic hiatus. Lungs/Pleura: Elongated cavitary process in the periphery of the right upper and right lower lobe. Above the major fissure there is a peripheral consolidative opacity with surrounding ground-glass and central focus of air. This extends inferiorly in the subpleural space into the right lower lobe where there is a sub pulmonic air-fluid collection measuring 4.5 x 1.9 x 7 cm, series 12, image 69 there is also a small dependent right pleural effusion. The left lung is clear. There are no  features of pulmonary edema. Trachea and central airways are clear. Upper Abdomen: Assessed on concurrent abdominopelvic CT, reported separately. Musculoskeletal: There are no acute or suspicious osseous abnormalities. No rib fracture, destructive or erosive changes, particularly of right ribs. Review of the MIP images confirms the above findings. IMPRESSION: 1. No pulmonary embolus. 2. Elongated cavitary process in the periphery of the right upper and right lower lobe. Right upper lobe peripheral consolidative opacity with surrounding ground-glass and central focus of air. This extends inferiorly in the subpleural space into the right lower lobe where there is a sub pulmonic air-fluid collection measuring 4.5 x 1.9 x 7 cm. There is also a small dependent right pleural effusion. Findings are most consistent with necrotizing pneumonia. Recommend follow-up imaging to resolution. 3. Prominent right hilar node at 10 mm, likely reactive. 4. Small hiatal hernia. Bariatric surgery with gastric suture line crossing the diaphragmatic hiatus. 5. Mild dilatation of the main pulmonary artery, can be seen with pulmonary arterial hypertension. Electronically Signed   By: Narda Rutherford M.D.   On: 06/03/2023 22:49   DG Chest 2 View  Result Date: 06/03/2023 CLINICAL DATA:  Chest pain. EXAM: CHEST - 2 VIEW COMPARISON:  02/11/2023.  FINDINGS: Bilateral lung fields are clear. There is trace right pleural effusion, new since the prior study. Left lateral costophrenic angle is clear. No pneumothorax on either side. Normal cardio-mediastinal silhouette. No acute osseous abnormalities. The soft tissues are within normal limits. IMPRESSION: *New trace right pleural effusion. Electronically Signed   By: Jules Schick M.D.   On: 06/03/2023 15:12    EKG: Independently reviewed.   Assessment and Plan: * Necrotizing pneumonia (HCC) Necrotizing PNA / pulmonary abscess.  Most likely I suspect aspiration / microaspiration in setting  of recent increased seizures due to ongoing EtOH use (as she reported to her psychiatrist just a couple of days ago). DDx includes TB, but seems very inferior in lung for this, neoplasm also possible but seems less likely. Empiric Unasyn MRSA PCR is negative Quantiferon pending Airborne precautions until we get quantiferon back, just-in-case PNA pathway Repeat CBC in AM HIV pending Will defer to day team if they feel full pulm consult is needed  Seizure disorder (HCC) More frequent seizures lately it sounds like. Sounds like providers suspect the EtOH to be related.  Pt feels these are stress related. Continue Keppra Tele monitor  Alcohol use Sounds like her psychiatrist thinks her drinking is more of a problem than she thinks it is. CIWA for withdrawal prevention Tele monitor      Advance Care Planning:   Code Status: Full Code  Consults: None formally, will send a quick message to Dr. Vassie Loll to make sure he doesn't have any other recommendations.  Family Communication: No family in room  Severity of Illness: The appropriate patient status for this patient is OBSERVATION. Observation status is judged to be reasonable and necessary in order to provide the required intensity of service to ensure the patient's safety. The patient's presenting symptoms, physical exam findings, and initial radiographic and laboratory data in the context of their medical condition is felt to place them at decreased risk for further clinical deterioration. Furthermore, it is anticipated that the patient will be medically stable for discharge from the hospital within 2 midnights of admission.   Author: Hillary Bow., DO 06/04/2023 3:51 AM  For on call review [email or web address].

## 2023-06-04 NOTE — Progress Notes (Signed)
Pharmacy Antibiotic Note  Marie Myers is a 32 y.o. female admitted on 06/03/2023 with aspiration pneumonia.  Pharmacy has been consulted for Unasyn dosing.  Plan: Unasyn 3gm IV q6h No dose adjustments anticipated.  Pharmacy will sign off and monitor peripherally via electronic surveillance software for any changes in renal function or micro data.   Height: 5\' 6"  (167.6 cm) Weight: 80.7 kg (178 lb) IBW/kg (Calculated) : 59.3  Temp (24hrs), Avg:98.6 F (37 C), Min:98 F (36.7 C), Max:99.2 F (37.3 C)  Recent Labs  Lab 06/03/23 1433 06/03/23 2335  WBC 14.9*  --   CREATININE 0.48  --   LATICACIDVEN  --  1.7    Estimated Creatinine Clearance: 109.2 mL/min (by C-G formula based on SCr of 0.48 mg/dL).    Allergies  Allergen Reactions   Zithromax [Azithromycin] Hives    Thank you for allowing pharmacy to be a part of this patient's care.  Junita Push PharmD 06/04/2023 1:55 AM

## 2023-06-04 NOTE — Progress Notes (Incomplete)
PHARMACY - PHYSICIAN COMMUNICATION CRITICAL VALUE ALERT - BLOOD CULTURE IDENTIFICATION (BCID)  Marie Myers is an 31 y.o. female who presented to [facility] on 06/03/2023 with a chief complaint of chest pain.  Assessment:  Admit with necrotizing PNA.   Anaerobic bottle of 1 blood cx set + GPCC; BCID + CoNS. Possible contaminant vs. True pathogen? She is afebrile & hemodynamically stable.   Name of physician (or Provider) Contacted: [place]  Current antibiotics: Unasyn  Changes to prescribed antibiotics recommended:  Continue current antibiotics.  F/U final cx data, clinical course.   Results for orders placed or performed during the hospital encounter of 06/03/23  Blood Culture ID Panel (Reflexed) (Collected: 06/03/2023 11:53 PM)  Result Value Ref Range   Enterococcus faecalis NOT DETECTED NOT DETECTED   Enterococcus Faecium NOT DETECTED NOT DETECTED   Listeria monocytogenes NOT DETECTED NOT DETECTED   Staphylococcus species DETECTED (A) NOT DETECTED   Staphylococcus aureus (BCID) NOT DETECTED NOT DETECTED   Staphylococcus epidermidis NOT DETECTED NOT DETECTED   Staphylococcus lugdunensis NOT DETECTED NOT DETECTED   Streptococcus species NOT DETECTED NOT DETECTED   Streptococcus agalactiae NOT DETECTED NOT DETECTED   Streptococcus pneumoniae NOT DETECTED NOT DETECTED   Streptococcus pyogenes NOT DETECTED NOT DETECTED   A.calcoaceticus-baumannii NOT DETECTED NOT DETECTED   Bacteroides fragilis NOT DETECTED NOT DETECTED   Enterobacterales NOT DETECTED NOT DETECTED   Enterobacter cloacae complex NOT DETECTED NOT DETECTED   Escherichia coli NOT DETECTED NOT DETECTED   Klebsiella aerogenes NOT DETECTED NOT DETECTED   Klebsiella oxytoca NOT DETECTED NOT DETECTED   Klebsiella pneumoniae NOT DETECTED NOT DETECTED   Proteus species NOT DETECTED NOT DETECTED   Salmonella species NOT DETECTED NOT DETECTED   Serratia marcescens NOT DETECTED NOT DETECTED   Haemophilus influenzae NOT  DETECTED NOT DETECTED   Neisseria meningitidis NOT DETECTED NOT DETECTED   Pseudomonas aeruginosa NOT DETECTED NOT DETECTED   Stenotrophomonas maltophilia NOT DETECTED NOT DETECTED   Candida albicans NOT DETECTED NOT DETECTED   Candida auris NOT DETECTED NOT DETECTED   Candida glabrata NOT DETECTED NOT DETECTED   Candida krusei NOT DETECTED NOT DETECTED   Candida parapsilosis NOT DETECTED NOT DETECTED   Candida tropicalis NOT DETECTED NOT DETECTED   Cryptococcus neoformans/gattii NOT DETECTED NOT DETECTED    Junita Push PharmD 06/04/2023  10:36 PM

## 2023-06-04 NOTE — ED Notes (Signed)
RN went to lab to get kit for QuantiFERON-TB GOLD Plus, per lab kit is not available at this time, MD Gokul made aware.

## 2023-06-04 NOTE — Assessment & Plan Note (Addendum)
More frequent seizures lately it sounds like. Sounds like providers suspect the EtOH to be related.  Pt feels these are stress related. Continue Keppra Tele monitor

## 2023-06-04 NOTE — Progress Notes (Signed)
Patient admitted earlier this morning.  H&P reviewed.  Patient seen and examined.  Patient complains of right-sided chest discomfort.  Complains of cough.  Also complains of upper abdominal discomfort with episodes of nausea and vomiting ongoing for several weeks.  Also mention that she had a seizure about 2 weeks ago.  Drinks about 3 beers on a daily basis.  Vital signs reviewed.  Saturating in the 90s on room air. She was quite tachycardic initially but heart rate appears to have improved.  Crackles heard right lung.  No wheezing. S1-S2 is tachycardic regular Abdomen is soft.  Tender in the epigastric area without any rebound rigidity or guarding.  No masses organomegaly.  WBC 11.1 this morning.  Potassium is 3.4.  Lactic acid level was normal. QuantiFERON testing is pending.  COVID-19 test was negative.  Necrotizing pneumonia/pulmonary abscess is being treated with Unasyn.  QuantiFERON testing is pending.  From a respiratory standpoint she appears to be stable.  Sputum cultures will be ordered. May benefit from a pulmonology input, consulted.  HIV is pending.  Nausea and vomiting/abdominal pain in the setting of bariatric surgery previously.  Surgery was done somewhere in [place] about 10 years ago.  CT scan did not show any acute findings but the radiologist did comment that the bariatric surgery suture line was crossing the diaphragmatic hiatus and she was noted to have a small hiatal hernia.  Will place her on PPI.  Will discuss these findings with general surgery.  General surgery to look at her imaging studies and give their opinion.  Continue Keppra for seizure disorder.  Counseled for her alcohol use.  On CIWA protocol.  Other issues as per H&P.  Osvaldo Shipper 06/04/2023

## 2023-06-04 NOTE — Consult Note (Signed)
Marie Myers [birth year]  [id number].    Requesting MD: Dr. Osvaldo Shipper Chief Complaint/Reason for Consult: Hx of gastric bypass, HH  HPI: Marie Myers is a 32 y.o. female who presented to the ED with chest pain, abdominal pain, nausea and vomiting.   Patient reports history of gastric bypass in [place] approximately 10 years ago.  She reports postoperatively she required several endoscopies and what sounds like dilation.  She denies known history of marginal ulcers.  She moved down to [place] 5 years ago and has not established care with a bariatric surgeon down here.  She does vape.  Reports she drinks 2 glasses of wine every night except for over the last 3 nights.  No NSAID use.  No recent steroid use.  She does not take a multivitamin or PPI. Last endoscopy ~7 years ago.   She reports over the last 1 week she has been having right sided abdominal pain, chest pain as well as nausea and vomiting.  She reports nausea and vomiting is brought on when she does not eat for an extended period of time. She denies worsening abdominal pain, n/v after po intake. Passing flatus. Last BM 3 days ago. Hx of Cholecystectomy. Lipase wnl.   In the ED she was afebrile, tachycardic, without hypotension.  WBC 11.1.  Lactic 1.5.  CT scan showed necrotizing pneumonia.  This also showed a small hiatal hernia with gastric suture line crossing the diaphragmatic hiatus.  She was admitted to [facility]  Pulm was consulted.  She is on Unasyn.   Past Medical History: Alcohol use, anxiety, depression.  Prior Abdominal Surgeries: Gastric Bypass, Cholecystectomy  Blood Thinners: None  ROS: ROS As above, see hpi  Family History  Problem Relation Age of Onset   Diabetes Mother    Diabetes Father     Past Medical History:  Diagnosis Date   Anxiety    Depression    H/O gastric bypass    Insomnia    Marijuana use 01/08/2021   Moderate alcohol use disorder (HCC) 01/08/2021    Past Surgical History:   Procedure Laterality Date   CHOLECYSTECTOMY     GASTRIC BYPASS     GASTRIC BYPASS     LAPAROSCOPY N/A 08/11/2019   Procedure: LAPAROSCOPY DIAGNOSTIC;  Surgeon: Harriette Bouillon, MD;  Location: [facility] OR;  Service: General;  Laterality: N/A;    Social History:  reports that she has never smoked. She has never used smokeless tobacco. She reports current alcohol use of about 1.0 standard drink of alcohol per week. She reports current drug use. Drug: Marijuana.  Allergies:  Allergies  Allergen Reactions   Zithromax [Azithromycin] Hives    (Not in a hospital admission)    Physical Exam: Blood pressure 136/83, pulse 87, temperature 98.6 F (37 C), temperature source Oral, resp. rate 16, height 5\' 6"  (1.676 m), weight 80.7 kg, last menstrual period 05/11/2023, SpO2 96%. General: pleasant, WD/WN female who is laying in bed in NAD HEENT: head is normocephalic, atraumatic.   Heart: regular Lungs:Respiratory effort nonlabored Abd:  Soft, ND, central abdominal ttp without rigidity or guarding. +BS. No masses, hernias, or organomegaly Skin: warm and dry  Psych: A&Ox4 with an appropriate affect   Results for orders placed or performed during the hospital encounter of 06/03/23 (from the past 48 hour(s))  Basic metabolic panel     Status: Abnormal   Collection Time: 06/03/23  2:33 PM  Result Value Ref Range   Sodium 135 135 - 145 mmol/L  Potassium 3.6 3.5 - 5.1 mmol/L   Chloride 95 (L) 98 - 111 mmol/L   CO2 22 22 - 32 mmol/L   Glucose, Bld 95 70 - 99 mg/dL    Comment: Glucose reference range applies only to samples taken after fasting for at least 8 hours.   BUN <5 (L) 6 - 20 mg/dL   Creatinine, Ser 1.32 0.44 - 1.00 mg/dL   Calcium 8.6 (L) 8.9 - 10.3 mg/dL   GFR, Estimated >44 >01 mL/min    Comment: (NOTE) Calculated using the CKD-EPI Creatinine Equation (2021)    Anion gap 18 (H) 5 - 15    Comment: Performed at [facility], [address] [place], [place]  [address]  CBC     Status: Abnormal   Collection Time: 06/03/23  2:33 PM  Result Value Ref Range   WBC 14.9 (H) 4.0 - 10.5 K/uL   RBC 3.62 (L) 3.87 - 5.11 MIL/uL   Hemoglobin 13.1 12.0 - 15.0 g/dL   HCT 36.6 44.0 - 34.7 %   MCV 104.1 (H) 80.0 - 100.0 fL   MCH 36.2 (H) 26.0 - 34.0 pg   MCHC 34.7 30.0 - 36.0 g/dL   RDW 42.5 (H) 95.6 - 38.7 %   Platelets 198 150 - 400 K/uL   nRBC 0.0 0.0 - 0.2 %    Comment: Performed at [facility], [address], [place], [place] [address]  Troponin I (High Sensitivity)     Status: None   Collection Time: 06/03/23  2:33 PM  Result Value Ref Range   Troponin I (High Sensitivity) <2 <18 ng/L    Comment: (NOTE) Elevated high sensitivity troponin I (hsTnI) values and significant  changes across serial measurements may suggest ACS but many other  chronic and acute conditions are known to elevate hsTnI results.  Refer to the "Links" section for chest pain algorithms and additional  guidance. Performed at [facility], [address], [place], [place] [address]   hCG, serum, qualitative     Status: None   Collection Time: 06/03/23  2:33 PM  Result Value Ref Range   Preg, Serum NEGATIVE NEGATIVE    Comment:        THE SENSITIVITY OF THIS METHODOLOGY IS >10 mIU/mL. Performed at [facility], [address], [place], [place] [address]   Hepatic function panel     Status: Abnormal   Collection Time: 06/03/23  2:33 PM  Result Value Ref Range   Total Protein 7.3 6.5 - 8.1 g/dL   Albumin 3.9 3.5 - 5.0 g/dL   AST 063 (H) 15 - 41 U/L   ALT 40 0 - 44 U/L   Alkaline Phosphatase 102 38 - 126 U/L   Total Bilirubin 0.7 0.3 - 1.2 mg/dL   Bilirubin, Direct 0.2 0.0 - 0.2 mg/dL   Indirect Bilirubin 0.5 0.3 - 0.9 mg/dL    Comment: Performed at [facility], [address] [place], [place] [address]  Lipase, blood     Status: None   Collection Time: 06/03/23  2:33 PM  Result Value Ref  Range   Lipase 21 11 - 51 U/L    Comment: Performed at [facility], [address] [place], [place] [address]  Resp panel by RT-PCR (RSV, Flu A&B, Covid) Anterior Nasal Swab     Status: None   Collection Time: 06/03/23 11:35 PM   Specimen: Anterior Nasal Swab  Result Value Ref  Range   SARS Coronavirus 2 by RT PCR NEGATIVE NEGATIVE    Comment: (NOTE) SARS-CoV-2 target nucleic acids are NOT DETECTED.  The SARS-CoV-2 RNA is generally detectable in upper respiratory specimens during the acute phase of infection. The lowest concentration of SARS-CoV-2 viral copies this assay can detect is 138 copies/mL. A negative result does not preclude SARS-Cov-2 infection and should not be used as the sole basis for treatment or other patient management decisions. A negative result may occur with  improper specimen collection/handling, submission of specimen other than nasopharyngeal swab, presence of viral mutation(s) within the areas targeted by this assay, and inadequate number of viral copies(<138 copies/mL). A negative result must be combined with clinical observations, patient history, and epidemiological information. The expected result is Negative.  Fact Sheet for Patients:  BloggerCourse.com  Fact Sheet for Healthcare Providers:  SeriousBroker.it  This test is no t yet approved or cleared by the [place] FDA and  has been authorized for detection and/or diagnosis of SARS-CoV-2 by FDA under an Emergency Use Authorization (EUA). This EUA will remain  in effect (meaning this test can be used) for the duration of the COVID-19 declaration under Section 564(b)(1) of the Act, 21 U.S.C.section 360bbb-3(b)(1), unless the authorization is terminated  or revoked sooner.       Influenza A by PCR NEGATIVE NEGATIVE   Influenza B by PCR NEGATIVE NEGATIVE    Comment: (NOTE) The Xpert Xpress SARS-CoV-2/FLU/RSV plus assay is  intended as an aid in the diagnosis of influenza from Nasopharyngeal swab specimens and should not be used as a sole basis for treatment. Nasal washings and aspirates are unacceptable for Xpert Xpress SARS-CoV-2/FLU/RSV testing.  Fact Sheet for Patients: BloggerCourse.com  Fact Sheet for Healthcare Providers: SeriousBroker.it  This test is not yet approved or cleared by the [place] FDA and has been authorized for detection and/or diagnosis of SARS-CoV-2 by FDA under an Emergency Use Authorization (EUA). This EUA will remain in effect (meaning this test can be used) for the duration of the COVID-19 declaration under Section 564(b)(1) of the Act, 21 U.S.C. section 360bbb-3(b)(1), unless the authorization is terminated or revoked.     Resp Syncytial Virus by PCR NEGATIVE NEGATIVE    Comment: (NOTE) Fact Sheet for Patients: BloggerCourse.com  Fact Sheet for Healthcare Providers: SeriousBroker.it  This test is not yet approved or cleared by the [place] FDA and has been authorized for detection and/or diagnosis of SARS-CoV-2 by FDA under an Emergency Use Authorization (EUA). This EUA will remain in effect (meaning this test can be used) for the duration of the COVID-19 declaration under Section 564(b)(1) of the Act, 21 U.S.C. section 360bbb-3(b)(1), unless the authorization is terminated or revoked.  Performed at [facility], [address] [place], [place] [address]   Lactic acid, plasma     Status: None   Collection Time: 06/03/23 11:35 PM  Result Value Ref Range   Lactic Acid, Venous 1.7 0.5 - 1.9 mmol/L    Comment: Performed at [facility], [address] [place], [place] [address]  Lactic acid, plasma     Status: None   Collection Time: 06/04/23  2:11 AM  Result Value Ref Range   Lactic Acid, Venous 1.5 0.5 - 1.9 mmol/L     Comment: Performed at [facility], [address] [place], [place] [address]  MRSA Next Gen by PCR, Nasal     Status: None   Collection Time: 06/04/23  2:12 AM  Specimen: Nasal Mucosa; Nasal Swab  Result Value Ref Range   MRSA by PCR Next Gen NOT DETECTED NOT DETECTED    Comment: (NOTE) The GeneXpert MRSA Assay (FDA approved for NASAL specimens only), is one component of a comprehensive MRSA colonization surveillance program. It is not intended to diagnose MRSA infection nor to guide or monitor treatment for MRSA infections. Test performance is not FDA approved in patients less than 58 years old. Performed at [facility], [address] [place], [place] [address]   Basic metabolic panel     Status: Abnormal   Collection Time: 06/04/23  8:34 AM  Result Value Ref Range   Sodium 131 (L) 135 - 145 mmol/L   Potassium 3.4 (L) 3.5 - 5.1 mmol/L   Chloride 94 (L) 98 - 111 mmol/L   CO2 24 22 - 32 mmol/L   Glucose, Bld 79 70 - 99 mg/dL    Comment: Glucose reference range applies only to samples taken after fasting for at least 8 hours.   BUN <5 (L) 6 - 20 mg/dL   Creatinine, Ser 6.04 0.44 - 1.00 mg/dL   Calcium 8.3 (L) 8.9 - 10.3 mg/dL   GFR, Estimated >54 >09 mL/min    Comment: (NOTE) Calculated using the CKD-EPI Creatinine Equation (2021)    Anion gap 13 5 - 15    Comment: Performed at [facility], [address] [place], [place] [address]  CBC with Differential/Platelet     Status: Abnormal   Collection Time: 06/04/23  8:34 AM  Result Value Ref Range   WBC 11.1 (H) 4.0 - 10.5 K/uL   RBC 2.85 (L) 3.87 - 5.11 MIL/uL   Hemoglobin 10.5 (L) 12.0 - 15.0 g/dL   HCT 47.8 (L) 29.5 - 62.1 %   MCV 106.3 (H) 80.0 - 100.0 fL   MCH 36.8 (H) 26.0 - 34.0 pg   MCHC 34.7 30.0 - 36.0 g/dL   RDW 30.8 (H) 65.7 - 84.6 %   Platelets 155 150 - 400 K/uL   nRBC 0.0 0.0 - 0.2 %   Neutrophils Relative % 82 %   Neutro Abs 9.1 (H) 1.7 - 7.7  K/uL   Lymphocytes Relative 10 %   Lymphs Abs 1.1 0.7 - 4.0 K/uL   Monocytes Relative 7 %   Monocytes Absolute 0.8 0.1 - 1.0 K/uL   Eosinophils Relative 0 %   Eosinophils Absolute 0.0 0.0 - 0.5 K/uL   Basophils Relative 0 %   Basophils Absolute 0.0 0.0 - 0.1 K/uL   Immature Granulocytes 1 %   Abs Immature Granulocytes 0.08 (H) 0.00 - 0.07 K/uL    Comment: Performed at [facility], [address] [place], [place] [address]  HIV Antibody (routine testing w rflx)     Status: None   Collection Time: 06/04/23  8:34 AM  Result Value Ref Range   HIV Screen 4th Generation wRfx Non Reactive Non Reactive    Comment: Performed at [facility], [address], [place], [place] [address]   CT ABDOMEN PELVIS W CONTRAST  Result Date: 06/03/2023 CLINICAL DATA:  Acute abdominal pain. EXAM: CT ABDOMEN AND PELVIS WITH CONTRAST TECHNIQUE: Multidetector CT imaging of the abdomen and pelvis was performed using the standard protocol following bolus administration of intravenous contrast. RADIATION DOSE REDUCTION: This exam was performed according to the departmental dose-optimization program which includes automated exposure control, adjustment of the mA and/or kV according to patient size and/or use of  iterative reconstruction technique. CONTRAST:  OMNIPAQUE IOHEXOL 350 MG/ML SOLN COMPARISON:  CT 02/11/2023 FINDINGS: Lower chest: Assessed on concurrent chest CT, reported separately. Hepatobiliary: Advanced hepatic steatosis. More focal fatty infiltration adjacent to the falciform ligament. The liver is enlarged spanning 19.6 cm cranial caudal. Clips in the gallbladder fossa postcholecystectomy. No biliary dilatation. Pancreas: Unremarkable. No pancreatic ductal dilatation or surrounding inflammatory changes. Spleen: Normal in size without focal abnormality. Adrenals/Urinary Tract: No adrenal nodule. No hydronephrosis, renal inflammation or focal renal abnormality. Moderately distended  bladder, normal for degree of distension. 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 The excluded gastric remnant is minimally distended with fluid. The Roux limb is nondilated. The jejunal anastomosis is unremarkable. There is no bowel obstruction or inflammatory change. Small volume of stool in the colon. Normal appendix visualized. Vascular/Lymphatic: No acute vascular findings. Normal caliber abdominal aorta. Patent portal vein. No abdominopelvic adenopathy. Reproductive: Uterus and bilateral adnexa are unremarkable. Other: No free air, free fluid, or intra-abdominal fluid collection. Musculoskeletal: There are no acute or suspicious osseous abnormalities. Surgical screw traverses the left intertrochanteric femur. Chronic changes of the bilateral hips. IMPRESSION: 1. No acute abnormality in the abdomen/pelvis. 2. Hepatomegaly and advanced hepatic steatosis. 3. Bariatric surgery with gastric suture line crossing the diaphragmatic hiatus and small hiatal hernia. Electronically Signed   By: Narda Rutherford M.D.   On: 06/03/2023 22:52   CT Angio Chest PE W and/or Wo Contrast  Result Date: 06/03/2023 CLINICAL DATA:  Pulmonary embolism (PE) suspected, high prob Right-sided pain. EXAM: CT ANGIOGRAPHY CHEST WITH CONTRAST TECHNIQUE: Multidetector CT imaging of the chest was performed using the standard protocol during bolus administration of intravenous contrast. Multiplanar CT image reconstructions and MIPs were obtained to evaluate the vascular anatomy. RADIATION DOSE REDUCTION: This exam was performed according to the departmental dose-optimization program which includes automated exposure control, adjustment of the mA and/or kV according to patient size and/or use of iterative reconstruction technique. CONTRAST:  OMNIPAQUE IOHEXOL 350 MG/ML SOLN COMPARISON:  Chest radiograph earlier today.  Chest CT 10/16/2020 FINDINGS:  Cardiovascular: There are no filling defects within the pulmonary arteries to suggest pulmonary embolus. Evaluation is diagnostic to the segmental level. The subsegmental branches are not well assessed due to phase of contrast. Mild dilatation of the main pulmonary artery at 3 cm. Thoracic aorta is normal in caliber. Common origin of the brachiocephalic and left common carotid artery, variant anatomy. The heart is upper normal in size. No pericardial effusion. Mediastinum/Nodes: Prominent right hilar node at 10 mm. No mediastinal adenopathy. There is a small hiatal hernia. Bariatric surgery with gastric suture line crossing the diaphragmatic hiatus. Lungs/Pleura: Elongated cavitary process in the periphery of the right upper and right lower lobe. Above the major fissure there is a peripheral consolidative opacity with surrounding ground-glass and central focus of air. This extends inferiorly in the subpleural space into the right lower lobe where there is a sub pulmonic air-fluid collection measuring 4.5 x 1.9 x 7 cm, series 12, image 69 there is also a small dependent right pleural effusion. The left lung is clear. There are no features of pulmonary edema. Trachea and central airways are clear. Upper Abdomen: Assessed on concurrent abdominopelvic CT, reported separately. Musculoskeletal: There are no acute or suspicious osseous abnormalities. No rib fracture, destructive or erosive changes, particularly of right ribs. Review of the MIP images confirms the above findings. IMPRESSION: 1. No pulmonary embolus. 2. Elongated cavitary process in the periphery  of the right upper and right lower lobe. Right upper lobe peripheral consolidative opacity with surrounding ground-glass and central focus of air. This extends inferiorly in the subpleural space into the right lower lobe where there is a sub pulmonic air-fluid collection measuring 4.5 x 1.9 x 7 cm. There is also a small dependent right pleural effusion. Findings are  most consistent with necrotizing pneumonia. Recommend follow-up imaging to resolution. 3. Prominent right hilar node at 10 mm, likely reactive. 4. Small hiatal hernia. Bariatric surgery with gastric suture line crossing the diaphragmatic hiatus. 5. Mild dilatation of the main pulmonary artery, can be seen with pulmonary arterial hypertension. Electronically Signed   By: Narda Rutherford M.D.   On: 06/03/2023 22:49   DG Chest 2 View  Result Date: 06/03/2023 CLINICAL DATA:  Chest pain. EXAM: CHEST - 2 VIEW COMPARISON:  02/11/2023. FINDINGS: Bilateral lung fields are clear. There is trace right pleural effusion, new since the prior study. Left lateral costophrenic angle is clear. No pneumothorax on either side. Normal cardio-mediastinal silhouette. No acute osseous abnormalities. The soft tissues are within normal limits. IMPRESSION: *New trace right pleural effusion. Electronically Signed   By: Jules Schick M.D.   On: 06/03/2023 15:12    Anti-infectives (From admission, onward)    Start     Dose/Rate Route Frequency Ordered Stop   06/04/23 0600  Ampicillin-Sulbactam (UNASYN) 3 g in sodium chloride 0.9 % 100 mL IVPB        3 g 200 mL/hr over 30 Minutes Intravenous Every 6 hours 06/04/23 0155     06/03/23 2330  piperacillin-tazobactam (ZOSYN) IVPB 3.375 g        3.375 g 100 mL/hr over 30 Minutes Intravenous  Once 06/03/23 2319 06/04/23 0036   06/03/23 2330  vancomycin (VANCOREADY) IVPB 1750 mg/350 mL        1,750 mg 175 mL/hr over 120 Minutes Intravenous  Once 06/03/23 2319 06/04/23 0346       Assessment/Plan Jazmyn Wible is a 32 y.o. female with history of gastric bypass in [place] approximately 10 years ago who presented with 1 week history of right-sided abdominal/chest pain, nausea and vomiting.  She was admitted with necrotizing pneumonia. CT also showed small hiatal hernia with gastric suture line crossing the diaphragmatic hiatus.   Patient is currently hemodynamically stable without  fever, tachycardia or hypotension.  Lactic x 2 WNL.  WBC 11.1.  No peritonitis on exam.  No indication for emergency surgery.  Would recommend UGI to rule out perforation. Please keep strict n.p.o. until this results.  If UGI is negative she would benefit from establishing with a bariatric surgeon here in [facility] for follow-up and discuss elective repair of her hiatal hernia. Would also recommend patient start Multi when able to take PO again and continue at d/c.  May also benefit from PPI given alcohol and vape use.  Recommend alcohol and vaping cessation. Avoid NSAIDs.   We will follow with you.   I reviewed nursing notes, last 24 h vitals and pain scores, last 48 h intake and output, last 24 h labs and trends, and last 24 h imaging results.  Marie Myers, [facility] 06/04/2023, 1:06 PM Please see Amion for pager number during day hours 7:00am-4:30pm

## 2023-06-04 NOTE — ED Notes (Addendum)
Pt received lunch tray. Pt has no other needs at this time. Call light within reach. Bed locked and in lowest position.

## 2023-06-04 NOTE — ED Notes (Signed)
Pt NPO per Laurette Schimke

## 2023-06-04 NOTE — Telephone Encounter (Signed)
CT order placed

## 2023-06-04 NOTE — Assessment & Plan Note (Addendum)
Sounds like her psychiatrist thinks her drinking is more of a problem than she thinks it is. CIWA for withdrawal prevention Tele monitor

## 2023-06-04 NOTE — Telephone Encounter (Signed)
We will need an order put in for the CT

## 2023-06-04 NOTE — Consult Note (Addendum)
NAME:  Glynnis Gavel, MRN:  [id number], DOB:  1990/11/09, LOS: 0 ADMISSION DATE:  06/03/2023, CONSULTATION DATE:  10.11 REFERRING MD:  Rito Ehrlich, CHIEF COMPLAINT:  necrotizing/cavitary pna   History of Present Illness:  32 year old female w/ h/o prior gastric bypass, seizure disorder (last seizure about 2 weeks prior to presentation), presented to ER 10/11 w/ ~ 1 week  h/o worsening shortness of breath and right sided pleuritic CP after few days of NP cough. Has chronic nausea and intermittent vomiting she attributes to her bypass surg.  In ER wbc ct 14.9, trop I neg, elevated AST 114, lipase nml at 21, RVP neg, lactate nml. Initial cxr w/ trace right pleural effusion.  CT chest showed consolidation w/ central clearing and air/fluid level that extends from RUL to RLL sub pleural space.  -as noted drinks primarily for self medication when her trazodone runs out. Drinks most days but does not experience w/d if misses. Last ETOH 3d ago. Does have tooth posterior R lower jaw that she has had problems with in past but not currently. She was started on IV abx. PCCM asked to see for necrotizing PNA   Pertinent  Medical History  Seizure disorder on keppra  Daily alcohol consumption  Bariatric surgery (10 yrs ago) Small hiatal hernia  Depression  Significant Hospital Events: Including procedures, antibiotic start and stop dates in addition to other pertinent events   10/11 admitted 1 week h/o right lower CP, pleuritic in nature. Increased associated SOB. H/o seizures. Last 2 weeks prior to admit. CT chest 1. No pulmonary embolus.2. Elongated cavitary process in the periphery of the right upper and right lower lobe. Right upper lobe peripheral consolidative opacity with surrounding ground-glass and central focus of air. This  extends inferiorly in the subpleural space into the right lower lobe where there is a sub pulmonic air-fluid collection measuring 4.5 x1.9 x 7 cm. There is also a small dependent right  pleural effusion. Findings are most consistent with necrotizing pneumonia. 3. Prominent right hilar node at 10 mm, likely reactive.4. Small hiatal hernia. Bariatric surgery with gastric suture line crossing the diaphragmatic hiatus. 5. Mild dilatation of the main pulmonary artery, can be seen with pulmonary arterial hypertension. Unasyn started. HIV sent   Interim History / Subjective:   Still has sig right sided pleuritic pain   Objective   Blood pressure 115/67, pulse 100, temperature 99 F (37.2 C), temperature source Oral, resp. rate 16, height 5\' 6"  (1.676 m), weight 80.7 kg, last menstrual period 05/11/2023, SpO2 98%.        Intake/Output Summary (Last 24 hours) at 06/04/2023 1032 Last data filed at 06/04/2023 0835 Gross per 24 hour  Intake 100 ml  Output --  Net 100 ml   Filed Weights   06/03/23 1307  Weight: 80.7 kg    Examination: General: 32 year old female laying in bed. No distress but does endorse the chest discomfort  HENT: NCAT no JVD.  Lungs: clear dec bases. CT chest cavitary PNA on right  Cardiovascular: rrr Abdomen: soft  Extremities: warm  Neuro: intact GU: voids   Resolved Hospital Problem list     Assessment & Plan:  Necrotizing PNA Aspiration  H/o Nausea and vomiting  Pleuritic right sided cp 2/2 pna  Seizure disorder ETOH abuse Hiatal hernia Prior gastric bypass   Right sided necrotizing PNA w/ associated right sided pleuritic CP. Suspect 2/2 aspiration. Has multiple risk factors: etoh abuse, hiatal hernia and also prior gastric bypass surg,  Plan Cont IV unasyn Trend CBC F/u pending cultures, HIV Ab and Qf but don't think this is Tb.  Follow clinically w/ plan to transition to augmentin prior to dc. Will need minimally 4-6 weeks abx w/ follow up CT imaging around week 5-6 (prior to stopping abx) to determine if longer course needed. Stop drinking  Would benefit from dental eval -.may need extraction  Certainly if there is something  that can be done about her nausea and intermittent vomiting w/ small hiatal hernia this will also decrease her risk for aspiration in future  Best Practice (right click and "Reselect all SmartList Selections" daily)  Per primary  Labs   CBC: Recent Labs  Lab 06/03/23 1433 06/04/23 0834  WBC 14.9* 11.1*  NEUTROABS  --  9.1*  HGB 13.1 10.5*  HCT 37.7 30.3*  MCV 104.1* 106.3*  PLT 198 155    Basic Metabolic Panel: Recent Labs  Lab 06/03/23 1433 06/04/23 0834  NA 135 131*  K 3.6 3.4*  CL 95* 94*  CO2 22 24  GLUCOSE 95 79  BUN <5* <5*  CREATININE 0.48 0.54  CALCIUM 8.6* 8.3*   GFR: Estimated Creatinine Clearance: 109.2 mL/min (by C-G formula based on SCr of 0.54 mg/dL). Recent Labs  Lab 06/03/23 1433 06/03/23 2335 06/04/23 0211 06/04/23 0834  WBC 14.9*  --   --  11.1*  LATICACIDVEN  --  1.7 1.5  --     Liver Function Tests: Recent Labs  Lab 06/03/23 1433  AST 114*  ALT 40  ALKPHOS 102  BILITOT 0.7  PROT 7.3  ALBUMIN 3.9   Recent Labs  Lab 06/03/23 1433  LIPASE 21   No results for input(s): "AMMONIA" in the last 168 hours.  ABG No results found for: "PHART", "PCO2ART", "PO2ART", "HCO3", "TCO2", "ACIDBASEDEF", "O2SAT"   Coagulation Profile: No results for input(s): "INR", "PROTIME" in the last 168 hours.  Cardiac Enzymes: No results for input(s): "CKTOTAL", "CKMB", "CKMBINDEX", "TROPONINI" in the last 168 hours.  HbA1C: Hgb A1c MFr Bld  Date/Time Value Ref Range Status  01/12/2022 06:23 AM 4.5 (L) 4.8 - 5.6 % Final    Comment:    (NOTE) Pre diabetes:          5.7%-6.4%  Diabetes:              >6.4%  Glycemic control for   <7.0% adults with diabetes   01/08/2021 06:43 PM 4.7 (L) 4.8 - 5.6 % Final    Comment:    (NOTE) Pre diabetes:          5.7%-6.4%  Diabetes:              >6.4%  Glycemic control for   <7.0% adults with diabetes     CBG: No results for input(s): "GLUCAP" in the last 168 hours.  Review of Systems:   Review of  Systems  Constitutional:  Positive for malaise/fatigue. Negative for chills and fever.  HENT: Negative.    Eyes: Negative.   Respiratory:  Positive for cough and shortness of breath.   Cardiovascular:  Positive for chest pain.  Gastrointestinal:  Positive for nausea and vomiting. Negative for abdominal pain.  Genitourinary: Negative.   Musculoskeletal: Negative.   Skin: Negative.   Endo/Heme/Allergies: Negative.      Past Medical History:  She,  has a past medical history of Anxiety, Depression, H/O gastric bypass, Insomnia, Marijuana use (01/08/2021), and Moderate alcohol use disorder (HCC) (01/08/2021).   Surgical History:   Past Surgical  History:  Procedure Laterality Date   CHOLECYSTECTOMY     GASTRIC BYPASS     GASTRIC BYPASS     LAPAROSCOPY N/A 08/11/2019   Procedure: LAPAROSCOPY DIAGNOSTIC;  Surgeon: Harriette Bouillon, MD;  Location: [facility] OR;  Service: General;  Laterality: N/A;     Social History:   reports that she has never smoked. She has never used smokeless tobacco. She reports current alcohol use of about 1.0 standard drink of alcohol per week. She reports current drug use. Drug: Marijuana.   Family History:  Her family history includes Diabetes in her father and mother.   Allergies Allergies  Allergen Reactions   Zithromax [Azithromycin] Hives     Home Medications  Prior to Admission medications   Medication Sig Start Date End Date Taking? Authorizing Provider  levETIRAcetam (KEPPRA) 500 MG tablet Take 1 tablet (500 mg total) by mouth 2 (two) times daily. 10/04/22  Yes Azucena Fallen, MD  traZODone (DESYREL) 100 MG tablet Take 2 tablets (200 mg total) by mouth at bedtime. 06/01/23  Yes Toy Cookey E, NP  ARIPiprazole (ABILIFY) 5 MG tablet Take 1 tablet (5 mg total) by mouth daily. 06/01/23   Shanna Cisco, NP  levETIRAcetam (KEPPRA) 500 MG tablet Take 1 tablet (500 mg total) by mouth 2 (two) times daily. 03/21/23 04/20/23  Sloan Leiter, DO   potassium chloride (KLOR-CON) 20 MEQ packet Take 40 mEq by mouth daily for 15 days. 02/11/23 02/26/23  Terald Sleeper, MD  promethazine (PHENERGAN) 25 MG suppository Place 1 suppository (25 mg total) rectally every 8 (eight) hours as needed for up to 6 days for nausea or vomiting. 02/11/23 02/17/23  Terald Sleeper, MD     Critical care time: NA

## 2023-06-04 NOTE — ED Notes (Signed)
At this time the left ac is back to normal after IV infiltrated.

## 2023-06-05 DIAGNOSIS — E871 Hypo-osmolality and hyponatremia: Secondary | ICD-10-CM

## 2023-06-05 DIAGNOSIS — J850 Gangrene and necrosis of lung: Secondary | ICD-10-CM | POA: Diagnosis not present

## 2023-06-05 DIAGNOSIS — E876 Hypokalemia: Secondary | ICD-10-CM | POA: Diagnosis not present

## 2023-06-05 DIAGNOSIS — G40909 Epilepsy, unspecified, not intractable, without status epilepticus: Secondary | ICD-10-CM | POA: Diagnosis not present

## 2023-06-05 DIAGNOSIS — Z789 Other specified health status: Secondary | ICD-10-CM | POA: Diagnosis not present

## 2023-06-05 LAB — BASIC METABOLIC PANEL
Anion gap: 14 (ref 5–15)
BUN: <5 mg/dL — ABNORMAL LOW (ref 6–20)
CO2: 24 mmol/L (ref 22–32)
Calcium: 8.4 mg/dL — ABNORMAL LOW (ref 8.9–10.3)
Chloride: 93 mmol/L — ABNORMAL LOW (ref 98–111)
Creatinine, Ser: 0.67 mg/dL (ref 0.44–1.00)
GFR, Estimated: >60 mL/min (ref >60)
Glucose, Bld: 76 mg/dL (ref 70–99)
Potassium: 3.1 mmol/L — ABNORMAL LOW (ref 3.5–5.1)
Sodium: 131 mmol/L — ABNORMAL LOW (ref 135–145)

## 2023-06-05 LAB — MAGNESIUM: Magnesium: 1.5 mg/dL — ABNORMAL LOW (ref 1.7–2.4)

## 2023-06-05 MED ORDER — POTASSIUM CHLORIDE CRYS ER 20 MEQ PO TBCR
60.0000 meq | EXTENDED_RELEASE_TABLET | Freq: Two times a day (BID) | ORAL | Status: AC
  Administered 2023-06-05 (×2): 60 meq via ORAL
  Filled 2023-06-05 (×2): qty 3

## 2023-06-05 MED ORDER — SODIUM CHLORIDE 0.9 % IV SOLN: INTRAVENOUS | Status: AC

## 2023-06-05 MED ORDER — POLYETHYLENE GLYCOL 3350 17 G PO PACK
17.0000 g | PACK | Freq: Two times a day (BID) | ORAL | Status: DC
  Administered 2023-06-06 – 2023-06-08 (×5): 17 g via ORAL
  Filled 2023-06-05 (×5): qty 1

## 2023-06-05 MED ORDER — MAGNESIUM SULFATE 4 GM/100ML IV SOLN
4.0000 g | Freq: Once | INTRAVENOUS | Status: AC
  Administered 2023-06-05: 4 g via INTRAVENOUS
  Filled 2023-06-05: qty 100

## 2023-06-05 MED ORDER — DIPHENHYDRAMINE HCL 25 MG PO CAPS
25.0000 mg | ORAL_CAPSULE | Freq: Once | ORAL | Status: DC
  Filled 2023-06-05: qty 1

## 2023-06-05 MED ORDER — NICOTINE 14 MG/24HR TD PT24
14.0000 mg | MEDICATED_PATCH | Freq: Every day | TRANSDERMAL | Status: DC
Start: 2023-06-05 — End: 2023-06-08
  Administered 2023-06-05 – 2023-06-08 (×4): 14 mg via TRANSDERMAL
  Filled 2023-06-05 (×4): qty 1

## 2023-06-05 NOTE — Plan of Care (Signed)
Problem: Education: Goal: Knowledge of General Education information will improve Description: Including pain rating scale, medication(s)/side effects and non-pharmacologic comfort measures Outcome: Progressing   Problem: Activity: Goal: Risk for activity intolerance will decrease Outcome: Progressing   Problem: Coping: Goal: Level of anxiety will decrease Outcome: Progressing   Problem: Elimination: Goal: Will not experience complications related to urinary retention Outcome: Progressing   Problem: Pain Managment: Goal: General experience of comfort will improve Outcome: Progressing   Problem: Safety: Goal: Ability to remain free from injury will improve Outcome: Progressing

## 2023-06-05 NOTE — Progress Notes (Signed)
06/05/2023  Marie Myers [id number] 1991-05-15  CARE TEAM: PCP: Delfino Lovett, FNP  Outpatient Care Team: Patient Care Team: Delfino Lovett, FNP as PCP - General (Pain Medicine)  Inpatient Treatment Team: Treatment Team:  Osvaldo Shipper, MD Armida Sans, MD [facility], Md, MD Fenton Malling, NT Purgason, Wayne Both, RRT [place], Stevphen Meuse, RN Clydia Llano, RN   Problem List:   Principal Problem:   Necrotizing pneumonia [facility]) Active Problems:   Seizure disorder [facility])   Alcohol use   * No surgery found *      Assessment [facility] Stay = 1 days)      No evidence of any perforation at slight 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    Plan:    Given her intermittent nausea and vomiting, would start with the bariatric clear diet advance to advance bariatric diet.  If she tolerates that, then back to a more regular diet as tolerated.  Is no evidence of any bowel obstruction or other etiology for nausea and vomiting.  Other concern with her necrotizing pneumonia could be aspiration but Dr. Ephriam Knuckles is less suspicious.  Will defer to them on need for speech therapy evaluation.  Sounds like she needs to be plugged in and followed up with bariatric care in [place].  She can see [facility] surgery group with one of our bariatric surgeons to determine if she needs anything addressed with her hiatal hernia or other further interventions.  Nothing emergent.  I think would be wise to have the bariatric coordinator see this patient while she is an inpatient to make sure she recover safely, has prior diet education, and has a plan for outpatient follow-up.  -VTE prophylaxis- SCDs, etc  -mobilize as tolerated to help recovery  Discussed with primary service [facility] Dr. Rito Ehrlich.  There is no need for any acute surgical intervention at this time.  Surgery will be available peripherally.      I reviewed nursing notes, hospitalist  notes, last 24 h vitals and pain scores, last 48 h intake and output, last 24 h labs and trends, and last 24 h imaging results.  I have reviewed this patient's available data, including medical history, events of note, test results, etc as part of my evaluation.   A significant portion of that time was spent in counseling. Care during the described time interval was provided by me.  This care required straight-forward level of medical decision making.  06/05/2023    Subjective: (Chief complaint)  Upper GI performed showing no obvious leak.  No major events.  Stable on floor.  Objective:  Vital signs:  Vitals:   06/04/23 1711 06/04/23 1914 06/05/23 0018 06/05/23 0445  BP: (!) 144/90 139/83 129/85 133/85  Pulse: (!) 107 95 100 99  Resp: 16 18 17 17   Temp: 98.2 F (36.8 C) 99.4 F (37.4 C) 99.1 F (37.3 C) 99 F (37.2 C)  TempSrc: Oral     SpO2: 92% 92% 92% 94%  Weight:      Height:        Last BM Date : 06/01/23  Intake/Output   Yesterday:  10/11 0701 - 10/12 0700 In: 858.1 [P.O.:480; IV Piggyback:378.1] Out: -  This shift:  No intake/output data recorded.  Bowel function:  Flatus: YES  BM:  No  Drain: (No drain)      Results:   Cultures: Recent Results (from the past 720 hour(s))  Resp panel by RT-PCR (RSV, Flu A&B, Covid)  Anterior Nasal Swab     Status: None   Collection Time: 06/03/23 11:35 PM   Specimen: Anterior Nasal Swab  Result Value Ref Range Status   SARS Coronavirus 2 by RT PCR NEGATIVE NEGATIVE Final    Comment: (NOTE) SARS-CoV-2 target nucleic acids are NOT DETECTED.  The SARS-CoV-2 RNA is generally detectable in upper respiratory specimens during the acute phase of infection. The lowest concentration of SARS-CoV-2 viral copies this assay can detect is 138 copies/mL. A negative result does not preclude SARS-Cov-2 infection and should not be used as the sole basis for treatment or other patient management decisions. A negative  result may occur with  improper specimen collection/handling, submission of specimen other than nasopharyngeal swab, presence of viral mutation(s) within the areas targeted by this assay, and inadequate number of viral copies(<138 copies/mL). A negative result must be combined with clinical observations, patient history, and epidemiological information. The expected result is Negative.  Fact Sheet for Patients:  BloggerCourse.com  Fact Sheet for Healthcare Providers:  SeriousBroker.it  This test is no t yet approved or cleared by the [place] FDA and  has been authorized for detection and/or diagnosis of SARS-CoV-2 by FDA under an Emergency Use Authorization (EUA). This EUA will remain  in effect (meaning this test can be used) for the duration of the COVID-19 declaration under Section 564(b)(1) of the Act, 21 U.S.C.section 360bbb-3(b)(1), unless the authorization is terminated  or revoked sooner.       Influenza A by PCR NEGATIVE NEGATIVE Final   Influenza B by PCR NEGATIVE NEGATIVE Final    Comment: (NOTE) The Xpert Xpress SARS-CoV-2/FLU/RSV plus assay is intended as an aid in the diagnosis of influenza from Nasopharyngeal swab specimens and should not be used as a sole basis for treatment. Nasal washings and aspirates are unacceptable for Xpert Xpress SARS-CoV-2/FLU/RSV testing.  Fact Sheet for Patients: BloggerCourse.com  Fact Sheet for Healthcare Providers: SeriousBroker.it  This test is not yet approved or cleared by the [place] FDA and has been authorized for detection and/or diagnosis of SARS-CoV-2 by FDA under an Emergency Use Authorization (EUA). This EUA will remain in effect (meaning this test can be used) for the duration of the COVID-19 declaration under Section 564(b)(1) of the Act, 21 U.S.C. section 360bbb-3(b)(1), unless the authorization is  terminated or revoked.     Resp Syncytial Virus by PCR NEGATIVE NEGATIVE Final    Comment: (NOTE) Fact Sheet for Patients: BloggerCourse.com  Fact Sheet for Healthcare Providers: SeriousBroker.it  This test is not yet approved or cleared by the [place] FDA and has been authorized for detection and/or diagnosis of SARS-CoV-2 by FDA under an Emergency Use Authorization (EUA). This EUA will remain in effect (meaning this test can be used) for the duration of the COVID-19 declaration under Section 564(b)(1) of the Act, 21 U.S.C. section 360bbb-3(b)(1), unless the authorization is terminated or revoked.  Performed at [facility], [address], [place], [place] [address]   Blood culture (routine x 2)     Status: None (Preliminary result)   Collection Time: 06/03/23 11:53 PM   Specimen: BLOOD RIGHT HAND  Result Value Ref Range Status   Specimen Description   Final    BLOOD RIGHT HAND Performed at [facility], [address], [place], [place] [address]    Special Requests   Final    BOTTLES DRAWN AEROBIC AND ANAEROBIC Blood Culture adequate volume Performed at [facility], [address]  [place], [place] [address]    Culture  Setup Time   Final    GRAM POSITIVE COCCI IN CLUSTERS ANAEROBIC BOTTLE ONLY CRITICAL RESULT CALLED TO, READ BACK BY AND VERIFIED WITH: PHARMD MICHELLE LILLISTON ON 06/04/23 @ 2233 BY DRT  Performed at [facility], [address] [place], [place] [address]    Culture GRAM POSITIVE COCCI IN CLUSTERS  Final   Report Status PENDING  Incomplete  Blood Culture ID Panel (Reflexed)     Status: Abnormal   Collection Time: 06/03/23 11:53 PM  Result Value Ref Range Status   Enterococcus faecalis NOT DETECTED NOT DETECTED Final   Enterococcus Faecium NOT DETECTED NOT DETECTED Final   Listeria monocytogenes NOT DETECTED NOT DETECTED Final    Staphylococcus species DETECTED (A) NOT DETECTED Final    Comment: CRITICAL RESULT CALLED TO, READ BACK BY AND VERIFIED WITH: PHARMD MICHELLE LILLISTON ON 06/04/23 @ 2233 BY DRT     Staphylococcus aureus (BCID) NOT DETECTED NOT DETECTED Final   Staphylococcus epidermidis NOT DETECTED NOT DETECTED Final   Staphylococcus lugdunensis NOT DETECTED NOT DETECTED Final   Streptococcus species NOT DETECTED NOT DETECTED Final   Streptococcus agalactiae NOT DETECTED NOT DETECTED Final   Streptococcus pneumoniae NOT DETECTED NOT DETECTED Final   Streptococcus pyogenes NOT DETECTED NOT DETECTED Final   A.calcoaceticus-baumannii NOT DETECTED NOT DETECTED Final   Bacteroides fragilis NOT DETECTED NOT DETECTED Final   Enterobacterales NOT DETECTED NOT DETECTED Final   Enterobacter cloacae complex NOT DETECTED NOT DETECTED Final   Escherichia coli NOT DETECTED NOT DETECTED Final   Klebsiella aerogenes NOT DETECTED NOT DETECTED Final   Klebsiella oxytoca NOT DETECTED NOT DETECTED Final   Klebsiella pneumoniae NOT DETECTED NOT DETECTED Final   Proteus species NOT DETECTED NOT DETECTED Final   Salmonella species NOT DETECTED NOT DETECTED Final   Serratia marcescens NOT DETECTED NOT DETECTED Final   Haemophilus influenzae NOT DETECTED NOT DETECTED Final   Neisseria meningitidis NOT DETECTED NOT DETECTED Final   Pseudomonas aeruginosa NOT DETECTED NOT DETECTED Final   Stenotrophomonas maltophilia NOT DETECTED NOT DETECTED Final   Candida albicans NOT DETECTED NOT DETECTED Final   Candida auris NOT DETECTED NOT DETECTED Final   Candida glabrata NOT DETECTED NOT DETECTED Final   Candida krusei NOT DETECTED NOT DETECTED Final   Candida parapsilosis NOT DETECTED NOT DETECTED Final   Candida tropicalis NOT DETECTED NOT DETECTED Final   Cryptococcus neoformans/gattii NOT DETECTED NOT DETECTED Final    Comment: Performed at [facility], [address] [place], [place] [address]  Blood culture (routine  x 2)     Status: None (Preliminary result)   Collection Time: 06/04/23 12:00 AM   Specimen: BLOOD LEFT HAND  Result Value Ref Range Status   Specimen Description   Final    BLOOD LEFT HAND Performed at [facility], [address], [place], [place] [address]    Special Requests   Final    BOTTLES DRAWN AEROBIC AND ANAEROBIC Blood Culture results may not be optimal due to an excessive volume of blood received in culture bottles Performed at [facility], [address], [place], [place] [address]    Culture   Final    NO GROWTH < 12 HOURS Performed at [facility], [address], [place], [place] [address]    Report Status PENDING  Incomplete  MRSA Next Gen by PCR, Nasal     Status: None   Collection Time:  06/04/23  2:12 AM   Specimen: Nasal Mucosa; Nasal Swab  Result Value Ref Range Status   MRSA by PCR Next Gen NOT DETECTED NOT DETECTED Final    Comment: (NOTE) The GeneXpert MRSA Assay (FDA approved for NASAL specimens only), is one component of a comprehensive MRSA colonization surveillance program. It is not intended to diagnose MRSA infection nor to guide or monitor treatment for MRSA infections. Test performance is not FDA approved in patients less than 65 years old. Performed at [facility], [address], [place], [place] [address]     Labs: Results for orders placed or performed during the hospital encounter of 06/03/23 (from the past 48 hour(s))  Basic metabolic panel     Status: Abnormal   Collection Time: 06/03/23  2:33 PM  Result Value Ref Range   Sodium 135 135 - 145 mmol/L   Potassium 3.6 3.5 - 5.1 mmol/L   Chloride 95 (L) 98 - 111 mmol/L   CO2 22 22 - 32 mmol/L   Glucose, Bld 95 70 - 99 mg/dL    Comment: Glucose reference range applies only to samples taken after fasting for at least 8 hours.   BUN <5 (L) 6 - 20 mg/dL   Creatinine, Ser 6.04 0.44 - 1.00 mg/dL   Calcium 8.6 (L) 8.9 - 10.3  mg/dL   GFR, Estimated >54 >09 mL/min    Comment: (NOTE) Calculated using the CKD-EPI Creatinine Equation (2021)    Anion gap 18 (H) 5 - 15    Comment: Performed at [facility], [address] [place], [place] [address]  CBC     Status: Abnormal   Collection Time: 06/03/23  2:33 PM  Result Value Ref Range   WBC 14.9 (H) 4.0 - 10.5 K/uL   RBC 3.62 (L) 3.87 - 5.11 MIL/uL   Hemoglobin 13.1 12.0 - 15.0 g/dL   HCT 47.8 29.5 - 62.1 %   MCV 104.1 (H) 80.0 - 100.0 fL   MCH 36.2 (H) 26.0 - 34.0 pg   MCHC 34.7 30.0 - 36.0 g/dL   RDW 30.8 (H) 65.7 - 84.6 %   Platelets 198 150 - 400 K/uL   nRBC 0.0 0.0 - 0.2 %    Comment: Performed at [facility], [address], [place], [place] [address]  Troponin I (High Sensitivity)     Status: None   Collection Time: 06/03/23  2:33 PM  Result Value Ref Range   Troponin I (High Sensitivity) <2 <18 ng/L    Comment: (NOTE) Elevated high sensitivity troponin I (hsTnI) values and significant  changes across serial measurements may suggest ACS but many other  chronic and acute conditions are known to elevate hsTnI results.  Refer to the "Links" section for chest pain algorithms and additional  guidance. Performed at [facility], [address], [place], [place] [address]   hCG, serum, qualitative     Status: None   Collection Time: 06/03/23  2:33 PM  Result Value Ref Range   Preg, Serum NEGATIVE NEGATIVE    Comment:        THE SENSITIVITY OF THIS METHODOLOGY IS >10 mIU/mL. Performed at [facility], [address] [place], [place] [address]   Hepatic function panel     Status: Abnormal   Collection Time: 06/03/23  2:33 PM  Result Value Ref Range   Total Protein 7.3 6.5 - 8.1 g/dL   Albumin 3.9 3.5 - 5.0 g/dL  AST 114 (H) 15 - 41 U/L   ALT 40 0 - 44 U/L   Alkaline Phosphatase 102 38 - 126 U/L   Total Bilirubin 0.7 0.3 - 1.2 mg/dL   Bilirubin, Direct 0.2 0.0 - 0.2  mg/dL   Indirect Bilirubin 0.5 0.3 - 0.9 mg/dL    Comment: Performed at [facility], [address], [place], [place] [address]  Lipase, blood     Status: None   Collection Time: 06/03/23  2:33 PM  Result Value Ref Range   Lipase 21 11 - 51 U/L    Comment: Performed at [facility], [address] [place], [place] [address]  Resp panel by RT-PCR (RSV, Flu A&B, Covid) Anterior Nasal Swab     Status: None   Collection Time: 06/03/23 11:35 PM   Specimen: Anterior Nasal Swab  Result Value Ref Range   SARS Coronavirus 2 by RT PCR NEGATIVE NEGATIVE    Comment: (NOTE) SARS-CoV-2 target nucleic acids are NOT DETECTED.  The SARS-CoV-2 RNA is generally detectable in upper respiratory specimens during the acute phase of infection. The lowest concentration of SARS-CoV-2 viral copies this assay can detect is 138 copies/mL. A negative result does not preclude SARS-Cov-2 infection and should not be used as the sole basis for treatment or other patient management decisions. A negative result may occur with  improper specimen collection/handling, submission of specimen other than nasopharyngeal swab, presence of viral mutation(s) within the areas targeted by this assay, and inadequate number of viral copies(<138 copies/mL). A negative result must be combined with clinical observations, patient history, and epidemiological information. The expected result is Negative.  Fact Sheet for Patients:  BloggerCourse.com  Fact Sheet for Healthcare Providers:  SeriousBroker.it  This test is no t yet approved or cleared by the [place] FDA and  has been authorized for detection and/or diagnosis of SARS-CoV-2 by FDA under an Emergency Use Authorization (EUA). This EUA will remain  in effect (meaning this test can be used) for the duration of the COVID-19 declaration under Section 564(b)(1) of the Act,  21 U.S.C.section 360bbb-3(b)(1), unless the authorization is terminated  or revoked sooner.       Influenza A by PCR NEGATIVE NEGATIVE   Influenza B by PCR NEGATIVE NEGATIVE    Comment: (NOTE) The Xpert Xpress SARS-CoV-2/FLU/RSV plus assay is intended as an aid in the diagnosis of influenza from Nasopharyngeal swab specimens and should not be used as a sole basis for treatment. Nasal washings and aspirates are unacceptable for Xpert Xpress SARS-CoV-2/FLU/RSV testing.  Fact Sheet for Patients: BloggerCourse.com  Fact Sheet for Healthcare Providers: SeriousBroker.it  This test is not yet approved or cleared by the [place] FDA and has been authorized for detection and/or diagnosis of SARS-CoV-2 by FDA under an Emergency Use Authorization (EUA). This EUA will remain in effect (meaning this test can be used) for the duration of the COVID-19 declaration under Section 564(b)(1) of the Act, 21 U.S.C. section 360bbb-3(b)(1), unless the authorization is terminated or revoked.     Resp Syncytial Virus by PCR NEGATIVE NEGATIVE    Comment: (NOTE) Fact Sheet for Patients: BloggerCourse.com  Fact Sheet for Healthcare Providers: SeriousBroker.it  This test is not yet approved or cleared by the [place] FDA and has been authorized for detection and/or diagnosis of SARS-CoV-2 by FDA under an Emergency Use Authorization (EUA). This EUA will remain in effect (meaning this test can be used) for the duration of the COVID-19 declaration under Section  564(b)(1) of the Act, 21 U.S.C. section 360bbb-3(b)(1), unless the authorization is terminated or revoked.  Performed at [facility], [address], [place], [place] [address]   Lactic acid, plasma     Status: None   Collection Time: 06/03/23 11:35 PM  Result Value Ref Range   Lactic Acid, Venous 1.7 0.5 - 1.9  mmol/L    Comment: Performed at [facility], [address] [place], [place] [address]  Blood culture (routine x 2)     Status: None (Preliminary result)   Collection Time: 06/03/23 11:53 PM   Specimen: BLOOD RIGHT HAND  Result Value Ref Range   Specimen Description      BLOOD RIGHT HAND Performed at [facility], [address], [place], [place] [address]    Special Requests      BOTTLES DRAWN AEROBIC AND ANAEROBIC Blood Culture adequate volume Performed at [facility], [address] [place], [place] [address]    Culture  Setup Time      GRAM POSITIVE COCCI IN CLUSTERS ANAEROBIC BOTTLE ONLY CRITICAL RESULT CALLED TO, READ BACK BY AND VERIFIED WITH: PHARMD MICHELLE LILLISTON ON 06/04/23 @ 2233 BY DRT  Performed at [facility], [address] [place], [place] [address]    Culture GRAM POSITIVE COCCI IN CLUSTERS    Report Status PENDING   Blood Culture ID Panel (Reflexed)     Status: Abnormal   Collection Time: 06/03/23 11:53 PM  Result Value Ref Range   Enterococcus faecalis NOT DETECTED NOT DETECTED   Enterococcus Faecium NOT DETECTED NOT DETECTED   Listeria monocytogenes NOT DETECTED NOT DETECTED   Staphylococcus species DETECTED (A) NOT DETECTED    Comment: CRITICAL RESULT CALLED TO, READ BACK BY AND VERIFIED WITH: PHARMD MICHELLE LILLISTON ON 06/04/23 @ 2233 BY DRT     Staphylococcus aureus (BCID) NOT DETECTED NOT DETECTED   Staphylococcus epidermidis NOT DETECTED NOT DETECTED   Staphylococcus lugdunensis NOT DETECTED NOT DETECTED   Streptococcus species NOT DETECTED NOT DETECTED   Streptococcus agalactiae NOT DETECTED NOT DETECTED   Streptococcus pneumoniae NOT DETECTED NOT DETECTED   Streptococcus pyogenes NOT DETECTED NOT DETECTED   A.calcoaceticus-baumannii NOT DETECTED NOT DETECTED   Bacteroides fragilis NOT DETECTED NOT DETECTED   Enterobacterales NOT DETECTED NOT DETECTED   Enterobacter cloacae  complex NOT DETECTED NOT DETECTED   Escherichia coli NOT DETECTED NOT DETECTED   Klebsiella aerogenes NOT DETECTED NOT DETECTED   Klebsiella oxytoca NOT DETECTED NOT DETECTED   Klebsiella pneumoniae NOT DETECTED NOT DETECTED   Proteus species NOT DETECTED NOT DETECTED   Salmonella species NOT DETECTED NOT DETECTED   Serratia marcescens NOT DETECTED NOT DETECTED   Haemophilus influenzae NOT DETECTED NOT DETECTED   Neisseria meningitidis NOT DETECTED NOT DETECTED   Pseudomonas aeruginosa NOT DETECTED NOT DETECTED   Stenotrophomonas maltophilia NOT DETECTED NOT DETECTED   Candida albicans NOT DETECTED NOT DETECTED   Candida auris NOT DETECTED NOT DETECTED   Candida glabrata NOT DETECTED NOT DETECTED   Candida krusei NOT DETECTED NOT DETECTED   Candida parapsilosis NOT DETECTED NOT DETECTED   Candida tropicalis NOT DETECTED NOT DETECTED   Cryptococcus neoformans/gattii NOT DETECTED NOT DETECTED    Comment: Performed at [facility], [address], [place], [place] [address]  Blood culture (routine x 2)     Status: None (Preliminary result)   Collection Time: 06/04/23 12:00 AM   Specimen: BLOOD LEFT HAND  Result Value Ref  Range   Specimen Description      BLOOD LEFT HAND Performed at [facility], [address], [place], [place] [address]    Special Requests      BOTTLES DRAWN AEROBIC AND ANAEROBIC Blood Culture results may not be optimal due to an excessive volume of blood received in culture bottles Performed at [facility], [address] [place], [place] [address]    Culture      NO GROWTH < 12 HOURS Performed at [facility], [address] [place], [place] [address]    Report Status PENDING   Lactic acid, plasma     Status: None   Collection Time: 06/04/23  2:11 AM  Result Value Ref Range   Lactic Acid, Venous 1.5 0.5 - 1.9 mmol/L    Comment: Performed at [facility], [address], [place],  [place] [address]  MRSA Next Gen by PCR, Nasal     Status: None   Collection Time: 06/04/23  2:12 AM   Specimen: Nasal Mucosa; Nasal Swab  Result Value Ref Range   MRSA by PCR Next Gen NOT DETECTED NOT DETECTED    Comment: (NOTE) The GeneXpert MRSA Assay (FDA approved for NASAL specimens only), is one component of a comprehensive MRSA colonization surveillance program. It is not intended to diagnose MRSA infection nor to guide or monitor treatment for MRSA infections. Test performance is not FDA approved in patients less than 49 years old. Performed at [facility], [address], [place], [place] [address]   Basic metabolic panel     Status: Abnormal   Collection Time: 06/04/23  8:34 AM  Result Value Ref Range   Sodium 131 (L) 135 - 145 mmol/L   Potassium 3.4 (L) 3.5 - 5.1 mmol/L   Chloride 94 (L) 98 - 111 mmol/L   CO2 24 22 - 32 mmol/L   Glucose, Bld 79 70 - 99 mg/dL    Comment: Glucose reference range applies only to samples taken after fasting for at least 8 hours.   BUN <5 (L) 6 - 20 mg/dL   Creatinine, Ser 2.44 0.44 - 1.00 mg/dL   Calcium 8.3 (L) 8.9 - 10.3 mg/dL   GFR, Estimated >01 >02 mL/min    Comment: (NOTE) Calculated using the CKD-EPI Creatinine Equation (2021)    Anion gap 13 5 - 15    Comment: Performed at [facility], [address] [place], [place] [address]  CBC with Differential/Platelet     Status: Abnormal   Collection Time: 06/04/23  8:34 AM  Result Value Ref Range   WBC 11.1 (H) 4.0 - 10.5 K/uL   RBC 2.85 (L) 3.87 - 5.11 MIL/uL   Hemoglobin 10.5 (L) 12.0 - 15.0 g/dL   HCT 64.4 (L) 03.4 - 74.2 %   MCV 106.3 (H) 80.0 - 100.0 fL   MCH 36.8 (H) 26.0 - 34.0 pg   MCHC 34.7 30.0 - 36.0 g/dL   RDW 59.5 (H) 63.8 - 75.6 %   Platelets 155 150 - 400 K/uL   nRBC 0.0 0.0 - 0.2 %   Neutrophils Relative % 82 %   Neutro Abs 9.1 (H) 1.7 - 7.7 K/uL   Lymphocytes Relative 10 %   Lymphs Abs 1.1 0.7 - 4.0 K/uL   Monocytes Relative 7 %    Monocytes Absolute 0.8 0.1 - 1.0 K/uL   Eosinophils Relative 0 %   Eosinophils Absolute 0.0 0.0 - 0.5  K/uL   Basophils Relative 0 %   Basophils Absolute 0.0 0.0 - 0.1 K/uL   Immature Granulocytes 1 %   Abs Immature Granulocytes 0.08 (H) 0.00 - 0.07 K/uL    Comment: Performed at [facility], [address] [place], [place] [address]  HIV Antibody (routine testing w rflx)     Status: None   Collection Time: 06/04/23  8:34 AM  Result Value Ref Range   HIV Screen 4th Generation wRfx Non Reactive Non Reactive    Comment: Performed at [facility], [address], [place], [place] [address]    Imaging / Studies: DG UGI W SINGLE CM (SOL OR THIN BA)  Result Date: 06/04/2023 CLINICAL DATA:  History of gastric bypass and right-sided necrotizing pneumonia. Rule out esophageal leak. EXAM: WATER SOLUBLE UPPER GI SERIES TECHNIQUE: Single-column upper GI series was performed using water soluble contrast. Radiation Exposure Index (as provided by the fluoroscopic device): 14.5 mGy Kerma CONTRAST:  50 cc of Omnipaque 300 COMPARISON:  Yesterday's chest abdomen and pelvic CTs FINDINGS: Patient was unable to tolerate oblique or prone imaging. Single-contrast focused exam was performed with the patient supine. Preprocedure scout film is unremarkable. Left proximal femur fixation. Degenerative changes of the right hip are significantly age advanced. 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 Normal esophageal caliber. No significant hiatal hernia. Contrast stasis throughout the esophagus suggests a component of dysmotility. No contrast extravasation to suggest esophageal perforation. IMPRESSION: No evidence of esophageal perforation or significant hiatal hernia. Limited single-contrast exam performed with the patient supine secondary to immobility as detailed above. Electronically Signed   By: Jeronimo Greaves M.D.   On: 06/04/2023 15:46   CT ABDOMEN PELVIS W CONTRAST  Result  Date: 06/03/2023 CLINICAL DATA:  Acute abdominal pain. EXAM: CT ABDOMEN AND PELVIS WITH CONTRAST TECHNIQUE: Multidetector CT imaging of the abdomen and pelvis was performed using the standard protocol following bolus administration of intravenous contrast. RADIATION DOSE REDUCTION: This exam was performed according to the departmental dose-optimization program which includes automated exposure control, adjustment of the mA and/or kV according to patient size and/or use of iterative reconstruction technique. CONTRAST:  OMNIPAQUE IOHEXOL 350 MG/ML SOLN COMPARISON:  CT 02/11/2023 FINDINGS: Lower chest: Assessed on concurrent chest CT, reported separately. Hepatobiliary: Advanced hepatic steatosis. More focal fatty infiltration adjacent to the falciform ligament. The liver is enlarged spanning 19.6 cm cranial caudal. Clips in the gallbladder fossa postcholecystectomy. No biliary dilatation. Pancreas: Unremarkable. No pancreatic ductal dilatation or surrounding inflammatory changes. Spleen: Normal in size without focal abnormality. Adrenals/Urinary Tract: No adrenal nodule. No hydronephrosis, renal inflammation or focal renal abnormality. Moderately distended bladder, normal for degree of distension. 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 The excluded gastric remnant is minimally distended with fluid. The Roux limb is nondilated. The jejunal anastomosis is unremarkable. There is no bowel obstruction or inflammatory change. Small volume of stool in the colon. Normal appendix visualized. Vascular/Lymphatic: No acute vascular findings. Normal caliber abdominal aorta. Patent portal vein. No abdominopelvic adenopathy. Reproductive: Uterus and bilateral adnexa are unremarkable. Other: No free air, free fluid, or intra-abdominal fluid collection. Musculoskeletal: There are no acute or suspicious osseous abnormalities. Surgical screw traverses  the left intertrochanteric femur. Chronic changes of the bilateral hips. IMPRESSION: 1. No acute abnormality in the abdomen/pelvis. 2. Hepatomegaly and advanced hepatic steatosis. 3. Bariatric surgery with gastric suture line crossing the diaphragmatic hiatus and small hiatal hernia. Electronically Signed  By: Narda Rutherford M.D.   On: 06/03/2023 22:52   CT Angio Chest PE W and/or Wo Contrast  Result Date: 06/03/2023 CLINICAL DATA:  Pulmonary embolism (PE) suspected, high prob Right-sided pain. EXAM: CT ANGIOGRAPHY CHEST WITH CONTRAST TECHNIQUE: Multidetector CT imaging of the chest was performed using the standard protocol during bolus administration of intravenous contrast. Multiplanar CT image reconstructions and MIPs were obtained to evaluate the vascular anatomy. RADIATION DOSE REDUCTION: This exam was performed according to the departmental dose-optimization program which includes automated exposure control, adjustment of the mA and/or kV according to patient size and/or use of iterative reconstruction technique. CONTRAST:  OMNIPAQUE IOHEXOL 350 MG/ML SOLN COMPARISON:  Chest radiograph earlier today.  Chest CT 10/16/2020 FINDINGS: Cardiovascular: There are no filling defects within the pulmonary arteries to suggest pulmonary embolus. Evaluation is diagnostic to the segmental level. The subsegmental branches are not well assessed due to phase of contrast. Mild dilatation of the main pulmonary artery at 3 cm. Thoracic aorta is normal in caliber. Common origin of the brachiocephalic and left common carotid artery, variant anatomy. The heart is upper normal in size. No pericardial effusion. Mediastinum/Nodes: Prominent right hilar node at 10 mm. No mediastinal adenopathy. There is a small hiatal hernia. Bariatric surgery with gastric suture line crossing the diaphragmatic hiatus. Lungs/Pleura: Elongated cavitary process in the periphery of the right upper and right lower lobe. Above the major fissure  there is a peripheral consolidative opacity with surrounding ground-glass and central focus of air. This extends inferiorly in the subpleural space into the right lower lobe where there is a sub pulmonic air-fluid collection measuring 4.5 x 1.9 x 7 cm, series 12, image 69 there is also a small dependent right pleural effusion. The left lung is clear. There are no features of pulmonary edema. Trachea and central airways are clear. Upper Abdomen: Assessed on concurrent abdominopelvic CT, reported separately. Musculoskeletal: There are no acute or suspicious osseous abnormalities. No rib fracture, destructive or erosive changes, particularly of right ribs. Review of the MIP images confirms the above findings. IMPRESSION: 1. No pulmonary embolus. 2. Elongated cavitary process in the periphery of the right upper and right lower lobe. Right upper lobe peripheral consolidative opacity with surrounding ground-glass and central focus of air. This extends inferiorly in the subpleural space into the right lower lobe where there is a sub pulmonic air-fluid collection measuring 4.5 x 1.9 x 7 cm. There is also a small dependent right pleural effusion. Findings are most consistent with necrotizing pneumonia. Recommend follow-up imaging to resolution. 3. Prominent right hilar node at 10 mm, likely reactive. 4. Small hiatal hernia. Bariatric surgery with gastric suture line crossing the diaphragmatic hiatus. 5. Mild dilatation of the main pulmonary artery, can be seen with pulmonary arterial hypertension. Electronically Signed   By: Narda Rutherford M.D.   On: 06/03/2023 22:49   DG Chest 2 View  Result Date: 06/03/2023 CLINICAL DATA:  Chest pain. EXAM: CHEST - 2 VIEW COMPARISON:  02/11/2023. FINDINGS: Bilateral lung fields are clear. There is trace right pleural effusion, new since the prior study. Left lateral costophrenic angle is clear. No pneumothorax on either side. Normal cardio-mediastinal silhouette. No acute osseous  abnormalities. The soft tissues are within normal limits. IMPRESSION: *New trace right pleural effusion. Electronically Signed   By: Jules Schick M.D.   On: 06/03/2023 15:12    Medications / Allergies: per chart  Antibiotics: Anti-infectives (From admission, onward)    Start     Dose/Rate Route Frequency  Ordered Stop   06/04/23 0600  Ampicillin-Sulbactam (UNASYN) 3 g in sodium chloride 0.9 % 100 mL IVPB        3 g 200 mL/hr over 30 Minutes Intravenous Every 6 hours 06/04/23 0155     06/03/23 2330  piperacillin-tazobactam (ZOSYN) IVPB 3.375 g        3.375 g 100 mL/hr over 30 Minutes Intravenous  Once 06/03/23 2319 06/04/23 0036   06/03/23 2330  vancomycin (VANCOREADY) IVPB 1750 mg/350 mL        1,750 mg 175 mL/hr over 120 Minutes Intravenous  Once 06/03/23 2319 06/04/23 0346         Note: Portions of this report may have been transcribed using voice recognition software. Every effort was made to ensure accuracy; however, inadvertent computerized transcription errors may be present.   Any transcriptional errors that result from this process are unintentional.    Ardeth Sportsman, MD, FACS, MASCRS Esophageal, Gastrointestinal & Colorectal Surgery Robotic and Minimally Invasive Surgery  [facility] [address] [place], [place] [address] [phone] Fax [phone] Main  CONTACT INFORMATION: Weekday (9AM-5PM): Call [facility] main office at [phone] Weeknight (5PM-9AM) or Weekend/Holiday: Check [facility]IC "Web Links" tab & use "AMION" (password " [email or web address]") for General Surgery [facility] coverage  Please, DO NOT use SecureChat  (it is not reliable communication to reach operating surgeons & will lead to a delay in care).   [facility] staff messaging available for outptient concerns needing 1-2 business day response.      06/05/2023  7:42 AM

## 2023-06-05 NOTE — Plan of Care (Signed)
Problem: Education: Goal: Knowledge of General Education information will improve Description: Including pain rating scale, medication(s)/side effects and non-pharmacologic comfort measures Outcome: Progressing   Problem: Clinical Measurements: Goal: Ability to maintain clinical measurements within normal limits will improve Outcome: Progressing   Problem: Activity: Goal: Risk for activity intolerance will decrease Outcome: Progressing   Problem: Nutrition: Goal: Adequate nutrition will be maintained Outcome: Progressing   Problem: Coping: Goal: Level of anxiety will decrease Outcome: Progressing   Problem: Pain Managment: Goal: General experience of comfort will improve Outcome: Progressing   Haydee Salter, RN 06/05/23 9:41 AM

## 2023-06-05 NOTE — Progress Notes (Signed)
[facility] PROGRESS NOTE   Marie Myers ZOX:[id number] DOB: 03/01/1991 DOA: 06/03/2023  PCP: Delfino Lovett, FNP  Brief History: 32 y.o. female with medical history significant for depression, EtOH use (psychiatrist concerned about EtOH abuse), seizure disorder on keppra.  Most recent seizure episode was 2 weeks prior to admission.  Came in with 1 week history of right sided chest pain.  Was found to have concern for necrotizing pneumonia and lung abscess.  She was hospitalized for further management.  Consultants: Pulmonology.  General surgery  Procedures: None yet    Subjective/Interval History: Complains of right sided chest pain but slightly better compared to yesterday.  Has not had any vomiting in the last 12 hours.  Denies any headaches.  Some upper abdominal discomfort is still present.    Assessment/Plan:  Necrotizing pneumonia/lung abscess This is in the setting of recurrent episodes of nausea and vomiting as well as seizure episodes.  She likely aspirated from her episodes of emesis.  Do not suspect any oropharyngeal etiology at this time. Seen by pulmonology. Continue with the Unasyn.  MRSA PCR is negative. QuantiFERON is still pending.  Airborne precautions for now. HIV is nonreactive.  Positive blood culture 1 set of blood culture positive for GPC.  Coag negative staph.  Likely a contaminant.  She is afebrile.  No change in antibiotics for now.  Wait for final identification.  Nausea and vomiting in the setting of previous bariatric surgery CT scan showed the suture line crossing the diaphragm.  General surgery was consulted.  Upper GI series was done which did not show any leakage or perforation.  Continue just with clear liquids for now.  Appreciate their assistance. Continue PPI  Seizure disorder Continue Keppra.  Alcohol use disorder CIWA protocol.  Monitor for signs of withdrawal.  Hypokalemia/hyponatremia Supplemented yesterday but remains  low.  Will give additional supplements.  Check magnesium level. Sodium level is stable. There is likely an element of hypovolemia.  Will hydrate for 12 hours.  Macrocytic anemia Drop in hemoglobin is dilutional.  Check anemia panel and TSH.  No evidence for overt bleeding.   DVT Prophylaxis: Lovenox Code Status: Full code Family Communication: Discussed with patient Disposition Plan: Hopefully return home when improved  Status is: Inpatient Remains inpatient appropriate because: Lung abscess requiring IV antibiotics      Medications: Scheduled:  ARIPiprazole  5 mg Oral Daily   diphenhydrAMINE  25 mg Oral Once   enoxaparin (LOVENOX) injection  40 mg Subcutaneous Q24H   folic acid  1 mg Oral Daily   levETIRAcetam  500 mg Oral BID   multivitamin with minerals  1 tablet Oral Daily   pantoprazole (PROTONIX) IV  40 mg Intravenous Q12H   polyethylene glycol  17 g Oral BID   thiamine  100 mg Oral Daily   Or   thiamine  100 mg Intravenous Daily   traZODone  200 mg Oral QHS   Continuous:  ampicillin-sulbactam (UNASYN) IV 3 g (06/05/23 8119)   JYN:WGNFAOZHYQMVH, camphor-menthol, HYDROmorphone (DILAUDID) injection, hydrOXYzine, ketorolac, LORazepam **OR** LORazepam, oxyCODONE  Antibiotics: Anti-infectives (From admission, onward)    Start     Dose/Rate Route Frequency Ordered Stop   06/04/23 0600  Ampicillin-Sulbactam (UNASYN) 3 g in sodium chloride 0.9 % 100 mL IVPB        3 g 200 mL/hr over 30 Minutes Intravenous Every 6 hours 06/04/23 0155     06/03/23 2330  piperacillin-tazobactam (ZOSYN) IVPB 3.375 g  3.375 g 100 mL/hr over 30 Minutes Intravenous  Once 06/03/23 2319 06/04/23 0036   06/03/23 2330  vancomycin (VANCOREADY) IVPB 1750 mg/350 mL        1,750 mg 175 mL/hr over 120 Minutes Intravenous  Once 06/03/23 2319 06/04/23 0346       Objective:  Vital Signs  Vitals:   06/04/23 1711 06/04/23 1914 06/05/23 0018 06/05/23 0445  BP: (!) 144/90 139/83 129/85  133/85  Pulse: (!) 107 95 100 99  Resp: 16 18 17 17   Temp: 98.2 F (36.8 C) 99.4 F (37.4 C) 99.1 F (37.3 C) 99 F (37.2 C)  TempSrc: Oral     SpO2: 92% 92% 92% 94%  Weight:      Height:        Intake/Output Summary (Last 24 hours) at 06/05/2023 0954 Last data filed at 06/05/2023 4098 Gross per 24 hour  Intake 758.1 ml  Output --  Net 758.1 ml   Filed Weights   06/03/23 1307  Weight: 80.7 kg    General appearance: Awake alert.  In no distress Resp: Poor effort with crackles on the right side.  No wheezing or rhonchi. Cardio: S1-S2 is normal regular.  No S3-S4.  No rubs murmurs or bruit GI: Abdomen is soft.  Tender in the epigastrium without any rebound rigidity or guarding.  No masses organomegaly. Extremities: No edema.  Full range of motion of lower extremities. Neurologic: Alert and oriented x3.  No focal neurological deficits.    Lab Results:  Data Reviewed: I have personally reviewed following labs and reports of the imaging studies  CBC: Recent Labs  Lab 06/03/23 1433 06/04/23 0834  WBC 14.9* 11.1*  NEUTROABS  --  9.1*  HGB 13.1 10.5*  HCT 37.7 30.3*  MCV 104.1* 106.3*  PLT 198 155    Basic Metabolic Panel: Recent Labs  Lab 06/03/23 1433 06/04/23 0834 06/05/23 0753  NA 135 131* 131*  K 3.6 3.4* 3.1*  CL 95* 94* 93*  CO2 22 24 24   GLUCOSE 95 79 76  BUN <5* <5* <5*  CREATININE 0.48 0.54 0.67  CALCIUM 8.6* 8.3* 8.4*    GFR: Estimated Creatinine Clearance: 109.2 mL/min (by C-G formula based on SCr of 0.67 mg/dL).  Liver Function Tests: Recent Labs  Lab 06/03/23 1433  AST 114*  ALT 40  ALKPHOS 102  BILITOT 0.7  PROT 7.3  ALBUMIN 3.9    Recent Labs  Lab 06/03/23 1433  LIPASE 21     Recent Results (from the past 240 hour(s))  Resp panel by RT-PCR (RSV, Flu A&B, Covid) Anterior Nasal Swab     Status: None   Collection Time: 06/03/23 11:35 PM   Specimen: Anterior Nasal Swab  Result Value Ref Range Status   SARS Coronavirus 2  by RT PCR NEGATIVE NEGATIVE Final    Comment: (NOTE) SARS-CoV-2 target nucleic acids are NOT DETECTED.  The SARS-CoV-2 RNA is generally detectable in upper respiratory specimens during the acute phase of infection. The lowest concentration of SARS-CoV-2 viral copies this assay can detect is 138 copies/mL. A negative result does not preclude SARS-Cov-2 infection and should not be used as the sole basis for treatment or other patient management decisions. A negative result may occur with  improper specimen collection/handling, submission of specimen other than nasopharyngeal swab, presence of viral mutation(s) within the areas targeted by this assay, and inadequate number of viral copies(<138 copies/mL). A negative result must be combined with clinical observations, patient history, and epidemiological information.  The expected result is Negative.  Fact Sheet for Patients:  BloggerCourse.com  Fact Sheet for Healthcare Providers:  SeriousBroker.it  This test is no t yet approved or cleared by the [place] FDA and  has been authorized for detection and/or diagnosis of SARS-CoV-2 by FDA under an Emergency Use Authorization (EUA). This EUA will remain  in effect (meaning this test can be used) for the duration of the COVID-19 declaration under Section 564(b)(1) of the Act, 21 U.S.C.section 360bbb-3(b)(1), unless the authorization is terminated  or revoked sooner.       Influenza A by PCR NEGATIVE NEGATIVE Final   Influenza B by PCR NEGATIVE NEGATIVE Final    Comment: (NOTE) The Xpert Xpress SARS-CoV-2/FLU/RSV plus assay is intended as an aid in the diagnosis of influenza from Nasopharyngeal swab specimens and should not be used as a sole basis for treatment. Nasal washings and aspirates are unacceptable for Xpert Xpress SARS-CoV-2/FLU/RSV testing.  Fact Sheet for Patients: BloggerCourse.com  Fact  Sheet for Healthcare Providers: SeriousBroker.it  This test is not yet approved or cleared by the [place] FDA and has been authorized for detection and/or diagnosis of SARS-CoV-2 by FDA under an Emergency Use Authorization (EUA). This EUA will remain in effect (meaning this test can be used) for the duration of the COVID-19 declaration under Section 564(b)(1) of the Act, 21 U.S.C. section 360bbb-3(b)(1), unless the authorization is terminated or revoked.     Resp Syncytial Virus by PCR NEGATIVE NEGATIVE Final    Comment: (NOTE) Fact Sheet for Patients: BloggerCourse.com  Fact Sheet for Healthcare Providers: SeriousBroker.it  This test is not yet approved or cleared by the [place] FDA and has been authorized for detection and/or diagnosis of SARS-CoV-2 by FDA under an Emergency Use Authorization (EUA). This EUA will remain in effect (meaning this test can be used) for the duration of the COVID-19 declaration under Section 564(b)(1) of the Act, 21 U.S.C. section 360bbb-3(b)(1), unless the authorization is terminated or revoked.  Performed at [facility], [address] [place], [place] [address]   Blood culture (routine x 2)     Status: None (Preliminary result)   Collection Time: 06/03/23 11:53 PM   Specimen: BLOOD RIGHT HAND  Result Value Ref Range Status   Specimen Description   Final    BLOOD RIGHT HAND Performed at [facility], [address] [place], [place] [address]    Special Requests   Final    BOTTLES DRAWN AEROBIC AND ANAEROBIC Blood Culture adequate volume Performed at [facility], [address] [place], [place] [address]    Culture  Setup Time   Final    GRAM POSITIVE COCCI IN CLUSTERS ANAEROBIC BOTTLE ONLY CRITICAL RESULT CALLED TO, READ BACK BY AND VERIFIED WITH: PHARMD MICHELLE LILLISTON ON 06/04/23 @ 2233 BY  DRT  Performed at [facility], [address], [place], [place] [address]    Culture GRAM POSITIVE COCCI IN CLUSTERS  Final   Report Status PENDING  Incomplete  Blood Culture ID Panel (Reflexed)     Status: Abnormal   Collection Time: 06/03/23 11:53 PM  Result Value Ref Range Status   Enterococcus faecalis NOT DETECTED NOT DETECTED Final   Enterococcus Faecium NOT DETECTED NOT DETECTED Final   Listeria monocytogenes NOT DETECTED NOT DETECTED Final   Staphylococcus species DETECTED (A) NOT DETECTED Final    Comment: CRITICAL RESULT CALLED TO, READ BACK BY AND VERIFIED WITH: PHARMD MICHELLE LILLISTON ON 06/04/23 @  2233 BY DRT     Staphylococcus aureus (BCID) NOT DETECTED NOT DETECTED Final   Staphylococcus epidermidis NOT DETECTED NOT DETECTED Final   Staphylococcus lugdunensis NOT DETECTED NOT DETECTED Final   Streptococcus species NOT DETECTED NOT DETECTED Final   Streptococcus agalactiae NOT DETECTED NOT DETECTED Final   Streptococcus pneumoniae NOT DETECTED NOT DETECTED Final   Streptococcus pyogenes NOT DETECTED NOT DETECTED Final   A.calcoaceticus-baumannii NOT DETECTED NOT DETECTED Final   Bacteroides fragilis NOT DETECTED NOT DETECTED Final   Enterobacterales NOT DETECTED NOT DETECTED Final   Enterobacter cloacae complex NOT DETECTED NOT DETECTED Final   Escherichia coli NOT DETECTED NOT DETECTED Final   Klebsiella aerogenes NOT DETECTED NOT DETECTED Final   Klebsiella oxytoca NOT DETECTED NOT DETECTED Final   Klebsiella pneumoniae NOT DETECTED NOT DETECTED Final   Proteus species NOT DETECTED NOT DETECTED Final   Salmonella species NOT DETECTED NOT DETECTED Final   Serratia marcescens NOT DETECTED NOT DETECTED Final   Haemophilus influenzae NOT DETECTED NOT DETECTED Final   Neisseria meningitidis NOT DETECTED NOT DETECTED Final   Pseudomonas aeruginosa NOT DETECTED NOT DETECTED Final   Stenotrophomonas maltophilia NOT DETECTED NOT DETECTED Final   Candida albicans  NOT DETECTED NOT DETECTED Final   Candida auris NOT DETECTED NOT DETECTED Final   Candida glabrata NOT DETECTED NOT DETECTED Final   Candida krusei NOT DETECTED NOT DETECTED Final   Candida parapsilosis NOT DETECTED NOT DETECTED Final   Candida tropicalis NOT DETECTED NOT DETECTED Final   Cryptococcus neoformans/gattii NOT DETECTED NOT DETECTED Final    Comment: Performed at [facility], [address], [place], [place] [address]  Blood culture (routine x 2)     Status: None (Preliminary result)   Collection Time: 06/04/23 12:00 AM   Specimen: BLOOD LEFT HAND  Result Value Ref Range Status   Specimen Description   Final    BLOOD LEFT HAND Performed at [facility], [address] [place], [place] [address]    Special Requests   Final    BOTTLES DRAWN AEROBIC AND ANAEROBIC Blood Culture results may not be optimal due to an excessive volume of blood received in culture bottles Performed at [facility], [address] [place], [place] [address]    Culture   Final    NO GROWTH < 12 HOURS Performed at [facility], [address], [place], [place] [address]    Report Status PENDING  Incomplete  MRSA Next Gen by PCR, Nasal     Status: None   Collection Time: 06/04/23  2:12 AM   Specimen: Nasal Mucosa; Nasal Swab  Result Value Ref Range Status   MRSA by PCR Next Gen NOT DETECTED NOT DETECTED Final    Comment: (NOTE) The GeneXpert MRSA Assay (FDA approved for NASAL specimens only), is one component of a comprehensive MRSA colonization surveillance program. It is not intended to diagnose MRSA infection nor to guide or monitor treatment for MRSA infections. Test performance is not FDA approved in patients less than 62 years old. Performed at [facility], [address], [place], [place] [address]       Radiology Studies: DG UGI W SINGLE CM (SOL OR THIN BA)  Result Date: 06/04/2023 CLINICAL DATA:  History of  gastric bypass and right-sided necrotizing pneumonia. Rule out esophageal leak. EXAM: WATER SOLUBLE UPPER GI SERIES TECHNIQUE: Single-column upper GI series was performed using water soluble contrast. Radiation Exposure Index (as provided by  the fluoroscopic device): 14.5 mGy Kerma CONTRAST:  50 cc of Omnipaque 300 COMPARISON:  Yesterday's chest abdomen and pelvic CTs FINDINGS: Patient was unable to tolerate oblique or prone imaging. Single-contrast focused exam was performed with the patient supine. Preprocedure scout film is unremarkable. Left proximal femur fixation. Degenerative changes of the right hip are significantly age advanced. 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 Normal esophageal caliber. No significant hiatal hernia. Contrast stasis throughout the esophagus suggests a component of dysmotility. No contrast extravasation to suggest esophageal perforation. IMPRESSION: No evidence of esophageal perforation or significant hiatal hernia. Limited single-contrast exam performed with the patient supine secondary to immobility as detailed above. Electronically Signed   By: Jeronimo Greaves M.D.   On: 06/04/2023 15:46   CT ABDOMEN PELVIS W CONTRAST  Result Date: 06/03/2023 CLINICAL DATA:  Acute abdominal pain. EXAM: CT ABDOMEN AND PELVIS WITH CONTRAST TECHNIQUE: Multidetector CT imaging of the abdomen and pelvis was performed using the standard protocol following bolus administration of intravenous contrast. RADIATION DOSE REDUCTION: This exam was performed according to the departmental dose-optimization program which includes automated exposure control, adjustment of the mA and/or kV according to patient size and/or use of iterative reconstruction technique. CONTRAST:  OMNIPAQUE IOHEXOL 350 MG/ML SOLN COMPARISON:  CT 02/11/2023 FINDINGS: Lower chest: Assessed on concurrent chest CT, reported separately. Hepatobiliary: Advanced hepatic steatosis. More focal fatty infiltration adjacent to  the falciform ligament. The liver is enlarged spanning 19.6 cm cranial caudal. Clips in the gallbladder fossa postcholecystectomy. No biliary dilatation. Pancreas: Unremarkable. No pancreatic ductal dilatation or surrounding inflammatory changes. Spleen: Normal in size without focal abnormality. Adrenals/Urinary Tract: No adrenal nodule. No hydronephrosis, renal inflammation or focal renal abnormality. Moderately distended bladder, normal for degree of distension. 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 The excluded gastric remnant is minimally distended with fluid. The Roux limb is nondilated. The jejunal anastomosis is unremarkable. There is no bowel obstruction or inflammatory change. Small volume of stool in the colon. Normal appendix visualized. Vascular/Lymphatic: No acute vascular findings. Normal caliber abdominal aorta. Patent portal vein. No abdominopelvic adenopathy. Reproductive: Uterus and bilateral adnexa are unremarkable. Other: No free air, free fluid, or intra-abdominal fluid collection. Musculoskeletal: There are no acute or suspicious osseous abnormalities. Surgical screw traverses the left intertrochanteric femur. Chronic changes of the bilateral hips. IMPRESSION: 1. No acute abnormality in the abdomen/pelvis. 2. Hepatomegaly and advanced hepatic steatosis. 3. Bariatric surgery with gastric suture line crossing the diaphragmatic hiatus and small hiatal hernia. Electronically Signed   By: Narda Rutherford M.D.   On: 06/03/2023 22:52   CT Angio Chest PE W and/or Wo Contrast  Result Date: 06/03/2023 CLINICAL DATA:  Pulmonary embolism (PE) suspected, high prob Right-sided pain. EXAM: CT ANGIOGRAPHY CHEST WITH CONTRAST TECHNIQUE: Multidetector CT imaging of the chest was performed using the standard protocol during bolus administration of intravenous contrast. Multiplanar CT image reconstructions and MIPs were obtained  to evaluate the vascular anatomy. RADIATION DOSE REDUCTION: This exam was performed according to the departmental dose-optimization program which includes automated exposure control, adjustment of the mA and/or kV according to patient size and/or use of iterative reconstruction technique. CONTRAST:  OMNIPAQUE IOHEXOL 350 MG/ML SOLN COMPARISON:  Chest radiograph earlier today.  Chest CT 10/16/2020 FINDINGS: Cardiovascular: There are no filling defects within the pulmonary arteries to suggest pulmonary embolus. Evaluation is diagnostic to the segmental level. The subsegmental branches are not well assessed due to phase of contrast. Mild  dilatation of the main pulmonary artery at 3 cm. Thoracic aorta is normal in caliber. Common origin of the brachiocephalic and left common carotid artery, variant anatomy. The heart is upper normal in size. No pericardial effusion. Mediastinum/Nodes: Prominent right hilar node at 10 mm. No mediastinal adenopathy. There is a small hiatal hernia. Bariatric surgery with gastric suture line crossing the diaphragmatic hiatus. Lungs/Pleura: Elongated cavitary process in the periphery of the right upper and right lower lobe. Above the major fissure there is a peripheral consolidative opacity with surrounding ground-glass and central focus of air. This extends inferiorly in the subpleural space into the right lower lobe where there is a sub pulmonic air-fluid collection measuring 4.5 x 1.9 x 7 cm, series 12, image 69 there is also a small dependent right pleural effusion. The left lung is clear. There are no features of pulmonary edema. Trachea and central airways are clear. Upper Abdomen: Assessed on concurrent abdominopelvic CT, reported separately. Musculoskeletal: There are no acute or suspicious osseous abnormalities. No rib fracture, destructive or erosive changes, particularly of right ribs. Review of the MIP images confirms the above findings. IMPRESSION: 1. No pulmonary embolus.  2. Elongated cavitary process in the periphery of the right upper and right lower lobe. Right upper lobe peripheral consolidative opacity with surrounding ground-glass and central focus of air. This extends inferiorly in the subpleural space into the right lower lobe where there is a sub pulmonic air-fluid collection measuring 4.5 x 1.9 x 7 cm. There is also a small dependent right pleural effusion. Findings are most consistent with necrotizing pneumonia. Recommend follow-up imaging to resolution. 3. Prominent right hilar node at 10 mm, likely reactive. 4. Small hiatal hernia. Bariatric surgery with gastric suture line crossing the diaphragmatic hiatus. 5. Mild dilatation of the main pulmonary artery, can be seen with pulmonary arterial hypertension. Electronically Signed   By: Narda Rutherford M.D.   On: 06/03/2023 22:49   DG Chest 2 View  Result Date: 06/03/2023 CLINICAL DATA:  Chest pain. EXAM: CHEST - 2 VIEW COMPARISON:  02/11/2023. FINDINGS: Bilateral lung fields are clear. There is trace right pleural effusion, new since the prior study. Left lateral costophrenic angle is clear. No pneumothorax on either side. Normal cardio-mediastinal silhouette. No acute osseous abnormalities. The soft tissues are within normal limits. IMPRESSION: *New trace right pleural effusion. Electronically Signed   By: Jules Schick M.D.   On: 06/03/2023 15:12       LOS: 1 day   Marie Myers  [facility] Pager on [email or web address]  06/05/2023, 9:54 AM

## 2023-06-06 DIAGNOSIS — J850 Gangrene and necrosis of lung: Secondary | ICD-10-CM | POA: Diagnosis not present

## 2023-06-06 DIAGNOSIS — D696 Thrombocytopenia, unspecified: Secondary | ICD-10-CM | POA: Insufficient documentation

## 2023-06-06 DIAGNOSIS — G40909 Epilepsy, unspecified, not intractable, without status epilepticus: Secondary | ICD-10-CM | POA: Diagnosis not present

## 2023-06-06 DIAGNOSIS — Z789 Other specified health status: Secondary | ICD-10-CM | POA: Diagnosis not present

## 2023-06-06 DIAGNOSIS — E538 Deficiency of other specified B group vitamins: Secondary | ICD-10-CM | POA: Insufficient documentation

## 2023-06-06 LAB — BASIC METABOLIC PANEL
Anion gap: 10 (ref 5–15)
BUN: <5 mg/dL — ABNORMAL LOW (ref 6–20)
CO2: 24 mmol/L (ref 22–32)
Calcium: 7.8 mg/dL — ABNORMAL LOW (ref 8.9–10.3)
Chloride: 101 mmol/L (ref 98–111)
Creatinine, Ser: 0.47 mg/dL (ref 0.44–1.00)
GFR, Estimated: >60 mL/min (ref >60)
Glucose, Bld: 73 mg/dL (ref 70–99)
Potassium: 4.1 mmol/L (ref 3.5–5.1)
Sodium: 135 mmol/L (ref 135–145)

## 2023-06-06 LAB — RETICULOCYTES
Immature Retic Fract: 9 % (ref 2.3–15.9)
RBC.: 2.58 MIL/uL — ABNORMAL LOW (ref 3.87–5.11)
Retic Count, Absolute: 30.4 10*3/uL (ref 19.0–186.0)
Retic Ct Pct: 1.2 % (ref 0.4–3.1)

## 2023-06-06 LAB — CBC
HCT: 28 % — ABNORMAL LOW (ref 36.0–46.0)
Hemoglobin: 9.2 g/dL — ABNORMAL LOW (ref 12.0–15.0)
MCH: 36.7 pg — ABNORMAL HIGH (ref 26.0–34.0)
MCHC: 32.9 g/dL (ref 30.0–36.0)
MCV: 111.6 fL — ABNORMAL HIGH (ref 80.0–100.0)
Platelets: 96 10*3/uL — ABNORMAL LOW (ref 150–400)
RBC: 2.51 MIL/uL — ABNORMAL LOW (ref 3.87–5.11)
RDW: 17.3 % — ABNORMAL HIGH (ref 11.5–15.5)
WBC: 8.7 10*3/uL (ref 4.0–10.5)
nRBC: 0 % (ref 0.0–0.2)

## 2023-06-06 LAB — IRON AND TIBC
Iron: 18 ug/dL — ABNORMAL LOW (ref 28–170)
Saturation Ratios: 9 % — ABNORMAL LOW (ref 10.4–31.8)
TIBC: 197 ug/dL — ABNORMAL LOW (ref 250–450)
UIBC: 179 ug/dL

## 2023-06-06 LAB — CULTURE, BLOOD (ROUTINE X 2): Special Requests: ADEQUATE

## 2023-06-06 LAB — MAGNESIUM: Magnesium: 2.3 mg/dL (ref 1.7–2.4)

## 2023-06-06 LAB — FERRITIN: Ferritin: 230 ng/mL (ref 11–307)

## 2023-06-06 LAB — VITAMIN B12: Vitamin B-12: 217 pg/mL (ref 180–914)

## 2023-06-06 LAB — FOLATE: Folate: 4.7 ng/mL — ABNORMAL LOW (ref >5.9)

## 2023-06-06 MED ORDER — ONDANSETRON 4 MG PO TBDP
4.0000 mg | ORAL_TABLET | Freq: Three times a day (TID) | ORAL | Status: DC | PRN
  Administered 2023-06-06: 4 mg via ORAL
  Filled 2023-06-06: qty 1

## 2023-06-06 MED ORDER — VITAMIN B-12 1000 MCG PO TABS
500.0000 ug | ORAL_TABLET | Freq: Every day | ORAL | Status: DC
  Administered 2023-06-06 – 2023-06-08 (×3): 500 ug via ORAL
  Filled 2023-06-06 (×3): qty 1

## 2023-06-06 MED ORDER — LORAZEPAM 0.5 MG PO TABS
0.5000 mg | ORAL_TABLET | Freq: Four times a day (QID) | ORAL | Status: DC | PRN
  Administered 2023-06-06: 0.5 mg via ORAL
  Filled 2023-06-06: qty 1

## 2023-06-06 MED ORDER — SERTRALINE HCL 25 MG PO TABS
25.0000 mg | ORAL_TABLET | Freq: Every day | ORAL | Status: DC
  Administered 2023-06-06 – 2023-06-07 (×2): 25 mg via ORAL
  Filled 2023-06-06 (×2): qty 1

## 2023-06-06 NOTE — Hospital Course (Addendum)
32 year old woman PMH including daily alcohol use, seizure disorder, depression, anxiety, presented with right-sided chest pain, nausea and vomiting.  Admitted for necrotizing pneumonia.  Seen by pulmonology and general surgery.  Pulmonology recommended long-term antibiotics and outpatient follow-up.  Seen by general surgery for abnormality on CT scan from previous bariatric surgery.  Plan for outpatient follow-up.  Consultants Pulmonology General surgery   Procedures None

## 2023-06-06 NOTE — Progress Notes (Addendum)
Progress Note   Patient: Marie Myers WUJ:[id number] DOB: Sep 17, 1990 DOA: 06/03/2023     2 DOS: the patient was seen and examined on 06/06/2023   Brief hospital course: 32 year old woman PMH including daily alcohol use, seizure disorder, depression, anxiety, presented with right-sided chest pain, nausea and vomiting.  Admitted for necrotizing pneumonia.  Seen by pulmonology and general surgery.  Pulmonology recommended long-term antibiotics and outpatient follow-up.  Seen by general surgery for abnormality on CT scan from previous bariatric surgery.  Plan for outpatient follow-up.  Consultants Pulmonology General surgery   Procedures None  Assessment and Plan: Necrotizing pneumonia/lung abscess In the setting of recurrent nausea and vomiting as well as possible seizure episodes.  Suspected to have aspirated multiple times.   Seen by pulmonology, treating with Unasyn.  Will need at least 4 to 6 weeks of Augmentin and repeat CT chest in 6 to 8 weeks to assess for clearing.  Continue antibiotics until that time.   If lesions persist may need to consider bronchoscopy  Consider outpatient dental evaluation and possible extraction  Follow-up quantiferon gold in the setting of recurrent episodes of nausea and vomiting as well as seizure episodes.  She likely aspirated from her episodes of emesis.  Do not suspect any oropharyngeal etiology at this time.  Nausea and vomiting in the setting of previous bariatric surgery CT scan showed the suture line crossing the diaphragm.  General surgery was consulted.  Upper GI series was done which did not show any leakage or perforation.  Surgery followed.  Advance as per surgery. Continue PPI. Consider bariatric coordinator see this patient while she is an inpatient to make sure she has prior diet education, and has a plan for outpatient follow-up.  She can see [facility] bariatric surgeons to determine if she needs anything addressed with her hiatal hernia or  other further interventions. Nothing emergent.    Macrocytic anemia Thrombocytopenia Folate deficiency  B-12 low but WNL Drop in hemoglobin may be dilutional.  Check CBC in AM. Significance of thrombocytopenia unclear.  Check CBC in AM. Start folate supplementation  Seizure disorder Appears stable.  Continue Keppra.   Alcohol use disorder Marijuana use No signs or symptoms of withdrawal. Recommend abstinence  Depression Anxiety Insomnia Recently seen by psychiatry, trazodone was increased to 100 mg nightly.  Patient stopped Zoloft.  Abilify started.  Advanced hepatic steatosis Hepatomegaly Follow-up as an outpatient.  Positive blood culture -- contaminant   Resolved Hypokalemia   Hyponatremia      Subjective:  Has significant chest pain from pneumonia. Has anxiety at times which seems to magnify things.  Physical Exam: Vitals:   06/05/23 1903 06/05/23 2347 06/06/23 0539 06/06/23 1255  BP: 129/83 117/71 (!) 137/96 131/89  Pulse: 98 81 85 92  Resp: 17 16 18 16   Temp: 99 F (37.2 C) 99 F (37.2 C) 99 F (37.2 C) 98.5 F (36.9 C)  TempSrc: Oral Oral  Oral  SpO2: 97% 95% 97% 100%  Weight:      Height:       Physical Exam Vitals reviewed.  Constitutional:      General: She is not in acute distress.    Appearance: She is not ill-appearing or toxic-appearing.  Cardiovascular:     Rate and Rhythm: Normal rate and regular rhythm.     Heart sounds: No murmur heard. Pulmonary:     Effort: Pulmonary effort is normal. No respiratory distress.     Breath sounds: No wheezing, rhonchi or rales.  Neurological:  Mental Status: She is alert.  Psychiatric:        Attention and Perception: Attention and perception normal.        Mood and Affect: Mood is anxious.        Speech: Speech normal.        Behavior: Behavior normal.        Thought Content: Thought content normal.        Cognition and Memory: Cognition normal.     Data Reviewed: Basic metabolic  panel unremarkable Folate 4.7 Vitamin B12 217  Family Communication: aunt by telephone  Disposition: Status is: Inpatient Remains inpatient appropriate because: necrotizing pneumonia     Time spent: 35 minutes  Author: Brendia Sacks, MD 06/06/2023 2:35 PM  For on call review [email or web address].

## 2023-06-06 NOTE — Progress Notes (Signed)
Necrotizing pneumonia (HCC)  Subjective: \Tolerating fulls  Objective: Vital signs in last 24 hours: Temp:  [99 F (37.2 C)] 99 F (37.2 C) (10/13 0539) Pulse Rate:  [81-98] 85 (10/13 0539) Resp:  [16-18] 18 (10/13 0539) BP: (117-137)/(71-96) 137/96 (10/13 0539) SpO2:  [92 %-97 %] 97 % (10/13 0539) Last BM Date : 06/01/23  Intake/Output from previous day: 10/12 0701 - 10/13 0700 In: 1290.9 [P.O.:840; I.V.:50.9; IV Piggyback:400] Out: -  Intake/Output this shift: No intake/output data recorded.  General appearance: alert and cooperative GI: soft  Lab Results:  Results for orders placed or performed during the hospital encounter of 06/03/23 (from the past 24 hour(s))  Basic metabolic panel     Status: Abnormal   Collection Time: 06/06/23  3:43 AM  Result Value Ref Range   Sodium 135 135 - 145 mmol/L   Potassium 4.1 3.5 - 5.1 mmol/L   Chloride 101 98 - 111 mmol/L   CO2 24 22 - 32 mmol/L   Glucose, Bld 73 70 - 99 mg/dL   BUN <5 (L) 6 - 20 mg/dL   Creatinine, Ser 2.95 0.44 - 1.00 mg/dL   Calcium 7.8 (L) 8.9 - 10.3 mg/dL   GFR, Estimated >28 >41 mL/min   Anion gap 10 5 - 15  Vitamin B12     Status: None   Collection Time: 06/06/23  3:43 AM  Result Value Ref Range   Vitamin B-12 217 180 - 914 pg/mL  Folate     Status: Abnormal   Collection Time: 06/06/23  3:43 AM  Result Value Ref Range   Folate 4.7 (L) >5.9 ng/mL  Iron and TIBC     Status: Abnormal   Collection Time: 06/06/23  3:43 AM  Result Value Ref Range   Iron 18 (L) 28 - 170 ug/dL   TIBC 324 (L) 401 - 027 ug/dL   Saturation Ratios 9 (L) 10.4 - 31.8 %   UIBC 179 ug/dL  Ferritin     Status: None   Collection Time: 06/06/23  3:43 AM  Result Value Ref Range   Ferritin 230 11 - 307 ng/mL  Reticulocytes     Status: Abnormal   Collection Time: 06/06/23  3:43 AM  Result Value Ref Range   Retic Ct Pct 1.2 0.4 - 3.1 %   RBC. 2.58 (L) 3.87 - 5.11 MIL/uL   Retic Count, Absolute 30.4 19.0 - 186.0 K/uL   Immature  Retic Fract 9.0 2.3 - 15.9 %  Magnesium     Status: None   Collection Time: 06/06/23  3:43 AM  Result Value Ref Range   Magnesium 2.3 1.7 - 2.4 mg/dL  CBC     Status: Abnormal   Collection Time: 06/06/23  5:36 AM  Result Value Ref Range   WBC 8.7 4.0 - 10.5 K/uL   RBC 2.51 (L) 3.87 - 5.11 MIL/uL   Hemoglobin 9.2 (L) 12.0 - 15.0 g/dL   HCT 25.3 (L) 66.4 - 40.3 %   MCV 111.6 (H) 80.0 - 100.0 fL   MCH 36.7 (H) 26.0 - 34.0 pg   MCHC 32.9 30.0 - 36.0 g/dL   RDW 47.4 (H) 25.9 - 56.3 %   Platelets 96 (L) 150 - 400 K/uL   nRBC 0.0 0.0 - 0.2 %     Studies/Results Radiology     MEDS, Scheduled  ARIPiprazole  5 mg Oral Daily   diphenhydrAMINE  25 mg Oral Once   enoxaparin (LOVENOX) injection  40 mg Subcutaneous Q24H  folic acid  1 mg Oral Daily   levETIRAcetam  500 mg Oral BID   multivitamin with minerals  1 tablet Oral Daily   nicotine  14 mg Transdermal Daily   pantoprazole (PROTONIX) IV  40 mg Intravenous Q12H   polyethylene glycol  17 g Oral BID   thiamine  100 mg Oral Daily   Or   thiamine  100 mg Intravenous Daily   traZODone  200 mg Oral QHS     Assessment: Necrotizing pneumonia (HCC) Hiatal hernia: stable  Plan: Cont fulls today.  If tolerates this, can switch to solid diet tom   LOS: 2 days    Vanita Panda, MD [facility]  Patient's medical decision making was straightforward (25 mins met or exceeded with patient care and documentation).   06/06/2023 9:30 AM

## 2023-06-06 NOTE — TOC Initial Note (Signed)
Transition of Care [facility]) - Initial/Assessment Note    Patient Details  Name: Marie Myers MRN: [id number] Date of Birth: 06-07-1991  [facility]) CM/SW Contact:    Otelia Santee, LCSW Phone Number: 06/06/2023, 1:49 PM  Clinical Narrative:                 Spoke with pt via t/c to room. Pt shares she has been trying to cut back on her alcohol intake however, has been struggling to do so. Pt is agreeable to resources being placed on discharge instructions for substance use rehab and opt counseling. Pt interested in medication assistance for her recovery. CSW encouraged pt to reach out to her PCP and/or psychiatrist to discuss this option with them.   Expected Discharge Plan: Home/Self Care Barriers to Discharge: No Barriers Identified   Patient Goals and CMS Choice Patient states their goals for this hospitalization and ongoing recovery are:: To return home CMS Medicare.gov Compare Post Acute Care list provided to::  (NA) Choice offered to / list presented to :  (NA) [place] ownership interest in [facility].provided to::  (NA)    Expected Discharge Plan and Services In-house Referral: Clinical Social Work Discharge Planning Services: NA Post Acute Care Choice: NA Living arrangements for the past 2 months: Single Family Home                 DME Arranged: N/A DME Agency: NA                  Prior Living Arrangements/Services Living arrangements for the past 2 months: Single Family Home Lives with:: Significant Other Patient language and need for interpreter reviewed:: Yes Do you feel safe going back to the place where you live?: Yes      Need for Family Participation in Patient Care: No (Comment) Care giver support system in place?: No (comment)   Criminal Activity/Legal Involvement Pertinent to Current Situation/Hospitalization: No - Comment as needed  Activities of Daily Living   ADL Screening (condition at time of  admission) Independently performs ADLs?: Yes (appropriate for developmental age) Is the patient deaf or have difficulty hearing?: No Does the patient have difficulty seeing, even when wearing glasses/contacts?: No Does the patient have difficulty concentrating, remembering, or making decisions?: No  Permission Sought/Granted   Permission granted to share information with : No              Emotional Assessment   Attitude/Demeanor/Rapport: Engaged Affect (typically observed): Accepting Orientation: : Oriented to Self, Oriented to Place, Oriented to  Time, Oriented to Situation Alcohol / Substance Use: Illicit Drugs, Alcohol Use Psych Involvement: No (comment)  Admission diagnosis:  Necrotizing pneumonia (HCC) [J85.0] Patient Active Problem List   Diagnosis Date Noted   Necrotizing pneumonia (HCC) 06/04/2023   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/01/2023   Seizures (HCC) 10/03/2022   Anxiety 10/03/2022   Insomnia 10/03/2022   Leukocytosis 10/03/2022   Seizure-like activity (HCC) 08/23/2022   Alcohol use 08/23/2022   Seizure disorder (HCC) 08/22/2022   MDD (major depressive disorder), recurrent episode, severe (HCC) 01/10/2022   Alcohol use disorder, severe, dependence (HCC) 01/08/2021   Marijuana use 01/08/2021   MDD (major depressive disorder), recurrent severe, without psychosis (HCC) 01/07/2021   Metabolic acidemia 10/21/2020   Tetrahydrocannabinol (THC) dependence (HCC) 10/16/2020   Acute lower UTI 10/16/2020   Tachypnea 10/16/2020   Dehydration 10/16/2020   Hypertensive urgency 10/16/2020   Intractable nausea and vomiting 10/16/2020  Abdominal pain 08/11/2019   PCP:  Delfino Lovett, FNP Pharmacy:   [phone] - [place], [place] - [address] [place] [address] Phone: [phone] Fax: [phone]     Social Determinants of Health (SDOH) Social History: SDOH Screenings   Food Insecurity: No Food  Insecurity (06/04/2023)  Housing: Low Risk  (06/04/2023)  Transportation Needs: No Transportation Needs (06/04/2023)  Utilities: Not At Risk (06/04/2023)  Alcohol Screen: High Risk (06/01/2023)  Depression (PHQ2-9): High Risk (06/01/2023)  Social Connections: Unknown (11/19/2022)   [facility] from [facility]  Tobacco Use: Low Risk  (06/03/2023)   SDOH Interventions:     Readmission Risk Interventions    06/06/2023    1:47 PM 10/23/2020   10:50 AM  Readmission Risk Prevention Plan  Transportation Screening Complete Complete  PCP or Specialist Appt within 5-7 Days Complete   Home Care Screening Complete   Medication Review (RN CM) Complete   Medication Review (RN Care Manager)  Complete  PCP or Specialist appointment within 3-5 days of discharge  Complete  HRI or Home Care Consult  Complete  SW Recovery Care/Counseling Consult  Not Complete  Palliative Care Screening  Not Applicable  Skilled Nursing Facility  Not Applicable

## 2023-06-06 NOTE — Plan of Care (Signed)
Problem: Coping: Goal: Level of anxiety will decrease Outcome: Progressing   Problem: Pain Managment: Goal: General experience of comfort will improve Outcome: Progressing

## 2023-06-07 DIAGNOSIS — J850 Gangrene and necrosis of lung: Secondary | ICD-10-CM | POA: Diagnosis not present

## 2023-06-07 DIAGNOSIS — D539 Nutritional anemia, unspecified: Secondary | ICD-10-CM | POA: Insufficient documentation

## 2023-06-07 DIAGNOSIS — G40909 Epilepsy, unspecified, not intractable, without status epilepticus: Secondary | ICD-10-CM | POA: Diagnosis not present

## 2023-06-07 DIAGNOSIS — E538 Deficiency of other specified B group vitamins: Secondary | ICD-10-CM

## 2023-06-07 DIAGNOSIS — D696 Thrombocytopenia, unspecified: Secondary | ICD-10-CM

## 2023-06-07 LAB — CBC
HCT: 27.5 % — ABNORMAL LOW (ref 36.0–46.0)
Hemoglobin: 9.1 g/dL — ABNORMAL LOW (ref 12.0–15.0)
MCH: 36.7 pg — ABNORMAL HIGH (ref 26.0–34.0)
MCHC: 33.1 g/dL (ref 30.0–36.0)
MCV: 110.9 fL — ABNORMAL HIGH (ref 80.0–100.0)
Platelets: 119 10*3/uL — ABNORMAL LOW (ref 150–400)
RBC: 2.48 MIL/uL — ABNORMAL LOW (ref 3.87–5.11)
RDW: 17 % — ABNORMAL HIGH (ref 11.5–15.5)
WBC: 9.7 10*3/uL (ref 4.0–10.5)
nRBC: 0 % (ref 0.0–0.2)

## 2023-06-07 LAB — BASIC METABOLIC PANEL
Anion gap: 10 (ref 5–15)
BUN: <5 mg/dL — ABNORMAL LOW (ref 6–20)
CO2: 23 mmol/L (ref 22–32)
Calcium: 8 mg/dL — ABNORMAL LOW (ref 8.9–10.3)
Chloride: 103 mmol/L (ref 98–111)
Creatinine, Ser: 0.44 mg/dL (ref 0.44–1.00)
GFR, Estimated: >60 mL/min (ref >60)
Glucose, Bld: 81 mg/dL (ref 70–99)
Potassium: 3.5 mmol/L (ref 3.5–5.1)
Sodium: 136 mmol/L (ref 135–145)

## 2023-06-07 LAB — TSH: TSH: 2.288 u[IU]/mL (ref 0.350–4.500)

## 2023-06-07 MED ORDER — PANTOPRAZOLE SODIUM 40 MG PO TBEC
40.0000 mg | DELAYED_RELEASE_TABLET | Freq: Two times a day (BID) | ORAL | Status: DC
  Administered 2023-06-07 – 2023-06-08 (×3): 40 mg via ORAL
  Filled 2023-06-07 (×3): qty 1

## 2023-06-07 NOTE — Progress Notes (Signed)
Name: Marie Myers                 Patient MRN: [id number]  DOA: 06/03/2023  Patient seen in room 1341  Patient stated that she had a hard time with her post-operative care following her Gastric Bypass surgery that was completed at [facility] in 2018. Patient currently sitting in the bed and eating her meal tray. Patient was asked if the smaller meal portions or other factors contributed to her weight loss. Patient stated that most of her post-op complications such as nausea and vomiting attributed to her significant decrease in weight, and she wished she had not gone that route for weight loss.   O:Patient seen following consultation request from attending physician.  Asked patient if she would be interested in receiving an overview of [facility] bariatric surgery post-op education suited for gastric bypass patients. Patient was receptive.   Patient working towards better pain control. Patient is tolerating fluids. Reviewed Gastric sleeve/bypass discharge educational instructions with patient and patient is able to articulate understanding. Provided information on [place] bariatric support group, BSTOP-D (a MBSAQIP initiative), and supplements available at the [facility] outpatient pharmacy. Communicated general update of patient encounter to attending physician.  Latest vitals:    06/07/2023    6:03 AM 06/06/2023   11:57 PM 06/06/2023   12:55 PM  Vitals with BMI  Systolic 105 101 409  Diastolic 75 65 89  Pulse 85 81 92     Intake/Output:   Intake/Output Summary (Last 24 hours) at 06/07/2023 1631 Last data filed at 06/07/2023 1400 Gross per 24 hour  Intake 1480.06 ml  Output --  Net 1480.06 ml    24 hr fluid recall - PO intake: 1080 mL   Labs: CA 8.0        (10/14 @ 03:40) Iron 18        (10/13 @ 03:43) Folate 4.7   (10/13 @ 03:43) WBC 9.7     (10/14 @ 03:40) HGB 9.1     (10/14 @ 03:40) Platelets 119  (10/14 @ 03:40)   Assessment / Plan Recommendations:    Nutrition: Reviewed education to aim for 60g of protein and at least 64 oz of fluid daily (fluids can be clear or full). The bariatric surgical post-op patients are usually provided with Ensure Max Protein Shakes scheduled on the [facility] to meet the daily nutritional protein requirements that bariatric patients need. Looks like the patient already has a multivitamin and vitamin supplements onboard. Would recommend a consultation with the in-patient dietician to further review the patient needs and provide suggestions.   Education: Due to the patient having a RNY- reviewed bariatric discharge education with the patient. Assessed for any gaps in prior bariatric surgery Edu. While reviewing the education, the patient stated that she was familiar with a lot of the content provided; however, some answers she gave when asked during our time did identify the benefit for re-education during this visit and recommendation to connect with [facility]ariat[facility] Surgeons following discharge.  Psych: Due to the extensive displeasure the patient expressed with having had the bariatric surgery in 2018 in [place] state and current disconnect from bariatric surgeon follow-up would recommend a pych/behavioral health consult. Some patients may divert to unhealthy choices such as alcohol abuse following bariatric surgery. A consultation may the patient help with identifying triggers (if any) and addressing socio-emotional needs of the patient to work towards healthier coping mechanisms post-RNY.  Bariatric Surgical Follow-up: I have reached out  to [facility]ariatric Navigators to follow-up with the patient. Per the [facility] navigators, the patient's insurance was reviewed. Due to the patient having medicaid, unless she has another insurance provider, she would be paying out of pocket for a visit with the [facility] bariatric surgeon's office. The [facility]ariat[facility] Navigators recommend that the patient reach out to them. For further follow-up contact number  [phone] can be reached. [facility] contact information was provided to the patient via handout and added to the AVS.   At this time, no further in-patient follow-up from the bariatric nurse coordinator is identified. Feel free to reach out if needed. I appreciate the opportunity to consult in this patient care.     Thank you,  Lubertha Basque, RN, MSN Bariatric Nurse Coordinator [phone] (office)

## 2023-06-07 NOTE — Plan of Care (Signed)
Problem: Coping: Goal: Level of anxiety will decrease Outcome: Progressing   Problem: Pain Managment: Goal: General experience of comfort will improve Outcome: Progressing   Problem: Elimination: Goal: Will not experience complications related to bowel motility Outcome: Progressing Goal: Will not experience complications related to urinary retention Outcome: Progressing

## 2023-06-07 NOTE — Plan of Care (Signed)
Patient has progressed well throughout this shift with fewer episodes of pain, anxiety, and breathlessness. Tolerating meals well without N/V. Educated regarding tobacco/vape cessation and patient open to this education.  Problem: Education: Goal: Knowledge of General Education information will improve Description: Including pain rating scale, medication(s)/side effects and non-pharmacologic comfort measures Outcome: Progressing   Problem: Clinical Measurements: Goal: Ability to maintain clinical measurements within normal limits will improve Outcome: Progressing   Problem: Activity: Goal: Risk for activity intolerance will decrease Outcome: Progressing   Problem: Elimination: Goal: Will not experience complications related to bowel motility Outcome: Progressing   Problem: Safety: Goal: Ability to remain free from injury will improve Outcome: Progressing   Haydee Salter, RN 06/07/23 7:37 PM

## 2023-06-07 NOTE — Progress Notes (Signed)
Progress Note   Patient: Marie Myers ZOX:[id number] DOB: 02/02/1991 DOA: 06/03/2023     3 DOS: the patient was seen and examined on 06/07/2023   Brief hospital course: 32 year old woman PMH including daily alcohol use, seizure disorder, depression, anxiety, presented with right-sided chest pain, nausea and vomiting.  Admitted for necrotizing pneumonia.  Seen by pulmonology and general surgery.  Pulmonology recommended long-term antibiotics and outpatient follow-up.  Seen by general surgery for abnormality on CT scan from previous bariatric surgery.  Plan for outpatient follow-up.  Consultants Pulmonology General surgery   Procedures None  Assessment and Plan: Necrotizing pneumonia/lung abscess In the setting of recurrent nausea and vomiting as well as possible seizure episodes.  Suspected to have aspirated multiple times.   Seen by pulmonology, treating with Unasyn.  Will need at least 4 to 6 weeks of Augmentin and repeat CT chest in 6 to 8 weeks to assess for clearing.  Continue antibiotics until that time.   If lesions persist may need to consider bronchoscopy  Consider outpatient dental evaluation and possible extraction  Follow-up quantiferon gold in the setting of recurrent episodes of nausea and vomiting as well as seizure episodes.  She likely aspirated from her episodes of emesis.  Do not suspect any oropharyngeal etiology at this time.   Nausea and vomiting in the setting of previous bariatric surgery CT scan showed the suture line crossing the diaphragm.  General surgery was consulted.  Upper GI series was done which did not show any leakage or perforation.  Surgery followed.   Continue PPI. Consider bariatric coordinator see this patient while she is an inpatient to make sure she has prior diet education, and has a plan for outpatient follow-up.  She can see [facility] bariatric surgeons to determine if she needs anything addressed with her hiatal hernia or other further  interventions. Nothing emergent.    Macrocytic anemia Thrombocytopenia Folate deficiency  B-12 low but WNL Drop in hemoglobin dilutional. Hgb stable at 9.1 today Significance of thrombocytopenia unclear.  Plts up to 119 today Continue folate supplementation   Seizure disorder Appears stable.  Continue Keppra.   Alcohol use disorder Marijuana use No signs or symptoms of withdrawal. Recommend abstinence   Depression Anxiety Insomnia Recently seen by psychiatry, trazodone was increased to 100 mg nightly.  Patient stopped Zoloft.  Abilify started.   Advanced hepatic steatosis Hepatomegaly Follow-up as an outpatient.   Positive blood culture -- contaminant    Resolved Hypokalemia   Hyponatremia        Subjective:  Feels better today Has some pain Breathing ok Has been able to get up  Physical Exam: Vitals:   06/06/23 0539 06/06/23 1255 06/06/23 2357 06/07/23 0603  BP: (!) 137/96 131/89 101/65 105/75  Pulse: 85 92 81 85  Resp: 18 16 16 16   Temp: 99 F (37.2 C) 98.5 F (36.9 C) 98.8 F (37.1 C) 98.8 F (37.1 C)  TempSrc:  Oral Oral Oral  SpO2: 97% 100% 100% 96%  Weight:      Height:       Physical Exam Vitals reviewed.  Constitutional:      General: She is not in acute distress.    Appearance: She is not ill-appearing or toxic-appearing.  Cardiovascular:     Rate and Rhythm: Normal rate and regular rhythm.     Heart sounds: No murmur heard. Pulmonary:     Effort: Pulmonary effort is normal. No respiratory distress.     Breath sounds: No wheezing, rhonchi or rales.  Neurological:     Mental Status: She is alert.  Psychiatric:        Mood and Affect: Mood normal.        Behavior: Behavior normal.     Data Reviewed: BMP noted Hgb stable at 9.1 today Significance of thrombocytopenia unclear.  Plts up to 119 today TSH WNL  Family Communication: none  Disposition: Status is: Inpatient Remains inpatient appropriate because: necrotizing  pneumonia     Time spent: 20 minutes  Author: Brendia Sacks, MD 06/07/2023 2:50 PM  For on call review [email or web address].

## 2023-06-07 NOTE — Plan of Care (Signed)
  Problem: Education: Goal: Knowledge of General Education information will improve Description Including pain rating scale, medication(s)/side effects and non-pharmacologic comfort measures Outcome: Progressing   Problem: Health Behavior/Discharge Planning: Goal: Ability to manage health-related needs will improve Outcome: Progressing   

## 2023-06-08 ENCOUNTER — Telehealth (HOSPITAL_COMMUNITY): Payer: Self-pay | Admitting: *Deleted

## 2023-06-08 ENCOUNTER — Ambulatory Visit (HOSPITAL_COMMUNITY): Payer: Medicaid Other

## 2023-06-08 DIAGNOSIS — J850 Gangrene and necrosis of lung: Secondary | ICD-10-CM | POA: Diagnosis not present

## 2023-06-08 DIAGNOSIS — E538 Deficiency of other specified B group vitamins: Secondary | ICD-10-CM | POA: Diagnosis not present

## 2023-06-08 DIAGNOSIS — D696 Thrombocytopenia, unspecified: Secondary | ICD-10-CM | POA: Diagnosis not present

## 2023-06-08 LAB — CBC
HCT: 28.9 % — ABNORMAL LOW (ref 36.0–46.0)
Hemoglobin: 9.5 g/dL — ABNORMAL LOW (ref 12.0–15.0)
MCH: 37 pg — ABNORMAL HIGH (ref 26.0–34.0)
MCHC: 32.9 g/dL (ref 30.0–36.0)
MCV: 112.5 fL — ABNORMAL HIGH (ref 80.0–100.0)
Platelets: 140 10*3/uL — ABNORMAL LOW (ref 150–400)
RBC: 2.57 MIL/uL — ABNORMAL LOW (ref 3.87–5.11)
RDW: 17.3 % — ABNORMAL HIGH (ref 11.5–15.5)
WBC: 9.2 10*3/uL (ref 4.0–10.5)
nRBC: 0 % (ref 0.0–0.2)

## 2023-06-08 LAB — BASIC METABOLIC PANEL
Anion gap: 9 (ref 5–15)
BUN: <5 mg/dL — ABNORMAL LOW (ref 6–20)
CO2: 24 mmol/L (ref 22–32)
Calcium: 7.9 mg/dL — ABNORMAL LOW (ref 8.9–10.3)
Chloride: 102 mmol/L (ref 98–111)
Creatinine, Ser: 0.51 mg/dL (ref 0.44–1.00)
GFR, Estimated: >60 mL/min (ref >60)
Glucose, Bld: 83 mg/dL (ref 70–99)
Potassium: 3.4 mmol/L — ABNORMAL LOW (ref 3.5–5.1)
Sodium: 135 mmol/L (ref 135–145)

## 2023-06-08 MED ORDER — PANTOPRAZOLE SODIUM 40 MG PO TBEC: 40.0000 mg | DELAYED_RELEASE_TABLET | Freq: Every day | ORAL | 1 refills | Status: DC

## 2023-06-08 MED ORDER — POTASSIUM CHLORIDE CRYS ER 20 MEQ PO TBCR
40.0000 meq | EXTENDED_RELEASE_TABLET | Freq: Once | ORAL | Status: AC
  Administered 2023-06-08: 40 meq via ORAL
  Filled 2023-06-08: qty 2

## 2023-06-08 MED ORDER — CYANOCOBALAMIN 500 MCG PO TABS: 500.0000 ug | ORAL_TABLET | Freq: Every day | ORAL | Status: DC

## 2023-06-08 MED ORDER — FOLIC ACID 1 MG PO TABS: 1.0000 mg | ORAL_TABLET | Freq: Every day | ORAL | Status: DC

## 2023-06-08 MED ORDER — ACETAMINOPHEN 325 MG PO TABS: 650.0000 mg | ORAL_TABLET | Freq: Four times a day (QID) | ORAL | Status: AC | PRN

## 2023-06-08 MED ORDER — AMOXICILLIN-POT CLAVULANATE 875-125 MG PO TABS: 1.0000 | ORAL_TABLET | Freq: Two times a day (BID) | ORAL | 1 refills | Status: DC

## 2023-06-08 MED ORDER — ADULT MULTIVITAMIN W/MINERALS CH: 1.0000 | ORAL_TABLET | Freq: Every day | ORAL | Status: DC

## 2023-06-08 MED ORDER — VITAMIN B-1 100 MG PO TABS: 100.0000 mg | ORAL_TABLET | Freq: Every day | ORAL | Status: DC

## 2023-06-08 MED ORDER — OXYCODONE HCL 5 MG PO TABS: 5.0000 mg | ORAL_TABLET | Freq: Four times a day (QID) | ORAL | 0 refills | Status: DC | PRN

## 2023-06-08 NOTE — Progress Notes (Signed)
   06/08/23 1238  AVS Discharge Documentation  AVS Discharge Instructions Including Medications Provided to patient/caregiver  Name of Person Receiving AVS Discharge Instructions Including Medications Marie Myers  Name of Clinician That Reviewed AVS Discharge Instructions Including Medications Edy Mcbane, RN   Pt educated on changes made to medication & time next dose of pain medicine may be taken. Informed pt that prescribed vitamins may be taken OTC, and to ensure the dosage of vitamins purchased matches the dosage prescribed.

## 2023-06-08 NOTE — Plan of Care (Signed)
  Problem: Education: Goal: Knowledge of General Education information will improve Description: Including pain rating scale, medication(s)/side effects and non-pharmacologic comfort measures 06/08/2023 1241 by Donnita Falls, RN Outcome: Completed/Met 06/08/2023 1241 by Donnita Falls, RN Outcome: Progressing   Problem: Health Behavior/Discharge Planning: Goal: Ability to manage health-related needs will improve 06/08/2023 1241 by Donnita Falls, RN Outcome: Completed/Met 06/08/2023 1241 by Donnita Falls, RN Outcome: Progressing   Problem: Clinical Measurements: Goal: Ability to maintain clinical measurements within normal limits will improve 06/08/2023 1241 by Donnita Falls, RN Outcome: Completed/Met 06/08/2023 1241 by Donnita Falls, RN Outcome: Progressing Goal: Will remain free from infection 06/08/2023 1241 by Donnita Falls, RN Outcome: Completed/Met 06/08/2023 1241 by Donnita Falls, RN Outcome: Progressing Goal: Diagnostic test results will improve 06/08/2023 1241 by Donnita Falls, RN Outcome: Completed/Met 06/08/2023 1241 by Donnita Falls, RN Outcome: Progressing Goal: Respiratory complications will improve 06/08/2023 1241 by Donnita Falls, RN Outcome: Completed/Met 06/08/2023 1241 by Donnita Falls, RN Outcome: Progressing Goal: Cardiovascular complication will be avoided 06/08/2023 1241 by Donnita Falls, RN Outcome: Completed/Met 06/08/2023 1241 by Donnita Falls, RN Outcome: Progressing   Problem: Activity: Goal: Risk for activity intolerance will decrease 06/08/2023 1241 by Donnita Falls, RN Outcome: Completed/Met 06/08/2023 1241 by Donnita Falls, RN Outcome: Progressing   Problem: Nutrition: Goal: Adequate nutrition will be maintained 06/08/2023 1241 by Donnita Falls, RN Outcome: Completed/Met 06/08/2023 1241 by Donnita Falls, RN Outcome: Progressing   Problem: Coping: Goal: Level of anxiety will decrease 06/08/2023 1241 by Donnita Falls, RN Outcome:  Completed/Met 06/08/2023 1241 by Donnita Falls, RN Outcome: Progressing   Problem: Elimination: Goal: Will not experience complications related to bowel motility 06/08/2023 1241 by Donnita Falls, RN Outcome: Completed/Met 06/08/2023 1241 by Donnita Falls, RN Outcome: Progressing Goal: Will not experience complications related to urinary retention 06/08/2023 1241 by Donnita Falls, RN Outcome: Completed/Met 06/08/2023 1241 by Donnita Falls, RN Outcome: Progressing   Problem: Pain Managment: Goal: General experience of comfort will improve 06/08/2023 1241 by Donnita Falls, RN Outcome: Completed/Met 06/08/2023 1241 by Donnita Falls, RN Outcome: Progressing   Problem: Safety: Goal: Ability to remain free from injury will improve 06/08/2023 1241 by Donnita Falls, RN Outcome: Completed/Met 06/08/2023 1241 by Donnita Falls, RN Outcome: Progressing   Problem: Skin Integrity: Goal: Risk for impaired skin integrity will decrease 06/08/2023 1241 by Donnita Falls, RN Outcome: Completed/Met 06/08/2023 1241 by Donnita Falls, RN Outcome: Progressing

## 2023-06-08 NOTE — Progress Notes (Signed)
Ambulated w/ pt in room & in hall on room air. SpO2 98% while at rest, and 92-97% while ambulating. Pt complained of some shortness of breath & sharp pains while taking deep breaths.

## 2023-06-08 NOTE — Discharge Summary (Signed)
Physician Discharge Summary   Patient: Marie Myers MRN: [id number] DOB: Nov 27, 1990  Admit date:     06/03/2023  Discharge date: 06/08/23  Discharge Physician: Brendia Sacks   PCP: Delfino Lovett, FNP   Recommendations at discharge:   Necrotizing pneumonia/lung abscess Seen by pulmonology. Will need at least 4 to 6 weeks of Augmentin and repeat CT chest in 6 to 8 weeks to assess for clearing.  Continue antibiotics until that time.   If lesions persist may need to consider bronchoscopy  Consider outpatient dental evaluation and possible extraction    Nausea and vomiting in the setting of previous bariatric surgery She can see [facility] bariatric surgeons to determine if she needs anything addressed with her hiatal hernia or other further interventions. Nothing emergent.     Advanced hepatic steatosis Hepatomegaly Follow-up as an outpatient.   Discharge Diagnoses: Principal Problem:   Necrotizing pneumonia (HCC) Active Problems:   Seizure disorder (HCC)   Alcohol use   Anxiety   Thrombocytopenia (HCC)   Folate deficiency   Macrocytic anemia  Resolved Problems:   * No resolved hospital problems. *  Hospital Course: 32 year old woman PMH including daily alcohol use, seizure disorder, depression, anxiety, presented with right-sided chest pain, nausea and vomiting.  Admitted for necrotizing pneumonia.  Seen by pulmonology and general surgery.  Pulmonology recommended long-term antibiotics and outpatient follow-up.  Seen by general surgery for abnormality on CT scan from previous bariatric surgery.  Plan for outpatient follow-up.  Consultants Pulmonology General surgery   Procedures None  Necrotizing pneumonia/lung abscess In the setting of recurrent nausea and vomiting as well as possible seizure episodes.  Suspected to have aspirated multiple times.   Seen by pulmonology, treating with Unasyn.  Will need at least 4 to 6 weeks of Augmentin and repeat CT chest in 6 to 8 weeks  to assess for clearing.  Continue antibiotics until that time.   If lesions persist may need to consider bronchoscopy  Consider outpatient dental evaluation and possible extraction  Follow-up quantiferon gold in the setting of recurrent episodes of nausea and vomiting as well as seizure episodes.  She likely aspirated from her episodes of emesis.  Do not suspect any oropharyngeal etiology at this time.   Nausea and vomiting in the setting of previous bariatric surgery CT scan showed the suture line crossing the diaphragm.  General surgery was consulted.  Upper GI series was done which did not show any leakage or perforation.  Surgery followed.   Continue PPI. Seen by bariatric coordinator She can see [facility] bariatric surgeons to determine if she needs anything addressed with her hiatal hernia or other further interventions. Nothing emergent.    Macrocytic anemia Thrombocytopenia Folate deficiency  B-12 low but WNL Drop in hemoglobin dilutional. Hgb stable at 9.1 today Significance of thrombocytopenia unclear.  Plts up to 140 today Continue folate supplementation   Seizure disorder Appears stable.  Continue Keppra.   Alcohol use disorder Marijuana use No signs or symptoms of withdrawal. Recommend abstinence   Depression Anxiety Insomnia   Advanced hepatic steatosis Hepatomegaly Follow-up as an outpatient.   Positive blood culture -- contaminant    Resolved Hypokalemia   Hyponatremia    Pain control - [facility] Controlled Substance Reporting System database was reviewed.   Disposition: Home Diet recommendation:  Regular diet DISCHARGE MEDICATION: Allergies as of 06/08/2023       Reactions   Zithromax [azithromycin] Hives        Medication List     STOP  taking these medications    potassium chloride 20 MEQ packet Commonly known as: KLOR-CON   promethazine 25 MG suppository Commonly known as: PHENERGAN       TAKE these medications    acetaminophen  325 MG tablet Commonly known as: TYLENOL Take 2 tablets (650 mg total) by mouth every 6 (six) hours as needed for mild pain (pain score 1-3) or fever.   amoxicillin-clavulanate 875-125 MG tablet Commonly known as: AUGMENTIN Take 1 tablet by mouth 2 (two) times daily. For necrotizing pneumonia   ARIPiprazole 5 MG tablet Commonly known as: ABILIFY Take 1 tablet (5 mg total) by mouth daily.   cyanocobalamin 500 MCG tablet Commonly known as: VITAMIN B12 Take 1 tablet (500 mcg total) by mouth daily. Start taking on: June 09, 2023   folic acid 1 MG tablet Commonly known as: FOLVITE Take 1 tablet (1 mg total) by mouth daily. Start taking on: June 09, 2023   levETIRAcetam 500 MG tablet Commonly known as: KEPPRA Take 1 tablet (500 mg total) by mouth 2 (two) times daily. What changed: Another medication with the same name was removed. Continue taking this medication, and follow the directions you see here.   multivitamin with minerals Tabs tablet Take 1 tablet by mouth daily. Start taking on: June 09, 2023   oxyCODONE 5 MG immediate release tablet Commonly known as: Oxy IR/ROXICODONE Take 1 tablet (5 mg total) by mouth every 6 (six) hours as needed for moderate pain (pain score 4-6).   pantoprazole 40 MG tablet Commonly known as: PROTONIX Take 1 tablet (40 mg total) by mouth daily.   thiamine 100 MG tablet Commonly known as: Vitamin B-1 Take 1 tablet (100 mg total) by mouth daily. Start taking on: June 09, 2023   traZODone 100 MG tablet Commonly known as: DESYREL Take 2 tablets (200 mg total) by mouth at bedtime.        Follow-up Information     Delfino Lovett, FNP. Schedule an appointment as soon as possible for a visit in 1 week(s).   Specialty: Pain Medicine Contact information: [address] [place] [address] [phone]         [facility] Follow up.   Specialty: Pulmonology Why: Office will contact you  with an appointment Contact information: [address] [place] [address] [phone]               Feels better  Discharge Exam: [name] Weights   06/03/23 1307  Weight: 80.7 kg   Physical Exam Vitals reviewed.  Constitutional:      General: She is not in acute distress.    Appearance: She is not ill-appearing or toxic-appearing.  Cardiovascular:     Rate and Rhythm: Normal rate and regular rhythm.     Heart sounds: No murmur heard. Pulmonary:     Effort: Pulmonary effort is normal. No respiratory distress.     Breath sounds: No wheezing, rhonchi or rales.  Neurological:     Mental Status: She is alert.  Psychiatric:        Mood and Affect: Mood normal.        Behavior: Behavior normal.   Hgb stable 3.4 Hgb stable 9.5 Plts up to 140   Condition at discharge: good  The results of significant diagnostics from this hospitalization (including imaging, microbiology, ancillary and laboratory) are listed below for reference.   Imaging Studies: DG UGI W SINGLE CM (SOL OR THIN  BA)  Result Date: 06/04/2023 CLINICAL DATA:  History of gastric bypass and right-sided necrotizing pneumonia. Rule out esophageal leak. EXAM: WATER SOLUBLE UPPER GI SERIES TECHNIQUE: Single-column upper GI series was performed using water soluble contrast. Radiation Exposure Index (as provided by the fluoroscopic device): 14.5 mGy Kerma CONTRAST:  50 cc of Omnipaque 300 COMPARISON:  Yesterday's chest abdomen and pelvic CTs FINDINGS: Patient was unable to tolerate oblique or prone imaging. Single-contrast focused exam was performed with the patient supine. Preprocedure scout film is unremarkable. Left proximal femur fixation. Degenerative changes of the right hip are significantly age advanced. 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 Normal esophageal caliber. No significant hiatal hernia. Contrast stasis throughout the esophagus suggests a component of  dysmotility. No contrast extravasation to suggest esophageal perforation. IMPRESSION: No evidence of esophageal perforation or significant hiatal hernia. Limited single-contrast exam performed with the patient supine secondary to immobility as detailed above. Electronically Signed   By: Jeronimo Greaves M.D.   On: 06/04/2023 15:46   CT ABDOMEN PELVIS W CONTRAST  Result Date: 06/03/2023 CLINICAL DATA:  Acute abdominal pain. EXAM: CT ABDOMEN AND PELVIS WITH CONTRAST TECHNIQUE: Multidetector CT imaging of the abdomen and pelvis was performed using the standard protocol following bolus administration of intravenous contrast. RADIATION DOSE REDUCTION: This exam was performed according to the departmental dose-optimization program which includes automated exposure control, adjustment of the mA and/or kV according to patient size and/or use of iterative reconstruction technique. CONTRAST:  OMNIPAQUE IOHEXOL 350 MG/ML SOLN COMPARISON:  CT 02/11/2023 FINDINGS: Lower chest: Assessed on concurrent chest CT, reported separately. Hepatobiliary: Advanced hepatic steatosis. More focal fatty infiltration adjacent to the falciform ligament. The liver is enlarged spanning 19.6 cm cranial caudal. Clips in the gallbladder fossa postcholecystectomy. No biliary dilatation. Pancreas: Unremarkable. No pancreatic ductal dilatation or surrounding inflammatory changes. Spleen: Normal in size without focal abnormality. Adrenals/Urinary Tract: No adrenal nodule. No hydronephrosis, renal inflammation or focal renal abnormality. Moderately distended bladder, normal for degree of distension. 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 The excluded gastric remnant is minimally distended with fluid. The Roux limb is nondilated. The jejunal anastomosis is unremarkable. There is no bowel obstruction or inflammatory change. Small volume of stool in the colon. Normal  appendix visualized. Vascular/Lymphatic: No acute vascular findings. Normal caliber abdominal aorta. Patent portal vein. No abdominopelvic adenopathy. Reproductive: Uterus and bilateral adnexa are unremarkable. Other: No free air, free fluid, or intra-abdominal fluid collection. Musculoskeletal: There are no acute or suspicious osseous abnormalities. Surgical screw traverses the left intertrochanteric femur. Chronic changes of the bilateral hips. IMPRESSION: 1. No acute abnormality in the abdomen/pelvis. 2. Hepatomegaly and advanced hepatic steatosis. 3. Bariatric surgery with gastric suture line crossing the diaphragmatic hiatus and small hiatal hernia. Electronically Signed   By: Narda Rutherford M.D.   On: 06/03/2023 22:52   CT Angio Chest PE W and/or Wo Contrast  Result Date: 06/03/2023 CLINICAL DATA:  Pulmonary embolism (PE) suspected, high prob Right-sided pain. EXAM: CT ANGIOGRAPHY CHEST WITH CONTRAST TECHNIQUE: Multidetector CT imaging of the chest was performed using the standard protocol during bolus administration of intravenous contrast. Multiplanar CT image reconstructions and MIPs were obtained to evaluate the vascular anatomy. RADIATION DOSE REDUCTION: This exam was performed according to the departmental dose-optimization program which includes automated exposure control, adjustment of the mA and/or kV according to patient size and/or use of iterative reconstruction technique. CONTRAST:  OMNIPAQUE IOHEXOL 350 MG/ML SOLN COMPARISON:  Chest radiograph earlier today.  Chest CT 10/16/2020 FINDINGS: Cardiovascular: There are no filling defects within the pulmonary arteries to suggest pulmonary embolus. Evaluation is diagnostic to the segmental level. The subsegmental branches are not well assessed due to phase of contrast. Mild dilatation of the main pulmonary artery at 3 cm. Thoracic aorta is normal in caliber. Common origin of the brachiocephalic and left common carotid artery, variant  anatomy. The heart is upper normal in size. No pericardial effusion. Mediastinum/Nodes: Prominent right hilar node at 10 mm. No mediastinal adenopathy. There is a small hiatal hernia. Bariatric surgery with gastric suture line crossing the diaphragmatic hiatus. Lungs/Pleura: Elongated cavitary process in the periphery of the right upper and right lower lobe. Above the major fissure there is a peripheral consolidative opacity with surrounding ground-glass and central focus of air. This extends inferiorly in the subpleural space into the right lower lobe where there is a sub pulmonic air-fluid collection measuring 4.5 x 1.9 x 7 cm, series 12, image 69 there is also a small dependent right pleural effusion. The left lung is clear. There are no features of pulmonary edema. Trachea and central airways are clear. Upper Abdomen: Assessed on concurrent abdominopelvic CT, reported separately. Musculoskeletal: There are no acute or suspicious osseous abnormalities. No rib fracture, destructive or erosive changes, particularly of right ribs. Review of the MIP images confirms the above findings. IMPRESSION: 1. No pulmonary embolus. 2. Elongated cavitary process in the periphery of the right upper and right lower lobe. Right upper lobe peripheral consolidative opacity with surrounding ground-glass and central focus of air. This extends inferiorly in the subpleural space into the right lower lobe where there is a sub pulmonic air-fluid collection measuring 4.5 x 1.9 x 7 cm. There is also a small dependent right pleural effusion. Findings are most consistent with necrotizing pneumonia. Recommend follow-up imaging to resolution. 3. Prominent right hilar node at 10 mm, likely reactive. 4. Small hiatal hernia. Bariatric surgery with gastric suture line crossing the diaphragmatic hiatus. 5. Mild dilatation of the main pulmonary artery, can be seen with pulmonary arterial hypertension. Electronically Signed   By: Narda Rutherford M.D.    On: 06/03/2023 22:49   DG Chest 2 View  Result Date: 06/03/2023 CLINICAL DATA:  Chest pain. EXAM: CHEST - 2 VIEW COMPARISON:  02/11/2023. FINDINGS: Bilateral lung fields are clear. There is trace right pleural effusion, new since the prior study. Left lateral costophrenic angle is clear. No pneumothorax on either side. Normal cardio-mediastinal silhouette. No acute osseous abnormalities. The soft tissues are within normal limits. IMPRESSION: *New trace right pleural effusion. Electronically Signed   By: Jules Schick M.D.   On: 06/03/2023 15:12    Microbiology: Results for orders placed or performed during the hospital encounter of 06/03/23  Resp panel by RT-PCR (RSV, Flu A&B, Covid) Anterior Nasal Swab     Status: None   Collection Time: 06/03/23 11:35 PM   Specimen: Anterior Nasal Swab  Result Value Ref Range Status   SARS Coronavirus 2 by RT PCR NEGATIVE NEGATIVE Final    Comment: (NOTE) SARS-CoV-2 target nucleic acids are NOT DETECTED.  The SARS-CoV-2 RNA is generally detectable in upper respiratory specimens during the acute phase of infection. The lowest concentration of SARS-CoV-2 viral copies this assay can detect is 138 copies/mL. A negative result does not preclude SARS-Cov-2 infection and should not be used as the sole basis for treatment or other patient management decisions. A negative result may occur with  improper specimen collection/handling, submission  of specimen other than nasopharyngeal swab, presence of viral mutation(s) within the areas targeted by this assay, and inadequate number of viral copies(<138 copies/mL). A negative result must be combined with clinical observations, patient history, and epidemiological information. The expected result is Negative.  Fact Sheet for Patients:  BloggerCourse.com  Fact Sheet for Healthcare Providers:  SeriousBroker.it  This test is no t yet approved or cleared by the  [place] FDA and  has been authorized for detection and/or diagnosis of SARS-CoV-2 by FDA under an Emergency Use Authorization (EUA). This EUA will remain  in effect (meaning this test can be used) for the duration of the COVID-19 declaration under Section 564(b)(1) of the Act, 21 U.S.C.section 360bbb-3(b)(1), unless the authorization is terminated  or revoked sooner.       Influenza A by PCR NEGATIVE NEGATIVE Final   Influenza B by PCR NEGATIVE NEGATIVE Final    Comment: (NOTE) The Xpert Xpress SARS-CoV-2/FLU/RSV plus assay is intended as an aid in the diagnosis of influenza from Nasopharyngeal swab specimens and should not be used as a sole basis for treatment. Nasal washings and aspirates are unacceptable for Xpert Xpress SARS-CoV-2/FLU/RSV testing.  Fact Sheet for Patients: BloggerCourse.com  Fact Sheet for Healthcare Providers: SeriousBroker.it  This test is not yet approved or cleared by the [place] FDA and has been authorized for detection and/or diagnosis of SARS-CoV-2 by FDA under an Emergency Use Authorization (EUA). This EUA will remain in effect (meaning this test can be used) for the duration of the COVID-19 declaration under Section 564(b)(1) of the Act, 21 U.S.C. section 360bbb-3(b)(1), unless the authorization is terminated or revoked.     Resp Syncytial Virus by PCR NEGATIVE NEGATIVE Final    Comment: (NOTE) Fact Sheet for Patients: BloggerCourse.com  Fact Sheet for Healthcare Providers: SeriousBroker.it  This test is not yet approved or cleared by the [place] FDA and has been authorized for detection and/or diagnosis of SARS-CoV-2 by FDA under an Emergency Use Authorization (EUA). This EUA will remain in effect (meaning this test can be used) for the duration of the COVID-19 declaration under Section 564(b)(1) of the Act, 21 U.S.C. section  360bbb-3(b)(1), unless the authorization is terminated or revoked.  Performed at [facility], [address], [place], [place] [address]   Blood culture (routine x 2)     Status: Abnormal   Collection Time: 06/03/23 11:53 PM   Specimen: BLOOD RIGHT HAND  Result Value Ref Range Status   Specimen Description   Final    BLOOD RIGHT HAND Performed at [facility], [address] [place], [place] [address]    Special Requests   Final    BOTTLES DRAWN AEROBIC AND ANAEROBIC Blood Culture adequate volume Performed at [facility], [address] [place], [place] [address]    Culture  Setup Time   Final    GRAM POSITIVE COCCI IN CLUSTERS ANAEROBIC BOTTLE ONLY CRITICAL RESULT CALLED TO, READ BACK BY AND VERIFIED WITH: PHARMD MICHELLE LILLISTON ON 06/04/23 @ 2233 BY DRT     Culture (A)  Final    STAPHYLOCOCCUS HOMINIS THE SIGNIFICANCE OF ISOLATING THIS ORGANISM FROM A SINGLE SET OF BLOOD CULTURES WHEN MULTIPLE SETS ARE DRAWN IS UNCERTAIN. PLEASE NOTIFY THE MICROBIOLOGY DEPARTMENT WITHIN ONE WEEK IF SPECIATION AND SENSITIVITIES ARE REQUIRED. Performed at [facility], [address] [place] [address] [place], [place] [address]    Report Status 06/06/2023 FINAL  Final  Blood Culture ID Panel (Reflexed)  Status: Abnormal   Collection Time: 06/03/23 11:53 PM  Result Value Ref Range Status   Enterococcus faecalis NOT DETECTED NOT DETECTED Final   Enterococcus Faecium NOT DETECTED NOT DETECTED Final   Listeria monocytogenes NOT DETECTED NOT DETECTED Final   Staphylococcus species DETECTED (A) NOT DETECTED Final    Comment: CRITICAL RESULT CALLED TO, READ BACK BY AND VERIFIED WITH: PHARMD MICHELLE LILLISTON ON 06/04/23 @ 2233 BY DRT     Staphylococcus aureus (BCID) NOT DETECTED NOT DETECTED Final   Staphylococcus epidermidis NOT DETECTED NOT DETECTED Final   Staphylococcus lugdunensis NOT DETECTED NOT DETECTED Final   Streptococcus species  NOT DETECTED NOT DETECTED Final   Streptococcus agalactiae NOT DETECTED NOT DETECTED Final   Streptococcus pneumoniae NOT DETECTED NOT DETECTED Final   Streptococcus pyogenes NOT DETECTED NOT DETECTED Final   A.calcoaceticus-baumannii NOT DETECTED NOT DETECTED Final   Bacteroides fragilis NOT DETECTED NOT DETECTED Final   Enterobacterales NOT DETECTED NOT DETECTED Final   Enterobacter cloacae complex NOT DETECTED NOT DETECTED Final   Escherichia coli NOT DETECTED NOT DETECTED Final   Klebsiella aerogenes NOT DETECTED NOT DETECTED Final   Klebsiella oxytoca NOT DETECTED NOT DETECTED Final   Klebsiella pneumoniae NOT DETECTED NOT DETECTED Final   Proteus species NOT DETECTED NOT DETECTED Final   Salmonella species NOT DETECTED NOT DETECTED Final   Serratia marcescens NOT DETECTED NOT DETECTED Final   Haemophilus influenzae NOT DETECTED NOT DETECTED Final   Neisseria meningitidis NOT DETECTED NOT DETECTED Final   Pseudomonas aeruginosa NOT DETECTED NOT DETECTED Final   Stenotrophomonas maltophilia NOT DETECTED NOT DETECTED Final   Candida albicans NOT DETECTED NOT DETECTED Final   Candida auris NOT DETECTED NOT DETECTED Final   Candida glabrata NOT DETECTED NOT DETECTED Final   Candida krusei NOT DETECTED NOT DETECTED Final   Candida parapsilosis NOT DETECTED NOT DETECTED Final   Candida tropicalis NOT DETECTED NOT DETECTED Final   Cryptococcus neoformans/gattii NOT DETECTED NOT DETECTED Final    Comment: Performed at [facility], [address] [place], [place] [address]  Blood culture (routine x 2)     Status: None (Preliminary result)   Collection Time: 06/04/23 12:00 AM   Specimen: BLOOD LEFT HAND  Result Value Ref Range Status   Specimen Description   Final    BLOOD LEFT HAND Performed at [facility], [address] [place], [place] [address]    Special Requests   Final    BOTTLES DRAWN AEROBIC AND ANAEROBIC Blood Culture results may not be optimal  due to an excessive volume of blood received in culture bottles Performed at [facility], [address] [place], [place] [address]    Culture   Final    NO GROWTH 4 DAYS Performed at [facility], [address] [place], [place] [address]    Report Status PENDING  Incomplete  MRSA Next Gen by PCR, Nasal     Status: None   Collection Time: 06/04/23  2:12 AM   Specimen: Nasal Mucosa; Nasal Swab  Result Value Ref Range Status   MRSA by PCR Next Gen NOT DETECTED NOT DETECTED Final    Comment: (NOTE) The GeneXpert MRSA Assay (FDA approved for NASAL specimens only), is one component of a comprehensive MRSA colonization surveillance program. It is not intended to diagnose MRSA infection nor to guide or monitor treatment for MRSA infections. Test performance is not FDA approved in patients less than 45 years old. Performed at  [facility], [address], [place], [place] [address]     Labs: CBC: Recent Labs  Lab 06/03/23 1433 06/04/23 0834 06/06/23 0536 06/07/23 0340 06/08/23 0332  WBC 14.9* 11.1* 8.7 9.7 9.2  NEUTROABS  --  9.1*  --   --   --   HGB 13.1 10.5* 9.2* 9.1* 9.5*  HCT 37.7 30.3* 28.0* 27.5* 28.9*  MCV 104.1* 106.3* 111.6* 110.9* 112.5*  PLT 198 155 96* 119* 140*   Basic Metabolic Panel: Recent Labs  Lab 06/04/23 0834 06/05/23 0753 06/06/23 0343 06/07/23 0340 06/08/23 0332  NA 131* 131* 135 136 135  K 3.4* 3.1* 4.1 3.5 3.4*  CL 94* 93* 101 103 102  CO2 24 24 24 23 24   GLUCOSE 79 76 73 81 83  BUN <5* <5* <5* <5* <5*  CREATININE 0.54 0.67 0.47 0.44 0.51  CALCIUM 8.3* 8.4* 7.8* 8.0* 7.9*  MG  --  1.5* 2.3  --   --    Liver Function Tests: Recent Labs  Lab 06/03/23 1433  AST 114*  ALT 40  ALKPHOS 102  BILITOT 0.7  PROT 7.3  ALBUMIN 3.9   CBG: No results for input(s): "GLUCAP" in the last 168 hours.  Discharge time spent: less than 30 minutes.  Signed: Brendia Sacks, MD [facility] 06/08/2023

## 2023-06-08 NOTE — Telephone Encounter (Signed)
Fax received for prior authorization of Aripiprazole 5mg . Called [phone] spoke with Carollee Herter who states decision will be faxed. [id number].

## 2023-06-09 ENCOUNTER — Encounter: Payer: Self-pay | Admitting: Acute Care

## 2023-06-09 LAB — CULTURE, BLOOD (ROUTINE X 2): Culture: NO GROWTH

## 2023-06-09 LAB — QUANTIFERON-TB GOLD PLUS (RQFGPL)
QuantiFERON Mitogen Value: 0.95 [IU]/mL
QuantiFERON Nil Value: 0 [IU]/mL
QuantiFERON TB1 Ag Value: 0.01 [IU]/mL
QuantiFERON TB2 Ag Value: 0 [IU]/mL

## 2023-06-09 LAB — QUANTIFERON-TB GOLD PLUS: QuantiFERON-TB Gold Plus: NEGATIVE

## 2023-06-15 ENCOUNTER — Encounter (HOSPITAL_COMMUNITY): Payer: Medicaid Other | Admitting: Psychiatry

## 2023-06-15 ENCOUNTER — Emergency Department (HOSPITAL_COMMUNITY)
Admission: EM | Admit: 2023-06-15 | Discharge: 2023-06-15 | Discharge status: Against Medical Advice | Payer: Medicaid Other | Attending: Emergency Medicine | Admitting: Emergency Medicine

## 2023-06-15 DIAGNOSIS — Z5321 Procedure and treatment not carried out due to patient leaving prior to being seen by health care provider: Secondary | ICD-10-CM | POA: Diagnosis not present

## 2023-06-15 DIAGNOSIS — J188 Other pneumonia, unspecified organism: Secondary | ICD-10-CM | POA: Insufficient documentation

## 2023-06-22 ENCOUNTER — Ambulatory Visit (HOSPITAL_COMMUNITY): Payer: Medicaid Other

## 2023-06-22 ENCOUNTER — Other Ambulatory Visit: Payer: Medicaid Other

## 2023-06-22 ENCOUNTER — Encounter (HOSPITAL_COMMUNITY): Payer: Self-pay

## 2023-06-22 DIAGNOSIS — F1994 Other psychoactive substance use, unspecified with psychoactive substance-induced mood disorder: Secondary | ICD-10-CM

## 2023-06-22 DIAGNOSIS — F1020 Alcohol dependence, uncomplicated: Secondary | ICD-10-CM

## 2023-06-22 DIAGNOSIS — F1220 Cannabis dependence, uncomplicated: Secondary | ICD-10-CM

## 2023-06-22 NOTE — Progress Notes (Signed)
Comprehensive Clinical Assessment (CCA) Note  06/22/2023 Marie Myers [id number]  Chief Complaint:  Chief Complaint  Patient presents with   Addiction Problem   Visit Diagnosis: Alcohol Use Disorder, Severe, Dependence                              Cannabis Use Disorder, Severe Dependence                             Substance Use Induced Mood Disorder   CCA Screening, Triage and Referral (STR)  Patient Reported Information How did you hear about [place]? Other (Comment) (ER)  Referral name: No data recorded Referral phone number: No data recorded  Whom do you see for routine medical problems? Primary Care  Practice/Facility Name: [facility]  Practice/Facility Phone Number: No data recorded Name of Contact: No data recorded Contact Number: No data recorded Contact Fax Number: No data recorded Prescriber Name: No data recorded Prescriber Address (if known): No data recorded  What Is the Reason for Your Visit/Call Today? No data recorded How Long Has This Been Causing You Problems? > than 6 months  What Do You Feel Would Help You the Most Today? Alcohol or Drug Use Treatment; Treatment for Depression or other mood problem   Have You Recently Been in Any Inpatient Treatment (Hospital/Detox/Crisis Center/28-Day Program)? No  Name/Location of Program/Hospital:No data recorded How Long Were You There? No data recorded When Were You Discharged? No data recorded  Have You Ever Received Services From [facility] Before? Yes  Who Do You See at [facility]? primary Care   Have You Recently Had Any Thoughts About Hurting Yourself? No  Are You Planning to Commit Suicide/Harm Yourself At This time? No   Have you Recently Had Thoughts About Hurting Someone Karolee Ohs? No  Explanation: No data recorded  Have You Used Any Alcohol or Drugs in the Past 24 Hours? Yes  How Long Ago Did You Use Drugs or Alcohol? No data recorded What Did You Use and How Much? alcohol and  marijuana   Do You Currently Have a Therapist/Psychiatrist? No  Name of Therapist/Psychiatrist: No data recorded  Have You Been Recently Discharged From Any Office Practice or Programs? No data recorded Explanation of Discharge From Practice/Program: No data recorded    CCA Screening Triage Referral Assessment Type of Contact: Face-to-Face  Is this Initial or Reassessment? No data recorded Date Telepsych consult ordered in [facility]:  No data recorded Time Telepsych consult ordered in [facility]:  No data recorded  Patient Reported Information Reviewed? No data recorded Patient Left Without Being Seen? No data recorded Reason for Not Completing Assessment: No data recorded  Collateral Involvement: No data recorded  Does Patient Have a Court Appointed Legal Guardian? No data recorded Name and Contact of Legal Guardian: No data recorded If Minor and Not Living with Parent(s), Who has Custody? No data recorded Is CPS involved or ever been involved? Never  Is APS involved or ever been involved? Never   Patient Determined To Be At Risk for Harm To Self or Others Based on Review of Patient Reported Information or Presenting Complaint? No  Method: No data recorded Availability of Means: No data recorded Intent: No data recorded Notification Required: No data recorded Additional Information for Danger to Others Potential: No data recorded Additional Comments for Danger to Others Potential: No data recorded Are There Guns or Other Weapons in  Your Home? No  Types of Guns/Weapons: No data recorded Are These Weapons Safely Secured?                            No data recorded Who Could Verify You Are Able To Have These Secured: No data recorded Do You Have any Outstanding Charges, Pending Court Dates, Parole/Probation? no  Contacted To Inform of Risk of Harm To Self or Others: No data recorded  Location of Assessment: Other (comment)   Does Patient Present under Involuntary Commitment?  No  IVC Papers Initial File Date: No data recorded  [place] of Residence: [place]   Patient Currently Receiving the Following Services: No data recorded  Determination of Need: Routine (7 days)   Options For Referral: Outpatient Therapy     CCA Biopsychosocial Intake/Chief Complaint:  addiction, depression, anxiety  Current Symptoms/Problems: Marie Myers presents today with her finance to do the CCA.  She is asking for therapy for substance use, anxiety and depression symptoms.  She says she started smoking marijuna when she was in her teens, may at 32 years old.  Marie Myers say she started drinking alcohol at about 32 years old.  She says her father let them drink alcohol in his home as long as they would not go out of the house.Marie Myers says her depression onset about 10 years ago when her father passed away. Marie Myers becomes tearful when speaking of his father's death. She says they were very close. Marie Myers says her anxiety onset about 5 years ago when her sister died.  Because Marie Myers was already using substances when these tragedies happened, this therapist will diagnosis her mood symptoms as Substance Used Mood Disorder. Marie Myers denies withdrawal symptoms and says she has never had withdrawal symptoms.  She does have a Seizure Disorder in her [facility] History.  She says her last seizure was 6 weeks ago, which was 2 weeks before she was in the hospital with necrotizing pneumonia. Marie Myers says she, her son and fiance had to move in with her mother for financial reasons. Marie Myers says she has pain from having pneumonia and was taking Oxycodone 5mg  every day She says she does not see any providers out of [facility] currently but this therapist has not located a prescription for Oxycodone.  She takes Abilify 5mg  every day to manage her mood symptoms. Marie Myers says she has tried antidepressants but they have not worked. Therapist discusses detox with her, particularly in light of her seizure history.  She currently  is drinking 3-4 glasses of wine per day and says she has never had withdrawal symptoms when she would cut down or stop for a few days. Therapist explains when Marie Myers can come to get detoxed.   Marie Myers says she wants to work on reducing her mood symptoms and then she thinks she will stop using.  Patient Reported Schizophrenia/Schizoaffective Diagnosis in Past: No   Strengths: does not feels she has any  Preferences: mental health and substance use services  Abilities: "I fail at everything"   Type of Services Patient Feels are Needed: outpatient therapy   Initial Clinical Notes/Concerns: No data recorded  Mental Health Symptoms Depression:   Change in energy/activity; Difficulty Concentrating; Fatigue; Hopelessness; Increase/decrease in appetite; Irritability; Sleep (too much or little); Tearfulness; Worthlessness   Duration of Depressive symptoms:  Greater than two weeks   Mania:  No data recorded  Anxiety:    Difficulty concentrating; Fatigue; Irritability; Restlessness; Sleep; Worrying   Psychosis:  None ("i feel like I am in a dream.  I get an aura and other things are happening in my dream")   Duration of Psychotic symptoms: No data recorded  Trauma:   None ("I had trauma because I am the medical person in my family and I have had to care care of them". 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 feels like she is in a dream".)   Other Mood/Personality Symptoms:   depressive and anxiety sumptoms    Mental Status Exam Appearance and self-care   Stature:   Average   Weight:   Average weight   Clothing:   Casual   Grooming:   Normal   Cosmetic use:   None   Posture/gait:   Normal   Motor activity:   Not Remarkable   Sensorium  Attention:   Normal   Concentration:   Normal   Orientation:   X5   Recall/memory:   Normal   Affect and Mood  Affect:   Depressed   Mood:   Depressed   Relating  Eye contact:   Normal   Facial expression:   Depressed   Attitude toward examiner:   Cooperative   Thought and Language  Speech flow:  Clear and Coherent   Thought content:   Appropriate to Mood and Circumstances   Preoccupation:   None   Hallucinations:   None   Organization:  No data recorded  [employer] of Knowledge:   Average   Intelligence:   Average   Abstraction:   Abstract   Judgement:   Impaired (Thinks if other's think she is drinking even when she is not, she should continue drinking.)   Reality Testing:   Adequate   Insight:   Poor   Decision Making:  No data recorded  Social Functioning  Social Maturity:   Impulsive   Social Judgement:   Normal   Stress  Stressors:   Grief/losses   Coping Ability:   Human resources officer Deficits:   Interpersonal   Supports:   Friends/Service system     Religion: Religion/Spirituality Are You A Religious Person?: No  Leisure/Recreation: Leisure / Recreation Do You Have Hobbies?: No  Exercise/Diet: Exercise/Diet Do You Exercise?: No Have You Gained or Lost A Significant Amount of Weight in the Past Six Months?: No Do You Follow a Special Diet?: No Do You Have Any Trouble Sleeping?: Yes Explanation of Sleeping Difficulties: difficulty initiating and sustaining sleep   CCA Employment/Education Employment/Work Situation: Employment / Work Situation Employment Situation: Surveyor, minerals Job has Been Impacted by Current Illness: Yes Describe how Patient's Job has Been Impacted: "I don't have  the interest. When I get overwhelmed, I get seizures which are embarassing". 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?: No  Education: Engineer, civil (consulting) Currently Attending: Is currently studing for Engineer, site Last Grade Completed: 14 Name of High School: [employer] Did Garment/textile technologist From [employer]?: Yes Did You Attend College?: Yes What  Type of College Degree Do you Have?: AA Did You Attend Graduate School?: No What Was Your Major?: has a AA in athestician Did You Have Any Special Interests In School?: no Did You Have An Individualized Education Program (IIEP): No Did You Have Any Difficulty At School?: Yes Were Any Medications Ever Prescribed For These Difficulties?: Yes Medications Prescribed For School Difficulties?: Zoloft, Abilify Patient's Education Has Been Impacted by Current Illness: Yes How Does Current Illness Impact Education?: cannot focus or remember well   CCA Family/Childhood History Family and Relationship History:    Childhood History:          CCA Substance Use Alcohol/Drug Use: Alcohol / Drug Use Pain Medications: See PTA medication list Prescriptions: See PTA medication list Over the Counter: See MAR History of alcohol / drug use?: Yes Longest period of sobriety (when/how long): doesn't know. The only time I didn't smoke was when I was pregant with my son who is 45. Negative Consequences of Use: Financial, Legal, Personal relationships (DWI about 2 years ago) Substance #1 Name of Substance 1: alcohol 1 - Age of First Use: 15 1 - Amount (size/oz): 3-4 full glasses of wine 1 - Frequency: daily 1 - Duration: 16 years 1 - Last Use / Amount: Within the past 24 hours, has drank 4 glasses of wine 1 - Method of Aquiring: legal 1- Route of Use: oral Substance #2 Name of Substance 2: cannabis 2 - Age of First Use: around age 82 2 -  Amount (size/oz): smokes a clip or two per day. 2 - Frequency: daily 2 - Duration: 15 years 2 - Last Use / Amount: this morning.  Smoked a "couple of pulls" 2 - Method of Aquiring: illegal 2 - Route of Substance Use: inhalation                     ASAM's:  Six Dimensions of Multidimensional Assessment  Dimension 1:  Acute Intoxication and/or Withdrawal Potential:   Dimension 1:  Description of individual's past and current experiences of substance use and withdrawal: none  Dimension 2:  Biomedical Conditions and Complications:   Dimension 2:  Description of patient's biomedical conditions and  complications: Seizure Disorder  Dimension 3:  Emotional, Behavioral, or Cognitive Conditions and Complications:  Dimension 3:  Description of emotional, behavioral, or cognitive conditions and complications: depression and anxiety symptoms severe  Dimension 4:  Readiness to Change:  Dimension 4:  Description of Readiness to Change criteria: Elizah says she is tried and can't function but does not see the point in changing if others accuse her of drinking when she is not.  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  Mom is not supportive at all.  ASAM Severity Score: ASAM's Severity Rating Score: 13  ASAM Recommended Level of Treatment: ASAM Recommended Level of Treatment: Level I Outpatient Treatment   Substance use Disorder (SUD) Substance Use Disorder (SUD)  Checklist Symptoms of Substance Use: Continued use despite having a persistent/recurrent physical/psychological problem caused/exacerbated by use, Continued use despite persistent or recurrent social, interpersonal problems, caused or exacerbated by use, Presence of craving or strong urge to use, Recurrent use that  results in a failure to fulfill major role obligations (work, school, home), Repeated use in physically hazardous situations, Social, occupational, recreational  activities given up or reduced due to use  Recommendations for Services/Supports/Treatments: Recommendations for Services/Supports/Treatments Recommendations For Services/Supports/Treatments: Other (Comment) (Era will receive individual therapy as she will be on opioids for Pain Mangement orginating from the pneumonia she recently had.)  DSM5 Diagnoses: Patient Active Problem List   Diagnosis Date Noted   Macrocytic anemia 06/07/2023   Thrombocytopenia (HCC) 06/06/2023   Folate deficiency 06/06/2023   Necrotizing pneumonia (HCC) 06/04/2023   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/01/2023   Seizures (HCC) 10/03/2022   Anxiety 10/03/2022   Insomnia 10/03/2022   Leukocytosis 10/03/2022   Seizure-like activity (HCC) 08/23/2022   Alcohol use 08/23/2022   Seizure disorder (HCC) 08/22/2022   MDD (major depressive disorder), recurrent episode, severe (HCC) 01/10/2022   Alcohol use disorder, severe, dependence (HCC) 01/08/2021   Marijuana use 01/08/2021   MDD (major depressive disorder), recurrent severe, without psychosis (HCC) 01/07/2021   Metabolic acidemia 10/21/2020   Tetrahydrocannabinol (THC) dependence (HCC) 10/16/2020   Acute lower UTI 10/16/2020   Tachypnea 10/16/2020   Dehydration 10/16/2020   Hypertensive urgency 10/16/2020   Intractable nausea and vomiting 10/16/2020   Abdominal pain 08/11/2019    Patient Centered Plan: Patient is on the following Treatment Plan(s):     Referrals to Alternative Service(s): Referred to Alternative Service(s):   Place:   Date:   Time:    Referred to Alternative Service(s):   Place:   Date:   Time:    Referred to Alternative Service(s):   Place:   Date:   Time:    Referred to Alternative Service(s):   Place:   Date:   Time:      Collaboration of Care:  Notified psychiatrist that this therapist would be seeing [place] for therapy.  Patient/Guardian was advised Release of Information must be obtained prior to any record release in order to collaborate their care with an outside provider. Patient/Guardian was advised if they have not already done so to contact the registration department to sign all necessary forms in order for [place] to release information regarding their care.   Consent: Patient/Guardian gives verbal consent for treatment and assignment of benefits for services provided during this visit. Patient/Guardian expressed understanding and agreed to proceed.   Remigio Eisenmenger, MS., LMFT, LCAS 06-18-23

## 2023-06-26 ENCOUNTER — Emergency Department (HOSPITAL_COMMUNITY): Payer: Medicaid Other

## 2023-06-26 ENCOUNTER — Inpatient Hospital Stay (HOSPITAL_COMMUNITY)
Admission: EM | Admit: 2023-06-26 | Discharge: 2023-06-30 | DRG: 392 | Disposition: A | Discharge status: Home / Self Care | Payer: Medicaid Other | Attending: Internal Medicine | Admitting: Internal Medicine

## 2023-06-26 ENCOUNTER — Encounter (HOSPITAL_COMMUNITY): Payer: Self-pay | Admitting: Emergency Medicine

## 2023-06-26 ENCOUNTER — Other Ambulatory Visit: Payer: Self-pay

## 2023-06-26 DIAGNOSIS — E559 Vitamin D deficiency, unspecified: Secondary | ICD-10-CM | POA: Diagnosis present

## 2023-06-26 DIAGNOSIS — R112 Nausea with vomiting, unspecified: Principal | ICD-10-CM | POA: Diagnosis present

## 2023-06-26 DIAGNOSIS — Z9884 Bariatric surgery status: Secondary | ICD-10-CM

## 2023-06-26 DIAGNOSIS — E876 Hypokalemia: Secondary | ICD-10-CM | POA: Diagnosis present

## 2023-06-26 DIAGNOSIS — Z881 Allergy status to other antibiotic agents status: Secondary | ICD-10-CM

## 2023-06-26 DIAGNOSIS — E538 Deficiency of other specified B group vitamins: Secondary | ICD-10-CM | POA: Diagnosis present

## 2023-06-26 DIAGNOSIS — F332 Major depressive disorder, recurrent severe without psychotic features: Secondary | ICD-10-CM | POA: Diagnosis present

## 2023-06-26 DIAGNOSIS — Z833 Family history of diabetes mellitus: Secondary | ICD-10-CM

## 2023-06-26 DIAGNOSIS — R109 Unspecified abdominal pain: Secondary | ICD-10-CM | POA: Diagnosis present

## 2023-06-26 DIAGNOSIS — G40909 Epilepsy, unspecified, not intractable, without status epilepticus: Secondary | ICD-10-CM

## 2023-06-26 DIAGNOSIS — G4700 Insomnia, unspecified: Secondary | ICD-10-CM | POA: Diagnosis present

## 2023-06-26 DIAGNOSIS — I10 Essential (primary) hypertension: Secondary | ICD-10-CM | POA: Diagnosis present

## 2023-06-26 DIAGNOSIS — Z9049 Acquired absence of other specified parts of digestive tract: Secondary | ICD-10-CM

## 2023-06-26 DIAGNOSIS — Z1152 Encounter for screening for COVID-19: Secondary | ICD-10-CM

## 2023-06-26 DIAGNOSIS — K2100 Gastro-esophageal reflux disease with esophagitis, without bleeding: Principal | ICD-10-CM | POA: Diagnosis present

## 2023-06-26 DIAGNOSIS — Z79899 Other long term (current) drug therapy: Secondary | ICD-10-CM

## 2023-06-26 DIAGNOSIS — F1020 Alcohol dependence, uncomplicated: Secondary | ICD-10-CM | POA: Diagnosis present

## 2023-06-26 LAB — URINALYSIS, ROUTINE W REFLEX MICROSCOPIC
Bacteria, UA: NONE SEEN
Bilirubin Urine: NEGATIVE
Glucose, UA: NEGATIVE mg/dL
Hgb urine dipstick: NEGATIVE
Ketones, ur: 80 mg/dL — AB
Nitrite: NEGATIVE
Protein, ur: 100 mg/dL — AB
Specific Gravity, Urine: 1.024 (ref 1.005–1.030)
pH: 5 (ref 5.0–8.0)

## 2023-06-26 LAB — COMPREHENSIVE METABOLIC PANEL
ALT: 15 U/L (ref 0–44)
AST: 16 U/L (ref 15–41)
Albumin: 4.2 g/dL (ref 3.5–5.0)
Alkaline Phosphatase: 79 U/L (ref 38–126)
Anion gap: 16 — ABNORMAL HIGH (ref 5–15)
BUN: <5 mg/dL — ABNORMAL LOW (ref 6–20)
CO2: 21 mmol/L — ABNORMAL LOW (ref 22–32)
Calcium: 9.9 mg/dL (ref 8.9–10.3)
Chloride: 101 mmol/L (ref 98–111)
Creatinine, Ser: 0.59 mg/dL (ref 0.44–1.00)
GFR, Estimated: >60 mL/min (ref >60)
Glucose, Bld: 150 mg/dL — ABNORMAL HIGH (ref 70–99)
Potassium: 3.6 mmol/L (ref 3.5–5.1)
Sodium: 138 mmol/L (ref 135–145)
Total Bilirubin: 0.9 mg/dL (ref 0.3–1.2)
Total Protein: 8.1 g/dL (ref 6.5–8.1)

## 2023-06-26 LAB — CBC
HCT: 40.2 % (ref 36.0–46.0)
Hemoglobin: 13.4 g/dL (ref 12.0–15.0)
MCH: 36 pg — ABNORMAL HIGH (ref 26.0–34.0)
MCHC: 33.3 g/dL (ref 30.0–36.0)
MCV: 108.1 fL — ABNORMAL HIGH (ref 80.0–100.0)
Platelets: 398 10*3/uL (ref 150–400)
RBC: 3.72 MIL/uL — ABNORMAL LOW (ref 3.87–5.11)
RDW: 17.3 % — ABNORMAL HIGH (ref 11.5–15.5)
WBC: 14.3 10*3/uL — ABNORMAL HIGH (ref 4.0–10.5)
nRBC: 0 % (ref 0.0–0.2)

## 2023-06-26 LAB — LIPASE, BLOOD: Lipase: 24 U/L (ref 11–51)

## 2023-06-26 LAB — RESP PANEL BY RT-PCR (RSV, FLU A&B, COVID)  RVPGX2
Influenza A by PCR: NEGATIVE
Influenza B by PCR: NEGATIVE
Resp Syncytial Virus by PCR: NEGATIVE
SARS Coronavirus 2 by RT PCR: NEGATIVE

## 2023-06-26 LAB — RAPID URINE DRUG SCREEN, HOSP PERFORMED
Amphetamines: NOT DETECTED
Barbiturates: NOT DETECTED
Benzodiazepines: NOT DETECTED
Cocaine: NOT DETECTED
Opiates: NOT DETECTED
Tetrahydrocannabinol: POSITIVE — AB

## 2023-06-26 LAB — HCG, SERUM, QUALITATIVE: Preg, Serum: NEGATIVE

## 2023-06-26 LAB — TROPONIN I (HIGH SENSITIVITY)
Troponin I (High Sensitivity): 3 ng/L (ref <18)
Troponin I (High Sensitivity): 3 ng/L (ref <18)

## 2023-06-26 MED ORDER — PANTOPRAZOLE SODIUM 40 MG IV SOLR
40.0000 mg | Freq: Two times a day (BID) | INTRAVENOUS | Status: DC
  Administered 2023-06-26 – 2023-06-30 (×8): 40 mg via INTRAVENOUS
  Filled 2023-06-26 (×8): qty 10

## 2023-06-26 MED ORDER — SODIUM CHLORIDE 0.9 % IV SOLN
8.0000 mg | Freq: Once | INTRAVENOUS | Status: AC
  Administered 2023-06-26: 8 mg via INTRAVENOUS
  Filled 2023-06-26: qty 4

## 2023-06-26 MED ORDER — SUCRALFATE 1 GM/10ML PO SUSP
1.0000 g | Freq: Three times a day (TID) | ORAL | Status: DC
  Administered 2023-06-26 – 2023-06-28 (×5): 1 g via ORAL
  Filled 2023-06-26 (×8): qty 10

## 2023-06-26 MED ORDER — ACETAMINOPHEN 325 MG PO TABS: 650.0000 mg | ORAL_TABLET | Freq: Four times a day (QID) | ORAL | Status: DC | PRN

## 2023-06-26 MED ORDER — MORPHINE SULFATE (PF) 2 MG/ML IV SOLN
2.0000 mg | Freq: Once | INTRAVENOUS | Status: AC
  Administered 2023-06-27: 2 mg via INTRAVENOUS
  Filled 2023-06-26: qty 1

## 2023-06-26 MED ORDER — ONDANSETRON HCL 4 MG/2ML IJ SOLN
4.0000 mg | Freq: Once | INTRAMUSCULAR | Status: AC
  Administered 2023-06-26: 4 mg via INTRAVENOUS
  Filled 2023-06-26: qty 2

## 2023-06-26 MED ORDER — HALOPERIDOL LACTATE 5 MG/ML IJ SOLN
2.0000 mg | Freq: Once | INTRAMUSCULAR | Status: DC
  Filled 2023-06-26: qty 1

## 2023-06-26 MED ORDER — OXYCODONE HCL 5 MG PO TABS
5.0000 mg | ORAL_TABLET | ORAL | Status: DC | PRN
  Administered 2023-06-26 – 2023-06-30 (×12): 5 mg via ORAL
  Filled 2023-06-26 (×13): qty 1

## 2023-06-26 MED ORDER — PANTOPRAZOLE SODIUM 40 MG PO TBEC: 40.0000 mg | DELAYED_RELEASE_TABLET | Freq: Every day | ORAL | Status: DC

## 2023-06-26 MED ORDER — LIDOCAINE 5 % EX PTCH
1.0000 | MEDICATED_PATCH | CUTANEOUS | Status: DC
Start: 2023-06-26 — End: 2023-06-30
  Administered 2023-06-26 – 2023-06-30 (×4): 1 via TRANSDERMAL
  Filled 2023-06-26 (×5): qty 1

## 2023-06-26 MED ORDER — LEVETIRACETAM 500 MG PO TABS
500.0000 mg | ORAL_TABLET | Freq: Two times a day (BID) | ORAL | Status: DC
  Filled 2023-06-26 (×2): qty 1

## 2023-06-26 MED ORDER — SERTRALINE HCL 100 MG PO TABS
100.0000 mg | ORAL_TABLET | Freq: Every day | ORAL | Status: DC
  Administered 2023-06-28 – 2023-06-30 (×3): 100 mg via ORAL
  Filled 2023-06-26 (×5): qty 1

## 2023-06-26 MED ORDER — ACETAMINOPHEN 650 MG RE SUPP: 650.0000 mg | Freq: Four times a day (QID) | RECTAL | Status: DC | PRN

## 2023-06-26 MED ORDER — TRAZODONE HCL 100 MG PO TABS
200.0000 mg | ORAL_TABLET | Freq: Every day | ORAL | Status: DC
  Administered 2023-06-27 – 2023-06-29 (×3): 200 mg via ORAL
  Filled 2023-06-26 (×3): qty 2

## 2023-06-26 MED ORDER — ENOXAPARIN SODIUM 40 MG/0.4ML IJ SOSY
40.0000 mg | PREFILLED_SYRINGE | INTRAMUSCULAR | Status: DC
  Administered 2023-06-26 – 2023-06-29 (×4): 40 mg via SUBCUTANEOUS
  Filled 2023-06-26 (×4): qty 0.4

## 2023-06-26 MED ORDER — MORPHINE SULFATE (PF) 4 MG/ML IV SOLN
4.0000 mg | Freq: Once | INTRAVENOUS | Status: AC
  Administered 2023-06-26: 4 mg via INTRAVENOUS
  Filled 2023-06-26: qty 1

## 2023-06-26 MED ORDER — DIAZEPAM 5 MG/ML IJ SOLN
5.0000 mg | Freq: Once | INTRAMUSCULAR | Status: AC
  Administered 2023-06-26: 5 mg via INTRAVENOUS
  Filled 2023-06-26: qty 2

## 2023-06-26 MED ORDER — DICYCLOMINE HCL 10 MG/ML IM SOLN
20.0000 mg | Freq: Once | INTRAMUSCULAR | Status: AC
  Administered 2023-06-26: 20 mg via INTRAMUSCULAR
  Filled 2023-06-26: qty 2

## 2023-06-26 MED ORDER — IOHEXOL 300 MG/ML  SOLN
100.0000 mL | Freq: Once | INTRAMUSCULAR | Status: AC | PRN
  Administered 2023-06-26: 100 mL via INTRAVENOUS

## 2023-06-26 MED ORDER — PROMETHAZINE HCL 25 MG PO TABS
12.5000 mg | ORAL_TABLET | Freq: Four times a day (QID) | ORAL | Status: DC | PRN
  Administered 2023-06-26: 12.5 mg via ORAL
  Filled 2023-06-26: qty 1

## 2023-06-26 MED ORDER — CAPSAICIN 0.075 % EX CREA
TOPICAL_CREAM | Freq: Two times a day (BID) | CUTANEOUS | Status: DC
  Filled 2023-06-26: qty 57

## 2023-06-26 MED ORDER — ARIPIPRAZOLE 5 MG PO TABS
5.0000 mg | ORAL_TABLET | Freq: Every day | ORAL | Status: DC
  Administered 2023-06-29 – 2023-06-30 (×2): 5 mg via ORAL
  Filled 2023-06-26 (×5): qty 1

## 2023-06-26 MED ORDER — IOHEXOL 300 MG/ML  SOLN
75.0000 mL | Freq: Once | INTRAMUSCULAR | Status: AC | PRN
  Administered 2023-06-26: 75 mL via INTRAVENOUS

## 2023-06-26 MED ORDER — ONDANSETRON 4 MG PO TBDP
4.0000 mg | ORAL_TABLET | Freq: Once | ORAL | Status: AC | PRN
  Administered 2023-06-26: 4 mg via ORAL
  Filled 2023-06-26: qty 1

## 2023-06-26 MED ORDER — LACTATED RINGERS IV BOLUS
1000.0000 mL | Freq: Once | INTRAVENOUS | Status: AC
  Administered 2023-06-26: 1000 mL via INTRAVENOUS

## 2023-06-26 NOTE — ED Provider Notes (Signed)
[place] [facility] DEPARTMENT AT [facility] Provider Note   CSN: [id number] Arrival date & time: 06/26/23  0419     History  Chief Complaint  Patient presents with   Emesis   Chest Pain    Marie Myers is a 32 y.o. female with history of seizures, thrombocytopenia, and gastric bypass presents the ED today for chest pain.  Patient reports that she was admitted to the hospital on 06/03/2023 for necrotizing pneumonia. She finished her antibiotic for the pneumonia yesterday. Since then, she has been having pain in her chest and right ribs as well as periumbilical pain with N/V/D. She reports CBD gummy use and hemp lozenges to try to help with her nausea without relief. She states that she has not been smoking marijuana since she was hospitalized last. Endorses subjective fevers but denies sick contact.  No additional complaints or concerns at this time.    Home Medications Prior to Admission medications   Medication Sig Start Date End Date Taking? Authorizing Provider  acetaminophen (TYLENOL) 325 MG tablet Take 2 tablets (650 mg total) by mouth every 6 (six) hours as needed for mild pain (pain score 1-3) or fever. 06/08/23   Standley Brooking, MD  amoxicillin-clavulanate (AUGMENTIN) 875-125 MG tablet Take 1 tablet by mouth 2 (two) times daily. For necrotizing pneumonia 06/08/23   Standley Brooking, MD  ARIPiprazole (ABILIFY) 5 MG tablet Take 1 tablet (5 mg total) by mouth daily. 06/01/23   Shanna Cisco, NP  cyanocobalamin (VITAMIN B12) 500 MCG tablet Take 1 tablet (500 mcg total) by mouth daily. 06/09/23   Standley Brooking, MD  folic acid (FOLVITE) 1 MG tablet Take 1 tablet (1 mg total) by mouth daily. 06/09/23   Standley Brooking, MD  levETIRAcetam (KEPPRA) 500 MG tablet Take 1 tablet (500 mg total) by mouth 2 (two) times daily. 10/04/22   Azucena Fallen, MD  Multiple Vitamin (MULTIVITAMIN WITH MINERALS) TABS tablet Take 1 tablet by mouth daily. 06/09/23    Standley Brooking, MD  oxyCODONE (OXY IR/ROXICODONE) 5 MG immediate release tablet Take 1 tablet (5 mg total) by mouth every 6 (six) hours as needed for moderate pain (pain score 4-6). 06/08/23   Standley Brooking, MD  pantoprazole (PROTONIX) 40 MG tablet Take 1 tablet (40 mg total) by mouth daily. 06/08/23   Standley Brooking, MD  thiamine (VITAMIN B-1) 100 MG tablet Take 1 tablet (100 mg total) by mouth daily. 06/09/23   Standley Brooking, MD  traZODone (DESYREL) 100 MG tablet Take 2 tablets (200 mg total) by mouth at bedtime. 06/01/23   Shanna Cisco, NP      Allergies    Zithromax [azithromycin]    Review of Systems   Review of Systems  Cardiovascular:  Positive for chest pain.  Gastrointestinal:  Positive for vomiting.  All other systems reviewed and are negative.   Physical Exam Updated Vital Signs BP (!) 129/91   Pulse 89   Temp 98.5 F (36.9 C)   Resp 17   Ht 5\' 6"  (1.676 m)   Wt 85.3 kg   LMP 06/04/2023 (Approximate)   SpO2 98%   BMI 30.34 kg/m  Physical Exam Vitals and nursing note reviewed.  Constitutional:      Appearance: Normal appearance. She is ill-appearing.  HENT:     Head: Normocephalic and atraumatic.     Mouth/Throat:     Mouth: Mucous membranes are moist.  Eyes:     Conjunctiva/sclera:  Conjunctivae normal.     Pupils: Pupils are equal, round, and reactive to light.  Cardiovascular:     Rate and Rhythm: Normal rate and regular rhythm.     Pulses: Normal pulses.     Heart sounds: Normal heart sounds.  Pulmonary:     Effort: Pulmonary effort is normal.     Breath sounds: Normal breath sounds.  Abdominal:     Palpations: Abdomen is soft.     Tenderness: There is abdominal tenderness.     Comments: Periumbilical tenderness  Skin:    General: Skin is warm and dry.     Findings: No rash.  Neurological:     General: No focal deficit present.     Mental Status: She is alert.     Motor: No weakness.  Psychiatric:        Mood and Affect:  Mood normal.        Behavior: Behavior normal.    ED Results / Procedures / Treatments   Labs (all labs ordered are listed, but only abnormal results are displayed) Labs Reviewed  COMPREHENSIVE METABOLIC PANEL - Abnormal; Notable for the following components:      Result Value   CO2 21 (*)    Glucose, Bld 150 (*)    BUN <5 (*)    Anion gap 16 (*)    All other components within normal limits  CBC - Abnormal; Notable for the following components:   WBC 14.3 (*)    RBC 3.72 (*)    MCV 108.1 (*)    MCH 36.0 (*)    RDW 17.3 (*)    All other components within normal limits  URINALYSIS, ROUTINE W REFLEX MICROSCOPIC - Abnormal; Notable for the following components:   APPearance HAZY (*)    Ketones, ur 80 (*)    Protein, ur 100 (*)    Leukocytes,Ua TRACE (*)    All other components within normal limits  RAPID URINE DRUG SCREEN, HOSP PERFORMED - Abnormal; Notable for the following components:   Tetrahydrocannabinol POSITIVE (*)    All other components within normal limits  RESP PANEL BY RT-PCR (RSV, FLU A&B, COVID)  RVPGX2  LIPASE, BLOOD  HCG, SERUM, QUALITATIVE  TROPONIN I (HIGH SENSITIVITY)  TROPONIN I (HIGH SENSITIVITY)    EKG None  Radiology CT ABDOMEN PELVIS W CONTRAST  Result Date: 06/26/2023 CLINICAL DATA:  Abdominal pain. EXAM: CT ABDOMEN AND PELVIS WITH CONTRAST TECHNIQUE: Multidetector CT imaging of the abdomen and pelvis was performed using the standard protocol following bolus administration of intravenous contrast. RADIATION DOSE REDUCTION: This exam was performed according to the departmental dose-optimization program which includes automated exposure control, adjustment of the mA and/or kV according to patient size and/or use of iterative reconstruction technique. CONTRAST:  OMNIPAQUE IOHEXOL 300 MG/ML  SOLN COMPARISON:  Multiple prior imaging studies. The most recent CT scan is 06/03/2023 FINDINGS: Lower chest: The lung bases are clear of acute process. No  pleural effusion or pulmonary lesions. The heart is normal in size. No pericardial effusion. Stable surgical changes along the GE junction and persistent small hiatal hernia. Hepatobiliary: No hepatic lesions or intrahepatic biliary dilatation. Stable hepatic steatosis. The gallbladder is surgically absent. Stable mild associated common bile duct dilatation. Pancreas: Unremarkable. No pancreatic ductal dilatation or surrounding inflammatory changes. Spleen: Normal in size without focal abnormality. Adrenals/Urinary Tract: The adrenal glands and kidneys are unremarkable. Contrast in the collecting systems and bladder from earlier chest CT. Stomach/Bowel: Stable surgical changes from gastric bypass surgery.  The small bowel and colon are grossly normal. No obstructive findings. Vascular/Lymphatic: The aorta is normal in caliber. No dissection. The branch vessels are patent. The major venous structures are patent. No mesenteric or retroperitoneal mass or adenopathy. Small scattered lymph nodes are noted. Reproductive: No significant findings. Other: No pelvic mass or adenopathy. No free pelvic fluid collections. No inguinal mass or adenopathy. No abdominal wall hernia or subcutaneous lesions. Musculoskeletal: Stable left hip hardware and stable advanced bilateral hip joint degenerative changes for age. IMPRESSION: 1. No acute abdominal/pelvic findings, mass lesions or adenopathy. 2. Stable surgical changes from gastric bypass surgery and persistent small hiatal hernia. 3. Status post cholecystectomy with stable mild associated common bile duct dilatation. 4. Stable advanced bilateral hip joint degenerative changes for age. Electronically Signed   By: Rudie Meyer M.D.   On: 06/26/2023 14:54   CT Chest W Contrast  Result Date: 06/26/2023 CLINICAL DATA:  Right-sided chest pain and shortness of breath. Cavitary pneumonia. EXAM: CT CHEST WITH CONTRAST TECHNIQUE: Multidetector CT imaging of the chest was performed  during intravenous contrast administration. RADIATION DOSE REDUCTION: This exam was performed according to the departmental dose-optimization program which includes automated exposure control, adjustment of the mA and/or kV according to patient size and/or use of iterative reconstruction technique. CONTRAST:  75mL OMNIPAQUE IOHEXOL 300 MG/ML  SOLN COMPARISON:  Chest CTA on 06/03/2023 FINDINGS: Cardiovascular:  No acute findings. Mediastinum/Nodes: No masses or pathologically enlarged lymph nodes identified. Lungs/Pleura: Airspace disease with central cavitation in the right upper lobe has nearly completely resolved since previous study. Subpulmonic air-fluid collection in the lateral right lower lobe has also resolved. Mild residual atelectasis or scarring noted. No new areas of pulmonary opacity are seen. No evidence of pleural effusion. Upper Abdomen: Previous gastric bypass surgery and small hiatal hernia again noted. Significantly decreased hepatic steatosis since prior study. Musculoskeletal:  No suspicious bone lesions. IMPRESSION: Near complete resolution of right upper lobe airspace disease with central cavitation since prior study. Resolution of subpulmonic air-fluid collection in lateral right lower lobe. No new or progressive disease. Stable small hiatal hernia and previous gastric bypass surgery. Significantly decreased hepatic steatosis since prior study. Electronically Signed   By: Danae Orleans M.D.   On: 06/26/2023 11:46   DG Chest 2 View  Result Date: 06/26/2023 CLINICAL DATA:  Chest pain. EXAM: CHEST - 2 VIEW COMPARISON:  06/03/2023 FINDINGS: Low volume film. The lungs are clear without focal pneumonia, edema, pneumothorax or pleural effusion. The cardiopericardial silhouette is within normal limits for size. No acute bony abnormality. Telemetry leads overlie the chest. IMPRESSION: Low volume film without acute cardiopulmonary findings. Electronically Signed   By: Kennith Center M.D.   On:  06/26/2023 05:40    Procedures Procedures: not indicated.   Medications Ordered in ED Medications  lidocaine (LIDODERM) 5 % 1 patch (1 patch Transdermal Patch Applied 06/26/23 1138)  dicyclomine (BENTYL) injection 20 mg (has no administration in time range)  ondansetron (ZOFRAN-ODT) disintegrating tablet 4 mg (4 mg Oral Given 06/26/23 0508)  lactated ringers bolus 1,000 mL (0 mLs Intravenous Stopped 06/26/23 1124)  ondansetron (ZOFRAN) injection 4 mg (4 mg Intravenous Given 06/26/23 1013)  morphine (PF) 4 MG/ML injection 4 mg (4 mg Intravenous Given 06/26/23 1014)  iohexol (OMNIPAQUE) 300 MG/ML solution 75 mL (75 mLs Intravenous Contrast Given 06/26/23 1100)  morphine (PF) 4 MG/ML injection 4 mg (4 mg Intravenous Given 06/26/23 1224)  ondansetron (ZOFRAN) 8 mg in sodium chloride 0.9 % 50 mL IVPB (0 mg  Intravenous Stopped 06/26/23 1336)  iohexol (OMNIPAQUE) 300 MG/ML solution 100 mL (100 mLs Intravenous Contrast Given 06/26/23 1424)    ED Course/ Medical Decision Making/ A&P                                 Medical Decision Making Amount and/or Complexity of Data Reviewed Labs: ordered. Radiology: ordered.  Risk Prescription drug management.   This patient presents to the ED for concern of chest pain and N/V/D, this involves an extensive number of treatment options, and is a complaint that carries with it a high risk of complications and morbidity.   Differential diagnosis includes: ACS, pleuritic chest pain, costochondritis, pneumonia, gastroenteritis, gastritis, COVID, Flu, RSV, bowel obstruction, IBS, IBD, hyperemesis cannabinoid, etc   Comorbidities  See HPI above   Additional History  Additional history obtained from prior hospitalization   Cardiac Monitoring / EKG  The patient was maintained on a cardiac monitor.  I personally viewed and interpreted the cardiac monitored which showed: sinus arrhythmia with a heart rate of 76 bpm.   Lab Tests  I ordered and personally  interpreted labs.  The pertinent results include:   Slightly elevated white count CBC otherwise within normal limits UDS positive for tetrahydrocannabinol UA is within normal limits CMP shows elevated anion gap, otherwise is within normal limits Negative pregnancy test Negative troponin   Imaging Studies  I ordered imaging studies including CT chest and abdomen  I independently visualized and interpreted imaging which showed:  CT chest showed improvement of pneumonia from prior imaging CT abdomen showed no acute abdominal/pelvic findings I agree with the radiologist interpretation   Consultations  I requested consultation with Dr. Berlinda Last with [facility],  and discussed lab and imaging findings as well as pertinent plan - they recommend: Admission for further evaluation and management of symptoms.   Problem List / ED Course / Critical Interventions / Medication Management  Chest pain with intractable nausea and vomiting I ordered medications including: Morphine, Zofran, and LRs for pain and nausea I did not order droperidol due to prolonged QT  Reevaluation of the patient after these medicines showed that the patient stayed the same I have reviewed the patients home medicines and have made adjustments as needed   Social Determinants of Health  Alcohol use   Test / Admission - Considered  Discussed findings with patient. She is agreeable for admission.        Final Clinical Impression(s) / ED Diagnoses Final diagnoses:  Intractable nausea and vomiting    Rx / DC Orders ED Discharge Orders     None         Maxwell Marion, PA-C 06/26/23 1523    Derwood Kaplan, MD 06/27/23 [phone]

## 2023-06-26 NOTE — ED Notes (Signed)
Pt provided with sprite for PO challenge.

## 2023-06-26 NOTE — ED Notes (Signed)
Pt c/o severe nausea and pain

## 2023-06-26 NOTE — Progress Notes (Signed)
5mg  of 10mg  dispensed dose of valium wasted in stericycle and witnessed by Gunnar Fusi, Charity fundraiser

## 2023-06-26 NOTE — H&P (Signed)
History and Physical    Marie Myers ZOX:[id number] DOB: [birth year] DOA: 06/26/2023  PCP: Delfino Lovett, FNP   Chief Complaint: n/v  HPI: Marie Myers is a 32 y.o. female with medical history significant of anxiety, depression, status post gastric bypass, marijuana, alcohol usage who presents emergency department due to epigastric pain and dyspepsia.  Patient was recently treated for pneumonia with Augmentin in October.  She finished her last dose yesterday and has since had worsening epigastric pain nausea and vomiting.  She was taking CBD Gummies to assist with the nausea without relief.  She has not smoked marijuana in about 2 weeks.  She denies any infectious symptoms.  She presented to the ER where she was found to be afebrile hemodynamically stable.  Labs were obtained on presentation which revealed WBC 14.3, hemoglobin 13.4, platelets 398, sodium 138, creatinine 0.59, lipase 24, pregnancy test negative, urinalysis negative for infection, UDS positive for THC, troponins within normal limits.  Patient underwent a CT abdomen pelvis which showed no acute findings and stable surgical changes from gastric bypass surgery.  She also has evidence of a cholecystectomy.  CT chest demonstrated complete resolution of pneumonia.  X-ray showed low lung volumes.  Patient was admitted for further workup.    Of note she was recently admitted on 10/10.  During this admission she was found to have concern for necrotizing pneumonia and was treated with Unasyn.  She also had nausea vomiting at that time.  Surgery was consulted and upper GI series was performed which did not show any leakage or perforation.  She was planning to see them in outpatient setting.  Review of Systems: Review of Systems  Constitutional:  Negative for chills and fever.  HENT: Negative.    Eyes: Negative.   Respiratory: Negative.    Cardiovascular: Negative.   Gastrointestinal:  Positive for abdominal pain, heartburn, nausea and  vomiting.  Genitourinary: Negative.   Musculoskeletal: Negative.   Skin: Negative.   Neurological: Negative.   Endo/Heme/Allergies: Negative.   Psychiatric/Behavioral: Negative.       As per HPI otherwise 10 point review of systems negative.   Allergies  Allergen Reactions   Zithromax [Azithromycin] Hives    Past Medical History:  Diagnosis Date   Anxiety    Depression    H/O gastric bypass    Insomnia    Marijuana use 01/08/2021   Moderate alcohol use disorder (HCC) 01/08/2021    Past Surgical History:  Procedure Laterality Date   CHOLECYSTECTOMY     GASTRIC BYPASS     GASTRIC BYPASS     LAPAROSCOPY N/A 08/11/2019   Procedure: LAPAROSCOPY DIAGNOSTIC;  Surgeon: Harriette Bouillon, MD;  Location: [facility] OR;  Service: General;  Laterality: N/A;     reports that she has never smoked. She has never used smokeless tobacco. She reports current alcohol use of about 1.0 standard drink of alcohol per week. She reports current drug use. Drug: Marijuana.  Family History  Problem Relation Age of Onset   Diabetes Mother    Diabetes Father     Prior to Admission medications   Medication Sig Start Date End Date Taking? Authorizing Provider  acetaminophen (TYLENOL) 325 MG tablet Take 2 tablets (650 mg total) by mouth every 6 (six) hours as needed for mild pain (pain score 1-3) or fever. 06/08/23   Standley Brooking, MD  amoxicillin-clavulanate (AUGMENTIN) 875-125 MG tablet Take 1 tablet by mouth 2 (two) times daily. For necrotizing pneumonia 06/08/23   Standley Brooking, MD  ARIPiprazole (ABILIFY) 5 MG tablet Take 1 tablet (5 mg total) by mouth daily. 06/01/23   Shanna Cisco, NP  cyanocobalamin (VITAMIN B12) 500 MCG tablet Take 1 tablet (500 mcg total) by mouth daily. 06/09/23   Standley Brooking, MD  folic acid (FOLVITE) 1 MG tablet Take 1 tablet (1 mg total) by mouth daily. 06/09/23   Standley Brooking, MD  levETIRAcetam (KEPPRA) 500 MG tablet Take 1 tablet (500 mg total) by  mouth 2 (two) times daily. 10/04/22   Azucena Fallen, MD  Multiple Vitamin (MULTIVITAMIN WITH MINERALS) TABS tablet Take 1 tablet by mouth daily. 06/09/23   Standley Brooking, MD  oxyCODONE (OXY IR/ROXICODONE) 5 MG immediate release tablet Take 1 tablet (5 mg total) by mouth every 6 (six) hours as needed for moderate pain (pain score 4-6). 06/08/23   Standley Brooking, MD  pantoprazole (PROTONIX) 40 MG tablet Take 1 tablet (40 mg total) by mouth daily. 06/08/23   Standley Brooking, MD  thiamine (VITAMIN B-1) 100 MG tablet Take 1 tablet (100 mg total) by mouth daily. 06/09/23   Standley Brooking, MD  traZODone (DESYREL) 100 MG tablet Take 2 tablets (200 mg total) by mouth at bedtime. 06/01/23   Shanna Cisco, NP    Physical Exam: Vitals:   06/26/23 1254 06/26/23 1300 06/26/23 1315 06/26/23 1400  BP:   131/89 (!) 129/91  Pulse:  87 98 89  Resp:   18 17  Temp: 98.5 F (36.9 C)     TempSrc:      SpO2:  96% 97% 98%  Weight:      Height:       Physical Exam Vitals reviewed.  Constitutional:      Appearance: She is normal weight.  HENT:     Head: Normocephalic.  Cardiovascular:     Rate and Rhythm: Normal rate and regular rhythm.     Heart sounds: Normal heart sounds.  Pulmonary:     Effort: Pulmonary effort is normal.  Abdominal:     General: Bowel sounds are normal.     Palpations: Abdomen is soft.  Musculoskeletal:        General: Normal range of motion.     Cervical back: Normal range of motion.  Skin:    General: Skin is warm.     Capillary Refill: Capillary refill takes less than 2 seconds.  Neurological:     General: No focal deficit present.     Mental Status: She is alert.        Labs on Admission: I have personally reviewed the patients's labs and imaging studies.  Assessment/Plan Principal Problem:   Intractable nausea and vomiting   # Intractable nausea vomiting - Patient recently treated for pneumonia found to have persistent nausea vomiting  in setting of prior gastric bypass and cannabis usage - Differential diagnosis includes esophagitis, gastritis, cannabis hyperemesis syndrome, marginal ulcer, peptic ulcer disease  Plan: Supportive care with Phenergan Twice daily PPI and Carafate Test H. pylori If patient fails to improve over the weekend may need EGD on Monday to assess for marginal ulcer  # History of seizure-continue Keppra  # Insomnia-continue trazodone  # Alcohol usage-continue to monitor and encourage cessation   Admission status: Observation Med-Surg  Certification: The appropriate patient status for this patient is OBSERVATION. Observation status is judged to be reasonable and necessary in order to provide the required intensity of service to ensure the patient's safety. The patient's presenting symptoms, physical exam  findings, and initial radiographic and laboratory data in the context of their medical condition is felt to place them at decreased risk for further clinical deterioration. Furthermore, it is anticipated that the patient will be medically stable for discharge from the hospital within 2 midnights of admission.     Alan Mulder MD [facility] If 7PM-7AM, please contact night-coverage [email or web address]  06/26/2023, 3:41 PM

## 2023-06-26 NOTE — ED Notes (Signed)
ED TO INPATIENT HANDOFF REPORT  ED Nurse Name and Phone #: Robbi Garter Name/Age/Gender Marie Myers 32 y.o. female Room/Bed: WA22/WA22  Code Status   Code Status: Prior  Home/SNF/Other Home Patient oriented to: self, place, time, and situation Is this baseline? Yes   Triage Complete: Triage complete  Chief Complaint Intractable nausea and vomiting [R11.2]  Triage Note Pt was d/c recently with pneumonia, states symptoms has got worse in the past week. Having chest pain with right lateral side pain. Has sob with abd pain. 10/10 Has diarrhea since leaving the hospital. Finished her antibiotic yesterday. Has taken tylenol and albuterol with no relief.    Allergies Allergies  Allergen Reactions   Zithromax [Azithromycin] Hives    Level of Care/Admitting Diagnosis ED Disposition     ED Disposition  Admit   Condition  --   Comment  Hospital Area: [facility] [[id number]]  Level of Care: Med-Surg [16]  May place patient in observation at [facility] or [facility] if equivalent level of care is available:: No  Covid Evaluation: Asymptomatic - no recent exposure (last 10 days) testing not required  Diagnosis: Intractable nausea and vomiting [720114]  Admitting Physician: Alan Mulder [[id number]]  Attending Physician: Alan Mulder [[id number]]          B Medical/Surgery History Past Medical History:  Diagnosis Date   Anxiety    Depression    H/O gastric bypass    Insomnia    Marijuana use 01/08/2021   Moderate alcohol use disorder (HCC) 01/08/2021   Past Surgical History:  Procedure Laterality Date   CHOLECYSTECTOMY     GASTRIC BYPASS     GASTRIC BYPASS     LAPAROSCOPY N/A 08/11/2019   Procedure: LAPAROSCOPY DIAGNOSTIC;  Surgeon: Harriette Bouillon, MD;  Location: [facility] OR;  Service: General;  Laterality: N/A;     A IV Location/Drains/Wounds Patient Lines/Drains/Airways Status     Active Line/Drains/Airways     Name Placement date Placement  time Site Days   Peripheral IV 06/26/23 20 G Anterior;Right;Upper Arm 06/26/23  1006  Arm  less than 1            Intake/Output Last 24 hours  Intake/Output Summary (Last 24 hours) at 06/26/2023 1518 Last data filed at 06/26/2023 1336 Gross per 24 hour  Intake 1054 ml  Output --  Net 1054 ml    Labs/Imaging Results for orders placed or performed during the hospital encounter of 06/26/23 (from the past 48 hour(s))  Lipase, blood     Status: None   Collection Time: 06/26/23  5:20 AM  Result Value Ref Range   Lipase 24 11 - 51 U/L    Comment: Performed at [facility], [address] [place], [place] [address]  Comprehensive metabolic panel     Status: Abnormal   Collection Time: 06/26/23  5:20 AM  Result Value Ref Range   Sodium 138 135 - 145 mmol/L   Potassium 3.6 3.5 - 5.1 mmol/L   Chloride 101 98 - 111 mmol/L   CO2 21 (L) 22 - 32 mmol/L   Glucose, Bld 150 (H) 70 - 99 mg/dL    Comment: Glucose reference range applies only to samples taken after fasting for at least 8 hours.   BUN <5 (L) 6 - 20 mg/dL   Creatinine, Ser 3.66 0.44 - 1.00 mg/dL   Calcium 9.9 8.9 - 44.0 mg/dL   Total Protein 8.1 6.5 - 8.1 g/dL   Albumin 4.2 3.5 -  5.0 g/dL   AST 16 15 - 41 U/L   ALT 15 0 - 44 U/L   Alkaline Phosphatase 79 38 - 126 U/L   Total Bilirubin 0.9 0.3 - 1.2 mg/dL   GFR, Estimated >40 >98 mL/min    Comment: (NOTE) Calculated using the CKD-EPI Creatinine Equation (2021)    Anion gap 16 (H) 5 - 15    Comment: Performed at [facility], [address] [place], [place] [address]  CBC     Status: Abnormal   Collection Time: 06/26/23  5:20 AM  Result Value Ref Range   WBC 14.3 (H) 4.0 - 10.5 K/uL   RBC 3.72 (L) 3.87 - 5.11 MIL/uL   Hemoglobin 13.4 12.0 - 15.0 g/dL   HCT 78.2 95.6 - 21.3 %   MCV 108.1 (H) 80.0 - 100.0 fL   MCH 36.0 (H) 26.0 - 34.0 pg   MCHC 33.3 30.0 - 36.0 g/dL   RDW 08.6 (H) 57.8 - 46.9 %   Platelets 398 150 - 400 K/uL    nRBC 0.0 0.0 - 0.2 %    Comment: Performed at [facility], [address], [place], [place] [address]  hCG, serum, qualitative     Status: None   Collection Time: 06/26/23  5:20 AM  Result Value Ref Range   Preg, Serum NEGATIVE NEGATIVE    Comment:        THE SENSITIVITY OF THIS METHODOLOGY IS >10 mIU/mL. Performed at [facility], [address] [place], [place] [address]   Troponin I (High Sensitivity)     Status: None   Collection Time: 06/26/23  5:20 AM  Result Value Ref Range   Troponin I (High Sensitivity) 3 <18 ng/L    Comment: (NOTE) Elevated high sensitivity troponin I (hsTnI) values and significant  changes across serial measurements may suggest ACS but many other  chronic and acute conditions are known to elevate hsTnI results.  Refer to the "Links" section for chest pain algorithms and additional  guidance. Performed at [facility], [address] [place], [place] [address]   Urinalysis, Routine w reflex microscopic -Urine, Clean Catch     Status: Abnormal   Collection Time: 06/26/23  8:48 AM  Result Value Ref Range   Color, Urine YELLOW YELLOW   APPearance HAZY (A) CLEAR   Specific Gravity, Urine 1.024 1.005 - 1.030   pH 5.0 5.0 - 8.0   Glucose, UA NEGATIVE NEGATIVE mg/dL   Hgb urine dipstick NEGATIVE NEGATIVE   Bilirubin Urine NEGATIVE NEGATIVE   Ketones, ur 80 (A) NEGATIVE mg/dL   Protein, ur 272 (A) NEGATIVE mg/dL   Nitrite NEGATIVE NEGATIVE   Leukocytes,Ua TRACE (A) NEGATIVE   RBC / HPF 0-5 0 - 5 RBC/hpf   WBC, UA 6-10 0 - 5 WBC/hpf   Bacteria, UA NONE SEEN NONE SEEN   Squamous Epithelial / HPF 6-10 0 - 5 /HPF   Mucus PRESENT     Comment: Performed at [facility], [address], [place], [place] [address]  Rapid urine drug screen (hospital performed)     Status: Abnormal   Collection Time: 06/26/23  8:48 AM  Result Value Ref Range   Opiates NONE DETECTED NONE DETECTED    Cocaine NONE DETECTED NONE DETECTED   Benzodiazepines NONE DETECTED NONE DETECTED   Amphetamines NONE DETECTED NONE DETECTED   Tetrahydrocannabinol POSITIVE (A) NONE DETECTED   Barbiturates NONE DETECTED NONE DETECTED  Comment: (NOTE) DRUG SCREEN FOR MEDICAL PURPOSES ONLY.  IF CONFIRMATION IS NEEDED FOR ANY PURPOSE, NOTIFY LAB WITHIN 5 DAYS.  LOWEST DETECTABLE LIMITS FOR URINE DRUG SCREEN Drug Class                     Cutoff (ng/mL) Amphetamine and metabolites    1000 Barbiturate and metabolites    200 Benzodiazepine                 200 Opiates and metabolites        300 Cocaine and metabolites        300 THC                            50 Performed at [facility], [address] [place], [place] [address]   Troponin I (High Sensitivity)     Status: None   Collection Time: 06/26/23 11:30 AM  Result Value Ref Range   Troponin I (High Sensitivity) 3 <18 ng/L    Comment: (NOTE) Elevated high sensitivity troponin I (hsTnI) values and significant  changes across serial measurements may suggest ACS but many other  chronic and acute conditions are known to elevate hsTnI results.  Refer to the "Links" section for chest pain algorithms and additional  guidance. Performed at [facility], [address], [place], [place] [address]   Resp panel by RT-PCR (RSV, Flu A&B, Covid) Anterior Nasal Swab     Status: None   Collection Time: 06/26/23 11:30 AM   Specimen: Anterior Nasal Swab  Result Value Ref Range   SARS Coronavirus 2 by RT PCR NEGATIVE NEGATIVE    Comment: (NOTE) SARS-CoV-2 target nucleic acids are NOT DETECTED.  The SARS-CoV-2 RNA is generally detectable in upper respiratory specimens during the acute phase of infection. The lowest concentration of SARS-CoV-2 viral copies this assay can detect is 138 copies/mL. A negative result does not preclude SARS-Cov-2 infection and should not be used as the sole basis for treatment or other  patient management decisions. A negative result may occur with  improper specimen collection/handling, submission of specimen other than nasopharyngeal swab, presence of viral mutation(s) within the areas targeted by this assay, and inadequate number of viral copies(<138 copies/mL). A negative result must be combined with clinical observations, patient history, and epidemiological information. The expected result is Negative.  Fact Sheet for Patients:  BloggerCourse.com  Fact Sheet for Healthcare Providers:  SeriousBroker.it  This test is no t yet approved or cleared by the [place] FDA and  has been authorized for detection and/or diagnosis of SARS-CoV-2 by FDA under an Emergency Use Authorization (EUA). This EUA will remain  in effect (meaning this test can be used) for the duration of the COVID-19 declaration under Section 564(b)(1) of the Act, 21 U.S.C.section 360bbb-3(b)(1), unless the authorization is terminated  or revoked sooner.       Influenza A by PCR NEGATIVE NEGATIVE   Influenza B by PCR NEGATIVE NEGATIVE    Comment: (NOTE) The Xpert Xpress SARS-CoV-2/FLU/RSV plus assay is intended as an aid in the diagnosis of influenza from Nasopharyngeal swab specimens and should not be used as a sole basis for treatment. Nasal washings and aspirates are unacceptable for Xpert Xpress SARS-CoV-2/FLU/RSV testing.  Fact Sheet for Patients: BloggerCourse.com  Fact Sheet for Healthcare Providers: SeriousBroker.it  This test is not yet approved or cleared by the [place] and has been authorized for  detection and/or diagnosis of SARS-CoV-2 by FDA under an Emergency Use Authorization (EUA). This EUA will remain in effect (meaning this test can be used) for the duration of the COVID-19 declaration under Section 564(b)(1) of the Act, 21 U.S.C. section 360bbb-3(b)(1), unless  the authorization is terminated or revoked.     Resp Syncytial Virus by PCR NEGATIVE NEGATIVE    Comment: (NOTE) Fact Sheet for Patients: BloggerCourse.com  Fact Sheet for Healthcare Providers: SeriousBroker.it  This test is not yet approved or cleared by the [place] FDA and has been authorized for detection and/or diagnosis of SARS-CoV-2 by FDA under an Emergency Use Authorization (EUA). This EUA will remain in effect (meaning this test can be used) for the duration of the COVID-19 declaration under Section 564(b)(1) of the Act, 21 U.S.C. section 360bbb-3(b)(1), unless the authorization is terminated or revoked.  Performed at [facility], [address], [place], [place] [address]    CT ABDOMEN PELVIS W CONTRAST  Result Date: 06/26/2023 CLINICAL DATA:  Abdominal pain. EXAM: CT ABDOMEN AND PELVIS WITH CONTRAST TECHNIQUE: Multidetector CT imaging of the abdomen and pelvis was performed using the standard protocol following bolus administration of intravenous contrast. RADIATION DOSE REDUCTION: This exam was performed according to the departmental dose-optimization program which includes automated exposure control, adjustment of the mA and/or kV according to patient size and/or use of iterative reconstruction technique. CONTRAST:  OMNIPAQUE IOHEXOL 300 MG/ML  SOLN COMPARISON:  Multiple prior imaging studies. The most recent CT scan is 06/03/2023 FINDINGS: Lower chest: The lung bases are clear of acute process. No pleural effusion or pulmonary lesions. The heart is normal in size. No pericardial effusion. Stable surgical changes along the GE junction and persistent small hiatal hernia. Hepatobiliary: No hepatic lesions or intrahepatic biliary dilatation. Stable hepatic steatosis. The gallbladder is surgically absent. Stable mild associated common bile duct dilatation. Pancreas: Unremarkable. No pancreatic ductal  dilatation or surrounding inflammatory changes. Spleen: Normal in size without focal abnormality. Adrenals/Urinary Tract: The adrenal glands and kidneys are unremarkable. Contrast in the collecting systems and bladder from earlier chest CT. Stomach/Bowel: Stable surgical changes from gastric bypass surgery. The small bowel and colon are grossly normal. No obstructive findings. Vascular/Lymphatic: The aorta is normal in caliber. No dissection. The branch vessels are patent. The major venous structures are patent. No mesenteric or retroperitoneal mass or adenopathy. Small scattered lymph nodes are noted. Reproductive: No significant findings. Other: No pelvic mass or adenopathy. No free pelvic fluid collections. No inguinal mass or adenopathy. No abdominal wall hernia or subcutaneous lesions. Musculoskeletal: Stable left hip hardware and stable advanced bilateral hip joint degenerative changes for age. IMPRESSION: 1. No acute abdominal/pelvic findings, mass lesions or adenopathy. 2. Stable surgical changes from gastric bypass surgery and persistent small hiatal hernia. 3. Status post cholecystectomy with stable mild associated common bile duct dilatation. 4. Stable advanced bilateral hip joint degenerative changes for age. Electronically Signed   By: Rudie Meyer M.D.   On: 06/26/2023 14:54   CT Chest W Contrast  Result Date: 06/26/2023 CLINICAL DATA:  Right-sided chest pain and shortness of breath. Cavitary pneumonia. EXAM: CT CHEST WITH CONTRAST TECHNIQUE: Multidetector CT imaging of the chest was performed during intravenous contrast administration. RADIATION DOSE REDUCTION: This exam was performed according to the departmental dose-optimization program which includes automated exposure control, adjustment of the mA and/or kV according to patient size and/or use of iterative reconstruction technique. CONTRAST:  75mL OMNIPAQUE IOHEXOL 300 MG/ML  SOLN COMPARISON:  Chest CTA  on 06/03/2023 FINDINGS:  Cardiovascular:  No acute findings. Mediastinum/Nodes: No masses or pathologically enlarged lymph nodes identified. Lungs/Pleura: Airspace disease with central cavitation in the right upper lobe has nearly completely resolved since previous study. Subpulmonic air-fluid collection in the lateral right lower lobe has also resolved. Mild residual atelectasis or scarring noted. No new areas of pulmonary opacity are seen. No evidence of pleural effusion. Upper Abdomen: Previous gastric bypass surgery and small hiatal hernia again noted. Significantly decreased hepatic steatosis since prior study. Musculoskeletal:  No suspicious bone lesions. IMPRESSION: Near complete resolution of right upper lobe airspace disease with central cavitation since prior study. Resolution of subpulmonic air-fluid collection in lateral right lower lobe. No new or progressive disease. Stable small hiatal hernia and previous gastric bypass surgery. Significantly decreased hepatic steatosis since prior study. Electronically Signed   By: Danae Orleans M.D.   On: 06/26/2023 11:46   DG Chest 2 View  Result Date: 06/26/2023 CLINICAL DATA:  Chest pain. EXAM: CHEST - 2 VIEW COMPARISON:  06/03/2023 FINDINGS: Low volume film. The lungs are clear without focal pneumonia, edema, pneumothorax or pleural effusion. The cardiopericardial silhouette is within normal limits for size. No acute bony abnormality. Telemetry leads overlie the chest. IMPRESSION: Low volume film without acute cardiopulmonary findings. Electronically Signed   By: Kennith Center M.D.   On: 06/26/2023 05:40    Pending Labs Unresulted Labs (From admission, onward)    None       Vitals/Pain Today's Vitals   06/26/23 1254 06/26/23 1300 06/26/23 1315 06/26/23 1400  BP:   131/89 (!) 129/91  Pulse:  87 98 89  Resp:   18 17  Temp: 98.5 F (36.9 C)     TempSrc:      SpO2:  96% 97% 98%  Weight:      Height:      PainSc:        Isolation Precautions No active  isolations  Medications Medications  lidocaine (LIDODERM) 5 % 1 patch (1 patch Transdermal Patch Applied 06/26/23 1138)  dicyclomine (BENTYL) injection 20 mg (has no administration in time range)  ondansetron (ZOFRAN-ODT) disintegrating tablet 4 mg (4 mg Oral Given 06/26/23 0508)  lactated ringers bolus 1,000 mL (0 mLs Intravenous Stopped 06/26/23 1124)  ondansetron (ZOFRAN) injection 4 mg (4 mg Intravenous Given 06/26/23 1013)  morphine (PF) 4 MG/ML injection 4 mg (4 mg Intravenous Given 06/26/23 1014)  iohexol (OMNIPAQUE) 300 MG/ML solution 75 mL (75 mLs Intravenous Contrast Given 06/26/23 1100)  morphine (PF) 4 MG/ML injection 4 mg (4 mg Intravenous Given 06/26/23 1224)  ondansetron (ZOFRAN) 8 mg in sodium chloride 0.9 % 50 mL IVPB (0 mg Intravenous Stopped 06/26/23 1336)  iohexol (OMNIPAQUE) 300 MG/ML solution 100 mL (100 mLs Intravenous Contrast Given 06/26/23 1424)    Mobility walks     Focused Assessments     R Recommendations: See Admitting Provider Note  Report given to:   Additional Notes:

## 2023-06-26 NOTE — ED Triage Notes (Signed)
Pt was d/c recently with pneumonia, states symptoms has got worse in the past week. Having chest pain with right lateral side pain. Has sob with abd pain. 10/10 Has diarrhea since leaving the hospital. Finished her antibiotic yesterday. Has taken tylenol and albuterol with no relief.

## 2023-06-26 NOTE — ED Notes (Signed)
I attempted to collect labs and was unsuccessful. 

## 2023-06-26 NOTE — Plan of Care (Signed)
  Problem: Education: Goal: Knowledge of General Education information will improve Description: Including pain rating scale, medication(s)/side effects and non-pharmacologic comfort measures Outcome: Progressing   Problem: Coping: Goal: Level of anxiety will decrease Outcome: Progressing   Problem: Pain Management: Goal: General experience of comfort will improve Outcome: Progressing

## 2023-06-27 DIAGNOSIS — R1010 Upper abdominal pain, unspecified: Secondary | ICD-10-CM | POA: Diagnosis not present

## 2023-06-27 DIAGNOSIS — E876 Hypokalemia: Secondary | ICD-10-CM | POA: Diagnosis present

## 2023-06-27 DIAGNOSIS — R112 Nausea with vomiting, unspecified: Secondary | ICD-10-CM

## 2023-06-27 DIAGNOSIS — G4700 Insomnia, unspecified: Secondary | ICD-10-CM | POA: Diagnosis present

## 2023-06-27 DIAGNOSIS — F332 Major depressive disorder, recurrent severe without psychotic features: Secondary | ICD-10-CM | POA: Diagnosis present

## 2023-06-27 DIAGNOSIS — K2100 Gastro-esophageal reflux disease with esophagitis, without bleeding: Secondary | ICD-10-CM | POA: Diagnosis present

## 2023-06-27 DIAGNOSIS — Z9049 Acquired absence of other specified parts of digestive tract: Secondary | ICD-10-CM | POA: Diagnosis not present

## 2023-06-27 DIAGNOSIS — G40909 Epilepsy, unspecified, not intractable, without status epilepticus: Secondary | ICD-10-CM | POA: Diagnosis present

## 2023-06-27 DIAGNOSIS — Z79899 Other long term (current) drug therapy: Secondary | ICD-10-CM | POA: Diagnosis not present

## 2023-06-27 DIAGNOSIS — F1020 Alcohol dependence, uncomplicated: Secondary | ICD-10-CM | POA: Diagnosis present

## 2023-06-27 DIAGNOSIS — Z881 Allergy status to other antibiotic agents status: Secondary | ICD-10-CM | POA: Diagnosis not present

## 2023-06-27 DIAGNOSIS — Z833 Family history of diabetes mellitus: Secondary | ICD-10-CM | POA: Diagnosis not present

## 2023-06-27 DIAGNOSIS — E538 Deficiency of other specified B group vitamins: Secondary | ICD-10-CM | POA: Diagnosis present

## 2023-06-27 DIAGNOSIS — Z9884 Bariatric surgery status: Secondary | ICD-10-CM | POA: Diagnosis not present

## 2023-06-27 DIAGNOSIS — Z1152 Encounter for screening for COVID-19: Secondary | ICD-10-CM | POA: Diagnosis not present

## 2023-06-27 DIAGNOSIS — E559 Vitamin D deficiency, unspecified: Secondary | ICD-10-CM | POA: Diagnosis present

## 2023-06-27 DIAGNOSIS — I10 Essential (primary) hypertension: Secondary | ICD-10-CM | POA: Diagnosis present

## 2023-06-27 LAB — CBC
HCT: 35.8 % — ABNORMAL LOW (ref 36.0–46.0)
Hemoglobin: 11.9 g/dL — ABNORMAL LOW (ref 12.0–15.0)
MCH: 36.2 pg — ABNORMAL HIGH (ref 26.0–34.0)
MCHC: 33.2 g/dL (ref 30.0–36.0)
MCV: 108.8 fL — ABNORMAL HIGH (ref 80.0–100.0)
Platelets: 319 10*3/uL (ref 150–400)
RBC: 3.29 MIL/uL — ABNORMAL LOW (ref 3.87–5.11)
RDW: 18.2 % — ABNORMAL HIGH (ref 11.5–15.5)
WBC: 11.8 10*3/uL — ABNORMAL HIGH (ref 4.0–10.5)
nRBC: 0 % (ref 0.0–0.2)

## 2023-06-27 LAB — BASIC METABOLIC PANEL
Anion gap: 7 (ref 5–15)
BUN: 5 mg/dL — ABNORMAL LOW (ref 6–20)
CO2: 25 mmol/L (ref 22–32)
Calcium: 8.5 mg/dL — ABNORMAL LOW (ref 8.9–10.3)
Chloride: 107 mmol/L (ref 98–111)
Creatinine, Ser: 0.65 mg/dL (ref 0.44–1.00)
GFR, Estimated: >60 mL/min (ref >60)
Glucose, Bld: 83 mg/dL (ref 70–99)
Potassium: 3.4 mmol/L — ABNORMAL LOW (ref 3.5–5.1)
Sodium: 139 mmol/L (ref 135–145)

## 2023-06-27 MED ORDER — LORAZEPAM 1 MG PO TABS: 1.0000 mg | ORAL_TABLET | ORAL | Status: DC | PRN

## 2023-06-27 MED ORDER — LORAZEPAM 2 MG/ML IJ SOLN
1.0000 mg | INTRAMUSCULAR | Status: DC | PRN
  Administered 2023-06-28: 2 mg via INTRAVENOUS
  Filled 2023-06-27: qty 1

## 2023-06-27 MED ORDER — ONDANSETRON HCL 4 MG/2ML IJ SOLN
4.0000 mg | Freq: Four times a day (QID) | INTRAMUSCULAR | Status: AC | PRN
Start: 2023-06-27 — End: 2023-06-27
  Administered 2023-06-27 (×3): 4 mg via INTRAVENOUS
  Filled 2023-06-27 (×3): qty 2

## 2023-06-27 MED ORDER — LEVETIRACETAM IN NACL 500 MG/100ML IV SOLN
500.0000 mg | Freq: Two times a day (BID) | INTRAVENOUS | Status: DC
  Administered 2023-06-27 – 2023-06-30 (×7): 500 mg via INTRAVENOUS
  Filled 2023-06-27 (×7): qty 100

## 2023-06-27 MED ORDER — POTASSIUM CHLORIDE 20 MEQ PO PACK: 40.0000 meq | PACK | Freq: Once | ORAL | Status: DC

## 2023-06-27 MED ORDER — THIAMINE MONONITRATE 100 MG PO TABS
100.0000 mg | ORAL_TABLET | Freq: Every day | ORAL | Status: DC
  Administered 2023-06-28 – 2023-06-30 (×2): 100 mg via ORAL
  Filled 2023-06-27 (×2): qty 1

## 2023-06-27 MED ORDER — LEVETIRACETAM IN NACL 500 MG/100ML IV SOLN: 500.0000 mg | INTRAVENOUS | Status: DC

## 2023-06-27 MED ORDER — FOLIC ACID 1 MG PO TABS
1.0000 mg | ORAL_TABLET | Freq: Every day | ORAL | Status: DC
  Administered 2023-06-28 – 2023-06-30 (×2): 1 mg via ORAL
  Filled 2023-06-27 (×2): qty 1

## 2023-06-27 MED ORDER — THIAMINE HCL 100 MG/ML IJ SOLN
100.0000 mg | Freq: Every day | INTRAMUSCULAR | Status: DC
  Administered 2023-06-27 – 2023-06-29 (×2): 100 mg via INTRAVENOUS
  Filled 2023-06-27 (×2): qty 2

## 2023-06-27 MED ORDER — ADULT MULTIVITAMIN W/MINERALS CH: 1.0000 | ORAL_TABLET | Freq: Every day | ORAL | Status: DC

## 2023-06-27 MED ORDER — LACTATED RINGERS IV SOLN: INTRAVENOUS | Status: AC

## 2023-06-27 MED ORDER — ADULT MULTIVITAMIN W/MINERALS CH
1.0000 | ORAL_TABLET | Freq: Every day | ORAL | Status: DC
  Administered 2023-06-28 – 2023-06-30 (×2): 1 via ORAL
  Filled 2023-06-27 (×2): qty 1

## 2023-06-27 MED ORDER — FOLIC ACID 1 MG PO TABS
1.0000 mg | ORAL_TABLET | Freq: Every day | ORAL | Status: DC
Start: 2023-06-27 — End: 2023-06-27

## 2023-06-27 MED ORDER — SODIUM CHLORIDE 0.9 % IV SOLN
12.5000 mg | Freq: Four times a day (QID) | INTRAVENOUS | Status: DC | PRN
  Administered 2023-06-27 – 2023-06-29 (×6): 12.5 mg via INTRAVENOUS
  Filled 2023-06-27 (×6): qty 12.5

## 2023-06-27 NOTE — Progress Notes (Signed)
Patient refusing PO medications due to nausea, RN administered PRN IV Zofran and IVPB Phenergan, patient continues to refuse PO medications, patient educated on importance of taking medications prescribed by MD, MD aware that patient is refusing PO medications at this time.

## 2023-06-27 NOTE — Progress Notes (Addendum)
PROGRESS NOTE    Marie Myers  ZOX:[id number] DOB: 11-23-1990 DOA: 06/26/2023 PCP: Delfino Lovett, FNP     Brief Narrative:  Marie Myers is a 32 y.o. female with medical history significant of anxiety, depression, status post gastric bypass, marijuana, alcohol usage who presents emergency department due to epigastric pain and dyspepsia.  Patient was recently treated for necrotizing pneumonia with Augmentin in October. She also had nausea vomiting at that time. Surgery was consulted and upper GI series was performed which did not show any leakage or perforation. She was planning to see them in outpatient setting. She finished her last dose of Augmentin yesterday and has since had worsening epigastric pain nausea and vomiting. She was taking CBD Gummies to assist with the nausea without relief. She has not smoked marijuana in about 2 weeks. Patient underwent a CT abdomen pelvis which showed no acute findings and stable surgical changes from gastric bypass surgery. She also has evidence of a cholecystectomy. CT chest demonstrated complete resolution of pneumonia. X-ray showed low lung volumes. Patient was admitted for further workup.   New events last 24 hours / Subjective: Patient complaining of 10 out of 10 pain, unable to take any oral medication due to her significant nausea and vomiting.  Assessment & Plan:    Principal Problem:   Intractable nausea and vomiting Active Problems:   Seizure disorder (HCC)   Abdominal pain   Alcohol use disorder, severe, dependence (HCC)   MDD (major depressive disorder), recurrent episode, severe (HCC)   Folate deficiency   Intractable nausea, vomiting -Has prior history of gastric bypass in March 2017 at [facility] by Dr. Thedore Mins.  Appears that patient has sought care in ED, hospitalized numerous times since 2020 regarding nausea, vomiting, generalized abdominal pain.  Of note in 08/18/2020, patient was admitted to [facility]  and underwent upper endoscopy which was unremarkable.  At that time, she also underwent upper GI, SBFT without acute abnormality.  She improved with symptomatic treatment. -Underwent upper GI series 06/04/2023, negative for esophageal perforation or significant hiatal hernia -CT abdomen pelvis unremarkable.  Stable surgical changes from gastric bypass surgery, persistent small hiatal hernia, status post cholecystectomy -Vit B12 was 217  -Folate low 4.7 --> replace  -Check Vit D and B1  -IVF due to lack of PO intake  -Zofran and Phenergan  -IV PPI and carafate  -Multivitamin  -Discuss with GI today -Dr. Levora Angel for consult  History of seizure -Keppra, changed to IV  Recent history of necrotizing pneumonia -Completed Augmentin treatment -CT chest revealed near complete resolution right upper lobe airspace disease  Hypokalemia -Replace  Mood disorder -Abilify  Alcohol use disorder -Reported 3 to 4 glasses of wine daily -CIWA  DVT prophylaxis:  enoxaparin (LOVENOX) injection 40 mg Start: 06/26/23 1800 SCDs Start: 06/26/23 1539  Code Status: Full Family Communication: None  Disposition Plan: Home Status is: Observation The patient will require care spanning > 2 midnights and should be moved to inpatient because: IVF     Antimicrobials:  Anti-infectives (From admission, onward)    None        Objective: Vitals:   06/26/23 2104 06/26/23 2200 06/27/23 0130 06/27/23 0515  BP: (!) 171/115 122/87 126/78 135/83  Pulse: 71 66 65 62  Resp: 15  16 15   Temp: 99.1 F (37.3 C)  98.1 F (36.7 C) 97.6 F (36.4 C)  TempSrc: Oral  Oral Oral  SpO2: 99%  98% 99%  Weight:  Height:        Intake/Output Summary (Last 24 hours) at 06/27/2023 1131 Last data filed at 06/27/2023 0600 Gross per 24 hour  Intake 84 ml  Output 300 ml  Net -216 ml   Filed Weights   06/26/23 0446  Weight: 85.3 kg    Examination:  General exam: Appears calm and comfortable  Respiratory  system: Clear to auscultation. Respiratory effort normal. No respiratory distress. No conversational dyspnea.  Cardiovascular system: S1 & S2 heard, RRR. No murmurs. No pedal edema. Gastrointestinal system: Abdomen is nondistended, soft and TTP periumbilical  Central nervous system: Alert and oriented. No focal neurological deficits. Speech clear.  Extremities: Symmetric in appearance  Skin: No rashes, lesions or ulcers on exposed skin   Data Reviewed: I have personally reviewed following labs and imaging studies  CBC: Recent Labs  Lab 06/26/23 0520 06/27/23 0454  WBC 14.3* 11.8*  HGB 13.4 11.9*  HCT 40.2 35.8*  MCV 108.1* 108.8*  PLT 398 319   Basic Metabolic Panel: Recent Labs  Lab 06/26/23 0520 06/27/23 0454  NA 138 139  K 3.6 3.4*  CL 101 107  CO2 21* 25  GLUCOSE 150* 83  BUN <5* 5*  CREATININE 0.59 0.65  CALCIUM 9.9 8.5*   GFR: Estimated Creatinine Clearance: 112.1 mL/min (by C-G formula based on SCr of 0.65 mg/dL). Liver Function Tests: Recent Labs  Lab 06/26/23 0520  AST 16  ALT 15  ALKPHOS 79  BILITOT 0.9  PROT 8.1  ALBUMIN 4.2   Recent Labs  Lab 06/26/23 0520  LIPASE 24   No results for input(s): "AMMONIA" in the last 168 hours. Coagulation Profile: No results for input(s): "INR", "PROTIME" in the last 168 hours. Cardiac Enzymes: No results for input(s): "CKTOTAL", "CKMB", "CKMBINDEX", "TROPONINI" in the last 168 hours. BNP (last 3 results) No results for input(s): "PROBNP" in the last 8760 hours. HbA1C: No results for input(s): "HGBA1C" in the last 72 hours. CBG: No results for input(s): "GLUCAP" in the last 168 hours. Lipid Profile: No results for input(s): "CHOL", "HDL", "LDLCALC", "TRIG", "CHOLHDL", "LDLDIRECT" in the last 72 hours. Thyroid Function Tests: No results for input(s): "TSH", "T4TOTAL", "FREET4", "T3FREE", "THYROIDAB" in the last 72 hours. Anemia Panel: No results for input(s): "VITAMINB12", "FOLATE", "FERRITIN", "TIBC",  "IRON", "RETICCTPCT" in the last 72 hours. Sepsis Labs: No results for input(s): "PROCALCITON", "LATICACIDVEN" in the last 168 hours.  Recent Results (from the past 240 hour(s))  Resp panel by RT-PCR (RSV, Flu A&B, Covid) Anterior Nasal Swab     Status: None   Collection Time: 06/26/23 11:30 AM   Specimen: Anterior Nasal Swab  Result Value Ref Range Status   SARS Coronavirus 2 by RT PCR NEGATIVE NEGATIVE Final    Comment: (NOTE) SARS-CoV-2 target nucleic acids are NOT DETECTED.  The SARS-CoV-2 RNA is generally detectable in upper respiratory specimens during the acute phase of infection. The lowest concentration of SARS-CoV-2 viral copies this assay can detect is 138 copies/mL. A negative result does not preclude SARS-Cov-2 infection and should not be used as the sole basis for treatment or other patient management decisions. A negative result may occur with  improper specimen collection/handling, submission of specimen other than nasopharyngeal swab, presence of viral mutation(s) within the areas targeted by this assay, and inadequate number of viral copies(<138 copies/mL). A negative result must be combined with clinical observations, patient history, and epidemiological information. The expected result is Negative.  Fact Sheet for Patients:  BloggerCourse.com  Fact Sheet for Healthcare  Providers:  SeriousBroker.it  This test is no t yet approved or cleared by the [place] and  has been authorized for detection and/or diagnosis of SARS-CoV-2 by FDA under an Emergency Use Authorization (EUA). This EUA will remain  in effect (meaning this test can be used) for the duration of the COVID-19 declaration under Section 564(b)(1) of the Act, 21 U.S.C.section 360bbb-3(b)(1), unless the authorization is terminated  or revoked sooner.       Influenza A by PCR NEGATIVE NEGATIVE Final   Influenza B by PCR NEGATIVE NEGATIVE  Final    Comment: (NOTE) The Xpert Xpress SARS-CoV-2/FLU/RSV plus assay is intended as an aid in the diagnosis of influenza from Nasopharyngeal swab specimens and should not be used as a sole basis for treatment. Nasal washings and aspirates are unacceptable for Xpert Xpress SARS-CoV-2/FLU/RSV testing.  Fact Sheet for Patients: BloggerCourse.com  Fact Sheet for Healthcare Providers: SeriousBroker.it  This test is not yet approved or cleared by the [place] FDA and has been authorized for detection and/or diagnosis of SARS-CoV-2 by FDA under an Emergency Use Authorization (EUA). This EUA will remain in effect (meaning this test can be used) for the duration of the COVID-19 declaration under Section 564(b)(1) of the Act, 21 U.S.C. section 360bbb-3(b)(1), unless the authorization is terminated or revoked.     Resp Syncytial Virus by PCR NEGATIVE NEGATIVE Final    Comment: (NOTE) Fact Sheet for Patients: BloggerCourse.com  Fact Sheet for Healthcare Providers: SeriousBroker.it  This test is not yet approved or cleared by the [place] FDA and has been authorized for detection and/or diagnosis of SARS-CoV-2 by FDA under an Emergency Use Authorization (EUA). This EUA will remain in effect (meaning this test can be used) for the duration of the COVID-19 declaration under Section 564(b)(1) of the Act, 21 U.S.C. section 360bbb-3(b)(1), unless the authorization is terminated or revoked.  Performed at [facility], [address], [place], [place] [address]       Radiology Studies: CT ABDOMEN PELVIS W CONTRAST  Result Date: 06/26/2023 CLINICAL DATA:  Abdominal pain. EXAM: CT ABDOMEN AND PELVIS WITH CONTRAST TECHNIQUE: Multidetector CT imaging of the abdomen and pelvis was performed using the standard protocol following bolus administration of intravenous  contrast. RADIATION DOSE REDUCTION: This exam was performed according to the departmental dose-optimization program which includes automated exposure control, adjustment of the mA and/or kV according to patient size and/or use of iterative reconstruction technique. CONTRAST:  OMNIPAQUE IOHEXOL 300 MG/ML  SOLN COMPARISON:  Multiple prior imaging studies. The most recent CT scan is 06/03/2023 FINDINGS: Lower chest: The lung bases are clear of acute process. No pleural effusion or pulmonary lesions. The heart is normal in size. No pericardial effusion. Stable surgical changes along the GE junction and persistent small hiatal hernia. Hepatobiliary: No hepatic lesions or intrahepatic biliary dilatation. Stable hepatic steatosis. The gallbladder is surgically absent. Stable mild associated common bile duct dilatation. Pancreas: Unremarkable. No pancreatic ductal dilatation or surrounding inflammatory changes. Spleen: Normal in size without focal abnormality. Adrenals/Urinary Tract: The adrenal glands and kidneys are unremarkable. Contrast in the collecting systems and bladder from earlier chest CT. Stomach/Bowel: Stable surgical changes from gastric bypass surgery. The small bowel and colon are grossly normal. No obstructive findings. Vascular/Lymphatic: The aorta is normal in caliber. No dissection. The branch vessels are patent. The major venous structures are patent. No mesenteric or retroperitoneal mass or adenopathy. Small scattered lymph nodes are noted. Reproductive: No significant findings. Other: No  pelvic mass or adenopathy. No free pelvic fluid collections. No inguinal mass or adenopathy. No abdominal wall hernia or subcutaneous lesions. Musculoskeletal: Stable left hip hardware and stable advanced bilateral hip joint degenerative changes for age. IMPRESSION: 1. No acute abdominal/pelvic findings, mass lesions or adenopathy. 2. Stable surgical changes from gastric bypass surgery and persistent small  hiatal hernia. 3. Status post cholecystectomy with stable mild associated common bile duct dilatation. 4. Stable advanced bilateral hip joint degenerative changes for age. Electronically Signed   By: Rudie Meyer M.D.   On: 06/26/2023 14:54   CT Chest W Contrast  Result Date: 06/26/2023 CLINICAL DATA:  Right-sided chest pain and shortness of breath. Cavitary pneumonia. EXAM: CT CHEST WITH CONTRAST TECHNIQUE: Multidetector CT imaging of the chest was performed during intravenous contrast administration. RADIATION DOSE REDUCTION: This exam was performed according to the departmental dose-optimization program which includes automated exposure control, adjustment of the mA and/or kV according to patient size and/or use of iterative reconstruction technique. CONTRAST:  75mL OMNIPAQUE IOHEXOL 300 MG/ML  SOLN COMPARISON:  Chest CTA on 06/03/2023 FINDINGS: Cardiovascular:  No acute findings. Mediastinum/Nodes: No masses or pathologically enlarged lymph nodes identified. Lungs/Pleura: Airspace disease with central cavitation in the right upper lobe has nearly completely resolved since previous study. Subpulmonic air-fluid collection in the lateral right lower lobe has also resolved. Mild residual atelectasis or scarring noted. No new areas of pulmonary opacity are seen. No evidence of pleural effusion. Upper Abdomen: Previous gastric bypass surgery and small hiatal hernia again noted. Significantly decreased hepatic steatosis since prior study. Musculoskeletal:  No suspicious bone lesions. IMPRESSION: Near complete resolution of right upper lobe airspace disease with central cavitation since prior study. Resolution of subpulmonic air-fluid collection in lateral right lower lobe. No new or progressive disease. Stable small hiatal hernia and previous gastric bypass surgery. Significantly decreased hepatic steatosis since prior study. Electronically Signed   By: Danae Orleans M.D.   On: 06/26/2023 11:46   DG Chest 2  View  Result Date: 06/26/2023 CLINICAL DATA:  Chest pain. EXAM: CHEST - 2 VIEW COMPARISON:  06/03/2023 FINDINGS: Low volume film. The lungs are clear without focal pneumonia, edema, pneumothorax or pleural effusion. The cardiopericardial silhouette is within normal limits for size. No acute bony abnormality. Telemetry leads overlie the chest. IMPRESSION: Low volume film without acute cardiopulmonary findings. Electronically Signed   By: Kennith Center M.D.   On: 06/26/2023 05:40      Scheduled Meds:  ARIPiprazole  5 mg Oral Daily   enoxaparin (LOVENOX) injection  40 mg Subcutaneous Q24H   lidocaine  1 patch Transdermal Q24H   pantoprazole (PROTONIX) IV  40 mg Intravenous Q12H   potassium chloride  40 mEq Oral Once   sertraline  100 mg Oral Daily   sucralfate  1 g Oral TID WC & HS   traZODone  200 mg Oral QHS   Continuous Infusions:  lactated ringers 75 mL/hr at 06/27/23 1050   levETIRAcetam 500 mg (06/27/23 1053)   promethazine (PHENERGAN) injection (IM or IVPB) 12.5 mg (06/27/23 1114)     LOS: 0 days   Time spent: 45 minutes   Noralee Stain, DO [facility] 06/27/2023, 11:31 AM   Available via Epic secure chat 7am-7pm After these hours, please refer to coverage provider listed on [email or web address]

## 2023-06-28 ENCOUNTER — Encounter (HOSPITAL_COMMUNITY): Payer: Self-pay | Admitting: Internal Medicine

## 2023-06-28 ENCOUNTER — Encounter (HOSPITAL_BASED_OUTPATIENT_CLINIC_OR_DEPARTMENT_OTHER): Payer: Self-pay

## 2023-06-28 DIAGNOSIS — R112 Nausea with vomiting, unspecified: Secondary | ICD-10-CM | POA: Diagnosis not present

## 2023-06-28 LAB — MAGNESIUM: Magnesium: 1.9 mg/dL (ref 1.7–2.4)

## 2023-06-28 LAB — BASIC METABOLIC PANEL
Anion gap: 11 (ref 5–15)
BUN: 8 mg/dL (ref 6–20)
CO2: 25 mmol/L (ref 22–32)
Calcium: 8.2 mg/dL — ABNORMAL LOW (ref 8.9–10.3)
Chloride: 104 mmol/L (ref 98–111)
Creatinine, Ser: 0.65 mg/dL (ref 0.44–1.00)
GFR, Estimated: >60 mL/min (ref >60)
Glucose, Bld: 72 mg/dL (ref 70–99)
Potassium: 3 mmol/L — ABNORMAL LOW (ref 3.5–5.1)
Sodium: 140 mmol/L (ref 135–145)

## 2023-06-28 LAB — CBC
HCT: 32.3 % — ABNORMAL LOW (ref 36.0–46.0)
Hemoglobin: 10.3 g/dL — ABNORMAL LOW (ref 12.0–15.0)
MCH: 35.9 pg — ABNORMAL HIGH (ref 26.0–34.0)
MCHC: 31.9 g/dL (ref 30.0–36.0)
MCV: 112.5 fL — ABNORMAL HIGH (ref 80.0–100.0)
Platelets: 244 10*3/uL (ref 150–400)
RBC: 2.87 MIL/uL — ABNORMAL LOW (ref 3.87–5.11)
RDW: 17.9 % — ABNORMAL HIGH (ref 11.5–15.5)
WBC: 10.2 10*3/uL (ref 4.0–10.5)
nRBC: 0 % (ref 0.0–0.2)

## 2023-06-28 LAB — VITAMIN D 25 HYDROXY (VIT D DEFICIENCY, FRACTURES): Vit D, 25-Hydroxy: 20.48 ng/mL — ABNORMAL LOW (ref 30–100)

## 2023-06-28 LAB — PHOSPHORUS: Phosphorus: 4.7 mg/dL — ABNORMAL HIGH (ref 2.5–4.6)

## 2023-06-28 MED ORDER — DEXTROSE-SODIUM CHLORIDE 5-0.45 % IV SOLN: INTRAVENOUS | Status: AC

## 2023-06-28 MED ORDER — LACTATED RINGERS IV SOLN: INTRAVENOUS | Status: DC

## 2023-06-28 MED ORDER — HYDRALAZINE HCL 20 MG/ML IJ SOLN
10.0000 mg | INTRAMUSCULAR | Status: DC | PRN
  Administered 2023-06-28: 10 mg via INTRAVENOUS
  Filled 2023-06-28: qty 1

## 2023-06-28 MED ORDER — ONDANSETRON HCL 4 MG/2ML IJ SOLN
4.0000 mg | Freq: Four times a day (QID) | INTRAMUSCULAR | Status: DC | PRN
  Administered 2023-06-28 – 2023-06-30 (×5): 4 mg via INTRAVENOUS
  Filled 2023-06-28 (×5): qty 2

## 2023-06-28 MED ORDER — METOPROLOL TARTRATE 5 MG/5ML IV SOLN
5.0000 mg | INTRAVENOUS | Status: DC | PRN
Start: 2023-06-28 — End: 2023-06-30

## 2023-06-28 MED ORDER — IPRATROPIUM-ALBUTEROL 0.5-2.5 (3) MG/3ML IN SOLN: 3.0000 mL | RESPIRATORY_TRACT | Status: DC | PRN

## 2023-06-28 MED ORDER — SENNOSIDES-DOCUSATE SODIUM 8.6-50 MG PO TABS: 1.0000 | ORAL_TABLET | Freq: Every evening | ORAL | Status: DC | PRN

## 2023-06-28 MED ORDER — POTASSIUM CHLORIDE 10 MEQ/100ML IV SOLN
10.0000 meq | INTRAVENOUS | Status: AC
  Administered 2023-06-28 (×6): 10 meq via INTRAVENOUS
  Filled 2023-06-28 (×6): qty 100

## 2023-06-28 NOTE — Plan of Care (Signed)
  Problem: Clinical Measurements: Goal: Diagnostic test results will improve Outcome: Progressing   Problem: Elimination: Goal: Will not experience complications related to bowel motility Outcome: Progressing   Problem: Pain Management: Goal: General experience of comfort will improve Outcome: Progressing

## 2023-06-28 NOTE — Hospital Course (Addendum)
  Brief Narrative:  Marie Myers is a 32 y.o. female with medical history significant of anxiety, depression, status post gastric bypass, marijuana, alcohol usage who presents emergency department due to epigastric pain and dyspepsia.  Patient was recently treated for necrotizing pneumonia with Augmentin in October. She also had nausea vomiting at that time. Surgery was consulted and upper GI series was performed which did not show any leakage or perforation. She was planning to see them in outpatient setting. She finished her last dose of Augmentin yesterday and has since had worsening epigastric pain nausea and vomiting. She was taking CBD Gummies to assist with the nausea without relief. She has not smoked marijuana in about 2 weeks. Patient underwent a CT abdomen pelvis which showed no acute findings and stable surgical changes from gastric bypass surgery. She also has evidence of a cholecystectomy. CT chest demonstrated complete resolution of pneumonia. X-ray showed low lung volumes. Patient was admitted for further workup.  [facility] planning EGD today.        Assessment & Plan:     Principal Problem:   Intractable nausea and vomiting Active Problems:   Seizure disorder (HCC)   Abdominal pain   Alcohol use disorder, severe, dependence (HCC)   MDD (major depressive disorder), recurrent episode, severe (HCC)   Folate deficiency     Intractable nausea, vomiting History of gastric bypass 2017 [facility] Unfortunately patient has had multiple admissions in the past due to nausea vomiting and generalized abdominal pain.  Had endoscopy at [facility] in December 2021 which was unremarkable.  Upper GI series about a month ago in October was also negative.  CT abdomen pelvis does not show any acute pathology.  Stable gastric bypass changes. -Continue antiemetics - [facility] planning EGD todAy with possible dilation   History of seizure IV Keppra   Recent history of  necrotizing pneumonia -Completed Augmentin treatment -CT chest revealed near complete resolution right upper lobe airspace disease   Hypokalemia Replete as needed  Folic acid deficiency Vitamin D deficiency - Supplements   Mood disorder -Abilify   Alcohol use disorder Currently on alcohol withdrawal protocol.  Folic acid, multivitamin and thiamine   DVT prophylaxis: enoxaparin (LOVENOX) Code Status: Full Family Communication: Fianc at bedside Disposition Plan: Home Planning EGD  Subjective: No complaints doing well.  Tells me her nausea is slightly better today     Examination:  General exam: Appears calm and comfortable  Respiratory system: Clear to auscultation. Respiratory effort normal. No respiratory distress. No conversational dyspnea.  Cardiovascular system: S1 & S2 heard, RRR. No murmurs. No pedal edema. Gastrointestinal system: Abdomen is nondistended, soft and TTP periumbilical  Central nervous system: Alert and oriented. No focal neurological deficits. Speech clear.  Extremities: Symmetric in appearance  Skin: No rashes, lesions or ulcers on exposed skin

## 2023-06-28 NOTE — Progress Notes (Signed)
PROGRESS NOTE    Marie Myers  ZHY:[id number] DOB: 07-08-1991 DOA: 06/26/2023 PCP: Delfino Lovett, FNP     Brief Narrative:  Marie Myers is a 32 y.o. female with medical history significant of anxiety, depression, status post gastric bypass, marijuana, alcohol usage who presents emergency department due to epigastric pain and dyspepsia.  Patient was recently treated for necrotizing pneumonia with Augmentin in October. She also had nausea vomiting at that time. Surgery was consulted and upper GI series was performed which did not show any leakage or perforation. She was planning to see them in outpatient setting. She finished her last dose of Augmentin yesterday and has since had worsening epigastric pain nausea and vomiting. She was taking CBD Gummies to assist with the nausea without relief. She has not smoked marijuana in about 2 weeks. Patient underwent a CT abdomen pelvis which showed no acute findings and stable surgical changes from gastric bypass surgery. She also has evidence of a cholecystectomy. CT chest demonstrated complete resolution of pneumonia. X-ray showed low lung volumes. Patient was admitted for further workup.  [facility] consulted, planning for endoscopy tomorrow.       Assessment & Plan:     Principal Problem:   Intractable nausea and vomiting Active Problems:   Seizure disorder (HCC)   Abdominal pain   Alcohol use disorder, severe, dependence (HCC)   MDD (major depressive disorder), recurrent episode, severe (HCC)   Folate deficiency     Intractable nausea, vomiting History of gastric bypass 2017 [facility] Unfortunately patient has had multiple admissions in the past due to nausea vomiting and generalized abdominal pain.  Had endoscopy at [facility] in December 2021 which was unremarkable.  Upper GI series about a month ago in October was also negative.  CT abdomen pelvis does not show any acute pathology.  Stable gastric bypass  changes. -Continue antiemetics - [facility] planning EGD tomorrow   History of seizure IV Keppra   Recent history of necrotizing pneumonia -Completed Augmentin treatment -CT chest revealed near complete resolution right upper lobe airspace disease   Hypokalemia Replete as needed  Folic acid deficiency - Supplements   Mood disorder -Abilify   Alcohol use disorder Currently on alcohol withdrawal protocol.  Folic acid, multivitamin and thiamine   DVT prophylaxis: enoxaparin (LOVENOX) Code Status: Full Family Communication: None  Disposition Plan: Home Status is: Observation The patient will require care spanning > 2 midnights and should be moved to inpatient because: IVF   Subjective: Persistent nausea vomiting Poor p.o. intake     Examination:  General exam: Appears calm and comfortable  Respiratory system: Clear to auscultation. Respiratory effort normal. No respiratory distress. No conversational dyspnea.  Cardiovascular system: S1 & S2 heard, RRR. No murmurs. No pedal edema. Gastrointestinal system: Abdomen is nondistended, soft and TTP periumbilical  Central nervous system: Alert and oriented. No focal neurological deficits. Speech clear.  Extremities: Symmetric in appearance  Skin: No rashes, lesions or ulcers on exposed skin              Diet Orders (From admission, onward)     Start     Ordered   06/29/23 0001  Diet NPO time specified  Diet effective midnight        06/28/23 1138   06/28/23 1138  Diet clear liquid Fluid consistency: Thin  Diet effective now       Question:  Fluid consistency:  Answer:  Thin   06/28/23 1138  Objective: Vitals:   06/27/23 1149 06/27/23 1415 06/27/23 2021 06/28/23 0526  BP: 130/77 120/79 106/69 102/64  Pulse: 70 79 84 62  Resp:  16 17 17   Temp:  98 F (36.7 C) 98.5 F (36.9 C) 97.7 F (36.5 C)  TempSrc:  Oral Oral Oral  SpO2:  100% 99% 99%  Weight:      Height:        Intake/Output Summary  (Last 24 hours) at 06/28/2023 1233 Last data filed at 06/28/2023 1001 Gross per 24 hour  Intake 391.99 ml  Output 0 ml  Net 391.99 ml   Filed Weights   06/26/23 0446  Weight: 85.3 kg    Scheduled Meds:  ARIPiprazole  5 mg Oral Daily   enoxaparin (LOVENOX) injection  40 mg Subcutaneous Q24H   folic acid  1 mg Oral Daily   lidocaine  1 patch Transdermal Q24H   multivitamin with minerals  1 tablet Oral Daily   pantoprazole (PROTONIX) IV  40 mg Intravenous Q12H   potassium chloride  40 mEq Oral Once   sertraline  100 mg Oral Daily   sucralfate  1 g Oral TID WC & HS   thiamine  100 mg Oral Daily   Or   thiamine  100 mg Intravenous Daily   traZODone  200 mg Oral QHS   Continuous Infusions:  dextrose 5 % and 0.45 % NaCl     levETIRAcetam 500 mg (06/28/23 1044)   potassium chloride 10 mEq (06/28/23 1204)   promethazine (PHENERGAN) injection (IM or IVPB) 12.5 mg (06/28/23 1155)    Nutritional status     Body mass index is 30.34 kg/m.  Data Reviewed:   CBC: Recent Labs  Lab 06/26/23 0520 06/27/23 0454 06/28/23 0406  WBC 14.3* 11.8* 10.2  HGB 13.4 11.9* 10.3*  HCT 40.2 35.8* 32.3*  MCV 108.1* 108.8* 112.5*  PLT 398 319 244   Basic Metabolic Panel: Recent Labs  Lab 06/26/23 0520 06/27/23 0454 06/28/23 0406  NA 138 139 140  K 3.6 3.4* 3.0*  CL 101 107 104  CO2 21* 25 25  GLUCOSE 150* 83 72  BUN <5* 5* 8  CREATININE 0.59 0.65 0.65  CALCIUM 9.9 8.5* 8.2*  MG  --   --  1.9  PHOS  --   --  4.7*   GFR: Estimated Creatinine Clearance: 112.1 mL/min (by C-G formula based on SCr of 0.65 mg/dL). Liver Function Tests: Recent Labs  Lab 06/26/23 0520  AST 16  ALT 15  ALKPHOS 79  BILITOT 0.9  PROT 8.1  ALBUMIN 4.2   Recent Labs  Lab 06/26/23 0520  LIPASE 24   No results for input(s): "AMMONIA" in the last 168 hours. Coagulation Profile: No results for input(s): "INR", "PROTIME" in the last 168 hours. Cardiac Enzymes: No results for input(s): "CKTOTAL",  "CKMB", "CKMBINDEX", "TROPONINI" in the last 168 hours. BNP (last 3 results) No results for input(s): "PROBNP" in the last 8760 hours. HbA1C: No results for input(s): "HGBA1C" in the last 72 hours. CBG: No results for input(s): "GLUCAP" in the last 168 hours. Lipid Profile: No results for input(s): "CHOL", "HDL", "LDLCALC", "TRIG", "CHOLHDL", "LDLDIRECT" in the last 72 hours. Thyroid Function Tests: No results for input(s): "TSH", "T4TOTAL", "FREET4", "T3FREE", "THYROIDAB" in the last 72 hours. Anemia Panel: No results for input(s): "VITAMINB12", "FOLATE", "FERRITIN", "TIBC", "IRON", "RETICCTPCT" in the last 72 hours. Sepsis Labs: No results for input(s): "PROCALCITON", "LATICACIDVEN" in the last 168 hours.  Recent Results (from the  past 240 hour(s))  Resp panel by RT-PCR (RSV, Flu A&B, Covid) Anterior Nasal Swab     Status: None   Collection Time: 06/26/23 11:30 AM   Specimen: Anterior Nasal Swab  Result Value Ref Range Status   SARS Coronavirus 2 by RT PCR NEGATIVE NEGATIVE Final    Comment: (NOTE) SARS-CoV-2 target nucleic acids are NOT DETECTED.  The SARS-CoV-2 RNA is generally detectable in upper respiratory specimens during the acute phase of infection. The lowest concentration of SARS-CoV-2 viral copies this assay can detect is 138 copies/mL. A negative result does not preclude SARS-Cov-2 infection and should not be used as the sole basis for treatment or other patient management decisions. A negative result may occur with  improper specimen collection/handling, submission of specimen other than nasopharyngeal swab, presence of viral mutation(s) within the areas targeted by this assay, and inadequate number of viral copies(<138 copies/mL). A negative result must be combined with clinical observations, patient history, and epidemiological information. The expected result is Negative.  Fact Sheet for Patients:  BloggerCourse.com  Fact Sheet for  Healthcare Providers:  SeriousBroker.it  This test is no t yet approved or cleared by the [place] FDA and  has been authorized for detection and/or diagnosis of SARS-CoV-2 by FDA under an Emergency Use Authorization (EUA). This EUA will remain  in effect (meaning this test can be used) for the duration of the COVID-19 declaration under Section 564(b)(1) of the Act, 21 U.S.C.section 360bbb-3(b)(1), unless the authorization is terminated  or revoked sooner.       Influenza A by PCR NEGATIVE NEGATIVE Final   Influenza B by PCR NEGATIVE NEGATIVE Final    Comment: (NOTE) The Xpert Xpress SARS-CoV-2/FLU/RSV plus assay is intended as an aid in the diagnosis of influenza from Nasopharyngeal swab specimens and should not be used as a sole basis for treatment. Nasal washings and aspirates are unacceptable for Xpert Xpress SARS-CoV-2/FLU/RSV testing.  Fact Sheet for Patients: BloggerCourse.com  Fact Sheet for Healthcare Providers: SeriousBroker.it  This test is not yet approved or cleared by the [place] FDA and has been authorized for detection and/or diagnosis of SARS-CoV-2 by FDA under an Emergency Use Authorization (EUA). This EUA will remain in effect (meaning this test can be used) for the duration of the COVID-19 declaration under Section 564(b)(1) of the Act, 21 U.S.C. section 360bbb-3(b)(1), unless the authorization is terminated or revoked.     Resp Syncytial Virus by PCR NEGATIVE NEGATIVE Final    Comment: (NOTE) Fact Sheet for Patients: BloggerCourse.com  Fact Sheet for Healthcare Providers: SeriousBroker.it  This test is not yet approved or cleared by the [place] FDA and has been authorized for detection and/or diagnosis of SARS-CoV-2 by FDA under an Emergency Use Authorization (EUA). This EUA will remain in effect (meaning this  test can be used) for the duration of the COVID-19 declaration under Section 564(b)(1) of the Act, 21 U.S.C. section 360bbb-3(b)(1), unless the authorization is terminated or revoked.  Performed at [facility], [address], [place], [place] [address]          Radiology Studies: CT ABDOMEN PELVIS W CONTRAST  Result Date: 06/26/2023 CLINICAL DATA:  Abdominal pain. EXAM: CT ABDOMEN AND PELVIS WITH CONTRAST TECHNIQUE: Multidetector CT imaging of the abdomen and pelvis was performed using the standard protocol following bolus administration of intravenous contrast. RADIATION DOSE REDUCTION: This exam was performed according to the departmental dose-optimization program which includes automated exposure control, adjustment of the mA and/or kV according  to patient size and/or use of iterative reconstruction technique. CONTRAST:  OMNIPAQUE IOHEXOL 300 MG/ML  SOLN COMPARISON:  Multiple prior imaging studies. The most recent CT scan is 06/03/2023 FINDINGS: Lower chest: The lung bases are clear of acute process. No pleural effusion or pulmonary lesions. The heart is normal in size. No pericardial effusion. Stable surgical changes along the GE junction and persistent small hiatal hernia. Hepatobiliary: No hepatic lesions or intrahepatic biliary dilatation. Stable hepatic steatosis. The gallbladder is surgically absent. Stable mild associated common bile duct dilatation. Pancreas: Unremarkable. No pancreatic ductal dilatation or surrounding inflammatory changes. Spleen: Normal in size without focal abnormality. Adrenals/Urinary Tract: The adrenal glands and kidneys are unremarkable. Contrast in the collecting systems and bladder from earlier chest CT. Stomach/Bowel: Stable surgical changes from gastric bypass surgery. The small bowel and colon are grossly normal. No obstructive findings. Vascular/Lymphatic: The aorta is normal in caliber. No dissection. The branch vessels are patent.  The major venous structures are patent. No mesenteric or retroperitoneal mass or adenopathy. Small scattered lymph nodes are noted. Reproductive: No significant findings. Other: No pelvic mass or adenopathy. No free pelvic fluid collections. No inguinal mass or adenopathy. No abdominal wall hernia or subcutaneous lesions. Musculoskeletal: Stable left hip hardware and stable advanced bilateral hip joint degenerative changes for age. IMPRESSION: 1. No acute abdominal/pelvic findings, mass lesions or adenopathy. 2. Stable surgical changes from gastric bypass surgery and persistent small hiatal hernia. 3. Status post cholecystectomy with stable mild associated common bile duct dilatation. 4. Stable advanced bilateral hip joint degenerative changes for age. Electronically Signed   By: Rudie Meyer M.D.   On: 06/26/2023 14:54           LOS: 1 day   Time spent= 35 mins    Miguel Rota, MD [facility]  If 7PM-7AM, please contact night-coverage  06/28/2023, 12:33 PM

## 2023-06-28 NOTE — TOC Initial Note (Signed)
Transition of Care [facility]) - Initial/Assessment Note   Patient Details  Name: Marie Myers MRN: [id number] Date of Birth: 1990/12/03  [facility]) CM/SW Contact:    Ewing Schlein, LCSW Phone Number: 06/28/2023, 10:19 AM  Clinical Narrative: [facility] consulted for ETOH use resources. Patient declined resources at this time. Per chart review, patient received resources on 06/06/23 during her previous hospitalization.  Expected Discharge Plan: Home/Self Care Barriers to Discharge: No Barriers Identified  Patient Goals and CMS Choice Patient states their goals for this hospitalization and ongoing recovery are:: Return home Choice offered to / list presented to : NA  Expected Discharge Plan and Services In-house Referral: Clinical Social Work Discharge Planning Services: NA Post Acute Care Choice: NA Living arrangements for the past 2 months: Single Family Home Expected Discharge Date: 06/28/23               DME Arranged: N/A DME Agency: NA  Prior Living Arrangements/Services Living arrangements for the past 2 months: Single Family Home Lives with:: Significant Other Patient language and need for interpreter reviewed:: Yes Do you feel safe going back to the place where you live?: Yes      Need for Family Participation in Patient Care: No (Comment) Care giver support system in place?: Yes (comment) Criminal Activity/Legal Involvement Pertinent to Current Situation/Hospitalization: No - Comment as needed  Activities of Daily Living ADL Screening (condition at time of admission) Independently performs ADLs?: Yes (appropriate for developmental age) Is the patient deaf or have difficulty hearing?: No Does the patient have difficulty seeing, even when wearing glasses/contacts?: No Does the patient have difficulty concentrating, remembering, or making decisions?: No  Emotional Assessment Attitude/Demeanor/Rapport: Engaged Affect (typically observed): Appropriate Orientation:  : Oriented to Self, Oriented to Place, Oriented to  Time, Oriented to Situation Alcohol / Substance Use: Alcohol Use Psych Involvement: No (comment)  Admission diagnosis:  Intractable nausea and vomiting [R11.2] Patient Active Problem List   Diagnosis Date Noted   Macrocytic anemia 06/07/2023   Thrombocytopenia (HCC) 06/06/2023   Folate deficiency 06/06/2023   Necrotizing pneumonia (HCC) 06/04/2023   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/01/2023   Seizures (HCC) 10/03/2022   Anxiety 10/03/2022   Insomnia 10/03/2022   Leukocytosis 10/03/2022   Seizure-like activity (HCC) 08/23/2022   Alcohol use 08/23/2022   Seizure disorder (HCC) 08/22/2022   MDD (major depressive disorder), recurrent episode, severe (HCC) 01/10/2022   Alcohol use disorder, severe, dependence (HCC) 01/08/2021   Marijuana use 01/08/2021   MDD (major depressive disorder), recurrent severe, without psychosis (HCC) 01/07/2021   Metabolic acidemia 10/21/2020   Tetrahydrocannabinol (THC) dependence (HCC) 10/16/2020   Acute lower UTI 10/16/2020   Tachypnea 10/16/2020   Dehydration 10/16/2020   Hypertensive urgency 10/16/2020   Intractable nausea and vomiting 10/16/2020   Abdominal pain 08/11/2019   PCP:  Delfino Lovett, FNP Pharmacy:   [id number] - [place], [place] - [address] [place] [address] Phone: [phone] Fax: [phone]  Social Determinants of Health (SDOH) Social History: SDOH Screenings   Food Insecurity: No Food Insecurity (06/26/2023)  Housing: Low Risk  (06/26/2023)  Transportation Needs: No Transportation Needs (06/26/2023)  Utilities: Not At Risk (06/26/2023)  Alcohol Screen: High Risk (06/01/2023)  Depression (PHQ2-9): High Risk (06/22/2023)  Social Connections: Unknown (11/19/2022)   [facility]  Tobacco Use: Low Risk  (06/26/2023)   SDOH Interventions:    Readmission Risk Interventions     06/28/2023  10:17 AM 06/06/2023    1:47 PM 10/23/2020   10:50 AM  Readmission Risk Prevention Plan  Transportation Screening Complete Complete Complete  PCP or Specialist Appt within 5-7 Days  Complete   Home Care Screening  Complete   Medication Review (RN CM)  Complete   Medication Review Oceanographer) Complete  Complete  PCP or Specialist appointment within 3-5 days of discharge   Complete  HRI or Home Care Consult Complete  Complete  SW Recovery Care/Counseling Consult Complete  Not Complete  Palliative Care Screening Not Applicable  Not Applicable  Skilled Nursing Facility Not Applicable  Not Applicable

## 2023-06-28 NOTE — Consult Note (Addendum)
Reason for Consult: Nausea vomiting Referring Physician: Hospital team  Marie Myers is an 32 y.o. female.  HPI: Patient seen and examined in her hospital computer chart reviewed and she had lots of dilations after her bypass but has not had one since she moved to [place] 4 years ago however in care everywhere she had 1 at [facility] in 21 without significant findings and a normal small bowel follow-through to follow and she says this is her second attack of nausea vomiting in the last month or 2 had not had any in a while and she denies any aspirin or nonsteroidals her family history is negative for any GI issues and she did have a significant pneumonia she says that pain was in her right chest and she has no other complaints  Past Medical History:  Diagnosis Date   Anxiety    Depression    H/O gastric bypass    Insomnia    Marijuana use 01/08/2021   Moderate alcohol use disorder (HCC) 01/08/2021    Past Surgical History:  Procedure Laterality Date   CHOLECYSTECTOMY     GASTRIC BYPASS     GASTRIC BYPASS     LAPAROSCOPY N/A 08/11/2019   Procedure: LAPAROSCOPY DIAGNOSTIC;  Surgeon: Harriette Bouillon, MD;  Location: [facility] OR;  Service: General;  Laterality: N/A;    Family History  Problem Relation Age of Onset   Diabetes Mother    Diabetes Father     Social History:  reports that she has never smoked. She has never used smokeless tobacco. She reports current alcohol use of about 1.0 standard drink of alcohol per week. She reports current drug use. Drug: Marijuana.  Allergies:  Allergies  Allergen Reactions   Zithromax [Azithromycin] Hives    Medications: I have reviewed the patient's current medications.  Results for orders placed or performed during the hospital encounter of 06/26/23 (from the past 48 hour(s))  Basic metabolic panel     Status: Abnormal   Collection Time: 06/27/23  4:54 AM  Result Value Ref Range   Sodium 139 135 - 145 mmol/L   Potassium 3.4 (L) 3.5 - 5.1  mmol/L   Chloride 107 98 - 111 mmol/L   CO2 25 22 - 32 mmol/L   Glucose, Bld 83 70 - 99 mg/dL    Comment: Glucose reference range applies only to samples taken after fasting for at least 8 hours.   BUN 5 (L) 6 - 20 mg/dL   Creatinine, Ser 9.56 0.44 - 1.00 mg/dL   Calcium 8.5 (L) 8.9 - 10.3 mg/dL   GFR, Estimated >21 >30 mL/min    Comment: (NOTE) Calculated using the CKD-EPI Creatinine Equation (2021)    Anion gap 7 5 - 15    Comment: Performed at [facility], [address] [place], [place] [address]  CBC     Status: Abnormal   Collection Time: 06/27/23  4:54 AM  Result Value Ref Range   WBC 11.8 (H) 4.0 - 10.5 K/uL   RBC 3.29 (L) 3.87 - 5.11 MIL/uL   Hemoglobin 11.9 (L) 12.0 - 15.0 g/dL   HCT 46.9 (L) 62.9 - 52.8 %   MCV 108.8 (H) 80.0 - 100.0 fL   MCH 36.2 (H) 26.0 - 34.0 pg   MCHC 33.2 30.0 - 36.0 g/dL   RDW 41.3 (H) 24.4 - 01.0 %   Platelets 319 150 - 400 K/uL   nRBC 0.0 0.0 - 0.2 %    Comment: Performed at [facility], [address] [place], [place] [address]  VITAMIN D 25 Hydroxy (Vit-D Deficiency, Fractures)     Status: Abnormal   Collection Time: 06/28/23  4:06 AM  Result Value Ref Range   Vit D, 25-Hydroxy 20.48 (L) 30 - 100 ng/mL    Comment: (NOTE) Vitamin D deficiency has been defined by the Institute of Medicine  and an Endocrine Society practice guideline as a level of serum 25-OH  vitamin D less than 20 ng/mL (1,2). The Endocrine Society went on to  further define vitamin D insufficiency as a level between 21 and 29  ng/mL (2).  1. IOM (Institute of Medicine). 2010. Dietary reference intakes for  calcium and D. [place]: [employer]. 2. Holick MF, Binkley Clay, Bischoff-Ferrari HA, et al. Evaluation,  treatment, and prevention of vitamin D deficiency: an Endocrine  Society clinical practice guideline, JCEM. 2011 Jul; 96(7): 1911-30.  Performed at [facility], [address]  [place], [place] [address]   CBC     Status: Abnormal   Collection Time: 06/28/23  4:06 AM  Result Value Ref Range   WBC 10.2 4.0 - 10.5 K/uL   RBC 2.87 (L) 3.87 - 5.11 MIL/uL   Hemoglobin 10.3 (L) 12.0 - 15.0 g/dL   HCT 96.2 (L) 95.2 - 84.1 %   MCV 112.5 (H) 80.0 - 100.0 fL   MCH 35.9 (H) 26.0 - 34.0 pg   MCHC 31.9 30.0 - 36.0 g/dL   RDW 32.4 (H) 40.1 - 02.7 %   Platelets 244 150 - 400 K/uL   nRBC 0.0 0.0 - 0.2 %    Comment: Performed at [facility], [address], [place], [place] [address]  Basic metabolic panel     Status: Abnormal   Collection Time: 06/28/23  4:06 AM  Result Value Ref Range   Sodium 140 135 - 145 mmol/L   Potassium 3.0 (L) 3.5 - 5.1 mmol/L   Chloride 104 98 - 111 mmol/L   CO2 25 22 - 32 mmol/L   Glucose, Bld 72 70 - 99 mg/dL    Comment: Glucose reference range applies only to samples taken after fasting for at least 8 hours.   BUN 8 6 - 20 mg/dL   Creatinine, Ser 4.40 0.44 - 1.00 mg/dL   Calcium 8.2 (L) 8.9 - 10.3 mg/dL   GFR, Estimated >34 >74 mL/min    Comment: (NOTE) Calculated using the CKD-EPI Creatinine Equation (2021)    Anion gap 11 5 - 15    Comment: Performed at [facility], [address] [place], [place] [address]  Magnesium     Status: None   Collection Time: 06/28/23  4:06 AM  Result Value Ref Range   Magnesium 1.9 1.7 - 2.4 mg/dL    Comment: Performed at [facility], [address] [place], [place] [address]  Phosphorus     Status: Abnormal   Collection Time: 06/28/23  4:06 AM  Result Value Ref Range   Phosphorus 4.7 (H) 2.5 - 4.6 mg/dL    Comment: Performed at [facility], [address], [place], [place] [address]    CT ABDOMEN PELVIS W CONTRAST  Result Date: 06/26/2023 CLINICAL DATA:  Abdominal pain. EXAM: CT ABDOMEN AND PELVIS WITH CONTRAST TECHNIQUE: Multidetector CT imaging of the abdomen and pelvis was performed using the standard protocol following  bolus administration of intravenous contrast. RADIATION DOSE REDUCTION: This exam was performed according to the  departmental dose-optimization program which includes automated exposure control, adjustment of the mA and/or kV according to patient size and/or use of iterative reconstruction technique. CONTRAST:  OMNIPAQUE IOHEXOL 300 MG/ML  SOLN COMPARISON:  Multiple prior imaging studies. The most recent CT scan is 06/03/2023 FINDINGS: Lower chest: The lung bases are clear of acute process. No pleural effusion or pulmonary lesions. The heart is normal in size. No pericardial effusion. Stable surgical changes along the GE junction and persistent small hiatal hernia. Hepatobiliary: No hepatic lesions or intrahepatic biliary dilatation. Stable hepatic steatosis. The gallbladder is surgically absent. Stable mild associated common bile duct dilatation. Pancreas: Unremarkable. No pancreatic ductal dilatation or surrounding inflammatory changes. Spleen: Normal in size without focal abnormality. Adrenals/Urinary Tract: The adrenal glands and kidneys are unremarkable. Contrast in the collecting systems and bladder from earlier chest CT. Stomach/Bowel: Stable surgical changes from gastric bypass surgery. The small bowel and colon are grossly normal. No obstructive findings. Vascular/Lymphatic: The aorta is normal in caliber. No dissection. The branch vessels are patent. The major venous structures are patent. No mesenteric or retroperitoneal mass or adenopathy. Small scattered lymph nodes are noted. Reproductive: No significant findings. Other: No pelvic mass or adenopathy. No free pelvic fluid collections. No inguinal mass or adenopathy. No abdominal wall hernia or subcutaneous lesions. Musculoskeletal: Stable left hip hardware and stable advanced bilateral hip joint degenerative changes for age. IMPRESSION: 1. No acute abdominal/pelvic findings, mass lesions or adenopathy. 2. Stable surgical changes from gastric  bypass surgery and persistent small hiatal hernia. 3. Status post cholecystectomy with stable mild associated common bile duct dilatation. 4. Stable advanced bilateral hip joint degenerative changes for age. Electronically Signed   By: Rudie Meyer M.D.   On: 06/26/2023 14:54    Review of Systems negative except above she says she has not lost much weight in the last month or 2 Blood pressure 102/64, pulse 62, temperature 97.7 F (36.5 C), temperature source Oral, resp. rate 17, height 5\' 6"  (1.676 m), weight 85.3 kg, last menstrual period 06/04/2023, SpO2 99%. 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 after midnight  Andri Prestia E 06/28/2023, 12:02 PM

## 2023-06-29 ENCOUNTER — Inpatient Hospital Stay (HOSPITAL_COMMUNITY): Payer: Medicaid Other | Admitting: Anesthesiology

## 2023-06-29 ENCOUNTER — Encounter (HOSPITAL_COMMUNITY): Payer: Self-pay | Admitting: Internal Medicine

## 2023-06-29 ENCOUNTER — Encounter (HOSPITAL_COMMUNITY)
Admission: EM | Disposition: A | Discharge status: Home / Self Care | Payer: Self-pay | Source: Home / Self Care | Attending: Internal Medicine

## 2023-06-29 ENCOUNTER — Ambulatory Visit (HOSPITAL_COMMUNITY): Payer: Medicaid Other

## 2023-06-29 ENCOUNTER — Encounter (HOSPITAL_COMMUNITY): Payer: Medicaid Other | Admitting: Psychiatry

## 2023-06-29 DIAGNOSIS — R112 Nausea with vomiting, unspecified: Secondary | ICD-10-CM | POA: Diagnosis not present

## 2023-06-29 DIAGNOSIS — I10 Essential (primary) hypertension: Secondary | ICD-10-CM | POA: Diagnosis not present

## 2023-06-29 DIAGNOSIS — K2100 Gastro-esophageal reflux disease with esophagitis, without bleeding: Secondary | ICD-10-CM | POA: Diagnosis not present

## 2023-06-29 HISTORY — PX: ESOPHAGOGASTRODUODENOSCOPY (EGD) WITH PROPOFOL: SHX5813

## 2023-06-29 LAB — CBC
HCT: 39.9 % (ref 36.0–46.0)
Hemoglobin: 13.6 g/dL (ref 12.0–15.0)
MCH: 36.7 pg — ABNORMAL HIGH (ref 26.0–34.0)
MCHC: 34.1 g/dL (ref 30.0–36.0)
MCV: 107.5 fL — ABNORMAL HIGH (ref 80.0–100.0)
Platelets: 323 10*3/uL (ref 150–400)
RBC: 3.71 MIL/uL — ABNORMAL LOW (ref 3.87–5.11)
RDW: 16.6 % — ABNORMAL HIGH (ref 11.5–15.5)
WBC: 27 10*3/uL — ABNORMAL HIGH (ref 4.0–10.5)
nRBC: 0 % (ref 0.0–0.2)

## 2023-06-29 LAB — BASIC METABOLIC PANEL
Anion gap: 14 (ref 5–15)
BUN: <5 mg/dL — ABNORMAL LOW (ref 6–20)
CO2: 23 mmol/L (ref 22–32)
Calcium: 8.9 mg/dL (ref 8.9–10.3)
Chloride: 99 mmol/L (ref 98–111)
Creatinine, Ser: 0.59 mg/dL (ref 0.44–1.00)
GFR, Estimated: >60 mL/min (ref >60)
Glucose, Bld: 119 mg/dL — ABNORMAL HIGH (ref 70–99)
Potassium: 4 mmol/L (ref 3.5–5.1)
Sodium: 136 mmol/L (ref 135–145)

## 2023-06-29 LAB — MAGNESIUM: Magnesium: 1.7 mg/dL (ref 1.7–2.4)

## 2023-06-29 LAB — PROCALCITONIN: Procalcitonin: <0.1 ng/mL

## 2023-06-29 LAB — PHOSPHORUS: Phosphorus: 2.5 mg/dL (ref 2.5–4.6)

## 2023-06-29 SURGERY — ESOPHAGOGASTRODUODENOSCOPY (EGD) WITH PROPOFOL
Anesthesia: Monitor Anesthesia Care

## 2023-06-29 MED ORDER — LABETALOL HCL 5 MG/ML IV SOLN
INTRAVENOUS | Status: AC
  Filled 2023-06-29: qty 4

## 2023-06-29 MED ORDER — ONDANSETRON HCL 4 MG/2ML IJ SOLN
INTRAMUSCULAR | Status: DC | PRN
  Administered 2023-06-29: 4 mg via INTRAVENOUS

## 2023-06-29 MED ORDER — LABETALOL HCL 5 MG/ML IV SOLN
INTRAVENOUS | Status: DC | PRN
  Administered 2023-06-29: 10 mg via INTRAVENOUS

## 2023-06-29 MED ORDER — DEXMEDETOMIDINE HCL IN NACL 80 MCG/20ML IV SOLN
INTRAVENOUS | Status: DC | PRN
  Administered 2023-06-29: 8 ug via INTRAVENOUS

## 2023-06-29 MED ORDER — SODIUM CHLORIDE 0.9 % IV SOLN
12.5000 mg | Freq: Four times a day (QID) | INTRAVENOUS | Status: DC | PRN
  Administered 2023-06-29 (×2): 12.5 mg via INTRAVENOUS
  Filled 2023-06-29: qty 12.5
  Filled 2023-06-29: qty 0.5
  Filled 2023-06-29: qty 12.5

## 2023-06-29 MED ORDER — VITAMIN D 25 MCG (1000 UNIT) PO TABS
1000.0000 [IU] | ORAL_TABLET | Freq: Every day | ORAL | Status: DC
  Administered 2023-06-30: 1000 [IU] via ORAL
  Filled 2023-06-29: qty 1

## 2023-06-29 MED ORDER — LABETALOL HCL 5 MG/ML IV SOLN
10.0000 mg | Freq: Once | INTRAVENOUS | Status: AC
  Administered 2023-06-29: 10 mg via INTRAVENOUS

## 2023-06-29 MED ORDER — SODIUM CHLORIDE 0.9 % IV SOLN: INTRAVENOUS | Status: DC | PRN

## 2023-06-29 MED ORDER — FENTANYL CITRATE (PF) 100 MCG/2ML IJ SOLN
INTRAMUSCULAR | Status: AC
  Filled 2023-06-29: qty 2

## 2023-06-29 MED ORDER — LIDOCAINE HCL (CARDIAC) PF 100 MG/5ML IV SOSY
PREFILLED_SYRINGE | INTRAVENOUS | Status: DC | PRN
  Administered 2023-06-29: 80 mg via INTRAVENOUS

## 2023-06-29 MED ORDER — PROPOFOL 500 MG/50ML IV EMUL
INTRAVENOUS | Status: DC | PRN
  Administered 2023-06-29: 155 ug/kg/min via INTRAVENOUS

## 2023-06-29 MED ORDER — FENTANYL CITRATE (PF) 100 MCG/2ML IJ SOLN
INTRAMUSCULAR | Status: DC | PRN
  Administered 2023-06-29: 50 ug via INTRAVENOUS

## 2023-06-29 SURGICAL SUPPLY — 15 items

## 2023-06-29 NOTE — Anesthesia Preprocedure Evaluation (Addendum)
Anesthesia Evaluation  Patient identified by MRN, date of birth, ID band Patient awake    Reviewed: Allergy & Precautions, H&P , NPO status , Patient's Chart, lab work & pertinent test results  Airway Mallampati: I  TM Distance: >3 FB Neck ROM: Full    Dental no notable dental hx. (+) Teeth Intact, Dental Advisory Given   Pulmonary neg pulmonary ROS   Pulmonary exam normal breath sounds clear to auscultation       Cardiovascular Exercise Tolerance: Good hypertension, Pt. on medications  Rhythm:Regular Rate:Normal     Neuro/Psych negative neurological ROS  negative psych ROS   GI/Hepatic negative GI ROS, Neg liver ROS,,,  Endo/Other  negative endocrine ROS    Renal/GU negative Renal ROS  negative genitourinary   Musculoskeletal   Abdominal   Peds  Hematology negative hematology ROS (+) Blood dyscrasia, anemia   Anesthesia Other Findings   Reproductive/Obstetrics negative OB ROS                             Anesthesia Physical Anesthesia Plan  ASA: 3  Anesthesia Plan: MAC   Post-op Pain Management: Minimal or no pain anticipated   Induction: Intravenous  PONV Risk Score and Plan: 4 or greater and Propofol infusion  Airway Management Planned: Natural Airway, Simple Face Mask and Mask  Additional Equipment: None  Intra-op Plan:   Post-operative Plan:   Informed Consent: I have reviewed the patients History and Physical, chart, labs and discussed the procedure including the risks, benefits and alternatives for the proposed anesthesia with the patient or authorized representative who has indicated his/her understanding and acceptance.     Dental advisory given  Plan Discussed with: CRNA and Anesthesiologist  Anesthesia Plan Comments:         Anesthesia Quick Evaluation

## 2023-06-29 NOTE — Progress Notes (Signed)
Responded to consult for IV. RN states pt is in shower.

## 2023-06-29 NOTE — Progress Notes (Signed)
Marie Myers 11:30 AM  Subjective: Patient seen and examined and she has no new complaints and she does not remember her 21 endoscopy at [facility] she did keep some soup down but then he said it made things worse later Objective: Vital signs stable afebrile no acute distress exam please see preassessment evaluation white count increased to 27 questionable etiology chemistries okay  Assessment: Nausea vomiting history of gastric bypass  Plan: Okay to proceed with endoscopy with possible balloon dilation of the anastomosis with anesthesia assistance  [facility]  office [phone] After 5PM or if no answer call [phone]

## 2023-06-29 NOTE — Progress Notes (Signed)
Dr. Miguel Rota at bedside to see patient regarding high blood pressure. Dr. Andres Ege gave 10 mg labetolol and assessing patient. Patient stated uncomfortable from abdominal pain and nausea. Patient just had nausea medicine on floor. DR. Odonno at bedside with patient and giving medicine himself for blood pressure.

## 2023-06-29 NOTE — Progress Notes (Signed)
PROGRESS NOTE    Marie Myers  ZOX:[id number] DOB: 1991-01-09 DOA: 06/26/2023 PCP: Delfino Lovett, FNP     Brief Narrative:  Marie Myers is a 32 y.o. female with medical history significant of anxiety, depression, status post gastric bypass, marijuana, alcohol usage who presents emergency department due to epigastric pain and dyspepsia.  Patient was recently treated for necrotizing pneumonia with Augmentin in October. She also had nausea vomiting at that time. Surgery was consulted and upper GI series was performed which did not show any leakage or perforation. She was planning to see them in outpatient setting. She finished her last dose of Augmentin yesterday and has since had worsening epigastric pain nausea and vomiting. She was taking CBD Gummies to assist with the nausea without relief. She has not smoked marijuana in about 2 weeks. Patient underwent a CT abdomen pelvis which showed no acute findings and stable surgical changes from gastric bypass surgery. She also has evidence of a cholecystectomy. CT chest demonstrated complete resolution of pneumonia. X-ray showed low lung volumes. Patient was admitted for further workup.  [facility] planning EGD today.        Assessment & Plan:     Principal Problem:   Intractable nausea and vomiting Active Problems:   Seizure disorder (HCC)   Abdominal pain   Alcohol use disorder, severe, dependence (HCC)   MDD (major depressive disorder), recurrent episode, severe (HCC)   Folate deficiency     Intractable nausea, vomiting History of gastric bypass 2017 [facility] Unfortunately patient has had multiple admissions in the past due to nausea vomiting and generalized abdominal pain.  Had endoscopy at [facility] in December 2021 which was unremarkable.  Upper GI series about a month ago in October was also negative.  CT abdomen pelvis does not show any acute pathology.  Stable gastric bypass changes. -Continue  antiemetics - [facility] planning EGD todAy with possible dilation   History of seizure IV Keppra   Recent history of necrotizing pneumonia -Completed Augmentin treatment -CT chest revealed near complete resolution right upper lobe airspace disease   Hypokalemia Replete as needed  Folic acid deficiency Vitamin D deficiency - Supplements   Mood disorder -Abilify   Alcohol use disorder Currently on alcohol withdrawal protocol.  Folic acid, multivitamin and thiamine   DVT prophylaxis: enoxaparin (LOVENOX) Code Status: Full Family Communication: Fianc at bedside Disposition Plan: Home Planning EGD  Subjective: No complaints doing well.  Tells me her nausea is slightly better today     Examination:  General exam: Appears calm and comfortable  Respiratory system: Clear to auscultation. Respiratory effort normal. No respiratory distress. No conversational dyspnea.  Cardiovascular system: S1 & S2 heard, RRR. No murmurs. No pedal edema. Gastrointestinal system: Abdomen is nondistended, soft and TTP periumbilical  Central nervous system: Alert and oriented. No focal neurological deficits. Speech clear.  Extremities: Symmetric in appearance  Skin: No rashes, lesions or ulcers on exposed skin            Diet Orders (From admission, onward)     Start     Ordered   06/29/23 0001  Diet NPO time specified Except for: Sips with Meds  Diet effective midnight       Question:  Except for  Answer:  Sips with Meds   06/28/23 1237            Objective: Vitals:   06/28/23 1557 06/28/23 2134 06/28/23 2218 06/29/23 0552  BP: (!) 167/107 (!) 187/106 [name])  176/113 112/69  Pulse: 80 (!) 51 (!) 50 94  Resp: 20 20  20   Temp: 98.6 F (37 C) 100 F (37.8 C)  99.5 F (37.5 C)  TempSrc: Oral Oral  Oral  SpO2: 100% 100%  100%  Weight:      Height:        Intake/Output Summary (Last 24 hours) at 06/29/2023 1051 Last data filed at 06/29/2023 0600 Gross per 24 hour  Intake  1872.01 ml  Output 2450 ml  Net -577.99 ml   Filed Weights   06/26/23 0446  Weight: 85.3 kg    Scheduled Meds:  ARIPiprazole  5 mg Oral Daily   cholecalciferol  1,000 Units Oral Daily   enoxaparin (LOVENOX) injection  40 mg Subcutaneous Q24H   folic acid  1 mg Oral Daily   lidocaine  1 patch Transdermal Q24H   multivitamin with minerals  1 tablet Oral Daily   pantoprazole (PROTONIX) IV  40 mg Intravenous Q12H   potassium chloride  40 mEq Oral Once   sertraline  100 mg Oral Daily   sucralfate  1 g Oral TID WC & HS   thiamine  100 mg Oral Daily   Or   thiamine  100 mg Intravenous Daily   traZODone  200 mg Oral QHS   Continuous Infusions:  dextrose 5 % and 0.45 % NaCl 75 mL/hr at 06/29/23 0812   levETIRAcetam 500 mg (06/29/23 0921)   promethazine (PHENERGAN) injection (IM or IVPB) 12.5 mg (06/29/23 1030)    Nutritional status     Body mass index is 30.34 kg/m.  Data Reviewed:   CBC: Recent Labs  Lab 06/26/23 0520 06/27/23 0454 06/28/23 0406 06/29/23 0431  WBC 14.3* 11.8* 10.2 27.0*  HGB 13.4 11.9* 10.3* 13.6  HCT 40.2 35.8* 32.3* 39.9  MCV 108.1* 108.8* 112.5* 107.5*  PLT 398 319 244 323   Basic Metabolic Panel: Recent Labs  Lab 06/26/23 0520 06/27/23 0454 06/28/23 0406 06/29/23 0431  NA 138 139 140 136  K 3.6 3.4* 3.0* 4.0  CL 101 107 104 99  CO2 21* 25 25 23   GLUCOSE 150* 83 72 119*  BUN <5* 5* 8 <5*  CREATININE 0.59 0.65 0.65 0.59  CALCIUM 9.9 8.5* 8.2* 8.9  MG  --   --  1.9 1.7  PHOS  --   --  4.7* 2.5   GFR: Estimated Creatinine Clearance: 112.1 mL/min (by C-G formula based on SCr of 0.59 mg/dL). Liver Function Tests: Recent Labs  Lab 06/26/23 0520  AST 16  ALT 15  ALKPHOS 79  BILITOT 0.9  PROT 8.1  ALBUMIN 4.2   Recent Labs  Lab 06/26/23 0520  LIPASE 24   No results for input(s): "AMMONIA" in the last 168 hours. Coagulation Profile: No results for input(s): "INR", "PROTIME" in the last 168 hours. Cardiac Enzymes: No  results for input(s): "CKTOTAL", "CKMB", "CKMBINDEX", "TROPONINI" in the last 168 hours. BNP (last 3 results) No results for input(s): "PROBNP" in the last 8760 hours. HbA1C: No results for input(s): "HGBA1C" in the last 72 hours. CBG: No results for input(s): "GLUCAP" in the last 168 hours. Lipid Profile: No results for input(s): "CHOL", "HDL", "LDLCALC", "TRIG", "CHOLHDL", "LDLDIRECT" in the last 72 hours. Thyroid Function Tests: No results for input(s): "TSH", "T4TOTAL", "FREET4", "T3FREE", "THYROIDAB" in the last 72 hours. Anemia Panel: No results for input(s): "VITAMINB12", "FOLATE", "FERRITIN", "TIBC", "IRON", "RETICCTPCT" in the last 72 hours. Sepsis Labs: Recent Labs  Lab 06/29/23 0403  PROCALCITON <  0.10    Recent Results (from the past 240 hour(s))  Resp panel by RT-PCR (RSV, Flu A&B, Covid) Anterior Nasal Swab     Status: None   Collection Time: 06/26/23 11:30 AM   Specimen: Anterior Nasal Swab  Result Value Ref Range Status   SARS Coronavirus 2 by RT PCR NEGATIVE NEGATIVE Final    Comment: (NOTE) SARS-CoV-2 target nucleic acids are NOT DETECTED.  The SARS-CoV-2 RNA is generally detectable in upper respiratory specimens during the acute phase of infection. The lowest concentration of SARS-CoV-2 viral copies this assay can detect is 138 copies/mL. A negative result does not preclude SARS-Cov-2 infection and should not be used as the sole basis for treatment or other patient management decisions. A negative result may occur with  improper specimen collection/handling, submission of specimen other than nasopharyngeal swab, presence of viral mutation(s) within the areas targeted by this assay, and inadequate number of viral copies(<138 copies/mL). A negative result must be combined with clinical observations, patient history, and epidemiological information. The expected result is Negative.  Fact Sheet for Patients:  BloggerCourse.com  Fact  Sheet for Healthcare Providers:  SeriousBroker.it  This test is no t yet approved or cleared by the [place] FDA and  has been authorized for detection and/or diagnosis of SARS-CoV-2 by FDA under an Emergency Use Authorization (EUA). This EUA will remain  in effect (meaning this test can be used) for the duration of the COVID-19 declaration under Section 564(b)(1) of the Act, 21 U.S.C.section 360bbb-3(b)(1), unless the authorization is terminated  or revoked sooner.       Influenza A by PCR NEGATIVE NEGATIVE Final   Influenza B by PCR NEGATIVE NEGATIVE Final    Comment: (NOTE) The Xpert Xpress SARS-CoV-2/FLU/RSV plus assay is intended as an aid in the diagnosis of influenza from Nasopharyngeal swab specimens and should not be used as a sole basis for treatment. Nasal washings and aspirates are unacceptable for Xpert Xpress SARS-CoV-2/FLU/RSV testing.  Fact Sheet for Patients: BloggerCourse.com  Fact Sheet for Healthcare Providers: SeriousBroker.it  This test is not yet approved or cleared by the [place] FDA and has been authorized for detection and/or diagnosis of SARS-CoV-2 by FDA under an Emergency Use Authorization (EUA). This EUA will remain in effect (meaning this test can be used) for the duration of the COVID-19 declaration under Section 564(b)(1) of the Act, 21 U.S.C. section 360bbb-3(b)(1), unless the authorization is terminated or revoked.     Resp Syncytial Virus by PCR NEGATIVE NEGATIVE Final    Comment: (NOTE) Fact Sheet for Patients: BloggerCourse.com  Fact Sheet for Healthcare Providers: SeriousBroker.it  This test is not yet approved or cleared by the [place] FDA and has been authorized for detection and/or diagnosis of SARS-CoV-2 by FDA under an Emergency Use Authorization (EUA). This EUA will remain in effect  (meaning this test can be used) for the duration of the COVID-19 declaration under Section 564(b)(1) of the Act, 21 U.S.C. section 360bbb-3(b)(1), unless the authorization is terminated or revoked.  Performed at [facility], [address] [place], [place] [address]          Radiology Studies: No results found.         LOS: 2 days   Time spent= 35 mins    Miguel Rota, MD [facility]  If 7PM-7AM, please contact night-coverage  06/29/2023, 10:51 AM

## 2023-06-29 NOTE — Progress Notes (Signed)
Dr. Tacy Dura at bedside giving labetolol and fentanyl to patient and assessing her for elevated blood pressure.

## 2023-06-29 NOTE — Transfer of Care (Signed)
Immediate Anesthesia Transfer of Care Note  Patient: Marie Myers  Procedure(s) Performed: Procedure(s) with comments: ESOPHAGOGASTRODUODENOSCOPY (EGD) WITH PROPOFOL (N/A) - Possible balloon dilation  Patient Location: Endoscopy Unit  Anesthesia Type:MAC  Level of Consciousness:  sedated, patient cooperative and responds to stimulation  Airway & Oxygen Therapy:Patient Spontanous Breathing and Patient connected to face mask oxgen  Post-op Assessment:  Report given to PACU RN and Post -op Vital signs reviewed and stable  Post vital signs:  Reviewed and stable  Last Vitals:  Vitals:   06/29/23 1145 06/29/23 1147  BP:  (!) 147/99  Pulse: 98 95  Resp: 18 17  Temp:    SpO2: 97% 93%    Complications: No apparent anesthesia complications

## 2023-06-29 NOTE — Anesthesia Postprocedure Evaluation (Signed)
Anesthesia Post Note  Patient: Marie Myers  Procedure(s) Performed: ESOPHAGOGASTRODUODENOSCOPY (EGD) WITH PROPOFOL     Patient location during evaluation: PACU Anesthesia Type: MAC Level of consciousness: awake and alert Pain management: pain level controlled Vital Signs Assessment: post-procedure vital signs reviewed and stable Respiratory status: spontaneous breathing, nonlabored ventilation, respiratory function stable and patient connected to nasal cannula oxygen Cardiovascular status: stable and blood pressure returned to baseline Postop Assessment: no apparent nausea or vomiting Anesthetic complications: no   No notable events documented.  Last Vitals:  Vitals:   06/29/23 1220 06/29/23 1230  BP: (!) 98/56 112/66  Pulse: (!) 101 97  Resp: 20 18  Temp:    SpO2: 100% 98%    Last Pain:  Vitals:   06/29/23 1230  TempSrc:   PainSc: 0-No pain                 Isidora Laham

## 2023-06-29 NOTE — Plan of Care (Signed)

## 2023-06-29 NOTE — Op Note (Signed)
[facility] Patient Name: Marie Myers Procedure Date: 06/29/2023 MRN: [id number] Attending MD: Vida Rigger , MD, [id number] Date of Birth: Jul 27, 1991 CSN: [id number] Age: 32 Admit Type: Inpatient Procedure:                Upper GI endoscopy Indications:              Nausea with vomiting Providers:                Vida Rigger, MD, Marge Duncans, RN, Rozetta Nunnery, Technician Referring MD:              Medicines:                Monitored Anesthesia Care Complications:            No immediate complications. Estimated Blood Loss:     Estimated blood loss: none. Procedure:                Pre-Anesthesia Assessment:                           - Prior to the procedure, a History and Physical                            was performed, and patient medications and                            allergies were reviewed. The patient's tolerance of                            previous anesthesia was also reviewed. The risks                            and benefits of the procedure and the sedation                            options and risks were discussed with the patient.                            All questions were answered, and informed consent                            was obtained. Prior Anticoagulants: The patient has                            taken no anticoagulant or antiplatelet agents. ASA                            Grade Assessment: II - A patient with mild systemic                            disease. After reviewing the risks and benefits,  the patient was deemed in satisfactory condition to                            undergo the procedure.                           After obtaining informed consent, the endoscope was                            passed under direct vision. Throughout the                            procedure, the patient's blood pressure, pulse, and                            oxygen saturations were  monitored continuously. The                            GIF-H190 ([id number] Olympus endoscope was introduced                            through the mouth, and advanced to the jejunum. The                            upper GI endoscopy was accomplished without                            difficulty. The patient tolerated the procedure                            well. Scope In: Scope Out: Findings:      The larynx was normal.      Non-severe esophagitis with no bleeding was found.      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 This was       characterized by healthy appearing mucosa. There was no sign of       stricturing or even anastomotic narrowing      The examined jejunum was normal.      The cardia and gastric fundus were normal on retroflexion. Impression:               - Normal larynx.                           - Non-severe reflux esophagitis with no bleeding.                           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                           - Normal examined jejunum.                           -  No specimens collected. Moderate Sedation:      Not Applicable - Patient had care per Anesthesia. Recommendation:           - Clear liquid diet today.                           - Continue present medications. Would explain                            gastroparesis and made worse with marijuana and                            narcotics and I think that is what is the cause of                            her nausea and vomiting                           - Return to GI clinic PRN.                           - Telephone GI clinic if symptomatic PRN. Procedure Code(s):        --- Professional ---                           [phone], Esophagogastroduodenoscopy, flexible,                            transoral; diagnostic, including collection of                            specimen(s) by brushing or washing, when  performed                            (separate procedure) Diagnosis Code(s):        --- Professional ---                           K21.00, Gastro-esophageal reflux disease with                            esophagitis, without bleeding                           Z98.84, Bariatric surgery status                           R11.2, Nausea with vomiting, unspecified CPT copyright 2022 [employer]. All rights reserved. The codes documented in this report are preliminary and upon coder review may  be revised to meet current compliance requirements. Vida Rigger, MD 06/29/2023 12:23:24 PM This report has been signed electronically. Number of Addenda: 0

## 2023-06-30 ENCOUNTER — Encounter (HOSPITAL_COMMUNITY): Payer: Self-pay | Admitting: Gastroenterology

## 2023-06-30 DIAGNOSIS — R1010 Upper abdominal pain, unspecified: Secondary | ICD-10-CM

## 2023-06-30 DIAGNOSIS — R112 Nausea with vomiting, unspecified: Secondary | ICD-10-CM | POA: Diagnosis not present

## 2023-06-30 DIAGNOSIS — G40909 Epilepsy, unspecified, not intractable, without status epilepticus: Secondary | ICD-10-CM | POA: Diagnosis not present

## 2023-06-30 LAB — BASIC METABOLIC PANEL
Anion gap: 8 (ref 5–15)
BUN: <5 mg/dL — ABNORMAL LOW (ref 6–20)
CO2: 27 mmol/L (ref 22–32)
Calcium: 8.3 mg/dL — ABNORMAL LOW (ref 8.9–10.3)
Chloride: 104 mmol/L (ref 98–111)
Creatinine, Ser: 0.61 mg/dL (ref 0.44–1.00)
GFR, Estimated: >60 mL/min (ref >60)
Glucose, Bld: 69 mg/dL — ABNORMAL LOW (ref 70–99)
Potassium: 3 mmol/L — ABNORMAL LOW (ref 3.5–5.1)
Sodium: 139 mmol/L (ref 135–145)

## 2023-06-30 LAB — CBC
HCT: 36 % (ref 36.0–46.0)
Hemoglobin: 11.7 g/dL — ABNORMAL LOW (ref 12.0–15.0)
MCH: 36.3 pg — ABNORMAL HIGH (ref 26.0–34.0)
MCHC: 32.5 g/dL (ref 30.0–36.0)
MCV: 111.8 fL — ABNORMAL HIGH (ref 80.0–100.0)
Platelets: 234 K/uL (ref 150–400)
RBC: 3.22 MIL/uL — ABNORMAL LOW (ref 3.87–5.11)
RDW: 17.2 % — ABNORMAL HIGH (ref 11.5–15.5)
WBC: 21.6 K/uL — ABNORMAL HIGH (ref 4.0–10.5)
nRBC: 0 % (ref 0.0–0.2)

## 2023-06-30 LAB — MAGNESIUM: Magnesium: 1.6 mg/dL — ABNORMAL LOW (ref 1.7–2.4)

## 2023-06-30 MED ORDER — PROMETHAZINE HCL 25 MG PO TABS
25.0000 mg | ORAL_TABLET | Freq: Four times a day (QID) | ORAL | 1 refills | Status: DC | PRN
Start: 2023-06-30 — End: 2023-09-24

## 2023-06-30 MED ORDER — PANTOPRAZOLE SODIUM 40 MG PO TBEC: 40.0000 mg | DELAYED_RELEASE_TABLET | Freq: Two times a day (BID) | ORAL | 1 refills | Status: DC

## 2023-06-30 MED ORDER — VITAMIN D3 25 MCG PO TABS: 1000.0000 [IU] | ORAL_TABLET | Freq: Every day | ORAL | 3 refills | Status: DC

## 2023-06-30 MED ORDER — FOLIC ACID 1 MG PO TABS: 1.0000 mg | ORAL_TABLET | Freq: Every day | ORAL | 3 refills | Status: DC

## 2023-06-30 MED ORDER — SUCRALFATE 1 GM/10ML PO SUSP: 1.0000 g | Freq: Three times a day (TID) | ORAL | 0 refills | Status: DC

## 2023-06-30 MED ORDER — OXYCODONE HCL 5 MG PO TABS: 5.0000 mg | ORAL_TABLET | Freq: Four times a day (QID) | ORAL | 0 refills | Status: DC | PRN

## 2023-06-30 NOTE — Discharge Summary (Signed)
Physician Discharge Summary   Patient: Marie Myers MRN: [id number] DOB: 07-20-1991  Admit date:     06/26/2023  Discharge date: 06/30/23  Discharge Physician: Thad Ranger, MD   PCP: Delfino Lovett, FNP   Recommendations at discharge:   Continue Protonix 40 mg twice daily Carafate 1 g p.o. 3 times daily with meals and at bedtime  Discharge Diagnoses:    Intractable nausea and vomiting   Seizure disorder (HCC)   Abdominal pain   Alcohol use disorder, severe, dependence (HCC)   MDD (major depressive disorder), recurrent episode, severe (HCC)   Folate deficiency  Hospital Course:  Patient is a a 32 y.o. female with medical history significant of anxiety, depression, status post gastric bypass, marijuana, alcohol usage who presents emergency department due to epigastric pain and dyspepsia.  Patient was recently treated for necrotizing pneumonia with Augmentin in October. She also had nausea vomiting at that time. Surgery was consulted and upper GI series was performed which did not show any leakage or perforation. She was planning to see them in outpatient setting. She finished her last dose of Augmentin yesterday and has since had worsening epigastric pain nausea and vomiting. She was taking CBD Gummies to assist with the nausea without relief. She has not smoked marijuana in about 2 weeks. Patient underwent a CT abdomen pelvis which showed no acute findings and stable surgical changes from gastric bypass surgery. She also has evidence of a cholecystectomy. CT chest demonstrated complete resolution of pneumonia. X-ray showed low lung volumes. Patient was admitted for further workup.   Assessment and Plan:  Intractable nausea, vomiting History of gastric bypass 2017 [facility] -patient has had multiple admissions in the past due to nausea vomiting and generalized abdominal pain.  Had endoscopy at [facility] in December 2021 which was unremarkable.  Upper GI  series about a month ago in October was also negative.  CT abdomen pelvis does not show any acute pathology.  Stable gastric bypass changes. - Patient was seen by gastroenterology, underwent EGD on 11/5, which showed nonsevere reflux esophagitis with no bleeding, gastric bypass found with healthy-appearing mucosa, no stricture or anastomotic narrowing.  Normal jejunum. -Outpatient follow-up with GI -Patient was placed on clear liquid diet post EGD and now tolerating soft diet without any difficulty.    History of seizure Continue Keppra   Recent history of necrotizing pneumonia -Completed Augmentin treatment -CT chest revealed near complete resolution right upper lobe airspace disease     Folic acid deficiency Vitamin D deficiency - Vitamin D level 20.48, placed on vitamin D supplementation   Mood disorder -Continue Abilify   Alcohol use disorder Placed on alcohol withdrawal protocol, folic acid multivitamin and thiamine.  Currently stable, not in any acute withdrawals.     Leukocytosis Unclear etiology, procalcitonin less than 0.1 Flu negative, RSV negative, COVID-negative CT abdomen showed no acute abdominal or pelvic findings.  CT chest has shown no new or progressive disease, near complete resolution of right upper lobe airspace disease.     Pain control - [employer] Controlled Substance Reporting System database was reviewed. and patient was instructed, not to drive, operate heavy machinery, perform activities at heights, swimming or participation in water activities or provide baby-sitting services while on Pain, Sleep and Anxiety Medications; until their outpatient Physician has advised to do so again. Also recommended to not to take more than prescribed Pain, Sleep and Anxiety Medications.  Consultants: GI Procedures performed: EGD Disposition: Home Diet recommendation: Soft diet  DISCHARGE MEDICATION: Allergies as of 06/30/2023       Reactions   Zithromax  [azithromycin] Hives        Medication List     TAKE these medications    acetaminophen 325 MG tablet Commonly known as: TYLENOL Take 2 tablets (650 mg total) by mouth every 6 (six) hours as needed for mild pain (pain score 1-3) or fever.   ARIPiprazole 5 MG tablet Commonly known as: ABILIFY Take 1 tablet (5 mg total) by mouth daily.   folic acid 1 MG tablet Commonly known as: FOLVITE Take 1 tablet (1 mg total) by mouth daily. Start taking on: July 01, 2023   levETIRAcetam 500 MG tablet Commonly known as: KEPPRA Take 1 tablet (500 mg total) by mouth 2 (two) times daily.   oxyCODONE 5 MG immediate release tablet Commonly known as: Oxy IR/ROXICODONE Take 1 tablet (5 mg total) by mouth every 6 (six) hours as needed for moderate pain (pain score 4-6) or severe pain (pain score 7-10). What changed: reasons to take this   pantoprazole 40 MG tablet Commonly known as: PROTONIX Take 1 tablet (40 mg total) by mouth 2 (two) times daily before a meal. What changed: when to take this   promethazine 25 MG tablet Commonly known as: PHENERGAN Take 1 tablet (25 mg total) by mouth every 6 (six) hours as needed for nausea or vomiting.   sertraline 100 MG tablet Commonly known as: ZOLOFT Take 100 mg by mouth in the morning and at bedtime.   sucralfate 1 GM/10ML suspension Commonly known as: CARAFATE Take 10 mLs (1 g total) by mouth 4 (four) times daily -  with meals and at bedtime. Can substitute to tablets if solution is not available.   traZODone 100 MG tablet Commonly known as: DESYREL Take 2 tablets (200 mg total) by mouth at bedtime.   vitamin D3 25 MCG tablet Commonly known as: CHOLECALCIFEROL Take 1 tablet (1,000 Units total) by mouth daily. Start taking on: July 01, 2023        Discharge Exam: [name] Weights   06/26/23 0446 06/29/23 1112  Weight: 85.3 kg 85.3 kg   S: No acute complaints, tolerated soft diet without any difficulty, looking forward to go  home.   BP 118/71 (BP Location: Right Arm)   Pulse 79   Temp 97.7 F (36.5 C) (Oral)   Resp 16   Ht 5\' 6"  (1.676 m)   Wt 85.3 kg   LMP 06/04/2023 (Approximate)   SpO2 98%   BMI 30.35 kg/m   Physical Exam General: Alert and oriented x 3, NAD Cardiovascular: S1 S2 clear, RRR.  Respiratory: CTAB, no wheezing, rales or rhonchi Gastrointestinal: Soft, nontender, nondistended, NBS Ext: no pedal edema bilaterally Neuro: no new deficits Psych: Normal affect   Condition at discharge: fair  The results of significant diagnostics from this hospitalization (including imaging, microbiology, ancillary and laboratory) are listed below for reference.   Imaging Studies: CT ABDOMEN PELVIS W CONTRAST  Result Date: 06/26/2023 CLINICAL DATA:  Abdominal pain. EXAM: CT ABDOMEN AND PELVIS WITH CONTRAST TECHNIQUE: Multidetector CT imaging of the abdomen and pelvis was performed using the standard protocol following bolus administration of intravenous contrast. RADIATION DOSE REDUCTION: This exam was performed according to the departmental dose-optimization program which includes automated exposure control, adjustment of the mA and/or kV according to patient size and/or use of iterative reconstruction technique. CONTRAST:  OMNIPAQUE IOHEXOL 300 MG/ML  SOLN COMPARISON:  Multiple prior imaging studies. The most recent  CT scan is 06/03/2023 FINDINGS: Lower chest: The lung bases are clear of acute process. No pleural effusion or pulmonary lesions. The heart is normal in size. No pericardial effusion. Stable surgical changes along the GE junction and persistent small hiatal hernia. Hepatobiliary: No hepatic lesions or intrahepatic biliary dilatation. Stable hepatic steatosis. The gallbladder is surgically absent. Stable mild associated common bile duct dilatation. Pancreas: Unremarkable. No pancreatic ductal dilatation or surrounding inflammatory changes. Spleen: Normal in size without focal abnormality.  Adrenals/Urinary Tract: The adrenal glands and kidneys are unremarkable. Contrast in the collecting systems and bladder from earlier chest CT. Stomach/Bowel: Stable surgical changes from gastric bypass surgery. The small bowel and colon are grossly normal. No obstructive findings. Vascular/Lymphatic: The aorta is normal in caliber. No dissection. The branch vessels are patent. The major venous structures are patent. No mesenteric or retroperitoneal mass or adenopathy. Small scattered lymph nodes are noted. Reproductive: No significant findings. Other: No pelvic mass or adenopathy. No free pelvic fluid collections. No inguinal mass or adenopathy. No abdominal wall hernia or subcutaneous lesions. Musculoskeletal: Stable left hip hardware and stable advanced bilateral hip joint degenerative changes for age. IMPRESSION: 1. No acute abdominal/pelvic findings, mass lesions or adenopathy. 2. Stable surgical changes from gastric bypass surgery and persistent small hiatal hernia. 3. Status post cholecystectomy with stable mild associated common bile duct dilatation. 4. Stable advanced bilateral hip joint degenerative changes for age. Electronically Signed   By: Rudie Meyer M.D.   On: 06/26/2023 14:54   CT Chest W Contrast  Result Date: 06/26/2023 CLINICAL DATA:  Right-sided chest pain and shortness of breath. Cavitary pneumonia. EXAM: CT CHEST WITH CONTRAST TECHNIQUE: Multidetector CT imaging of the chest was performed during intravenous contrast administration. RADIATION DOSE REDUCTION: This exam was performed according to the departmental dose-optimization program which includes automated exposure control, adjustment of the mA and/or kV according to patient size and/or use of iterative reconstruction technique. CONTRAST:  75mL OMNIPAQUE IOHEXOL 300 MG/ML  SOLN COMPARISON:  Chest CTA on 06/03/2023 FINDINGS: Cardiovascular:  No acute findings. Mediastinum/Nodes: No masses or pathologically enlarged lymph nodes  identified. Lungs/Pleura: Airspace disease with central cavitation in the right upper lobe has nearly completely resolved since previous study. Subpulmonic air-fluid collection in the lateral right lower lobe has also resolved. Mild residual atelectasis or scarring noted. No new areas of pulmonary opacity are seen. No evidence of pleural effusion. Upper Abdomen: Previous gastric bypass surgery and small hiatal hernia again noted. Significantly decreased hepatic steatosis since prior study. Musculoskeletal:  No suspicious bone lesions. IMPRESSION: Near complete resolution of right upper lobe airspace disease with central cavitation since prior study. Resolution of subpulmonic air-fluid collection in lateral right lower lobe. No new or progressive disease. Stable small hiatal hernia and previous gastric bypass surgery. Significantly decreased hepatic steatosis since prior study. Electronically Signed   By: Danae Orleans M.D.   On: 06/26/2023 11:46   DG Chest 2 View  Result Date: 06/26/2023 CLINICAL DATA:  Chest pain. EXAM: CHEST - 2 VIEW COMPARISON:  06/03/2023 FINDINGS: Low volume film. The lungs are clear without focal pneumonia, edema, pneumothorax or pleural effusion. The cardiopericardial silhouette is within normal limits for size. No acute bony abnormality. Telemetry leads overlie the chest. IMPRESSION: Low volume film without acute cardiopulmonary findings. Electronically Signed   By: Kennith Center M.D.   On: 06/26/2023 05:40   DG UGI W SINGLE CM (SOL OR THIN BA)  Result Date: 06/04/2023 CLINICAL DATA:  History of gastric bypass and right-sided necrotizing  pneumonia. Rule out esophageal leak. EXAM: WATER SOLUBLE UPPER GI SERIES TECHNIQUE: Single-column upper GI series was performed using water soluble contrast. Radiation Exposure Index (as provided by the fluoroscopic device): 14.5 mGy Kerma CONTRAST:  50 cc of Omnipaque 300 COMPARISON:  Yesterday's chest abdomen and pelvic CTs FINDINGS: Patient was  unable to tolerate oblique or prone imaging. Single-contrast focused exam was performed with the patient supine. Preprocedure scout film is unremarkable. Left proximal femur fixation. Degenerative changes of the right hip are significantly age advanced. 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 Normal esophageal caliber. No significant hiatal hernia. Contrast stasis throughout the esophagus suggests a component of dysmotility. No contrast extravasation to suggest esophageal perforation. IMPRESSION: No evidence of esophageal perforation or significant hiatal hernia. Limited single-contrast exam performed with the patient supine secondary to immobility as detailed above. Electronically Signed   By: Jeronimo Greaves M.D.   On: 06/04/2023 15:46   CT ABDOMEN PELVIS W CONTRAST  Result Date: 06/03/2023 CLINICAL DATA:  Acute abdominal pain. EXAM: CT ABDOMEN AND PELVIS WITH CONTRAST TECHNIQUE: Multidetector CT imaging of the abdomen and pelvis was performed using the standard protocol following bolus administration of intravenous contrast. RADIATION DOSE REDUCTION: This exam was performed according to the departmental dose-optimization program which includes automated exposure control, adjustment of the mA and/or kV according to patient size and/or use of iterative reconstruction technique. CONTRAST:  OMNIPAQUE IOHEXOL 350 MG/ML SOLN COMPARISON:  CT 02/11/2023 FINDINGS: Lower chest: Assessed on concurrent chest CT, reported separately. Hepatobiliary: Advanced hepatic steatosis. More focal fatty infiltration adjacent to the falciform ligament. The liver is enlarged spanning 19.6 cm cranial caudal. Clips in the gallbladder fossa postcholecystectomy. No biliary dilatation. Pancreas: Unremarkable. No pancreatic ductal dilatation or surrounding inflammatory changes. Spleen: Normal in size without focal abnormality. Adrenals/Urinary Tract: No adrenal nodule. No hydronephrosis, renal inflammation or focal renal  abnormality. Moderately distended bladder, normal for degree of distension. 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 The excluded gastric remnant is minimally distended with fluid. The Roux limb is nondilated. The jejunal anastomosis is unremarkable. There is no bowel obstruction or inflammatory change. Small volume of stool in the colon. Normal appendix visualized. Vascular/Lymphatic: No acute vascular findings. Normal caliber abdominal aorta. Patent portal vein. No abdominopelvic adenopathy. Reproductive: Uterus and bilateral adnexa are unremarkable. Other: No free air, free fluid, or intra-abdominal fluid collection. Musculoskeletal: There are no acute or suspicious osseous abnormalities. Surgical screw traverses the left intertrochanteric femur. Chronic changes of the bilateral hips. IMPRESSION: 1. No acute abnormality in the abdomen/pelvis. 2. Hepatomegaly and advanced hepatic steatosis. 3. Bariatric surgery with gastric suture line crossing the diaphragmatic hiatus and small hiatal hernia. Electronically Signed   By: Narda Rutherford M.D.   On: 06/03/2023 22:52   CT Angio Chest PE W and/or Wo Contrast  Result Date: 06/03/2023 CLINICAL DATA:  Pulmonary embolism (PE) suspected, high prob Right-sided pain. EXAM: CT ANGIOGRAPHY CHEST WITH CONTRAST TECHNIQUE: Multidetector CT imaging of the chest was performed using the standard protocol during bolus administration of intravenous contrast. Multiplanar CT image reconstructions and MIPs were obtained to evaluate the vascular anatomy. RADIATION DOSE REDUCTION: This exam was performed according to the departmental dose-optimization program which includes automated exposure control, adjustment of the mA and/or kV according to patient size and/or use of iterative reconstruction technique. CONTRAST:  OMNIPAQUE IOHEXOL 350 MG/ML SOLN COMPARISON:  Chest radiograph earlier today.  Chest CT  10/16/2020 FINDINGS: Cardiovascular: There are no filling  defects within the pulmonary arteries to suggest pulmonary embolus. Evaluation is diagnostic to the segmental level. The subsegmental branches are not well assessed due to phase of contrast. Mild dilatation of the main pulmonary artery at 3 cm. Thoracic aorta is normal in caliber. Common origin of the brachiocephalic and left common carotid artery, variant anatomy. The heart is upper normal in size. No pericardial effusion. Mediastinum/Nodes: Prominent right hilar node at 10 mm. No mediastinal adenopathy. There is a small hiatal hernia. Bariatric surgery with gastric suture line crossing the diaphragmatic hiatus. Lungs/Pleura: Elongated cavitary process in the periphery of the right upper and right lower lobe. Above the major fissure there is a peripheral consolidative opacity with surrounding ground-glass and central focus of air. This extends inferiorly in the subpleural space into the right lower lobe where there is a sub pulmonic air-fluid collection measuring 4.5 x 1.9 x 7 cm, series 12, image 69 there is also a small dependent right pleural effusion. The left lung is clear. There are no features of pulmonary edema. Trachea and central airways are clear. Upper Abdomen: Assessed on concurrent abdominopelvic CT, reported separately. Musculoskeletal: There are no acute or suspicious osseous abnormalities. No rib fracture, destructive or erosive changes, particularly of right ribs. Review of the MIP images confirms the above findings. IMPRESSION: 1. No pulmonary embolus. 2. Elongated cavitary process in the periphery of the right upper and right lower lobe. Right upper lobe peripheral consolidative opacity with surrounding ground-glass and central focus of air. This extends inferiorly in the subpleural space into the right lower lobe where there is a sub pulmonic air-fluid collection measuring 4.5 x 1.9 x 7 cm. There is also a small dependent right pleural  effusion. Findings are most consistent with necrotizing pneumonia. Recommend follow-up imaging to resolution. 3. Prominent right hilar node at 10 mm, likely reactive. 4. Small hiatal hernia. Bariatric surgery with gastric suture line crossing the diaphragmatic hiatus. 5. Mild dilatation of the main pulmonary artery, can be seen with pulmonary arterial hypertension. Electronically Signed   By: Narda Rutherford M.D.   On: 06/03/2023 22:49   DG Chest 2 View  Result Date: 06/03/2023 CLINICAL DATA:  Chest pain. EXAM: CHEST - 2 VIEW COMPARISON:  02/11/2023. FINDINGS: Bilateral lung fields are clear. There is trace right pleural effusion, new since the prior study. Left lateral costophrenic angle is clear. No pneumothorax on either side. Normal cardio-mediastinal silhouette. No acute osseous abnormalities. The soft tissues are within normal limits. IMPRESSION: *New trace right pleural effusion. Electronically Signed   By: Jules Schick M.D.   On: 06/03/2023 15:12    Microbiology: Results for orders placed or performed during the hospital encounter of 06/26/23  Resp panel by RT-PCR (RSV, Flu A&B, Covid) Anterior Nasal Swab     Status: None   Collection Time: 06/26/23 11:30 AM   Specimen: Anterior Nasal Swab  Result Value Ref Range Status   SARS Coronavirus 2 by RT PCR NEGATIVE NEGATIVE Final    Comment: (NOTE) SARS-CoV-2 target nucleic acids are NOT DETECTED.  The SARS-CoV-2 RNA is generally detectable in upper respiratory specimens during the acute phase of infection. The lowest concentration of SARS-CoV-2 viral copies this assay can detect is 138 copies/mL. A negative result does not preclude SARS-Cov-2 infection and should not be used as the sole basis for treatment or other patient management decisions. A negative result may occur with  improper specimen collection/handling, submission of specimen other than nasopharyngeal swab, presence of viral mutation(s) within the areas targeted by  this  assay, and inadequate number of viral copies(<138 copies/mL). A negative result must be combined with clinical observations, patient history, and epidemiological information. The expected result is Negative.  Fact Sheet for Patients:  BloggerCourse.com  Fact Sheet for Healthcare Providers:  SeriousBroker.it  This test is no t yet approved or cleared by the [place] FDA and  has been authorized for detection and/or diagnosis of SARS-CoV-2 by FDA under an Emergency Use Authorization (EUA). This EUA will remain  in effect (meaning this test can be used) for the duration of the COVID-19 declaration under Section 564(b)(1) of the Act, 21 U.S.C.section 360bbb-3(b)(1), unless the authorization is terminated  or revoked sooner.       Influenza A by PCR NEGATIVE NEGATIVE Final   Influenza B by PCR NEGATIVE NEGATIVE Final    Comment: (NOTE) The Xpert Xpress SARS-CoV-2/FLU/RSV plus assay is intended as an aid in the diagnosis of influenza from Nasopharyngeal swab specimens and should not be used as a sole basis for treatment. Nasal washings and aspirates are unacceptable for Xpert Xpress SARS-CoV-2/FLU/RSV testing.  Fact Sheet for Patients: BloggerCourse.com  Fact Sheet for Healthcare Providers: SeriousBroker.it  This test is not yet approved or cleared by the [place] FDA and has been authorized for detection and/or diagnosis of SARS-CoV-2 by FDA under an Emergency Use Authorization (EUA). This EUA will remain in effect (meaning this test can be used) for the duration of the COVID-19 declaration under Section 564(b)(1) of the Act, 21 U.S.C. section 360bbb-3(b)(1), unless the authorization is terminated or revoked.     Resp Syncytial Virus by PCR NEGATIVE NEGATIVE Final    Comment: (NOTE) Fact Sheet for Patients: BloggerCourse.com  Fact Sheet for  Healthcare Providers: SeriousBroker.it  This test is not yet approved or cleared by the [place] FDA and has been authorized for detection and/or diagnosis of SARS-CoV-2 by FDA under an Emergency Use Authorization (EUA). This EUA will remain in effect (meaning this test can be used) for the duration of the COVID-19 declaration under Section 564(b)(1) of the Act, 21 U.S.C. section 360bbb-3(b)(1), unless the authorization is terminated or revoked.  Performed at [facility], [address] [place], [place] [address]     Labs: CBC: Recent Labs  Lab 06/26/23 0520 06/27/23 0454 06/28/23 0406 06/29/23 0431 06/30/23 0435  WBC 14.3* 11.8* 10.2 27.0* 21.6*  HGB 13.4 11.9* 10.3* 13.6 11.7*  HCT 40.2 35.8* 32.3* 39.9 36.0  MCV 108.1* 108.8* 112.5* 107.5* 111.8*  PLT 398 319 244 323 234   Basic Metabolic Panel: Recent Labs  Lab 06/26/23 0520 06/27/23 0454 06/28/23 0406 06/29/23 0431 06/30/23 0435  NA 138 139 140 136 139  K 3.6 3.4* 3.0* 4.0 3.0*  CL 101 107 104 99 104  CO2 21* 25 25 23 27   GLUCOSE 150* 83 72 119* 69*  BUN <5* 5* 8 <5* <5*  CREATININE 0.59 0.65 0.65 0.59 0.61  CALCIUM 9.9 8.5* 8.2* 8.9 8.3*  MG  --   --  1.9 1.7 1.6*  PHOS  --   --  4.7* 2.5  --    Liver Function Tests: Recent Labs  Lab 06/26/23 0520  AST 16  ALT 15  ALKPHOS 79  BILITOT 0.9  PROT 8.1  ALBUMIN 4.2   CBG: No results for input(s): "GLUCAP" in the last 168 hours.  Discharge time spent: greater than 30 minutes.  Signed: Thad Ranger, MD [facility] 06/30/2023

## 2023-06-30 NOTE — Progress Notes (Signed)
Dr. Tacy Dura still at bedside at 1145. Patients blood pressure still elevated and states she is in pain though starting to feel better. Dr. Tacy Dura gives patient 2nd does of of Fentanyl IV for pain and blood pressure control. Continue to monitor patient.

## 2023-06-30 NOTE — Progress Notes (Signed)
AVS reviewed w/ pt who verbalized an understanding. No other questions at this time - Keppra infusion needs to be completed.

## 2023-06-30 NOTE — Progress Notes (Signed)
PIV removed - pt to lobby via w/c - home w/ spouse

## 2023-07-01 LAB — VITAMIN B1: Vitamin B1 (Thiamine): 100.2 nmol/L (ref 66.5–200.0)

## 2023-07-13 ENCOUNTER — Ambulatory Visit
Admission: RE | Admit: 2023-07-13 | Discharge: 2023-07-13 | Disposition: A | Discharge status: Home / Self Care | Payer: Medicaid Other | Source: Ambulatory Visit | Attending: Acute Care | Admitting: Acute Care

## 2023-07-13 DIAGNOSIS — J850 Gangrene and necrosis of lung: Secondary | ICD-10-CM

## 2023-08-13 ENCOUNTER — Telehealth (HOSPITAL_COMMUNITY): Payer: Self-pay | Admitting: *Deleted

## 2023-08-13 NOTE — Telephone Encounter (Signed)
Fax received for approval of Aripiprazole 5mg . Pharmacy notified.

## 2023-08-17 ENCOUNTER — Institutional Professional Consult (permissible substitution): Payer: Medicaid Other | Admitting: Pulmonary Disease

## 2023-08-31 ENCOUNTER — Telehealth: Payer: Self-pay | Admitting: Neurology

## 2023-08-31 NOTE — Telephone Encounter (Signed)
 Pt scheduled appt

## 2023-09-07 ENCOUNTER — Ambulatory Visit (INDEPENDENT_AMBULATORY_CARE_PROVIDER_SITE_OTHER): Payer: Medicaid Other | Admitting: Neurology

## 2023-09-07 ENCOUNTER — Encounter: Payer: Self-pay | Admitting: Neurology

## 2023-09-07 ENCOUNTER — Telehealth: Payer: Self-pay

## 2023-09-07 VITALS — BP 120/70 | HR 88 | Ht 66.0 in | Wt 178.0 lb

## 2023-09-07 DIAGNOSIS — F32A Depression, unspecified: Secondary | ICD-10-CM

## 2023-09-07 DIAGNOSIS — F419 Anxiety disorder, unspecified: Secondary | ICD-10-CM | POA: Diagnosis not present

## 2023-09-07 DIAGNOSIS — Z789 Other specified health status: Secondary | ICD-10-CM | POA: Diagnosis not present

## 2023-09-07 DIAGNOSIS — R569 Unspecified convulsions: Secondary | ICD-10-CM

## 2023-09-07 MED ORDER — GABAPENTIN 300 MG PO CAPS: 300.0000 mg | ORAL_CAPSULE | Freq: Three times a day (TID) | ORAL | 3 refills | Status: DC

## 2023-09-07 MED ORDER — OXCARBAZEPINE 300 MG PO TABS: 300.0000 mg | ORAL_TABLET | Freq: Two times a day (BID) | ORAL | 3 refills | Status: DC

## 2023-09-07 NOTE — Telephone Encounter (Signed)
 Call to patient who reports picking up the gabapentin  and trileptal  and taking the first dose. About 25 minutes later she had a generalized convulsion in the store. She did bite her tongue but did not hit her head. Reviewed seizure triggers and patient and fiance verbalized understanding.   I verbally spoke with Dr. Gregg and he advises to continue medications as prescribed.  Call back to patient and reviewed recommendations and both verbalized understanding

## 2023-09-07 NOTE — Progress Notes (Signed)
 [facility]  PATIENT: Marie Myers DOB: [birth year]  REQUESTING CLINICIAN: Omar Cumins, FNP HISTORY FROM: Patient and chart review  REASON FOR VISIT: Seizure   HISTORICAL  CHIEF COMPLAINT:  Chief Complaint  Patient presents with   Follow-up    Pt in 12, here with husband [name] Pt is here following up on seizures. Pt states she's had 3 seizures within the last month. Pt states she does not feel them coming, they just happen randomly. States she stays confused for a day or two then she feels back to normal. States can not tell the difference on the type of seizures she has. Pt states that she went about 2 weeks without meds, she was not able to get her meds refill, but now she continues taking Keppra  every day.   INTERVAL HISTORY 09/07/2023:  Patient presents today for follow-up, she is accompanied by her husband.  Last visit was in January 2024.  She tells me since then,  she continued to have seizures.  Back in February, she presented to the ED after a seizure.  At discharge her gabapentin  was switched to Keppra .  She has been taking Keppra  as recommended twice daily but continued to have seizures.  She tells me with the Keppra  she does have increased irritability and anger.  Currently she is undergoing a lot of stress, tells me she is not in a good space at the moment. She reports that her last seizure was 3 nights ago and prior to that was 2 weeks ago.  The seizure 3 nights ago was the first time that she had out of sleep.  The one 2 weeks ago was after an argument with her mother.  Husband tells me that some of her seizures are stress-induced. Denies any major injuries or urinary incontinence with the seizures, only tongue biting.  Patient tells me that she continues to drink, usually drink about 3 glasses of wine nightly.  She does not drink beer or alcohol, wine only. She does have a psychiatrist but states that currently she is under a lot of stress but she is  willing to commit herself to a mental health facility and also to rehab for alcohol cessation.     HISTORY OF PRESENT ILLNESS:  This is a 33 year old woman past medical history including hypertension, anxiety, depression, who is presenting after being admitted to the hospital for seizure.  Patient reports on December 30, she did have a total of 3 seizures described as generalized convulsion.  Seizure associated with tongue biting and urinary incontinence.  She presented to the ED, had a MRI and EEG which did not show any abnormality.  Her EEG was possibly related to alcohol withdrawal as she does report drinking and her last drink was 4 days prior to presentation. She was previously on Gabapentin  300 mg TID and plan was for patient to increase it to 600 mg TID.  On today's exam, patient reported she remembers going to work and the next thing that she remembered is waking up in the back of the ambulance.  Still her hospitalization is not clear to her.  Since leaving the hospital she has not had any additional seizures but she continues to drink alcohol daily, she reported her last drink was 2 days ago.  She has not been taking her Gabapentin  as directed because per patient, it is not helpful. She does drink hard liquor and also wine.  She reports that she drinks alcohol to help her with sleep.  She does complain of insomnia.  She does not regularly see a psychiatrist or therapist but go to the [facility] to get her meds refilled.   Handedness: Right handed   Onset: August 22 2022  Seizure Type: Generalized convulsion   Current frequency: Only once   Any injuries from seizures: Tongue biting   Seizure risk factors: Alcohol use disorder otherwise no other reported   Previous ASMs: Gabapentin  but for anxiety and mental illness   Currenty ASMs: Keppra  500 mg BID  ASMs side effects: Denies   Brain Images: Normal   Previous EEGs: Normal    OTHER MEDICAL CONDITIONS:  Anxiety/Depression, Hypertension, Alcohol use disorder   REVIEW OF SYSTEMS: Full 14 system review of systems performed and negative with exception of: As noted in the HPI   ALLERGIES: Allergies  Allergen Reactions   Zithromax [Azithromycin] Hives    HOME MEDICATIONS: Outpatient Medications Prior to Visit  Medication Sig Dispense Refill   acetaminophen  (TYLENOL ) 325 MG tablet Take 2 tablets (650 mg total) by mouth every 6 (six) hours as needed for mild pain (pain score 1-3) or fever.     ARIPiprazole  (ABILIFY ) 5 MG tablet Take 1 tablet (5 mg total) by mouth daily. 30 tablet 3   cholecalciferol  (CHOLECALCIFEROL ) 25 MCG tablet Take 1 tablet (1,000 Units total) by mouth daily. 30 tablet 3   folic acid  (FOLVITE ) 1 MG tablet Take 1 tablet (1 mg total) by mouth daily. 30 tablet 3   pantoprazole  (PROTONIX ) 40 MG tablet Take 1 tablet (40 mg total) by mouth 2 (two) times daily before a meal. 60 tablet 1   promethazine  (PHENERGAN ) 25 MG tablet Take 1 tablet (25 mg total) by mouth every 6 (six) hours as needed for nausea or vomiting. 30 tablet 1   sertraline  (ZOLOFT ) 100 MG tablet Take 100 mg by mouth in the morning and at bedtime.     traZODone  (DESYREL ) 100 MG tablet Take 2 tablets (200 mg total) by mouth at bedtime. 60 tablet 3   levETIRAcetam  (KEPPRA ) 500 MG tablet Take 1 tablet (500 mg total) by mouth 2 (two) times daily. 60 tablet 1   oxyCODONE  (OXY IR/ROXICODONE ) 5 MG immediate release tablet Take 1 tablet (5 mg total) by mouth every 6 (six) hours as needed for moderate pain (pain score 4-6) or severe pain (pain score 7-10). (Patient not taking: Reported on 09/07/2023) 20 tablet 0   sucralfate  (CARAFATE ) 1 GM/10ML suspension Take 10 mLs (1 g total) by mouth 4 (four) times daily -  with meals and at bedtime. Can substitute to tablets if solution is not available. 1200 mL 0   No facility-administered medications prior to visit.    PAST MEDICAL HISTORY: Past Medical History:  Diagnosis Date    Anxiety    Depression    H/O gastric bypass    Insomnia    Marijuana use 01/08/2021   Moderate alcohol use disorder (HCC) 01/08/2021    PAST SURGICAL HISTORY: Past Surgical History:  Procedure Laterality Date   CHOLECYSTECTOMY     ESOPHAGOGASTRODUODENOSCOPY (EGD) WITH PROPOFOL  N/A 06/29/2023   Procedure: ESOPHAGOGASTRODUODENOSCOPY (EGD) WITH PROPOFOL ;  Surgeon: Rosalie Kitchens, MD;  Location: [facility];  Service: Gastroenterology;  Laterality: N/A;  Possible balloon dilation   GASTRIC BYPASS     GASTRIC BYPASS     LAPAROSCOPY N/A 08/11/2019   Procedure: LAPAROSCOPY DIAGNOSTIC;  Surgeon: Vanderbilt Ned, MD;  Location: [facility] OR;  Service: General;  Laterality: N/A;    FAMILY HISTORY: Family History  Problem Relation Age of Onset   Diabetes Mother    Diabetes Father     SOCIAL HISTORY: Social History   Socioeconomic History   Marital status: Single    Spouse name: Not on file   Number of children: Not on file   Years of education: Not on file   Highest education level: Not on file  Occupational History   Not on file  Tobacco Use   Smoking status: Never   Smokeless tobacco: Never  Vaping Use   Vaping status: Every Day   Substances: Nicotine , Flavoring  Substance and Sexual Activity   Alcohol use: Yes    Alcohol/week: 1.0 standard drink of alcohol    Types: 1 Glasses of wine per week   Drug use: Yes    Types: Marijuana   Sexual activity: Not on file  Other Topics Concern   Not on file  Social History Narrative   Not on file   Social Drivers of Health   Financial Resource Strain: Not on file  Food Insecurity: No Food Insecurity (06/26/2023)   Hunger Vital Sign    Worried About Running Out of Food in the Last Year: Never true    Ran Out of Food in the Last Year: Never true  Transportation Needs: No Transportation Needs (06/26/2023)   PRAPARE - Administrator, Civil Service (Medical): No    Lack of Transportation (Non-Medical): No  Physical Activity:  Not on file  Stress: Not on file  Social Connections: Unknown (11/19/2022)   Received from [facility]   Social Network    Social Network: Not on file  Intimate Partner Violence: Not At Risk (06/26/2023)   Humiliation, Afraid, Rape, and Kick questionnaire    Fear of Current or Ex-Partner: No    Emotionally Abused: No    Physically Abused: No    Sexually Abused: No    PHYSICAL EXAM  GENERAL EXAM/CONSTITUTIONAL: Vitals:  Vitals:   09/07/23 1118  BP: 120/70  Pulse: 88  Weight: 178 lb (80.7 kg)  Height: 5' 6 (1.676 m)   Body mass index is 28.73 kg/m. Wt Readings from Last 3 Encounters:  09/07/23 178 lb (80.7 kg)  06/29/23 188 lb 0.8 oz (85.3 kg)  06/03/23 178 lb (80.7 kg)   Patient is in no distress; well developed, nourished and groomed; neck is supple  MUSCULOSKELETAL: Gait, strength, tone, movements noted in Neurologic exam below  NEUROLOGIC: MENTAL STATUS:      No data to display         awake, alert, oriented to person, place and time recent and remote memory intact normal attention and concentration language fluent, comprehension intact, naming intact fund of knowledge appropriate  CRANIAL NERVE:  2nd, 3rd, 4th, 6th - Visual fields full to confrontation, extraocular muscles intact, no nystagmus 5th - facial sensation symmetric 7th - facial strength symmetric 8th - hearing intact 9th - palate elevates symmetrically, uvula midline 11th - shoulder shrug symmetric 12th - tongue protrusion midline  MOTOR:  normal bulk and tone, full strength in the BUE, BLE  SENSORY:  normal and symmetric to light touch  COORDINATION:  finger-nose-finger, fine finger movements normal  GAIT/STATION:  normal   DIAGNOSTIC DATA (LABS, IMAGING, TESTING) - I reviewed patient records, labs, notes, testing and imaging myself where available.  Lab Results  Component Value Date   WBC 21.6 (H) 06/30/2023   HGB 11.7 (L) 06/30/2023   HCT 36.0 06/30/2023    MCV 111.8 (H) 06/30/2023  PLT 234 06/30/2023      Component Value Date/Time   NA 139 06/30/2023 0435   K 3.0 (L) 06/30/2023 0435   CL 104 06/30/2023 0435   CO2 27 06/30/2023 0435   GLUCOSE 69 (L) 06/30/2023 0435   BUN <5 (L) 06/30/2023 0435   CREATININE 0.61 06/30/2023 0435   CALCIUM 8.3 (L) 06/30/2023 0435   PROT 8.1 06/26/2023 0520   ALBUMIN 4.2 06/26/2023 0520   AST 16 06/26/2023 0520   ALT 15 06/26/2023 0520   ALKPHOS 79 06/26/2023 0520   BILITOT 0.9 06/26/2023 0520   GFRNONAA >60 06/30/2023 0435   GFRAA >60 12/27/2019 0810   Lab Results  Component Value Date   CHOL 177 01/12/2022   HDL 119 01/12/2022   LDLCALC 50 01/12/2022   TRIG 42 01/12/2022   Lab Results  Component Value Date   HGBA1C 4.5 (L) 01/12/2022   Lab Results  Component Value Date   VITAMINB12 217 06/06/2023   Lab Results  Component Value Date   TSH 2.288 06/07/2023    Routine EEG 08/23/2022 This study is within normal limits. No seizures or epileptiform discharges were seen throughout the recording.   MRI Brain 08/23/2022 Motion degraded examination, terminated prior to completion by patient request. Within this limitation, normal MRI of the brain.    ASSESSMENT AND PLAN  33 y.o. year old female  with medical conditions including alcohol abuse, hypertension, anxiety and depression who is presenting for follow-up for her seizures.  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  So far her EEGs, LTM and routine EEG have been normal.  She is on Keppra  500 mg twice daily but does report increased irritability, anger, and there is ongoing depression and anxiety.  I do not believe that Keppra  may not be the right medication for patient.  Will switch it to oxcarbazepine  300 mg twice daily.  She was previously on gabapentin  for anxiety, will restart patient on gabapentin  300 mg 3 times daily.  I have advised her to  contact me if she does have any side effect from the medications or any worsening seizures otherwise I will see her in 6 months for follow-up.  Both patient and husband voiced understanding. In terms of the alcohol abuse, she tells me that she drink 3 glasses of wine every night, no liquor, no beer.  She tells me that she wants to go to rehab but currently she is not in a good space, is under a lot of stress but once her current situation improves, she will check herself into a mental health/rehab place.  I will see her in 6 months for follow-up or sooner if worse.   1. Seizures (HCC)   2. Alcohol use   3. Anxiety   4. Depression, unspecified depression type      Patient Instructions  Discontinue levetiracetam  Start oxcarbazepine  300 mg twice daily Start gabapentin  300 mg 3 times daily Continue your other medications Continue follow-up PCP and psychiatrist Return in 6 months or sooner if worse.   Per [place]  DMV statutes, patients with seizures are not allowed to drive until they have been seizure-free for six months.  Other recommendations include using caution when using heavy equipment or power tools. Avoid working on ladders or at heights. Take showers instead of baths.  Do not swim alone.  Ensure the water  temperature is not too high on the home water  heater. Do not go swimming alone. Do  not lock yourself in a room alone (i.e. bathroom). When caring for infants or small children, sit down when holding, feeding, or changing them to minimize risk of injury to the child in the event you have a seizure. Maintain good sleep hygiene. Avoid alcohol.  Also recommend adequate sleep, hydration, good diet and minimize stress.   During the Seizure  - First, ensure adequate ventilation and place patients on the floor on their left side  Loosen clothing around the neck and ensure the airway is patent. If the patient is clenching the teeth, do not force the mouth open with any object as this  can cause severe damage - Remove all items from the surrounding that can be hazardous. The patient may be oblivious to what's happening and may not even know what he or she is doing. If the patient is confused and wandering, either gently guide him/her away and block access to outside areas - Reassure the individual and be comforting - Call 911. In most cases, the seizure ends before EMS arrives. However, there are cases when seizures may last over 3 to 5 minutes. Or the individual may have developed breathing difficulties or severe injuries. If a pregnant patient or a person with diabetes develops a seizure, it is prudent to call an ambulance. - Finally, if the patient does not regain full consciousness, then call EMS. Most patients will remain confused for about 45 to 90 minutes after a seizure, so you must use judgment in calling for help. - Avoid restraints but make sure the patient is in a bed with padded side rails - Place the individual in a lateral position with the neck slightly flexed; this will help the saliva drain from the mouth and prevent the tongue from falling backward - Remove all nearby furniture and other hazards from the area - Provide verbal assurance as the individual is regaining consciousness - Provide the patient with privacy if possible - Call for help and start treatment as ordered by the caregiver   After the Seizure (Postictal Stage)  After a seizure, most patients experience confusion, fatigue, muscle pain and/or a headache. Thus, one should permit the individual to sleep. For the next few days, reassurance is essential. Being calm and helping reorient the person is also of importance.  Most seizures are painless and end spontaneously. Seizures are not harmful to others but can lead to complications such as stress on the lungs, brain and the heart. Individuals with prior lung problems may develop labored breathing and respiratory distress.     No orders of the  defined types were placed in this encounter.   Meds ordered this encounter  Medications   Oxcarbazepine  (TRILEPTAL ) 300 MG tablet    Sig: Take 1 tablet (300 mg total) by mouth 2 (two) times daily.    Dispense:  180 tablet    Refill:  3   gabapentin  (NEURONTIN ) 300 MG capsule    Sig: Take 1 capsule (300 mg total) by mouth 3 (three) times daily.    Dispense:  270 capsule    Refill:  3    Return in about 6 months (around 03/06/2024).  I have spent a total of 55 minutes dedicated to this patient today, preparing to see patient, performing a medically appropriate examination and evaluation, ordering tests and/or medications and procedures, and counseling and educating the patient/family/caregiver; independently interpreting result and communicating results to the family/patient/caregiver; and documenting clinical information in the electronic medical record.   Pastor Falling, MD 09/07/2023,  1:12 PM  [facility] [address], [address] [place], [place] [address] [phone]

## 2023-09-07 NOTE — Patient Instructions (Signed)
 Discontinue levetiracetam Start oxcarbazepine 300 mg twice daily Start gabapentin 300 mg 3 times daily Continue your other medications Continue follow-up PCP and psychiatrist Return in 6 months or sooner if worse.

## 2023-09-18 ENCOUNTER — Other Ambulatory Visit: Payer: Self-pay

## 2023-09-18 ENCOUNTER — Emergency Department (HOSPITAL_COMMUNITY): Payer: Medicaid Other

## 2023-09-18 ENCOUNTER — Inpatient Hospital Stay (HOSPITAL_COMMUNITY)
Admission: EM | Admit: 2023-09-18 | Discharge: 2023-09-21 | DRG: 392 | Discharge status: Against Medical Advice | Payer: Medicaid Other | Attending: Internal Medicine | Admitting: Internal Medicine

## 2023-09-18 DIAGNOSIS — Z9884 Bariatric surgery status: Secondary | ICD-10-CM

## 2023-09-18 DIAGNOSIS — G40909 Epilepsy, unspecified, not intractable, without status epilepticus: Secondary | ICD-10-CM | POA: Diagnosis not present

## 2023-09-18 DIAGNOSIS — E860 Dehydration: Secondary | ICD-10-CM | POA: Diagnosis present

## 2023-09-18 DIAGNOSIS — Z888 Allergy status to other drugs, medicaments and biological substances status: Secondary | ICD-10-CM

## 2023-09-18 DIAGNOSIS — K56609 Unspecified intestinal obstruction, unspecified as to partial versus complete obstruction: Secondary | ICD-10-CM

## 2023-09-18 DIAGNOSIS — E872 Acidosis, unspecified: Secondary | ICD-10-CM | POA: Diagnosis present

## 2023-09-18 DIAGNOSIS — F1020 Alcohol dependence, uncomplicated: Secondary | ICD-10-CM | POA: Diagnosis not present

## 2023-09-18 DIAGNOSIS — R7401 Elevation of levels of liver transaminase levels: Secondary | ICD-10-CM | POA: Diagnosis not present

## 2023-09-18 DIAGNOSIS — I10 Essential (primary) hypertension: Secondary | ICD-10-CM | POA: Diagnosis present

## 2023-09-18 DIAGNOSIS — Z833 Family history of diabetes mellitus: Secondary | ICD-10-CM

## 2023-09-18 DIAGNOSIS — R824 Acetonuria: Secondary | ICD-10-CM | POA: Diagnosis present

## 2023-09-18 DIAGNOSIS — R1013 Epigastric pain: Principal | ICD-10-CM | POA: Diagnosis present

## 2023-09-18 DIAGNOSIS — G8929 Other chronic pain: Secondary | ICD-10-CM | POA: Diagnosis present

## 2023-09-18 DIAGNOSIS — Z79899 Other long term (current) drug therapy: Secondary | ICD-10-CM

## 2023-09-18 DIAGNOSIS — E8729 Other acidosis: Secondary | ICD-10-CM | POA: Diagnosis present

## 2023-09-18 DIAGNOSIS — F419 Anxiety disorder, unspecified: Secondary | ICD-10-CM | POA: Diagnosis present

## 2023-09-18 DIAGNOSIS — G4700 Insomnia, unspecified: Secondary | ICD-10-CM | POA: Diagnosis present

## 2023-09-18 DIAGNOSIS — K760 Fatty (change of) liver, not elsewhere classified: Secondary | ICD-10-CM | POA: Diagnosis present

## 2023-09-18 DIAGNOSIS — R112 Nausea with vomiting, unspecified: Secondary | ICD-10-CM | POA: Diagnosis not present

## 2023-09-18 DIAGNOSIS — F332 Major depressive disorder, recurrent severe without psychotic features: Secondary | ICD-10-CM | POA: Diagnosis present

## 2023-09-18 DIAGNOSIS — Z881 Allergy status to other antibiotic agents status: Secondary | ICD-10-CM

## 2023-09-18 LAB — BLOOD GAS, VENOUS
Acid-Base Excess: 0.8 mmol/L (ref 0.0–2.0)
Bicarbonate: 25.3 mmol/L (ref 20.0–28.0)
O2 Saturation: 72.8 %
Patient temperature: 37
pCO2, Ven: 39 mm[Hg] — ABNORMAL LOW (ref 44–60)
pH, Ven: 7.42 (ref 7.25–7.43)
pO2, Ven: 44 mm[Hg] (ref 32–45)

## 2023-09-18 LAB — URINALYSIS, ROUTINE W REFLEX MICROSCOPIC
Bilirubin Urine: NEGATIVE
Glucose, UA: NEGATIVE mg/dL
Hgb urine dipstick: NEGATIVE
Ketones, ur: 5 mg/dL — AB
Leukocytes,Ua: NEGATIVE
Nitrite: NEGATIVE
Protein, ur: NEGATIVE mg/dL
Specific Gravity, Urine: 1.013 (ref 1.005–1.030)
pH: 6 (ref 5.0–8.0)

## 2023-09-18 LAB — BASIC METABOLIC PANEL
Anion gap: 15 (ref 5–15)
BUN: <5 mg/dL — ABNORMAL LOW (ref 6–20)
CO2: 21 mmol/L — ABNORMAL LOW (ref 22–32)
Calcium: 8.9 mg/dL (ref 8.9–10.3)
Chloride: 103 mmol/L (ref 98–111)
Creatinine, Ser: 0.47 mg/dL (ref 0.44–1.00)
GFR, Estimated: >60 mL/min (ref >60)
Glucose, Bld: 127 mg/dL — ABNORMAL HIGH (ref 70–99)
Potassium: 3.8 mmol/L (ref 3.5–5.1)
Sodium: 139 mmol/L (ref 135–145)

## 2023-09-18 LAB — CBC
HCT: 37 % (ref 36.0–46.0)
Hemoglobin: 12.5 g/dL (ref 12.0–15.0)
MCH: 36.4 pg — ABNORMAL HIGH (ref 26.0–34.0)
MCHC: 33.8 g/dL (ref 30.0–36.0)
MCV: 107.9 fL — ABNORMAL HIGH (ref 80.0–100.0)
Platelets: 208 10*3/uL (ref 150–400)
RBC: 3.43 MIL/uL — ABNORMAL LOW (ref 3.87–5.11)
RDW: 17.3 % — ABNORMAL HIGH (ref 11.5–15.5)
WBC: 10 10*3/uL (ref 4.0–10.5)
nRBC: 0 % (ref 0.0–0.2)

## 2023-09-18 LAB — LIPASE, BLOOD: Lipase: 25 U/L (ref 11–51)

## 2023-09-18 LAB — I-STAT CG4 LACTIC ACID, ED
Lactic Acid, Venous: 2.3 mmol/L (ref 0.5–1.9)
Lactic Acid, Venous: 1.4 mmol/L (ref 0.5–1.9)

## 2023-09-18 LAB — BETA-HYDROXYBUTYRIC ACID: Beta-Hydroxybutyric Acid: 1.32 mmol/L — ABNORMAL HIGH (ref 0.05–0.27)

## 2023-09-18 LAB — COMPREHENSIVE METABOLIC PANEL
ALT: 84 U/L — ABNORMAL HIGH (ref 0–44)
AST: 150 U/L — ABNORMAL HIGH (ref 15–41)
Albumin: 4.3 g/dL (ref 3.5–5.0)
Alkaline Phosphatase: 83 U/L (ref 38–126)
Anion gap: 18 — ABNORMAL HIGH (ref 5–15)
BUN: 5 mg/dL — ABNORMAL LOW (ref 6–20)
CO2: 17 mmol/L — ABNORMAL LOW (ref 22–32)
Calcium: 8.7 mg/dL — ABNORMAL LOW (ref 8.9–10.3)
Chloride: 102 mmol/L (ref 98–111)
Creatinine, Ser: 0.42 mg/dL — ABNORMAL LOW (ref 0.44–1.00)
GFR, Estimated: >60 mL/min (ref >60)
Glucose, Bld: 113 mg/dL — ABNORMAL HIGH (ref 70–99)
Potassium: 5.6 mmol/L — ABNORMAL HIGH (ref 3.5–5.1)
Sodium: 137 mmol/L (ref 135–145)
Total Bilirubin: 0.5 mg/dL (ref 0.0–1.2)
Total Protein: 8.2 g/dL — ABNORMAL HIGH (ref 6.5–8.1)

## 2023-09-18 LAB — HCG, SERUM, QUALITATIVE: Preg, Serum: NEGATIVE

## 2023-09-18 MED ORDER — LORAZEPAM 2 MG/ML IJ SOLN
1.0000 mg | Freq: Once | INTRAMUSCULAR | Status: AC
  Administered 2023-09-18: 1 mg via INTRAVENOUS
  Filled 2023-09-18: qty 1

## 2023-09-18 MED ORDER — ONDANSETRON HCL 4 MG/2ML IJ SOLN
4.0000 mg | Freq: Once | INTRAMUSCULAR | Status: AC
  Administered 2023-09-18: 4 mg via INTRAVENOUS
  Filled 2023-09-18: qty 2

## 2023-09-18 MED ORDER — LACTATED RINGERS IV BOLUS
1000.0000 mL | Freq: Once | INTRAVENOUS | Status: AC
  Administered 2023-09-18: 1000 mL via INTRAVENOUS

## 2023-09-18 MED ORDER — HYDROMORPHONE HCL 1 MG/ML IJ SOLN
1.0000 mg | Freq: Once | INTRAMUSCULAR | Status: AC
  Administered 2023-09-18: 1 mg via INTRAVENOUS
  Filled 2023-09-18: qty 1

## 2023-09-18 MED ORDER — IOHEXOL 300 MG/ML  SOLN
100.0000 mL | Freq: Once | INTRAMUSCULAR | Status: AC | PRN
  Administered 2023-09-18: 100 mL via INTRAVENOUS

## 2023-09-18 NOTE — ED Notes (Signed)
Unsuccessful IV start x2.

## 2023-09-18 NOTE — ED Triage Notes (Signed)
Patient to ED by POV with c/o N/V/ABD pain. She states she took prescribed med and OTC meds to help with symptoms but had no relief. Reports she does not want Zofran or Phenergan because they do not work.

## 2023-09-18 NOTE — ED Provider Notes (Signed)
[place] EMERGENCY DEPARTMENT AT [facility] Provider Note   CSN: [id number] Arrival date & time: 09/18/23  1516     History {Add pertinent medical, surgical, social history, OB history to HPI:1} Chief Complaint  Patient presents with   Abdominal Pain   Nausea    Vomiting    Marie Myers is a 33 y.o. female.  HPI     Home Medications Prior to Admission medications   Medication Sig Start Date End Date Taking? Authorizing Provider  acetaminophen (TYLENOL) 325 MG tablet Take 2 tablets (650 mg total) by mouth every 6 (six) hours as needed for mild pain (pain score 1-3) or fever. 06/08/23   Standley Brooking, MD  ARIPiprazole (ABILIFY) 5 MG tablet Take 1 tablet (5 mg total) by mouth daily. 06/01/23   Shanna Cisco, NP  cholecalciferol (CHOLECALCIFEROL) 25 MCG tablet Take 1 tablet (1,000 Units total) by mouth daily. 07/01/23   Rai, Delene Ruffini, MD  folic acid (FOLVITE) 1 MG tablet Take 1 tablet (1 mg total) by mouth daily. 07/01/23   Rai, Delene Ruffini, MD  gabapentin (NEURONTIN) 300 MG capsule Take 1 capsule (300 mg total) by mouth 3 (three) times daily. 09/07/23 09/01/24  Windell Norfolk, MD  Oxcarbazepine (TRILEPTAL) 300 MG tablet Take 1 tablet (300 mg total) by mouth 2 (two) times daily. 09/07/23 09/01/24  Windell Norfolk, MD  oxyCODONE (OXY IR/ROXICODONE) 5 MG immediate release tablet Take 1 tablet (5 mg total) by mouth every 6 (six) hours as needed for moderate pain (pain score 4-6) or severe pain (pain score 7-10). Patient not taking: Reported on 09/07/2023 06/30/23   Rai, Delene Ruffini, MD  pantoprazole (PROTONIX) 40 MG tablet Take 1 tablet (40 mg total) by mouth 2 (two) times daily before a meal. 06/30/23   Rai, Ripudeep K, MD  promethazine (PHENERGAN) 25 MG tablet Take 1 tablet (25 mg total) by mouth every 6 (six) hours as needed for nausea or vomiting. 06/30/23   Rai, Delene Ruffini, MD  sertraline (ZOLOFT) 100 MG tablet Take 100 mg by mouth in the morning and at bedtime. 08/25/22    [provider]  sucralfate (CARAFATE) 1 GM/10ML suspension Take 10 mLs (1 g total) by mouth 4 (four) times daily -  with meals and at bedtime. Can substitute to tablets if solution is not available. 06/30/23 07/30/23  Rai, Delene Ruffini, MD  traZODone (DESYREL) 100 MG tablet Take 2 tablets (200 mg total) by mouth at bedtime. 06/01/23   Shanna Cisco, NP      Allergies    Zithromax [azithromycin]    Review of Systems   Review of Systems  Physical Exam Updated Vital Signs BP (!) 158/103   Pulse 80   Temp 99.3 F (37.4 C) (Oral)   Resp 14   Ht 5\' 6"  (1.676 m)   Wt 79.4 kg   LMP 08/18/2023   SpO2 100%   BMI 28.25 kg/m  Physical Exam  ED Results / Procedures / Treatments   Labs (all labs ordered are listed, but only abnormal results are displayed) Labs Reviewed  COMPREHENSIVE METABOLIC PANEL - Abnormal; Notable for the following components:      Result Value   Potassium 5.6 (*)    CO2 17 (*)    Glucose, Bld 113 (*)    BUN 5 (*)    Creatinine, Ser 0.42 (*)    Calcium 8.7 (*)    Total Protein 8.2 (*)    AST 150 (*)  ALT 84 (*)    Anion gap 18 (*)    All other components within normal limits  URINALYSIS, ROUTINE W REFLEX MICROSCOPIC - Abnormal; Notable for the following components:   Ketones, ur 5 (*)    All other components within normal limits  CBC - Abnormal; Notable for the following components:   RBC 3.43 (*)    MCV 107.9 (*)    MCH 36.4 (*)    RDW 17.3 (*)    All other components within normal limits  BASIC METABOLIC PANEL - Abnormal; Notable for the following components:   CO2 21 (*)    Glucose, Bld 127 (*)    BUN <5 (*)    All other components within normal limits  BETA-HYDROXYBUTYRIC ACID - Abnormal; Notable for the following components:   Beta-Hydroxybutyric Acid 1.32 (*)    All other components within normal limits  BLOOD GAS, VENOUS - Abnormal; Notable for the following components:   pCO2, Ven 39 (*)    All other components within normal  limits  I-STAT CG4 LACTIC ACID, ED - Abnormal; Notable for the following components:   Lactic Acid, Venous 2.3 (*)    All other components within normal limits  LIPASE, BLOOD  HCG, SERUM, QUALITATIVE  SALICYLATE LEVEL  I-STAT CG4 LACTIC ACID, ED    EKG None  Radiology CT ABDOMEN PELVIS W CONTRAST Result Date: 09/18/2023 CLINICAL DATA:  Abdominal pain, acute, nonlocalized. EXAM: CT ABDOMEN AND PELVIS WITH CONTRAST TECHNIQUE: Multidetector CT imaging of the abdomen and pelvis was performed using the standard protocol following bolus administration of intravenous contrast. RADIATION DOSE REDUCTION: This exam was performed according to the departmental dose-optimization program which includes automated exposure control, adjustment of the mA and/or kV according to patient size and/or use of iterative reconstruction technique. CONTRAST:  OMNIPAQUE IOHEXOL 300 MG/ML  SOLN COMPARISON:  CTs with IV contrast 06/26/2023, 06/03/2023. FINDINGS: Lower chest: There are linear scar-like opacities in the right middle lobe base. There is mild bronchial thickening in the lower lobes. No active infiltrate is seen. There is a small hiatal hernia, postsurgical change again at the GE junction. The cardiac size is normal. Hepatobiliary: The liver moderately steatotic, increasingly so since 06/26/2023, measuring 21 cm in length. There is no mass enhancement. Status post cholecystectomy with stable mild prominence of the common bile duct. Pancreas: No abnormality. Spleen: No abnormality.  No splenomegaly. Adrenals/Urinary Tract: Adrenal glands are unremarkable. Kidneys are normal, without renal calculi, focal lesion, or hydronephrosis. Bladder is unremarkable. 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 There is a stool distended small bowel segment in the left hemiabdomen not seen previously. There is no bowel dilatation associated with this, but there are mildly dilated subcecal distal ileal segments in the right lower abdomen  up to 3 cm, which could be due to ileus or low-grade partial SBO. A discrete transitional segment was not seen. The terminal ileum is normal in caliber. The large intestine is mostly contracted. There are no inflammatory changes. Vascular/Lymphatic: No significant vascular findings are present. No enlarged abdominal or pelvic lymph nodes. Reproductive: Uterus and bilateral adnexa are unremarkable. Other: No abdominal wall hernia or abnormality. No abdominopelvic ascites. Musculoskeletal: There is an old left hip pinning with advanced for age bilateral hip DJD with bilateral short femoral necks. No acute or other significant osseous findings are seen. IMPRESSION: 1. Mildly dilated subcecal distal ileal segments in the right lower abdomen up to 3 cm, which could be due to ileus or low-grade partial SBO. A discrete transitional  segment was not seen. 2. Stool distended small bowel segment in the left hemiabdomen not seen previously. 3. Moderate hepatic steatosis, increasingly so since 06/26/2023. 4. Small hiatal hernia. 5. 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 6. 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 Old left hip pinning. Electronically Signed   By: Almira Bar M.D.   On: 09/18/2023 22:36    Procedures Procedures  {Document cardiac monitor, telemetry assessment procedure when appropriate:1}  Medications Ordered in ED Medications  ondansetron (ZOFRAN) injection 4 mg (has no administration in time range)  LORazepam (ATIVAN) injection 1 mg (1 mg Intravenous Given 09/18/23 2050)  lactated ringers bolus 1,000 mL (0 mLs Intravenous Stopped 09/18/23 2311)  iohexol (OMNIPAQUE) 300 MG/ML solution 100 mL (100 mLs Intravenous Contrast Given 09/18/23 2159)  HYDROmorphone (DILAUDID) injection 1 mg (1 mg Intravenous Given 09/18/23 2341)    ED Course/ Medical Decision Making/ A&P   {   Click here for ABCD2, HEART and other calculatorsREFRESH Note before signing :1}                              Medical  Decision Making Amount and/or Complexity of Data Reviewed Labs: ordered.  Risk Prescription drug management.   ***  {Document critical care time when appropriate:1} {Document review of labs and clinical decision tools ie heart score, Chads2Vasc2 etc:1}  {Document your independent review of radiology images, and any outside records:1} {Document your discussion with family members, caretakers, and with consultants:1} {Document social determinants of health affecting pt's care:1} {Document your decision making why or why not admission, treatments were needed:1} Final Clinical Impression(s) / ED Diagnoses Final diagnoses:  None    Rx / DC Orders ED Discharge Orders     None

## 2023-09-19 ENCOUNTER — Observation Stay (HOSPITAL_COMMUNITY): Payer: Medicaid Other

## 2023-09-19 ENCOUNTER — Encounter (HOSPITAL_COMMUNITY): Payer: Self-pay | Admitting: Family Medicine

## 2023-09-19 DIAGNOSIS — I10 Essential (primary) hypertension: Secondary | ICD-10-CM | POA: Diagnosis present

## 2023-09-19 DIAGNOSIS — K760 Fatty (change of) liver, not elsewhere classified: Secondary | ICD-10-CM | POA: Diagnosis present

## 2023-09-19 DIAGNOSIS — F1020 Alcohol dependence, uncomplicated: Secondary | ICD-10-CM | POA: Diagnosis present

## 2023-09-19 DIAGNOSIS — R824 Acetonuria: Secondary | ICD-10-CM | POA: Diagnosis present

## 2023-09-19 DIAGNOSIS — G4700 Insomnia, unspecified: Secondary | ICD-10-CM | POA: Diagnosis present

## 2023-09-19 DIAGNOSIS — R7401 Elevation of levels of liver transaminase levels: Secondary | ICD-10-CM | POA: Diagnosis present

## 2023-09-19 DIAGNOSIS — F419 Anxiety disorder, unspecified: Secondary | ICD-10-CM | POA: Diagnosis present

## 2023-09-19 DIAGNOSIS — Z833 Family history of diabetes mellitus: Secondary | ICD-10-CM | POA: Diagnosis not present

## 2023-09-19 DIAGNOSIS — R112 Nausea with vomiting, unspecified: Secondary | ICD-10-CM

## 2023-09-19 DIAGNOSIS — E860 Dehydration: Secondary | ICD-10-CM | POA: Diagnosis present

## 2023-09-19 DIAGNOSIS — G8929 Other chronic pain: Secondary | ICD-10-CM | POA: Diagnosis present

## 2023-09-19 DIAGNOSIS — R1013 Epigastric pain: Secondary | ICD-10-CM | POA: Diagnosis present

## 2023-09-19 DIAGNOSIS — Z881 Allergy status to other antibiotic agents status: Secondary | ICD-10-CM | POA: Diagnosis not present

## 2023-09-19 DIAGNOSIS — F332 Major depressive disorder, recurrent severe without psychotic features: Secondary | ICD-10-CM | POA: Diagnosis present

## 2023-09-19 DIAGNOSIS — Z79899 Other long term (current) drug therapy: Secondary | ICD-10-CM | POA: Diagnosis not present

## 2023-09-19 DIAGNOSIS — Z888 Allergy status to other drugs, medicaments and biological substances status: Secondary | ICD-10-CM | POA: Diagnosis not present

## 2023-09-19 DIAGNOSIS — E8729 Other acidosis: Secondary | ICD-10-CM | POA: Diagnosis present

## 2023-09-19 DIAGNOSIS — Z9884 Bariatric surgery status: Secondary | ICD-10-CM | POA: Diagnosis not present

## 2023-09-19 DIAGNOSIS — G40909 Epilepsy, unspecified, not intractable, without status epilepticus: Secondary | ICD-10-CM | POA: Diagnosis present

## 2023-09-19 LAB — CBC
HCT: 36.4 % (ref 36.0–46.0)
Hemoglobin: 12 g/dL (ref 12.0–15.0)
MCH: 36.7 pg — ABNORMAL HIGH (ref 26.0–34.0)
MCHC: 33 g/dL (ref 30.0–36.0)
MCV: 111.3 fL — ABNORMAL HIGH (ref 80.0–100.0)
Platelets: 175 10*3/uL (ref 150–400)
RBC: 3.27 MIL/uL — ABNORMAL LOW (ref 3.87–5.11)
RDW: 17.8 % — ABNORMAL HIGH (ref 11.5–15.5)
WBC: 8.9 10*3/uL (ref 4.0–10.5)
nRBC: 0 % (ref 0.0–0.2)

## 2023-09-19 LAB — COMPREHENSIVE METABOLIC PANEL
ALT: 68 U/L — ABNORMAL HIGH (ref 0–44)
AST: 89 U/L — ABNORMAL HIGH (ref 15–41)
Albumin: 4 g/dL (ref 3.5–5.0)
Alkaline Phosphatase: 67 U/L (ref 38–126)
Anion gap: 12 (ref 5–15)
BUN: 6 mg/dL (ref 6–20)
CO2: 24 mmol/L (ref 22–32)
Calcium: 8.8 mg/dL — ABNORMAL LOW (ref 8.9–10.3)
Chloride: 101 mmol/L (ref 98–111)
Creatinine, Ser: 0.53 mg/dL (ref 0.44–1.00)
GFR, Estimated: >60 mL/min (ref >60)
Glucose, Bld: 78 mg/dL (ref 70–99)
Potassium: 3.4 mmol/L — ABNORMAL LOW (ref 3.5–5.1)
Sodium: 137 mmol/L (ref 135–145)
Total Bilirubin: 0.9 mg/dL (ref 0.0–1.2)
Total Protein: 7.1 g/dL (ref 6.5–8.1)

## 2023-09-19 LAB — SALICYLATE LEVEL: Salicylate Lvl: <7 mg/dL — ABNORMAL LOW (ref 7.0–30.0)

## 2023-09-19 LAB — MAGNESIUM: Magnesium: 2.1 mg/dL (ref 1.7–2.4)

## 2023-09-19 LAB — HEPATITIS PANEL, ACUTE
HCV Ab: NONREACTIVE
Hep A IgM: NONREACTIVE
Hep B C IgM: NONREACTIVE
Hepatitis B Surface Ag: NONREACTIVE

## 2023-09-19 LAB — PROTIME-INR
INR: 1 (ref 0.8–1.2)
Prothrombin Time: 13.3 s (ref 11.4–15.2)

## 2023-09-19 MED ORDER — THIAMINE MONONITRATE 100 MG PO TABS
100.0000 mg | ORAL_TABLET | Freq: Every day | ORAL | Status: DC
Start: 2023-09-19 — End: 2023-09-21
  Filled 2023-09-19: qty 1

## 2023-09-19 MED ORDER — IOHEXOL 300 MG/ML  SOLN
100.0000 mL | Freq: Once | INTRAMUSCULAR | Status: AC | PRN
  Administered 2023-09-19: 100 mL via INTRAVENOUS

## 2023-09-19 MED ORDER — HYDROMORPHONE HCL 1 MG/ML IJ SOLN
0.5000 mg | INTRAMUSCULAR | Status: DC | PRN
  Administered 2023-09-19 (×2): 0.5 mg via INTRAVENOUS
  Filled 2023-09-19 (×2): qty 0.5

## 2023-09-19 MED ORDER — ACETAMINOPHEN 325 MG PO TABS
650.0000 mg | ORAL_TABLET | Freq: Four times a day (QID) | ORAL | Status: DC | PRN
Start: 2023-09-19 — End: 2023-09-21

## 2023-09-19 MED ORDER — PANTOPRAZOLE SODIUM 40 MG IV SOLR
40.0000 mg | INTRAVENOUS | Status: DC
  Administered 2023-09-19 – 2023-09-21 (×3): 40 mg via INTRAVENOUS
  Filled 2023-09-19 (×3): qty 10

## 2023-09-19 MED ORDER — PROCHLORPERAZINE EDISYLATE 10 MG/2ML IJ SOLN
5.0000 mg | INTRAMUSCULAR | Status: DC | PRN
  Administered 2023-09-19 – 2023-09-21 (×3): 5 mg via INTRAVENOUS
  Filled 2023-09-19 (×3): qty 2

## 2023-09-19 MED ORDER — OXYCODONE HCL 5 MG PO TABS
5.0000 mg | ORAL_TABLET | ORAL | Status: DC | PRN
  Administered 2023-09-20 (×3): 5 mg via ORAL
  Filled 2023-09-19 (×3): qty 1

## 2023-09-19 MED ORDER — ONDANSETRON HCL 4 MG/2ML IJ SOLN
4.0000 mg | Freq: Four times a day (QID) | INTRAMUSCULAR | Status: DC | PRN
  Administered 2023-09-19 – 2023-09-21 (×6): 4 mg via INTRAVENOUS
  Filled 2023-09-19 (×6): qty 2

## 2023-09-19 MED ORDER — FOLIC ACID 1 MG PO TABS
1.0000 mg | ORAL_TABLET | Freq: Every day | ORAL | Status: DC
Start: 2023-09-19 — End: 2023-09-21
  Filled 2023-09-19: qty 1

## 2023-09-19 MED ORDER — GABAPENTIN 300 MG PO CAPS
300.0000 mg | ORAL_CAPSULE | Freq: Three times a day (TID) | ORAL | Status: DC
  Administered 2023-09-19 – 2023-09-20 (×4): 300 mg via ORAL
  Filled 2023-09-19 (×4): qty 1

## 2023-09-19 MED ORDER — SODIUM CHLORIDE 0.9 % IV SOLN: INTRAVENOUS | Status: AC

## 2023-09-19 MED ORDER — ACETAMINOPHEN 650 MG RE SUPP: 650.0000 mg | Freq: Four times a day (QID) | RECTAL | Status: DC | PRN

## 2023-09-19 MED ORDER — BARIUM SULFATE 0.1 % PO SUSP
1350.0000 mL | Freq: Once | ORAL | Status: AC
  Administered 2023-09-19: 1350 mL via ORAL

## 2023-09-19 MED ORDER — ADULT MULTIVITAMIN W/MINERALS CH
1.0000 | ORAL_TABLET | Freq: Every day | ORAL | Status: DC
  Filled 2023-09-19: qty 1

## 2023-09-19 MED ORDER — ONDANSETRON HCL 4 MG PO TABS: 4.0000 mg | ORAL_TABLET | Freq: Four times a day (QID) | ORAL | Status: DC | PRN

## 2023-09-19 MED ORDER — ARIPIPRAZOLE 10 MG PO TABS
5.0000 mg | ORAL_TABLET | Freq: Every day | ORAL | Status: DC
  Filled 2023-09-19: qty 1

## 2023-09-19 MED ORDER — ENOXAPARIN SODIUM 40 MG/0.4ML IJ SOSY
40.0000 mg | PREFILLED_SYRINGE | INTRAMUSCULAR | Status: DC
Start: 2023-09-19 — End: 2023-09-21
  Administered 2023-09-19 – 2023-09-20 (×2): 40 mg via SUBCUTANEOUS
  Filled 2023-09-19 (×2): qty 0.4

## 2023-09-19 MED ORDER — LORAZEPAM 2 MG/ML IJ SOLN: 0.0000 mg | Freq: Two times a day (BID) | INTRAMUSCULAR | Status: DC

## 2023-09-19 MED ORDER — LORAZEPAM 1 MG PO TABS
1.0000 mg | ORAL_TABLET | ORAL | Status: DC | PRN
Start: 2023-09-19 — End: 2023-09-22

## 2023-09-19 MED ORDER — LORAZEPAM 2 MG/ML IJ SOLN
1.0000 mg | INTRAMUSCULAR | Status: DC | PRN
  Filled 2023-09-19: qty 1

## 2023-09-19 MED ORDER — LORAZEPAM 2 MG/ML IJ SOLN
0.0000 mg | Freq: Four times a day (QID) | INTRAMUSCULAR | Status: AC
  Administered 2023-09-19: 1 mg via INTRAVENOUS
  Administered 2023-09-19 (×2): 2 mg via INTRAVENOUS
  Filled 2023-09-19 (×2): qty 1

## 2023-09-19 MED ORDER — OXCARBAZEPINE 300 MG PO TABS
300.0000 mg | ORAL_TABLET | Freq: Two times a day (BID) | ORAL | Status: DC
Start: 2023-09-19 — End: 2023-09-21
  Administered 2023-09-19 – 2023-09-20 (×3): 300 mg via ORAL
  Filled 2023-09-19 (×5): qty 1

## 2023-09-19 MED ORDER — TRAZODONE HCL 100 MG PO TABS
200.0000 mg | ORAL_TABLET | Freq: Every day | ORAL | Status: DC
  Administered 2023-09-19 – 2023-09-20 (×2): 200 mg via ORAL
  Filled 2023-09-19 (×2): qty 2

## 2023-09-19 MED ORDER — THIAMINE HCL 100 MG/ML IJ SOLN
100.0000 mg | Freq: Every day | INTRAMUSCULAR | Status: DC
Start: 2023-09-19 — End: 2023-09-21
  Administered 2023-09-19: 100 mg via INTRAVENOUS
  Filled 2023-09-19 (×2): qty 2

## 2023-09-19 NOTE — Consult Note (Signed)
Reason for Consult: Abdominal pain Referring Physician: Natale Milch MD  Marie Myers is an 33 y.o. female.  HPI: 33 year old female with chronic abdominal pain.  She underwent gastric bypass in [place] over a decade ago.  She has been seen on and off in the hospital for abdominal pain and/or recently pneumonia.  She gets these episodes of lower abdominal pain with nausea and vomiting.  CT scan obtained last night showed mild dilation of her terminal ileum with fecalization of this.  Her last bowel movement was over a week ago.  This is not uncommon for her.  She does use alcohol and vapes occasionally.  Patient went laparoscopy in 2020 for the symptoms and no specific abnormality could be found.  She has not followed up with the bariatric surgeon but was trying to do that.  Unclear if she has had any meaningful workup of her anatomy.  The pain is described as dull and aching in her lower abdomen.  She also has nausea vomiting and retching.  This is not new for her.-Upper endoscopy 2024 was read as normal.  Past Medical History:  Diagnosis Date   Anxiety    Depression    H/O gastric bypass    Insomnia    Marijuana use 01/08/2021   Moderate alcohol use disorder (HCC) 01/08/2021    Past Surgical History:  Procedure Laterality Date   CHOLECYSTECTOMY     ESOPHAGOGASTRODUODENOSCOPY (EGD) WITH PROPOFOL N/A 06/29/2023   Procedure: ESOPHAGOGASTRODUODENOSCOPY (EGD) WITH PROPOFOL;  Surgeon: Vida Rigger, MD;  Location: [facility];  Service: Gastroenterology;  Laterality: N/A;  Possible balloon dilation   GASTRIC BYPASS     GASTRIC BYPASS     LAPAROSCOPY N/A 08/11/2019   Procedure: LAPAROSCOPY DIAGNOSTIC;  Surgeon: Harriette Bouillon, MD;  Location: [facility] OR;  Service: General;  Laterality: N/A;    Family History  Problem Relation Age of Onset   Diabetes Mother    Diabetes Father     Social History:  reports that she has never smoked. She has never used smokeless tobacco. She reports current alcohol  use of about 1.0 standard drink of alcohol per week. She reports current drug use. Drug: Marijuana.  Allergies:  Allergies  Allergen Reactions   Zithromax [Azithromycin] Hives    Medications: I have reviewed the patient's current medications.  Results for orders placed or performed during the hospital encounter of 09/18/23 (from the past 48 hours)  Lipase, blood     Status: None   Collection Time: 09/18/23  3:52 PM  Result Value Ref Range   Lipase 25 11 - 51 U/L    Comment: Performed at [facility], [address], [place], [place] [address]  Comprehensive metabolic panel     Status: Abnormal   Collection Time: 09/18/23  3:52 PM  Result Value Ref Range   Sodium 137 135 - 145 mmol/L   Potassium 5.6 (H) 3.5 - 5.1 mmol/L    Comment: HEMOLYSIS AT THIS LEVEL MAY AFFECT RESULT   Chloride 102 98 - 111 mmol/L   CO2 17 (L) 22 - 32 mmol/L   Glucose, Bld 113 (H) 70 - 99 mg/dL    Comment: Glucose reference range applies only to samples taken after fasting for at least 8 hours.   BUN 5 (L) 6 - 20 mg/dL   Creatinine, Ser 6.04 (L) 0.44 - 1.00 mg/dL   Calcium 8.7 (L) 8.9 - 10.3 mg/dL   Total Protein 8.2 (H) 6.5 - 8.1 g/dL   Albumin 4.3 3.5 -  5.0 g/dL   AST 161 (H) 15 - 41 U/L    Comment: HEMOLYSIS AT THIS LEVEL MAY AFFECT RESULT   ALT 84 (H) 0 - 44 U/L    Comment: HEMOLYSIS AT THIS LEVEL MAY AFFECT RESULT   Alkaline Phosphatase 83 38 - 126 U/L   Total Bilirubin 0.5 0.0 - 1.2 mg/dL    Comment: HEMOLYSIS AT THIS LEVEL MAY AFFECT RESULT   GFR, Estimated >60 >60 mL/min    Comment: (NOTE) Calculated using the CKD-EPI Creatinine Equation (2021)    Anion gap 18 (H) 5 - 15    Comment: Performed at [facility], [address], [place], [place] [address]  Urinalysis, Routine w reflex microscopic -Urine, Clean Catch     Status: Abnormal   Collection Time: 09/18/23  3:52 PM  Result Value Ref Range   Color, Urine YELLOW YELLOW   APPearance CLEAR CLEAR    Specific Gravity, Urine 1.013 1.005 - 1.030   pH 6.0 5.0 - 8.0   Glucose, UA NEGATIVE NEGATIVE mg/dL   Hgb urine dipstick NEGATIVE NEGATIVE   Bilirubin Urine NEGATIVE NEGATIVE   Ketones, ur 5 (A) NEGATIVE mg/dL   Protein, ur NEGATIVE NEGATIVE mg/dL   Nitrite NEGATIVE NEGATIVE   Leukocytes,Ua NEGATIVE NEGATIVE    Comment: Performed at [facility], [address] [place], [place] [address]  hCG, serum, qualitative     Status: None   Collection Time: 09/18/23  3:52 PM  Result Value Ref Range   Preg, Serum NEGATIVE NEGATIVE    Comment:        THE SENSITIVITY OF THIS METHODOLOGY IS >10 mIU/mL. Performed at [facility], [address] [place], [place] [address]   Basic metabolic panel     Status: Abnormal   Collection Time: 09/18/23  7:55 PM  Result Value Ref Range   Sodium 139 135 - 145 mmol/L   Potassium 3.8 3.5 - 5.1 mmol/L   Chloride 103 98 - 111 mmol/L   CO2 21 (L) 22 - 32 mmol/L   Glucose, Bld 127 (H) 70 - 99 mg/dL    Comment: Glucose reference range applies only to samples taken after fasting for at least 8 hours.   BUN <5 (L) 6 - 20 mg/dL   Creatinine, Ser 7.82 0.44 - 1.00 mg/dL   Calcium 8.9 8.9 - 95.6 mg/dL   GFR, Estimated >21 >30 mL/min    Comment: (NOTE) Calculated using the CKD-EPI Creatinine Equation (2021)    Anion gap 15 5 - 15    Comment: Performed at [facility], [address] [place], [place] [address]  I-Stat CG4 Lactic Acid     Status: Abnormal   Collection Time: 09/18/23  8:51 PM  Result Value Ref Range   Lactic Acid, Venous 2.3 (HH) 0.5 - 1.9 mmol/L   Comment NOTIFIED PHYSICIAN   Blood gas, venous (at [facility])     Status: Abnormal   Collection Time: 09/18/23  8:56 PM  Result Value Ref Range   pH, Ven 7.42 7.25 - 7.43   pCO2, Ven 39 (L) 44 - 60 mmHg   pO2, Ven 44 32 - 45 mmHg   Bicarbonate 25.3 20.0 - 28.0 mmol/L   Acid-Base Excess 0.8 0.0 - 2.0 mmol/L   O2 Saturation 72.8 %   Patient  temperature 37.0     Comment: Performed at [facility], [address] [place], [place] [address]  CBC  Status: Abnormal   Collection Time: 09/18/23  8:57 PM  Result Value Ref Range   WBC 10.0 4.0 - 10.5 K/uL   RBC 3.43 (L) 3.87 - 5.11 MIL/uL   Hemoglobin 12.5 12.0 - 15.0 g/dL   HCT 16.1 09.6 - 04.5 %   MCV 107.9 (H) 80.0 - 100.0 fL   MCH 36.4 (H) 26.0 - 34.0 pg   MCHC 33.8 30.0 - 36.0 g/dL   RDW 40.9 (H) 81.1 - 91.4 %   Platelets 208 150 - 400 K/uL   nRBC 0.0 0.0 - 0.2 %    Comment: Performed at [facility], [address], [place], [place] [address]  Beta-hydroxybutyric acid     Status: Abnormal   Collection Time: 09/18/23  9:18 PM  Result Value Ref Range   Beta-Hydroxybutyric Acid 1.32 (H) 0.05 - 0.27 mmol/L    Comment: Performed at [facility], [address] [place], [place] [address]  I-Stat CG4 Lactic Acid     Status: None   Collection Time: 09/18/23 10:29 PM  Result Value Ref Range   Lactic Acid, Venous 1.4 0.5 - 1.9 mmol/L    CT ABDOMEN PELVIS W CONTRAST Result Date: 09/18/2023 CLINICAL DATA:  Abdominal pain, acute, nonlocalized. EXAM: CT ABDOMEN AND PELVIS WITH CONTRAST TECHNIQUE: Multidetector CT imaging of the abdomen and pelvis was performed using the standard protocol following bolus administration of intravenous contrast. RADIATION DOSE REDUCTION: This exam was performed according to the departmental dose-optimization program which includes automated exposure control, adjustment of the mA and/or kV according to patient size and/or use of iterative reconstruction technique. CONTRAST:  OMNIPAQUE IOHEXOL 300 MG/ML  SOLN COMPARISON:  CTs with IV contrast 06/26/2023, 06/03/2023. FINDINGS: Lower chest: There are linear scar-like opacities in the right middle lobe base. There is mild bronchial thickening in the lower lobes. No active infiltrate is seen. There is a small hiatal hernia, postsurgical change again  at the GE junction. The cardiac size is normal. Hepatobiliary: The liver moderately steatotic, increasingly so since 06/26/2023, measuring 21 cm in length. There is no mass enhancement. Status post cholecystectomy with stable mild prominence of the common bile duct. Pancreas: No abnormality. Spleen: No abnormality.  No splenomegaly. Adrenals/Urinary Tract: Adrenal glands are unremarkable. Kidneys are normal, without renal calculi, focal lesion, or hydronephrosis. Bladder is unremarkable. 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 There is a stool distended small bowel segment in the left hemiabdomen not seen previously. There is no bowel dilatation associated with this, but there are mildly dilated subcecal distal ileal segments in the right lower abdomen up to 3 cm, which could be due to ileus or low-grade partial SBO. A discrete transitional segment was not seen. The terminal ileum is normal in caliber. The large intestine is mostly contracted. There are no inflammatory changes. Vascular/Lymphatic: No significant vascular findings are present. No enlarged abdominal or pelvic lymph nodes. Reproductive: Uterus and bilateral adnexa are unremarkable. Other: No abdominal wall hernia or abnormality. No abdominopelvic ascites. Musculoskeletal: There is an old left hip pinning with advanced for age bilateral hip DJD with bilateral short femoral necks. No acute or other significant osseous findings are seen. IMPRESSION: 1. Mildly dilated subcecal distal ileal segments in the right lower abdomen up to 3 cm, which could be due to ileus or low-grade partial SBO. A discrete transitional segment was not seen. 2. Stool distended small bowel segment in the left hemiabdomen not seen previously. 3. Moderate hepatic steatosis, increasingly so since 06/26/2023. 4. Small hiatal hernia.  5. 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 6. 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 Old left hip pinning. Electronically Signed   By: Almira Bar M.D.    On: 09/18/2023 22:36    Review of Systems  Constitutional:  Negative for activity change.  Respiratory: Negative.    Cardiovascular: Negative.   Gastrointestinal:  Positive for abdominal pain, constipation, nausea and vomiting.  Neurological: Negative.   Hematological: Negative.   Psychiatric/Behavioral: Negative.     Blood pressure 114/70, pulse 88, temperature 97.7 F (36.5 C), temperature source Oral, resp. rate 17, height 5\' 6"  (1.676 m), weight 79.4 kg, last menstrual period 08/18/2023, SpO2 98%. Physical Exam Vitals reviewed.  Cardiovascular:     Rate and Rhythm: Normal rate.  Pulmonary:     Effort: Pulmonary effort is normal.  Abdominal:     General: Abdomen is flat. A surgical scar is present.     Palpations: Abdomen is soft.     Tenderness: There is abdominal tenderness in the right lower quadrant and left lower quadrant. There is no guarding or rebound.     Hernia: No hernia is present.  Neurological:     Mental Status: She is alert.     Assessment/Plan: Status post gastric bypass 2017 with multiple admissions for abdominal pain.  She has had workup which included laparoscopy 2020 and endoscopy in 2024 that were r unremarkable.  Recommend CT enterography since etiology is unclear of why she has these bouts of abdominal pain.  On her CT scan she does have dilation of her terminal ileum and is fecalization which could be secondary to motility issue and her chronic constipation.  There is no signs of an internal hernia.  May need bariatric surgery input on this patient.  She does have a history of a hiatal hernia but is not really complaining of those types of symptoms today.  Okay to allow ice chips.  Would forego NG tube since there is no evidence of small bowel dilation except for minimal dilation of her terminal ileum.  Clovis Pu Noris Kulinski MD 09/19/2023, 7:26 AM   Moderate complexity

## 2023-09-19 NOTE — Progress Notes (Signed)
Patient got anxious and pulled her NG tube, refusing to have it reinserted. On call provider aware.

## 2023-09-19 NOTE — Progress Notes (Signed)
PROGRESS NOTE    Marie Myers  ONG:[id number] DOB: 1991-07-03 DOA: 09/18/2023 PCP: Delfino Lovett, FNP   Brief Narrative:  Marie Myers is a 33 y.o. 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   Assessment & Plan:   Principal Problem:   Intractable nausea and vomiting Active Problems:   Seizure disorder (HCC)   Metabolic acidemia   Alcohol use disorder, severe, dependence (HCC)   MDD (major depressive disorder), recurrent episode, severe (HCC)   Anxiety   Elevated transaminase level  Intractable N/V Rule out partial SBO -Noted on CT -Surgery following - continue NPO/Bowel rest - CT+ contrast today -NG to be placed for patient comfort (profound nausea/vomiting uncontrolled by meds) patient requested to assist with PO contrast administration as well. -Continue supportive care  Metabolic acidosis with elevated AG secondary to lactic acidosis, resolved - Resolved with IV fluids   Hx of Etoh withdrawal and non vs epileptic seizures  - Continue oxcarbazepine    Depression, anxiety, insomnia - Continue Abilify, gabapentin, trazodone     Alcohol abuse  - Monitor with CIWA, use Ativan as-needed, supplement vitamins     Elevated transaminases  - Intermittently elevated in past  - CT demonstrates hepatic steatosis that is increased from November 2024  -Hepatitis panel negative, salicylate level negative, lipase negative; no Etoh level - likely not of use at this point of her hospitalization  DVT prophylaxis: enoxaparin (LOVENOX) injection 40 mg Start: 09/19/23 1000   Code Status:   Code Status: Full Code  Family Communication: None present  Status is: Inpatient  Dispo: The patient is from: Home              Anticipated d/c is to: Home              Anticipated d/c date is: 24 to 48  hours              Patient currently not medically stable for discharge  Consultants:  General surgery  Procedures:  None  Antimicrobials:  None  Subjective: No acute issues or events overnight, nausea vomiting somewhat poorly controlled today, requesting NG tube placement for relief.  Otherwise denies headache fevers chills shortness of breath or chest pain  Objective: Vitals:   09/19/23 0308 09/19/23 0400 09/19/23 0430 09/19/23 0600  BP:  105/62 113/64 114/70  Pulse:  83 84 88  Resp:   17   Temp: 97.7 F (36.5 C)     TempSrc: Oral     SpO2:   98%   Weight:      Height:       No intake or output data in the 24 hours ending 09/19/23 0728 Filed Weights   09/18/23 1529  Weight: 79.4 kg    Examination:  General:  Pleasantly resting in bed, No acute distress. HEENT:  Normocephalic atraumatic.  Sclerae nonicteric, noninjected.  Extraocular movements intact bilaterally. Neck:  Without mass or deformity.  Trachea is midline. Lungs:  Clear to auscultate bilaterally without rhonchi, wheeze, or rales. Heart:  Regular rate and rhythm.  Without murmurs, rubs, or gallops. Abdomen:  Soft, minimally tender without rebound or guarding Extremities: Without cyanosis, clubbing, edema, or obvious deformity. Skin:  Warm and dry, no erythema.   Data Reviewed: I have personally reviewed following labs and imaging studies  CBC: Recent Labs  Lab 09/18/23 2057  WBC 10.0  HGB 12.5  HCT 37.0  MCV 107.9*  PLT 208   Basic Metabolic Panel: Recent Labs  Lab 09/18/23 1552 09/18/23 1955  NA 137 139  K 5.6* 3.8  CL 102 103  CO2 17* 21*  GLUCOSE 113* 127*  BUN 5* <5*  CREATININE 0.42* 0.47  CALCIUM 8.7* 8.9   GFR: Estimated Creatinine Clearance: 107.3 mL/min (by C-G formula based on SCr of 0.47 mg/dL). Liver Function Tests: Recent Labs  Lab 09/18/23 1552  AST 150*  ALT 84*  ALKPHOS 83  BILITOT 0.5  PROT 8.2*  ALBUMIN 4.3   Recent Labs  Lab 09/18/23 1552  LIPASE 25    No results for input(s): "AMMONIA" in the last 168 hours. Coagulation Profile: No results for input(s): "INR", "PROTIME" in the last 168 hours. Cardiac Enzymes: No results for input(s): "CKTOTAL", "CKMB", "CKMBINDEX", "TROPONINI" in the last 168 hours. BNP (last 3 results) No results for input(s): "PROBNP" in the last 8760 hours. HbA1C: No results for input(s): "HGBA1C" in the last 72 hours. CBG: No results for input(s): "GLUCAP" in the last 168 hours. Lipid Profile: No results for input(s): "CHOL", "HDL", "LDLCALC", "TRIG", "CHOLHDL", "LDLDIRECT" in the last 72 hours. Thyroid Function Tests: No results for input(s): "TSH", "T4TOTAL", "FREET4", "T3FREE", "THYROIDAB" in the last 72 hours. Anemia Panel: No results for input(s): "VITAMINB12", "FOLATE", "FERRITIN", "TIBC", "IRON", "RETICCTPCT" in the last 72 hours. Sepsis Labs: Recent Labs  Lab 09/18/23 2051 09/18/23 2229  LATICACIDVEN 2.3* 1.4    No results found for this or any previous visit (from the past 240 hours).       Radiology Studies: CT ABDOMEN PELVIS W CONTRAST Result Date: 09/18/2023 CLINICAL DATA:  Abdominal pain, acute, nonlocalized. EXAM: CT ABDOMEN AND PELVIS WITH CONTRAST TECHNIQUE: Multidetector CT imaging of the abdomen and pelvis was performed using the standard protocol following bolus administration of intravenous contrast. RADIATION DOSE REDUCTION: This exam was performed according to the departmental dose-optimization program which includes automated exposure control, adjustment of the mA and/or kV according to patient size and/or use of iterative reconstruction technique. CONTRAST:  OMNIPAQUE IOHEXOL 300 MG/ML  SOLN COMPARISON:  CTs with IV contrast 06/26/2023, 06/03/2023. FINDINGS: Lower chest: There are linear scar-like opacities in the right middle lobe base. There is mild bronchial thickening in the lower lobes. No active infiltrate is seen. There is a small hiatal hernia, postsurgical change  again at the GE junction. The cardiac size is normal. Hepatobiliary: The liver moderately steatotic, increasingly so since 06/26/2023, measuring 21 cm in length. There is no mass enhancement. Status post cholecystectomy with stable mild prominence of the common bile duct. Pancreas: No abnormality. Spleen: No abnormality.  No splenomegaly. Adrenals/Urinary Tract: Adrenal glands are unremarkable. Kidneys are normal, without renal calculi, focal lesion, or hydronephrosis. Bladder is unremarkable. 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 There is a stool distended small bowel segment in the left hemiabdomen not seen previously. There is no bowel dilatation associated with this, but there are mildly dilated subcecal distal ileal segments in the right lower abdomen up to 3 cm, which could be due to ileus or low-grade partial SBO. A discrete transitional segment was not seen. The terminal ileum is normal in caliber. The large intestine is mostly contracted. There are no inflammatory changes. Vascular/Lymphatic: No significant vascular findings are present. No enlarged abdominal or pelvic lymph nodes. Reproductive: Uterus and bilateral adnexa are unremarkable. Other: No abdominal wall hernia or abnormality. No abdominopelvic ascites. Musculoskeletal: There is an old left hip pinning with advanced for age bilateral hip  DJD with bilateral short femoral necks. No acute or other significant osseous findings are seen. IMPRESSION: 1. Mildly dilated subcecal distal ileal segments in the right lower abdomen up to 3 cm, which could be due to ileus or low-grade partial SBO. A discrete transitional segment was not seen. 2. Stool distended small bowel segment in the left hemiabdomen not seen previously. 3. Moderate hepatic steatosis, increasingly so since 06/26/2023. 4. Small hiatal hernia. 5. 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 6. 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 Old left hip pinning. Electronically Signed   By: Almira Bar  M.D.   On: 09/18/2023 22:36        Scheduled Meds:  ARIPiprazole  5 mg Oral Daily   enoxaparin (LOVENOX) injection  40 mg Subcutaneous Q24H   folic acid  1 mg Oral Daily   gabapentin  300 mg Oral TID   LORazepam  0-4 mg Intravenous Q6H   Followed by   Melene Muller ON 09/21/2023] LORazepam  0-4 mg Intravenous Q12H   multivitamin with minerals  1 tablet Oral Daily   Oxcarbazepine  300 mg Oral BID   pantoprazole (PROTONIX) IV  40 mg Intravenous Q24H   thiamine  100 mg Oral Daily   Or   thiamine  100 mg Intravenous Daily   traZODone  200 mg Oral QHS   Continuous Infusions:  sodium chloride 75 mL/hr at 09/19/23 0152     LOS: 0 days   Time spent:  Azucena Fallen, DO [facility]  If 7PM-7AM, please contact night-coverage [email or web address]  09/19/2023, 7:28 AM

## 2023-09-19 NOTE — H&P (Addendum)
History and Physical    Marie Myers UJW:[id number] DOB: 04/10/1991 DOA: 09/18/2023  PCP: Delfino Lovett, FNP   Patient coming from: Home   Chief Complaint: Abdominal pain, N/V   HPI: Marie Myers is a 33 y.o. 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  Patient reports that she developed abdominal pain yesterday and then nausea with recurrent bouts of nonbloody vomiting at 3 AM this morning.  She states that the pain involves the entire abdomen but is more severe in the lower quadrants.  She had 1 loose stool around the time of symptom onset but has not moved her bowels since then.  She continues to pass flatus.  She denies fevers or chills.  She denies dysuria or flank pain.  ED Course: Upon arrival to the ED, patient is found to be afebrile and saturating well on room air with normal heart rate and elevated blood pressure.  Labs are most notable for normal renal function, potassium 5.6, bicarbonate 17, anion gap 18, AST 150, ALT 84, normal WBC, beta hydroxybutyrate 1.32, lactic acid 2.3, normal lipase, and ketonuria.  CT of the abdomen pelvis reveals mildly dilated distal ileal segments in the RLQ which could reflect ileus or partial SBO.  Also noted on CT is stool distended bowel in the LLQ and hepatic steatosis that has increased since November 2024.  Surgery (Dr. Luisa Hart) was consulted by the ED physician and the patient was given a liter of LR, Ativan, Zofran, and Dilaudid.  Review of Systems:  All other systems reviewed and apart from HPI, are negative.  Past Medical History:  Diagnosis Date   Anxiety    Depression    H/O gastric bypass    Insomnia    Marijuana use 01/08/2021   Moderate alcohol use disorder (HCC) 01/08/2021    Past Surgical History:  Procedure Laterality Date    CHOLECYSTECTOMY     ESOPHAGOGASTRODUODENOSCOPY (EGD) WITH PROPOFOL N/A 06/29/2023   Procedure: ESOPHAGOGASTRODUODENOSCOPY (EGD) WITH PROPOFOL;  Surgeon: Vida Rigger, MD;  Location: [facility];  Service: Gastroenterology;  Laterality: N/A;  Possible balloon dilation   GASTRIC BYPASS     GASTRIC BYPASS     LAPAROSCOPY N/A 08/11/2019   Procedure: LAPAROSCOPY DIAGNOSTIC;  Surgeon: Harriette Bouillon, MD;  Location: [facility] OR;  Service: General;  Laterality: N/A;    Social History:   reports that she has never smoked. She has never used smokeless tobacco. She reports current alcohol use of about 1.0 standard drink of alcohol per week. She reports current drug use. Drug: Marijuana.  Allergies  Allergen Reactions   Zithromax [Azithromycin] Hives    Family History  Problem Relation Age of Onset   Diabetes Mother    Diabetes Father      Prior to Admission medications   Medication Sig Start Date End Date Taking? Authorizing Provider  acetaminophen (TYLENOL) 325 MG tablet Take 2 tablets (650 mg total) by mouth every 6 (six) hours as needed for mild pain (pain score 1-3) or fever. 06/08/23   Standley Brooking, MD  ARIPiprazole (ABILIFY) 5 MG tablet Take 1 tablet (5 mg total) by mouth daily. 06/01/23   Shanna Cisco, NP  cholecalciferol (CHOLECALCIFEROL) 25 MCG tablet Take 1 tablet (1,000 Units total) by mouth daily. 07/01/23   Rai, Delene Ruffini, MD  folic acid (FOLVITE) 1 MG tablet Take 1 tablet (1 mg total) by mouth daily.  07/01/23   Rai, Delene Ruffini, MD  gabapentin (NEURONTIN) 300 MG capsule Take 1 capsule (300 mg total) by mouth 3 (three) times daily. 09/07/23 09/01/24  Windell Norfolk, MD  Oxcarbazepine (TRILEPTAL) 300 MG tablet Take 1 tablet (300 mg total) by mouth 2 (two) times daily. 09/07/23 09/01/24  Windell Norfolk, MD  oxyCODONE (OXY IR/ROXICODONE) 5 MG immediate release tablet Take 1 tablet (5 mg total) by mouth every 6 (six) hours as needed for moderate pain (pain score 4-6) or severe pain (pain  score 7-10). Patient not taking: Reported on 09/07/2023 06/30/23   Rai, Delene Ruffini, MD  pantoprazole (PROTONIX) 40 MG tablet Take 1 tablet (40 mg total) by mouth 2 (two) times daily before a meal. 06/30/23   Rai, Ripudeep K, MD  promethazine (PHENERGAN) 25 MG tablet Take 1 tablet (25 mg total) by mouth every 6 (six) hours as needed for nausea or vomiting. 06/30/23   Rai, Delene Ruffini, MD  sertraline (ZOLOFT) 100 MG tablet Take 100 mg by mouth in the morning and at bedtime. 08/25/22   [provider]  sucralfate (CARAFATE) 1 GM/10ML suspension Take 10 mLs (1 g total) by mouth 4 (four) times daily -  with meals and at bedtime. Can substitute to tablets if solution is not available. 06/30/23 07/30/23  Rai, Delene Ruffini, MD  traZODone (DESYREL) 100 MG tablet Take 2 tablets (200 mg total) by mouth at bedtime. 06/01/23   Shanna Cisco, NP    Physical Exam: Vitals:   09/18/23 1529 09/18/23 1937 09/18/23 2215 09/18/23 2318  BP: (!) 166/122 (!) 178/115 (!) 158/103   Pulse: 80 81 80   Resp: 18 16 14    Temp: 98.2 F (36.8 C) 99.1 F (37.3 C)  99.3 F (37.4 C)  TempSrc: Oral Oral  Oral  SpO2: 100% 100% 100%   Weight: 79.4 kg     Height: 5\' 6"  (1.676 m)       Constitutional: NAD, no pallor or diaphoresis   Eyes: PERTLA, lids and conjunctivae normal ENMT: Mucous membranes are dry. Posterior pharynx clear of any exudate or lesions.   Neck: supple, no masses  Respiratory: no wheezing, no crackles. No accessory muscle use.  Cardiovascular: S1 & S2 heard, regular rate and rhythm. No extremity edema.   Abdomen: No distension, no tenderness, soft. Bowel sounds active.  Musculoskeletal: no clubbing / cyanosis. No joint deformity upper and lower extremities.   Skin: no significant rashes, lesions, ulcers. Warm, dry, well-perfused. Neurologic: CN 2-12 grossly intact. Moving all extremities. Alert and oriented.  Psychiatric: Calm. Cooperative.    Labs and Imaging on Admission: I have personally  reviewed following labs and imaging studies  CBC: Recent Labs  Lab 09/18/23 2057  WBC 10.0  HGB 12.5  HCT 37.0  MCV 107.9*  PLT 208   Basic Metabolic Panel: Recent Labs  Lab 09/18/23 1552 09/18/23 1955  NA 137 139  K 5.6* 3.8  CL 102 103  CO2 17* 21*  GLUCOSE 113* 127*  BUN 5* <5*  CREATININE 0.42* 0.47  CALCIUM 8.7* 8.9   GFR: Estimated Creatinine Clearance: 107.3 mL/min (by C-G formula based on SCr of 0.47 mg/dL). Liver Function Tests: Recent Labs  Lab 09/18/23 1552  AST 150*  ALT 84*  ALKPHOS 83  BILITOT 0.5  PROT 8.2*  ALBUMIN 4.3   Recent Labs  Lab 09/18/23 1552  LIPASE 25   No results for input(s): "AMMONIA" in the last 168 hours. Coagulation Profile: No results for input(s): "INR", "PROTIME"  in the last 168 hours. Cardiac Enzymes: No results for input(s): "CKTOTAL", "CKMB", "CKMBINDEX", "TROPONINI" in the last 168 hours. BNP (last 3 results) No results for input(s): "PROBNP" in the last 8760 hours. HbA1C: No results for input(s): "HGBA1C" in the last 72 hours. CBG: No results for input(s): "GLUCAP" in the last 168 hours. Lipid Profile: No results for input(s): "CHOL", "HDL", "LDLCALC", "TRIG", "CHOLHDL", "LDLDIRECT" in the last 72 hours. Thyroid Function Tests: No results for input(s): "TSH", "T4TOTAL", "FREET4", "T3FREE", "THYROIDAB" in the last 72 hours. Anemia Panel: No results for input(s): "VITAMINB12", "FOLATE", "FERRITIN", "TIBC", "IRON", "RETICCTPCT" in the last 72 hours. Urine analysis:    Component Value Date/Time   COLORURINE YELLOW 09/18/2023 1552   APPEARANCEUR CLEAR 09/18/2023 1552   LABSPEC 1.013 09/18/2023 1552   PHURINE 6.0 09/18/2023 1552   GLUCOSEU NEGATIVE 09/18/2023 1552   HGBUR NEGATIVE 09/18/2023 1552   BILIRUBINUR NEGATIVE 09/18/2023 1552   KETONESUR 5 (A) 09/18/2023 1552   PROTEINUR NEGATIVE 09/18/2023 1552   NITRITE NEGATIVE 09/18/2023 1552   LEUKOCYTESUR NEGATIVE 09/18/2023 1552   Sepsis  Labs: @LABRCNTIP (procalcitonin:4,lacticidven:4) )No results found for this or any previous visit (from the past 240 hours).   Radiological Exams on Admission: CT ABDOMEN PELVIS W CONTRAST Result Date: 09/18/2023 CLINICAL DATA:  Abdominal pain, acute, nonlocalized. EXAM: CT ABDOMEN AND PELVIS WITH CONTRAST TECHNIQUE: Multidetector CT imaging of the abdomen and pelvis was performed using the standard protocol following bolus administration of intravenous contrast. RADIATION DOSE REDUCTION: This exam was performed according to the departmental dose-optimization program which includes automated exposure control, adjustment of the mA and/or kV according to patient size and/or use of iterative reconstruction technique. CONTRAST:  OMNIPAQUE IOHEXOL 300 MG/ML  SOLN COMPARISON:  CTs with IV contrast 06/26/2023, 06/03/2023. FINDINGS: Lower chest: There are linear scar-like opacities in the right middle lobe base. There is mild bronchial thickening in the lower lobes. No active infiltrate is seen. There is a small hiatal hernia, postsurgical change again at the GE junction. The cardiac size is normal. Hepatobiliary: The liver moderately steatotic, increasingly so since 06/26/2023, measuring 21 cm in length. There is no mass enhancement. Status post cholecystectomy with stable mild prominence of the common bile duct. Pancreas: No abnormality. Spleen: No abnormality.  No splenomegaly. Adrenals/Urinary Tract: Adrenal glands are unremarkable. Kidneys are normal, without renal calculi, focal lesion, or hydronephrosis. Bladder is unremarkable. 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 There is a stool distended small bowel segment in the left hemiabdomen not seen previously. There is no bowel dilatation associated with this, but there are mildly dilated subcecal distal ileal segments in the right lower abdomen up to 3 cm, which could be due to ileus or low-grade partial SBO. A discrete transitional segment was not seen. The  terminal ileum is normal in caliber. The large intestine is mostly contracted. There are no inflammatory changes. Vascular/Lymphatic: No significant vascular findings are present. No enlarged abdominal or pelvic lymph nodes. Reproductive: Uterus and bilateral adnexa are unremarkable. Other: No abdominal wall hernia or abnormality. No abdominopelvic ascites. Musculoskeletal: There is an old left hip pinning with advanced for age bilateral hip DJD with bilateral short femoral necks. No acute or other significant osseous findings are seen. IMPRESSION: 1. Mildly dilated subcecal distal ileal segments in the right lower abdomen up to 3 cm, which could be due to ileus or low-grade partial SBO. A discrete transitional segment was not seen. 2. Stool distended small bowel segment in the left hemiabdomen not seen previously. 3. Moderate hepatic steatosis, increasingly  so since 06/26/2023. 4. Small hiatal hernia. 5. 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 6. 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 Old left hip pinning. Electronically Signed   By: Almira Bar M.D.   On: 09/18/2023 22:36    EKG: Independently reviewed. Sinus rhythm, PACs.   Assessment/Plan   1. Intractable N/V   - Exam benign; dilated ileal segments noted on CT could reflect ileus or low-grade partial SBO  - Continue bowel rest, IVF hydration, antiemetics, consider NGT decompression if worsens   2. Metabolic acidosis with elevated AG  - Likely starvation or alcoholic ketoacidosis  - Appears to be resolving with IVF in ED, will continue IVF hydration and repeat chem panel in am    3. Hx of seizures  - Hx of EtOH related and also non-epileptic vs epileptic seizures followed by outpatient neurology  - Continue oxcarbazepine   4. Depression, anxiety, insomnia - Continue Abilify, gabapentin, trazodone    5. Alcohol abuse  - Monitor with CIWA, use Ativan as-needed, supplement vitamins    6. Elevated transaminases  - Intermittently  elevated in past  - CT demonstrates hepatic steatosis that is increased from November 2024  - Check for viral hepatitis, encourage alcohol avoidance, repeat CMP in am     DVT prophylaxis: Lovenox  Code Status: Full  Level of Care: Level of care: Med-Surg Family Communication: None present   Disposition Plan:  Patient is from: home  Anticipated d/c is to: Home  Anticipated d/c date is: 1/26 or 09/20/23  Patient currently: Pending pain-control and tolerance of adequate oral intake  Consults called: Surgery  Admission status: Observation     Briscoe Deutscher, MD [facility]  09/19/2023, 12:22 AM

## 2023-09-19 NOTE — ED Notes (Signed)
Pt reports last alcohol consumption- 09/16/2023 approx 2200

## 2023-09-20 DIAGNOSIS — R112 Nausea with vomiting, unspecified: Secondary | ICD-10-CM | POA: Diagnosis not present

## 2023-09-20 LAB — CBC
HCT: 31.9 % — ABNORMAL LOW (ref 36.0–46.0)
Hemoglobin: 10.3 g/dL — ABNORMAL LOW (ref 12.0–15.0)
MCH: 36.5 pg — ABNORMAL HIGH (ref 26.0–34.0)
MCHC: 32.3 g/dL (ref 30.0–36.0)
MCV: 113.1 fL — ABNORMAL HIGH (ref 80.0–100.0)
Platelets: 137 10*3/uL — ABNORMAL LOW (ref 150–400)
RBC: 2.82 MIL/uL — ABNORMAL LOW (ref 3.87–5.11)
RDW: 17.7 % — ABNORMAL HIGH (ref 11.5–15.5)
WBC: 6.2 10*3/uL (ref 4.0–10.5)
nRBC: 0 % (ref 0.0–0.2)

## 2023-09-20 LAB — COMPREHENSIVE METABOLIC PANEL
ALT: 61 U/L — ABNORMAL HIGH (ref 0–44)
AST: 86 U/L — ABNORMAL HIGH (ref 15–41)
Albumin: 3.3 g/dL — ABNORMAL LOW (ref 3.5–5.0)
Alkaline Phosphatase: 60 U/L (ref 38–126)
Anion gap: 9 (ref 5–15)
BUN: <5 mg/dL — ABNORMAL LOW (ref 6–20)
CO2: 24 mmol/L (ref 22–32)
Calcium: 8.6 mg/dL — ABNORMAL LOW (ref 8.9–10.3)
Chloride: 103 mmol/L (ref 98–111)
Creatinine, Ser: 0.52 mg/dL (ref 0.44–1.00)
GFR, Estimated: >60 mL/min (ref >60)
Glucose, Bld: 67 mg/dL — ABNORMAL LOW (ref 70–99)
Potassium: 3.1 mmol/L — ABNORMAL LOW (ref 3.5–5.1)
Sodium: 136 mmol/L (ref 135–145)
Total Bilirubin: 0.9 mg/dL (ref 0.0–1.2)
Total Protein: 5.6 g/dL — ABNORMAL LOW (ref 6.5–8.1)

## 2023-09-20 MED ORDER — POLYETHYLENE GLYCOL 3350 17 G PO PACK
17.0000 g | PACK | Freq: Two times a day (BID) | ORAL | Status: DC
  Administered 2023-09-20 (×2): 17 g via ORAL
  Filled 2023-09-20 (×2): qty 1

## 2023-09-20 NOTE — Plan of Care (Signed)

## 2023-09-20 NOTE — Progress Notes (Signed)
Subjective: States her pain is generally lower in her abdomen and comes up the central aspect.  It is not necessarily associated with oral intake.  It's about the same since admission.  Only has a BM once a week or maybe even less frequent.  She says that she takes miralax daily at home but it doesn't help her.  ROS: See above, otherwise other systems negative  Objective: Vital signs in last 24 hours: Temp:  [98.4 F (36.9 C)-99.4 F (37.4 C)] 98.4 F (36.9 C) (01/27 0627) Pulse Rate:  [67-94] 71 (01/27 0627) Resp:  [16-18] 18 (01/26 2237) BP: (111-145)/(67-99) 115/67 (01/27 0627) SpO2:  [99 %-100 %] 99 % (01/27 0627)    Intake/Output from previous day: 01/26 0701 - 01/27 0700 In: 472.3 [P.O.:130; I.V.:342.3] Out: 0  Intake/Output this shift: No intake/output data recorded.  PE: Gen: NAD Lungs: respiratory effort nonlabored Abd: soft, ND, mild lower abdominal tenderness and some centrally, but no guarding or rebounding.  Lab Results:  Recent Labs    09/19/23 0830 09/20/23 0304  WBC 8.9 6.2  HGB 12.0 10.3*  HCT 36.4 31.9*  PLT 175 137*   BMET Recent Labs    09/19/23 0830 09/20/23 0304  NA 137 136  K 3.4* 3.1*  CL 101 103  CO2 24 24  GLUCOSE 78 67*  BUN 6 <5*  CREATININE 0.53 0.52  CALCIUM 8.8* 8.6*   PT/INR Recent Labs    09/19/23 0830  LABPROT 13.3  INR 1.0   CMP     Component Value Date/Time   NA 136 09/20/2023 0304   K 3.1 (L) 09/20/2023 0304   CL 103 09/20/2023 0304   CO2 24 09/20/2023 0304   GLUCOSE 67 (L) 09/20/2023 0304   BUN <5 (L) 09/20/2023 0304   CREATININE 0.52 09/20/2023 0304   CALCIUM 8.6 (L) 09/20/2023 0304   PROT 5.6 (L) 09/20/2023 0304   ALBUMIN 3.3 (L) 09/20/2023 0304   AST 86 (H) 09/20/2023 0304   ALT 61 (H) 09/20/2023 0304   ALKPHOS 60 09/20/2023 0304   BILITOT 0.9 09/20/2023 0304   GFRNONAA >60 09/20/2023 0304   GFRAA >60 12/27/2019 0810   Lipase     Component Value Date/Time   LIPASE 25 09/18/2023 1552        Studies/Results: CT ENTERO ABD/PELVIS W CONTAST Result Date: 09/19/2023 CLINICAL DATA:  Persistent nausea and vomiting. EXAM: CT ABDOMEN AND PELVIS WITH CONTRAST (ENTEROGRAPHY) TECHNIQUE: Multidetector CT of the abdomen and pelvis during bolus administration of intravenous contrast. Negative oral contrast was given. RADIATION DOSE REDUCTION: This exam was performed according to the departmental dose-optimization program which includes automated exposure control, adjustment of the mA and/or kV according to patient size and/or use of iterative reconstruction technique. CONTRAST:  OMNIPAQUE IOHEXOL 300 MG/ML  SOLN COMPARISON:  09/18/2023. FINDINGS: Lower chest:  No acute findings. Hepatobiliary: Fatty infiltration of the liver is noted. The gallbladder is surgically absent. No biliary ductal dilatation. Pancreas: No mass, inflammatory changes, or other significant abnormality. Spleen: Within normal limits in size and appearance. Adrenals/Urinary Tract: The adrenal glands are within normal limits. The kidneys enhance symmetrically. No renal calculus or hydronephrosis bilaterally. The bladder is unremarkable. Stomach/Bowel: A small hiatal hernia is present. Gastric bypass changes are noted. An enteric tube terminates in the gastric pouch. No evidence of obstruction, inflammatory process, focal bowel wall thickening, or abnormal fluid collections. No free air or pneumatosis is seen. A normal appendix is seen in the right  lower quadrant. A few patulous loops of small bowel are noted in the mid left abdomen at an anastomotic site without evidence of obstruction. Air-fluid attenuation is noted in the ascending colon. Vascular/Lymphatic: No pathologically enlarged lymph nodes. No evidence of abdominal aortic aneurysm Reproductive: No mass or other significant abnormality. Other: No abdominopelvic ascites. Musculoskeletal: Fixation hardware is noted in the proximal left femur. No acute osseous abnormality  is seen. IMPRESSION: 1. A few distended patulous loops of small bowel in the mid left abdomen at an anastomotic site. No evidence of bowel obstruction is seen. 2. Air-fluid levels in the ascending colon which may be associated with diarrheal illness. 3. Small hiatal hernia and gastric bypass surgical changes. 4. Hepatic steatosis. Electronically Signed   By: Thornell Sartorius M.D.   On: 09/19/2023 19:23   DG Abd Portable 1V Result Date: 09/19/2023 CLINICAL DATA:  Encounter for feeding tube placement. History of gastric bypass 06/04/2023 EXAM: PORTABLE ABDOMEN - 1 VIEW COMPARISON:  Upper GI 06/04/2023, CT abdomen and pelvis 06/03/2023 and 06/26/2023 FINDINGS: Enteric tube descends below the diaphragm with the tip overlying left upper quadrant, appearing to be in the region of the distal aspect of the proximal gastric pouch versus crossing into the efferent limb (gastrojejunal limb) following prior gastric bypass surgery. Surgical suture is again seen within left upper quadrant, and on 06/03/2023 in 06/26/2023 CTs this suture line was seen to crescent the diaphragmatic hiatus as a small hiatal hernia. The side port appears to be at the distal aspect of the postsurgical gastric pouch. Nonobstructed bowel-gas pattern. The lung bases are clear. No acute skeletal abnormality. IMPRESSION: Enteric tube descends below the diaphragm with the tip overlying left upper quadrant, appearing to be in the region of the distal aspect of the proximal gastric pouch versus crossing into the efferent limb (gastrojejunal limb) following prior gastric bypass surgery. Electronically Signed   By: Neita Garnet M.D.   On: 09/19/2023 16:11   CT ABDOMEN PELVIS W CONTRAST Result Date: 09/18/2023 CLINICAL DATA:  Abdominal pain, acute, nonlocalized. EXAM: CT ABDOMEN AND PELVIS WITH CONTRAST TECHNIQUE: Multidetector CT imaging of the abdomen and pelvis was performed using the standard protocol following bolus administration of intravenous  contrast. RADIATION DOSE REDUCTION: This exam was performed according to the departmental dose-optimization program which includes automated exposure control, adjustment of the mA and/or kV according to patient size and/or use of iterative reconstruction technique. CONTRAST:  OMNIPAQUE IOHEXOL 300 MG/ML  SOLN COMPARISON:  CTs with IV contrast 06/26/2023, 06/03/2023. FINDINGS: Lower chest: There are linear scar-like opacities in the right middle lobe base. There is mild bronchial thickening in the lower lobes. No active infiltrate is seen. There is a small hiatal hernia, postsurgical change again at the GE junction. The cardiac size is normal. Hepatobiliary: The liver moderately steatotic, increasingly so since 06/26/2023, measuring 21 cm in length. There is no mass enhancement. Status post cholecystectomy with stable mild prominence of the common bile duct. Pancreas: No abnormality. Spleen: No abnormality.  No splenomegaly. Adrenals/Urinary Tract: Adrenal glands are unremarkable. Kidneys are normal, without renal calculi, focal lesion, or hydronephrosis. Bladder is unremarkable. 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 There is a stool distended small bowel segment in the left hemiabdomen not seen previously. There is no bowel dilatation associated with this, but there are mildly dilated subcecal distal ileal segments in the right lower abdomen up to 3 cm, which could be due to ileus or low-grade partial SBO. A discrete transitional segment was not seen. The terminal ileum  is normal in caliber. The large intestine is mostly contracted. There are no inflammatory changes. Vascular/Lymphatic: No significant vascular findings are present. No enlarged abdominal or pelvic lymph nodes. Reproductive: Uterus and bilateral adnexa are unremarkable. Other: No abdominal wall hernia or abnormality. No abdominopelvic ascites. Musculoskeletal: There is an old left hip pinning with advanced for age bilateral hip DJD with bilateral  short femoral necks. No acute or other significant osseous findings are seen. IMPRESSION: 1. Mildly dilated subcecal distal ileal segments in the right lower abdomen up to 3 cm, which could be due to ileus or low-grade partial SBO. A discrete transitional segment was not seen. 2. Stool distended small bowel segment in the left hemiabdomen not seen previously. 3. Moderate hepatic steatosis, increasingly so since 06/26/2023. 4. Small hiatal hernia. 5. 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 6. 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 Old left hip pinning. Electronically Signed   By: Almira Bar M.D.   On: 09/18/2023 22:36    Anti-infectives: Anti-infectives (From admission, onward)    None        Assessment/Plan History of RYGB over a decade ago, chronic abdominal pain, N/V -unclear etiology.   -normal EGD in 11/24, symptoms don't sound like ulcer disease -CTE reviewed and negative for any acute findings.  Anastomosis is patulous with bypass surgery, so this is a normal finding -will put on miralax BID to help increase the frequency of her BMs and maybe some promotility help. -try CLD today -d/w primary service.  FEN - CLD VTE - Lovenox ID - none currently needed  ETOH use Vape use Anxiety/depression Marijuana use - certainly could be contributing to chronic N/V.  Recommend cessation.  I reviewed hospitalist notes, last 24 h vitals and pain scores, last 48 h intake and output, last 24 h labs and trends, and last 24 h imaging results.   LOS: 1 day    Letha Cape , [facility] 09/20/2023, 9:14 AM Please see Amion for pager number during day hours 7:00am-4:30pm or 7:00am -11:30am on weekends

## 2023-09-20 NOTE — Plan of Care (Signed)
  Problem: Coping: Goal: Level of anxiety will decrease Outcome: Progressing   Problem: Pain Managment: Goal: General experience of comfort will improve and/or be controlled Outcome: Progressing

## 2023-09-20 NOTE — Progress Notes (Signed)
PROGRESS NOTE    Marie Myers  WGN:[id number] DOB: March 05, 1991 DOA: 09/18/2023 PCP: Delfino Lovett, FNP   Brief Narrative:  Marie Myers is a 33 y.o. 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   Assessment & Plan:   Principal Problem:   Intractable nausea and vomiting Active Problems:   Seizure disorder (HCC)   Metabolic acidemia   Alcohol use disorder, severe, dependence (HCC)   MDD (major depressive disorder), recurrent episode, severe (HCC)   Anxiety   Elevated transaminase level  Intractable N/V Rule out partial SBO -CT with contrast appears to be improving per surgery -Continue to advance diet per surgery recommendations -Previous NG placed per patient's request for comfort has been removed by patient due to discomfort  -Otherwise continue supportive care, analgesia and antiemetics as necessary  Metabolic acidosis with elevated AG secondary to lactic acidosis, resolved - Resolved with IV fluids   Hx of Etoh withdrawal and non vs epileptic seizures  - Continue oxcarbazepine    Depression, anxiety, insomnia - Continue Abilify, gabapentin, trazodone     Alcohol abuse  - Monitor with CIWA, use Ativan as-needed, supplement vitamins     Elevated transaminases  - Intermittently elevated in past  - CT demonstrates hepatic steatosis that is increased from November 2024  -Hepatitis panel negative, salicylate level negative, lipase negative -Levels appear to be stabilizing if not minimally downtrending  DVT prophylaxis: enoxaparin (LOVENOX) injection 40 mg Start: 09/19/23 1000   Code Status:   Code Status: Full Code  Family Communication: None present  Status is: Inpatient  Dispo: The patient is from: Home              Anticipated d/c is to: Home              Anticipated d/c date  is: 24 to 48 hours              Patient currently not medically stable for discharge  Consultants:  General surgery  Procedures:  None  Antimicrobials:  None  Subjective: No acute issues or events overnight, nausea vomiting moderately well-controlled today.  Objective: Vitals:   09/19/23 1739 09/19/23 1838 09/19/23 2237 09/20/23 0627  BP: 127/85 120/73 111/68 115/67  Pulse: 77 67 80 71  Resp: 18 18 18    Temp: 98.7 F (37.1 C) 98.5 F (36.9 C) 98.4 F (36.9 C) 98.4 F (36.9 C)  TempSrc: Oral Oral Oral Oral  SpO2: 100% 100% 99% 99%  Weight:      Height:        Intake/Output Summary (Last 24 hours) at 09/20/2023 0715 Last data filed at 09/20/2023 0200 Gross per 24 hour  Intake 172.3 ml  Output 0 ml  Net 172.3 ml   Filed Weights   09/18/23 1529  Weight: 79.4 kg    Examination:  General:  Pleasantly resting in bed, No acute distress. HEENT:  Normocephalic atraumatic.  Sclerae nonicteric, noninjected.  Extraocular movements intact bilaterally. Neck:  Without mass or deformity.  Trachea is midline. Lungs:  Clear to auscultate bilaterally without rhonchi, wheeze, or rales. Heart:  Regular rate and rhythm.  Without murmurs, rubs, or gallops. Abdomen:  Soft, minimally tender without rebound or guarding Extremities: Without cyanosis, clubbing, edema, or obvious deformity. Skin:  Warm and dry, no erythema.   Data Reviewed: I have personally reviewed following labs and imaging studies  CBC: Recent Labs  Lab 09/18/23 2057 09/19/23 0830 09/20/23 0304  WBC 10.0 8.9 6.2  HGB 12.5 12.0 10.3*  HCT 37.0 36.4 31.9*  MCV 107.9* 111.3* 113.1*  PLT 208 175 137*   Basic Metabolic Panel: Recent Labs  Lab 09/18/23 1552 09/18/23 1955 09/19/23 0830 09/20/23 0304  NA 137 139 137 136  K 5.6* 3.8 3.4* 3.1*  CL 102 103 101 103  CO2 17* 21* 24 24  GLUCOSE 113* 127* 78 67*  BUN 5* <5* 6 <5*  CREATININE 0.42* 0.47 0.53 0.52  CALCIUM 8.7* 8.9 8.8* 8.6*  MG  --   --  2.1   --    GFR: Estimated Creatinine Clearance: 107.3 mL/min (by C-G formula based on SCr of 0.52 mg/dL). Liver Function Tests: Recent Labs  Lab 09/18/23 1552 09/19/23 0830 09/20/23 0304  AST 150* 89* 86*  ALT 84* 68* 61*  ALKPHOS 83 67 60  BILITOT 0.5 0.9 0.9  PROT 8.2* 7.1 5.6*  ALBUMIN 4.3 4.0 3.3*   Recent Labs  Lab 09/18/23 1552  LIPASE 25   No results for input(s): "AMMONIA" in the last 168 hours. Coagulation Profile: Recent Labs  Lab 09/19/23 0830  INR 1.0   Sepsis Labs: Recent Labs  Lab 09/18/23 2051 09/18/23 2229  LATICACIDVEN 2.3* 1.4   No results found for this or any previous visit (from the past 240 hours).   Radiology Studies: CT ENTERO ABD/PELVIS W CONTAST Result Date: 09/19/2023 CLINICAL DATA:  Persistent nausea and vomiting. EXAM: CT ABDOMEN AND PELVIS WITH CONTRAST (ENTEROGRAPHY) TECHNIQUE: Multidetector CT of the abdomen and pelvis during bolus administration of intravenous contrast. Negative oral contrast was given. RADIATION DOSE REDUCTION: This exam was performed according to the departmental dose-optimization program which includes automated exposure control, adjustment of the mA and/or kV according to patient size and/or use of iterative reconstruction technique. CONTRAST:  OMNIPAQUE IOHEXOL 300 MG/ML  SOLN COMPARISON:  09/18/2023. FINDINGS: Lower chest:  No acute findings. Hepatobiliary: Fatty infiltration of the liver is noted. The gallbladder is surgically absent. No biliary ductal dilatation. Pancreas: No mass, inflammatory changes, or other significant abnormality. Spleen: Within normal limits in size and appearance. Adrenals/Urinary Tract: The adrenal glands are within normal limits. The kidneys enhance symmetrically. No renal calculus or hydronephrosis bilaterally. The bladder is unremarkable. Stomach/Bowel: A small hiatal hernia is present. Gastric bypass changes are noted. An enteric tube terminates in the gastric pouch. No evidence of  obstruction, inflammatory process, focal bowel wall thickening, or abnormal fluid collections. No free air or pneumatosis is seen. A normal appendix is seen in the right lower quadrant. A few patulous loops of small bowel are noted in the mid left abdomen at an anastomotic site without evidence of obstruction. Air-fluid attenuation is noted in the ascending colon. Vascular/Lymphatic: No pathologically enlarged lymph nodes. No evidence of abdominal aortic aneurysm Reproductive: No mass or other significant abnormality. Other: No abdominopelvic ascites. Musculoskeletal: Fixation hardware is noted in the proximal left femur. No acute osseous abnormality is seen. IMPRESSION: 1. A few distended patulous loops of small bowel in the mid left abdomen at an anastomotic site. No evidence of bowel obstruction is seen. 2. Air-fluid levels in the ascending colon which may be associated with diarrheal illness. 3. Small hiatal hernia and gastric bypass surgical changes. 4. Hepatic steatosis. Electronically Signed   By: Thornell Sartorius M.D.   On: 09/19/2023 19:23   DG Abd Portable 1V Result Date: 09/19/2023 CLINICAL DATA:  Encounter for feeding tube placement. History of gastric bypass  06/04/2023 EXAM: PORTABLE ABDOMEN - 1 VIEW COMPARISON:  Upper GI 06/04/2023, CT abdomen and pelvis 06/03/2023 and 06/26/2023 FINDINGS: Enteric tube descends below the diaphragm with the tip overlying left upper quadrant, appearing to be in the region of the distal aspect of the proximal gastric pouch versus crossing into the efferent limb (gastrojejunal limb) following prior gastric bypass surgery. Surgical suture is again seen within left upper quadrant, and on 06/03/2023 in 06/26/2023 CTs this suture line was seen to crescent the diaphragmatic hiatus as a small hiatal hernia. The side port appears to be at the distal aspect of the postsurgical gastric pouch. Nonobstructed bowel-gas pattern. The lung bases are clear. No acute skeletal abnormality.  IMPRESSION: Enteric tube descends below the diaphragm with the tip overlying left upper quadrant, appearing to be in the region of the distal aspect of the proximal gastric pouch versus crossing into the efferent limb (gastrojejunal limb) following prior gastric bypass surgery. Electronically Signed   By: Neita Garnet M.D.   On: 09/19/2023 16:11   CT ABDOMEN PELVIS W CONTRAST Result Date: 09/18/2023 CLINICAL DATA:  Abdominal pain, acute, nonlocalized. EXAM: CT ABDOMEN AND PELVIS WITH CONTRAST TECHNIQUE: Multidetector CT imaging of the abdomen and pelvis was performed using the standard protocol following bolus administration of intravenous contrast. RADIATION DOSE REDUCTION: This exam was performed according to the departmental dose-optimization program which includes automated exposure control, adjustment of the mA and/or kV according to patient size and/or use of iterative reconstruction technique. CONTRAST:  OMNIPAQUE IOHEXOL 300 MG/ML  SOLN COMPARISON:  CTs with IV contrast 06/26/2023, 06/03/2023. FINDINGS: Lower chest: There are linear scar-like opacities in the right middle lobe base. There is mild bronchial thickening in the lower lobes. No active infiltrate is seen. There is a small hiatal hernia, postsurgical change again at the GE junction. The cardiac size is normal. Hepatobiliary: The liver moderately steatotic, increasingly so since 06/26/2023, measuring 21 cm in length. There is no mass enhancement. Status post cholecystectomy with stable mild prominence of the common bile duct. Pancreas: No abnormality. Spleen: No abnormality.  No splenomegaly. Adrenals/Urinary Tract: Adrenal glands are unremarkable. Kidneys are normal, without renal calculi, focal lesion, or hydronephrosis. Bladder is unremarkable. 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 There is a stool distended small bowel segment in the left hemiabdomen not seen previously. There is no bowel dilatation associated with this, but there are  mildly dilated subcecal distal ileal segments in the right lower abdomen up to 3 cm, which could be due to ileus or low-grade partial SBO. A discrete transitional segment was not seen. The terminal ileum is normal in caliber. The large intestine is mostly contracted. There are no inflammatory changes. Vascular/Lymphatic: No significant vascular findings are present. No enlarged abdominal or pelvic lymph nodes. Reproductive: Uterus and bilateral adnexa are unremarkable. Other: No abdominal wall hernia or abnormality. No abdominopelvic ascites. Musculoskeletal: There is an old left hip pinning with advanced for age bilateral hip DJD with bilateral short femoral necks. No acute or other significant osseous findings are seen. IMPRESSION: 1. Mildly dilated subcecal distal ileal segments in the right lower abdomen up to 3 cm, which could be due to ileus or low-grade partial SBO. A discrete transitional segment was not seen. 2. Stool distended small bowel segment in the left hemiabdomen not seen previously. 3. Moderate hepatic steatosis, increasingly so since 06/26/2023. 4. Small hiatal hernia. 5. 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 6. 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 Old left hip pinning. Electronically Signed   By:  Almira Bar M.D.   On: 09/18/2023 22:36        Scheduled Meds:  ARIPiprazole  5 mg Oral Daily   enoxaparin (LOVENOX) injection  40 mg Subcutaneous Q24H   folic acid  1 mg Oral Daily   gabapentin  300 mg Oral TID   LORazepam  0-4 mg Intravenous Q6H   Followed by   Melene Muller ON 09/21/2023] LORazepam  0-4 mg Intravenous Q12H   multivitamin with minerals  1 tablet Oral Daily   Oxcarbazepine  300 mg Oral BID   pantoprazole (PROTONIX) IV  40 mg Intravenous Q24H   thiamine  100 mg Oral Daily   Or   thiamine  100 mg Intravenous Daily   traZODone  200 mg Oral QHS    LOS: 1 day   Time spent:  Azucena Fallen, DO [facility]  If 7PM-7AM, please contact  night-coverage [email or web address]  09/20/2023, 7:15 AM

## 2023-09-21 DIAGNOSIS — R112 Nausea with vomiting, unspecified: Secondary | ICD-10-CM | POA: Diagnosis not present

## 2023-09-21 LAB — CBC
HCT: 30.5 % — ABNORMAL LOW (ref 36.0–46.0)
Hemoglobin: 10.1 g/dL — ABNORMAL LOW (ref 12.0–15.0)
MCH: 36.9 pg — ABNORMAL HIGH (ref 26.0–34.0)
MCHC: 33.1 g/dL (ref 30.0–36.0)
MCV: 111.3 fL — ABNORMAL HIGH (ref 80.0–100.0)
Platelets: 137 10*3/uL — ABNORMAL LOW (ref 150–400)
RBC: 2.74 MIL/uL — ABNORMAL LOW (ref 3.87–5.11)
RDW: 17.4 % — ABNORMAL HIGH (ref 11.5–15.5)
WBC: 5.8 10*3/uL (ref 4.0–10.5)
nRBC: 0 % (ref 0.0–0.2)

## 2023-09-21 LAB — COMPREHENSIVE METABOLIC PANEL
ALT: 51 U/L — ABNORMAL HIGH (ref 0–44)
AST: 57 U/L — ABNORMAL HIGH (ref 15–41)
Albumin: 3 g/dL — ABNORMAL LOW (ref 3.5–5.0)
Alkaline Phosphatase: 57 U/L (ref 38–126)
Anion gap: 11 (ref 5–15)
BUN: <5 mg/dL — ABNORMAL LOW (ref 6–20)
CO2: 23 mmol/L (ref 22–32)
Calcium: 8.3 mg/dL — ABNORMAL LOW (ref 8.9–10.3)
Chloride: 102 mmol/L (ref 98–111)
Creatinine, Ser: 0.6 mg/dL (ref 0.44–1.00)
GFR, Estimated: >60 mL/min (ref >60)
Glucose, Bld: 69 mg/dL — ABNORMAL LOW (ref 70–99)
Potassium: 2.9 mmol/L — ABNORMAL LOW (ref 3.5–5.1)
Sodium: 136 mmol/L (ref 135–145)
Total Bilirubin: 0.9 mg/dL (ref 0.0–1.2)
Total Protein: 5.6 g/dL — ABNORMAL LOW (ref 6.5–8.1)

## 2023-09-21 MED ORDER — LORAZEPAM 0.5 MG PO TABS: 0.5000 mg | ORAL_TABLET | Freq: Three times a day (TID) | ORAL | Status: DC

## 2023-09-21 MED ORDER — POTASSIUM CHLORIDE CRYS ER 20 MEQ PO TBCR
40.0000 meq | EXTENDED_RELEASE_TABLET | Freq: Once | ORAL | Status: DC
  Filled 2023-09-21: qty 2

## 2023-09-21 NOTE — Plan of Care (Signed)

## 2023-09-21 NOTE — Discharge Summary (Signed)
Physician Discharge Summary  Jeannett Dekoning ZOX:[id number] DOB: Apr 25, 1991 DOA: 09/18/2023  PCP: Delfino Lovett, FNP  Admit date: 09/18/2023 Discharge date: 09/21/2023  Admitted From: Home Disposition: Home  Brief/Interim Summary: Marie Myers is a 33 y.o. 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   Patient symptoms are likely motility issue at baseline given symptoms with normal imaging.  Recent EGD November 2024 with Dr. Ewing Schlein was remarkable for esophagitis but otherwise unremarkable.  Plan to advance patient's diet to ensure safe disposition however patient left hospital AGAINST MEDICAL ADVICE this morning prior to evaluation.  Previously recommended to follow-up outpatient with GI.  Discharge Diagnoses:  Principal Problem:   Intractable nausea and vomiting Active Problems:   Seizure disorder (HCC)   Metabolic acidemia   Alcohol use disorder, severe, dependence (HCC)   MDD (major depressive disorder), recurrent episode, severe (HCC)   Anxiety   Elevated transaminase level    Discharge Instructions   Allergies as of 09/21/2023       Reactions   Zithromax [azithromycin] Hives        Medication List     ASK your doctor about these medications    acetaminophen 325 MG tablet Commonly known as: TYLENOL Take 2 tablets (650 mg total) by mouth every 6 (six) hours as needed for mild pain (pain score 1-3) or fever.   ARIPiprazole 5 MG tablet Commonly known as: ABILIFY Take 1 tablet (5 mg total) by mouth daily.   folic acid 1 MG tablet Commonly known as: FOLVITE Take 1 tablet (1 mg total) by mouth daily.   gabapentin 300 MG capsule Commonly known as: NEURONTIN Take 1 capsule (300 mg total) by mouth 3 (three) times daily.   Oxcarbazepine 300 MG tablet Commonly known as:  TRILEPTAL Take 1 tablet (300 mg total) by mouth 2 (two) times daily.   oxyCODONE 5 MG immediate release tablet Commonly known as: Oxy IR/ROXICODONE Take 1 tablet (5 mg total) by mouth every 6 (six) hours as needed for moderate pain (pain score 4-6) or severe pain (pain score 7-10).   pantoprazole 40 MG tablet Commonly known as: PROTONIX Take 1 tablet (40 mg total) by mouth 2 (two) times daily before a meal.   promethazine 25 MG tablet Commonly known as: PHENERGAN Take 1 tablet (25 mg total) by mouth every 6 (six) hours as needed for nausea or vomiting.   sertraline 100 MG tablet Commonly known as: ZOLOFT Take 100 mg by mouth in the morning and at bedtime.   sucralfate 1 GM/10ML suspension Commonly known as: CARAFATE Take 10 mLs (1 g total) by mouth 4 (four) times daily -  with meals and at bedtime. Can substitute to tablets if solution is not available.   traZODone 100 MG tablet Commonly known as: DESYREL Take 2 tablets (200 mg total) by mouth at bedtime.   vitamin D3 25 MCG tablet Commonly known as: CHOLECALCIFEROL Take 1 tablet (1,000 Units total) by mouth daily.        Allergies  Allergen Reactions   Zithromax [Azithromycin] Hives    Consultations: General surgery  Procedures/Studies: CT ENTERO ABD/PELVIS W CONTAST Result Date: 09/19/2023 CLINICAL DATA:  Persistent nausea and vomiting. EXAM: CT ABDOMEN AND PELVIS WITH CONTRAST (ENTEROGRAPHY) TECHNIQUE: Multidetector CT of the abdomen and pelvis during bolus administration of intravenous contrast. Negative oral contrast was given. RADIATION DOSE REDUCTION: This exam  was performed according to the departmental dose-optimization program which includes automated exposure control, adjustment of the mA and/or kV according to patient size and/or use of iterative reconstruction technique. CONTRAST:  OMNIPAQUE IOHEXOL 300 MG/ML  SOLN COMPARISON:  09/18/2023. FINDINGS: Lower chest:  No acute findings. Hepatobiliary: Fatty  infiltration of the liver is noted. The gallbladder is surgically absent. No biliary ductal dilatation. Pancreas: No mass, inflammatory changes, or other significant abnormality. Spleen: Within normal limits in size and appearance. Adrenals/Urinary Tract: The adrenal glands are within normal limits. The kidneys enhance symmetrically. No renal calculus or hydronephrosis bilaterally. The bladder is unremarkable. Stomach/Bowel: A small hiatal hernia is present. Gastric bypass changes are noted. An enteric tube terminates in the gastric pouch. No evidence of obstruction, inflammatory process, focal bowel wall thickening, or abnormal fluid collections. No free air or pneumatosis is seen. A normal appendix is seen in the right lower quadrant. A few patulous loops of small bowel are noted in the mid left abdomen at an anastomotic site without evidence of obstruction. Air-fluid attenuation is noted in the ascending colon. Vascular/Lymphatic: No pathologically enlarged lymph nodes. No evidence of abdominal aortic aneurysm Reproductive: No mass or other significant abnormality. Other: No abdominopelvic ascites. Musculoskeletal: Fixation hardware is noted in the proximal left femur. No acute osseous abnormality is seen. IMPRESSION: 1. A few distended patulous loops of small bowel in the mid left abdomen at an anastomotic site. No evidence of bowel obstruction is seen. 2. Air-fluid levels in the ascending colon which may be associated with diarrheal illness. 3. Small hiatal hernia and gastric bypass surgical changes. 4. Hepatic steatosis. Electronically Signed   By: Thornell Sartorius M.D.   On: 09/19/2023 19:23   DG Abd Portable 1V Result Date: 09/19/2023 CLINICAL DATA:  Encounter for feeding tube placement. History of gastric bypass 06/04/2023 EXAM: PORTABLE ABDOMEN - 1 VIEW COMPARISON:  Upper GI 06/04/2023, CT abdomen and pelvis 06/03/2023 and 06/26/2023 FINDINGS: Enteric tube descends below the diaphragm with the tip  overlying left upper quadrant, appearing to be in the region of the distal aspect of the proximal gastric pouch versus crossing into the efferent limb (gastrojejunal limb) following prior gastric bypass surgery. Surgical suture is again seen within left upper quadrant, and on 06/03/2023 in 06/26/2023 CTs this suture line was seen to crescent the diaphragmatic hiatus as a small hiatal hernia. The side port appears to be at the distal aspect of the postsurgical gastric pouch. Nonobstructed bowel-gas pattern. The lung bases are clear. No acute skeletal abnormality. IMPRESSION: Enteric tube descends below the diaphragm with the tip overlying left upper quadrant, appearing to be in the region of the distal aspect of the proximal gastric pouch versus crossing into the efferent limb (gastrojejunal limb) following prior gastric bypass surgery. Electronically Signed   By: Neita Garnet M.D.   On: 09/19/2023 16:11   CT ABDOMEN PELVIS W CONTRAST Result Date: 09/18/2023 CLINICAL DATA:  Abdominal pain, acute, nonlocalized. EXAM: CT ABDOMEN AND PELVIS WITH CONTRAST TECHNIQUE: Multidetector CT imaging of the abdomen and pelvis was performed using the standard protocol following bolus administration of intravenous contrast. RADIATION DOSE REDUCTION: This exam was performed according to the departmental dose-optimization program which includes automated exposure control, adjustment of the mA and/or kV according to patient size and/or use of iterative reconstruction technique. CONTRAST:  OMNIPAQUE IOHEXOL 300 MG/ML  SOLN COMPARISON:  CTs with IV contrast 06/26/2023, 06/03/2023. FINDINGS: Lower chest: There are linear scar-like opacities in the right middle lobe base. There is  mild bronchial thickening in the lower lobes. No active infiltrate is seen. There is a small hiatal hernia, postsurgical change again at the GE junction. The cardiac size is normal. Hepatobiliary: The liver moderately steatotic, increasingly so since  06/26/2023, measuring 21 cm in length. There is no mass enhancement. Status post cholecystectomy with stable mild prominence of the common bile duct. Pancreas: No abnormality. Spleen: No abnormality.  No splenomegaly. Adrenals/Urinary Tract: Adrenal glands are unremarkable. Kidneys are normal, without renal calculi, focal lesion, or hydronephrosis. Bladder is unremarkable. 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 There is a stool distended small bowel segment in the left hemiabdomen not seen previously. There is no bowel dilatation associated with this, but there are mildly dilated subcecal distal ileal segments in the right lower abdomen up to 3 cm, which could be due to ileus or low-grade partial SBO. A discrete transitional segment was not seen. The terminal ileum is normal in caliber. The large intestine is mostly contracted. There are no inflammatory changes. Vascular/Lymphatic: No significant vascular findings are present. No enlarged abdominal or pelvic lymph nodes. Reproductive: Uterus and bilateral adnexa are unremarkable. Other: No abdominal wall hernia or abnormality. No abdominopelvic ascites. Musculoskeletal: There is an old left hip pinning with advanced for age bilateral hip DJD with bilateral short femoral necks. No acute or other significant osseous findings are seen. IMPRESSION: 1. Mildly dilated subcecal distal ileal segments in the right lower abdomen up to 3 cm, which could be due to ileus or low-grade partial SBO. A discrete transitional segment was not seen. 2. Stool distended small bowel segment in the left hemiabdomen not seen previously. 3. Moderate hepatic steatosis, increasingly so since 06/26/2023. 4. Small hiatal hernia. 5. 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 6. 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 Old left hip pinning. Electronically Signed   By: Almira Bar M.D.   On: 09/18/2023 22:36     Subjective: No acute issues or events reported overnight, patient left AGAINST MEDICAL  ADVICE prior to examination   Discharge Exam: Vitals:   09/20/23 1254 09/20/23 2206  BP: (!) 130/91 (!) 138/93  Pulse: 94 78  Resp: 16 18  Temp: 98.2 F (36.8 C) 99.2 F (37.3 C)  SpO2: 100% 97%   Vitals:   09/19/23 2237 09/20/23 0627 09/20/23 1254 09/20/23 2206  BP: 111/68 115/67 (!) 130/91 (!) 138/93  Pulse: 80 71 94 78  Resp: 18  16 18   Temp: 98.4 F (36.9 C) 98.4 F (36.9 C) 98.2 F (36.8 C) 99.2 F (37.3 C)  TempSrc: Oral Oral Oral Oral  SpO2: 99% 99% 100% 97%  Weight:      Height:        The results of significant diagnostics from this hospitalization (including imaging, microbiology, ancillary and laboratory) are listed below for reference.     Microbiology: No results found for this or any previous visit (from the past 240 hours).   Labs: BNP (last 3 results) No results for input(s): "BNP" in the last 8760 hours. Basic Metabolic Panel: Recent Labs  Lab 09/18/23 1552 09/18/23 1955 09/19/23 0830 09/20/23 0304 09/21/23 0331  NA 137 139 137 136 136  K 5.6* 3.8 3.4* 3.1* 2.9*  CL 102 103 101 103 102  CO2 17* 21* 24 24 23   GLUCOSE 113* 127* 78 67* 69*  BUN 5* <5* 6 <5* <5*  CREATININE 0.42* 0.47 0.53 0.52 0.60  CALCIUM 8.7* 8.9 8.8* 8.6* 8.3*  MG  --   --  2.1  --   --  Liver Function Tests: Recent Labs  Lab 09/18/23 1552 09/19/23 0830 09/20/23 0304 09/21/23 0331  AST 150* 89* 86* 57*  ALT 84* 68* 61* 51*  ALKPHOS 83 67 60 57  BILITOT 0.5 0.9 0.9 0.9  PROT 8.2* 7.1 5.6* 5.6*  ALBUMIN 4.3 4.0 3.3* 3.0*   Recent Labs  Lab 09/18/23 1552  LIPASE 25   No results for input(s): "AMMONIA" in the last 168 hours. CBC: Recent Labs  Lab 09/18/23 2057 09/19/23 0830 09/20/23 0304 09/21/23 0331  WBC 10.0 8.9 6.2 5.8  HGB 12.5 12.0 10.3* 10.1*  HCT 37.0 36.4 31.9* 30.5*  MCV 107.9* 111.3* 113.1* 111.3*  PLT 208 175 137* 137*   Cardiac Enzymes: No results for input(s): "CKTOTAL", "CKMB", "CKMBINDEX", "TROPONINI" in the last 168  hours. BNP: Invalid input(s): "POCBNP" CBG: No results for input(s): "GLUCAP" in the last 168 hours. D-Dimer No results for input(s): "DDIMER" in the last 72 hours. Hgb A1c No results for input(s): "HGBA1C" in the last 72 hours. Lipid Profile No results for input(s): "CHOL", "HDL", "LDLCALC", "TRIG", "CHOLHDL", "LDLDIRECT" in the last 72 hours. Thyroid function studies No results for input(s): "TSH", "T4TOTAL", "T3FREE", "THYROIDAB" in the last 72 hours.  Invalid input(s): "FREET3" Anemia work up No results for input(s): "VITAMINB12", "FOLATE", "FERRITIN", "TIBC", "IRON", "RETICCTPCT" in the last 72 hours. Urinalysis    Component Value Date/Time   COLORURINE YELLOW 09/18/2023 1552   APPEARANCEUR CLEAR 09/18/2023 1552   LABSPEC 1.013 09/18/2023 1552   PHURINE 6.0 09/18/2023 1552   GLUCOSEU NEGATIVE 09/18/2023 1552   HGBUR NEGATIVE 09/18/2023 1552   BILIRUBINUR NEGATIVE 09/18/2023 1552   KETONESUR 5 (A) 09/18/2023 1552   PROTEINUR NEGATIVE 09/18/2023 1552   NITRITE NEGATIVE 09/18/2023 1552   LEUKOCYTESUR NEGATIVE 09/18/2023 1552   Sepsis Labs Recent Labs  Lab 09/18/23 2057 09/19/23 0830 09/20/23 0304 09/21/23 0331  WBC 10.0 8.9 6.2 5.8   Microbiology No results found for this or any previous visit (from the past 240 hours).   Time coordinating discharge: Over 30 minutes  SIGNED:   Azucena Fallen, DO [facility] 09/21/2023, 4:37 PM Pager   If 7PM-7AM, please contact night-coverage [email or web address]

## 2023-09-21 NOTE — Progress Notes (Signed)
      Chief Complaint/Subjective: Nausea overnight with some retching. No BM. Intermittent abdominal pain in the lower abdomen. +Flatus no BM  Objective: Vital signs in last 24 hours: Temp:  [98.2 F (36.8 C)-99.2 F (37.3 C)] 99.2 F (37.3 C) (01/27 2206) Pulse Rate:  [78-94] 78 (01/27 2206) Resp:  [16-18] 18 (01/27 2206) BP: (130-138)/(91-93) 138/93 (01/27 2206) SpO2:  [97 %-100 %] 97 % (01/27 2206)   Intake/Output from previous day: 01/27 0701 - 01/28 0700 In: 420 [P.O.:420] Out: 0   PE: Gen: NAD Resp: nonlabored Card: RRR Abd: soft, pain on palpation throughout  Lab Results:  Recent Labs    09/20/23 0304 09/21/23 0331  WBC 6.2 5.8  HGB 10.3* 10.1*  HCT 31.9* 30.5*  PLT 137* 137*   Recent Labs    09/20/23 0304 09/21/23 0331  NA 136 136  K 3.1* 2.9*  CL 103 102  CO2 24 23  GLUCOSE 67* 69*  BUN <5* <5*  CREATININE 0.52 0.60  CALCIUM 8.6* 8.3*   Recent Labs    09/19/23 0830  LABPROT 13.3  INR 1.0      Component Value Date/Time   NA 136 09/21/2023 0331   K 2.9 (L) 09/21/2023 0331   CL 102 09/21/2023 0331   CO2 23 09/21/2023 0331   GLUCOSE 69 (L) 09/21/2023 0331   BUN <5 (L) 09/21/2023 0331   CREATININE 0.60 09/21/2023 0331   CALCIUM 8.3 (L) 09/21/2023 0331   PROT 5.6 (L) 09/21/2023 0331   ALBUMIN 3.0 (L) 09/21/2023 0331   AST 57 (H) 09/21/2023 0331   ALT 51 (H) 09/21/2023 0331   ALKPHOS 57 09/21/2023 0331   BILITOT 0.9 09/21/2023 0331   GFRNONAA >60 09/21/2023 0331   GFRAA >60 12/27/2019 0810    Assessment/Plan History of RYGB over a decade ago, chronic abdominal pain, N/V -she appears to have hypersensitive nausea cycle, addition of benzo or antispasmodic (baclofen) could help though the latter could interact with psych/seizure meds. -No acute pathology for pain -recommend follow up with GI and bariatric surgery -recommend alcohol cessation  FEN - carb mod diet VTE - lovenox ID - no issues Disposition - ok to discharge home from  surgery standpoint   LOS: 2 days   I reviewed last 24 h vitals and pain scores, last 48 h intake and output, last 24 h labs and trends, and last 24 h imaging results.  This care required moderate level of medical decision making.   [facility] 09/21/2023, 8:03 AM Please see Amion for pager number during day hours 7:00am-4:30pm or 7:00am -11:30am on weekends

## 2023-09-24 ENCOUNTER — Emergency Department (HOSPITAL_COMMUNITY): Payer: Medicaid Other

## 2023-09-24 ENCOUNTER — Encounter (HOSPITAL_COMMUNITY): Payer: Self-pay

## 2023-09-24 ENCOUNTER — Emergency Department (HOSPITAL_COMMUNITY)
Admission: EM | Admit: 2023-09-24 | Discharge: 2023-09-24 | Disposition: A | Discharge status: Home / Self Care | Payer: Medicaid Other | Attending: Emergency Medicine | Admitting: Emergency Medicine

## 2023-09-24 ENCOUNTER — Other Ambulatory Visit: Payer: Self-pay

## 2023-09-24 DIAGNOSIS — R112 Nausea with vomiting, unspecified: Secondary | ICD-10-CM | POA: Diagnosis present

## 2023-09-24 DIAGNOSIS — R109 Unspecified abdominal pain: Secondary | ICD-10-CM | POA: Insufficient documentation

## 2023-09-24 DIAGNOSIS — Z79899 Other long term (current) drug therapy: Secondary | ICD-10-CM | POA: Insufficient documentation

## 2023-09-24 DIAGNOSIS — R1111 Vomiting without nausea: Secondary | ICD-10-CM

## 2023-09-24 LAB — ETHANOL: Alcohol, Ethyl (B): <10 mg/dL (ref <10)

## 2023-09-24 LAB — URINALYSIS, ROUTINE W REFLEX MICROSCOPIC
Glucose, UA: NEGATIVE mg/dL
Hgb urine dipstick: NEGATIVE
Ketones, ur: 80 mg/dL — AB
Leukocytes,Ua: NEGATIVE
Nitrite: POSITIVE — AB
Protein, ur: 100 mg/dL — AB
Specific Gravity, Urine: 1.025 (ref 1.005–1.030)
pH: 5 (ref 5.0–8.0)

## 2023-09-24 LAB — CBC
HCT: 39.2 % (ref 36.0–46.0)
Hemoglobin: 13.2 g/dL (ref 12.0–15.0)
MCH: 36.7 pg — ABNORMAL HIGH (ref 26.0–34.0)
MCHC: 33.7 g/dL (ref 30.0–36.0)
MCV: 108.9 fL — ABNORMAL HIGH (ref 80.0–100.0)
Platelets: 261 10*3/uL (ref 150–400)
RBC: 3.6 MIL/uL — ABNORMAL LOW (ref 3.87–5.11)
RDW: 17.1 % — ABNORMAL HIGH (ref 11.5–15.5)
WBC: 8.8 10*3/uL (ref 4.0–10.5)
nRBC: 0 % (ref 0.0–0.2)

## 2023-09-24 LAB — COMPREHENSIVE METABOLIC PANEL
ALT: 56 U/L — ABNORMAL HIGH (ref 0–44)
AST: 59 U/L — ABNORMAL HIGH (ref 15–41)
Albumin: 3.9 g/dL (ref 3.5–5.0)
Alkaline Phosphatase: 71 U/L (ref 38–126)
Anion gap: 22 — ABNORMAL HIGH (ref 5–15)
BUN: 8 mg/dL (ref 6–20)
CO2: 15 mmol/L — ABNORMAL LOW (ref 22–32)
Calcium: 9.1 mg/dL (ref 8.9–10.3)
Chloride: 103 mmol/L (ref 98–111)
Creatinine, Ser: 0.63 mg/dL (ref 0.44–1.00)
GFR, Estimated: >60 mL/min (ref >60)
Glucose, Bld: 74 mg/dL (ref 70–99)
Potassium: 3.3 mmol/L — ABNORMAL LOW (ref 3.5–5.1)
Sodium: 140 mmol/L (ref 135–145)
Total Bilirubin: 2.2 mg/dL — ABNORMAL HIGH (ref 0.0–1.2)
Total Protein: 6.9 g/dL (ref 6.5–8.1)

## 2023-09-24 LAB — HCG, QUANTITATIVE, PREGNANCY: hCG, Beta Chain, Quant, S: <1 m[IU]/mL (ref <5)

## 2023-09-24 LAB — RAPID URINE DRUG SCREEN, HOSP PERFORMED
Amphetamines: NOT DETECTED
Barbiturates: NOT DETECTED
Benzodiazepines: POSITIVE — AB
Cocaine: NOT DETECTED
Opiates: NOT DETECTED
Tetrahydrocannabinol: POSITIVE — AB

## 2023-09-24 LAB — BASIC METABOLIC PANEL
Anion gap: 17 — ABNORMAL HIGH (ref 5–15)
BUN: 8 mg/dL (ref 6–20)
CO2: 22 mmol/L (ref 22–32)
Calcium: 9.1 mg/dL (ref 8.9–10.3)
Chloride: 100 mmol/L (ref 98–111)
Creatinine, Ser: 0.68 mg/dL (ref 0.44–1.00)
GFR, Estimated: >60 mL/min (ref >60)
Glucose, Bld: 68 mg/dL — ABNORMAL LOW (ref 70–99)
Potassium: 3.6 mmol/L (ref 3.5–5.1)
Sodium: 139 mmol/L (ref 135–145)

## 2023-09-24 LAB — LIPASE, BLOOD: Lipase: 26 U/L (ref 11–51)

## 2023-09-24 MED ORDER — KETOROLAC TROMETHAMINE 30 MG/ML IJ SOLN
15.0000 mg | Freq: Once | INTRAMUSCULAR | Status: AC
  Administered 2023-09-24: 15 mg via INTRAVENOUS
  Filled 2023-09-24: qty 1

## 2023-09-24 MED ORDER — HALOPERIDOL LACTATE 5 MG/ML IJ SOLN: 3.0000 mg | Freq: Once | INTRAMUSCULAR | Status: DC

## 2023-09-24 MED ORDER — SODIUM CHLORIDE 0.9 % IV SOLN
12.5000 mg | Freq: Four times a day (QID) | INTRAVENOUS | Status: DC | PRN
  Filled 2023-09-24: qty 12.5

## 2023-09-24 MED ORDER — LACTATED RINGERS IV BOLUS
1000.0000 mL | Freq: Once | INTRAVENOUS | Status: AC
  Administered 2023-09-24: 1000 mL via INTRAVENOUS

## 2023-09-24 MED ORDER — HALOPERIDOL LACTATE 5 MG/ML IJ SOLN
2.0000 mg | Freq: Once | INTRAMUSCULAR | Status: AC
  Administered 2023-09-24: 2 mg via INTRAVENOUS
  Filled 2023-09-24: qty 1

## 2023-09-24 MED ORDER — PROMETHAZINE HCL 25 MG PO TABS
25.0000 mg | ORAL_TABLET | Freq: Once | ORAL | Status: AC
  Administered 2023-09-24: 25 mg via ORAL
  Filled 2023-09-24: qty 1

## 2023-09-24 MED ORDER — PROMETHAZINE HCL 25 MG RE SUPP
25.0000 mg | Freq: Four times a day (QID) | RECTAL | 0 refills | Status: AC | PRN
Start: 2023-09-24 — End: ?

## 2023-09-24 MED ORDER — SODIUM CHLORIDE 0.9 % IV SOLN
12.5000 mg | Freq: Once | INTRAVENOUS | Status: AC
  Administered 2023-09-24: 12.5 mg via INTRAVENOUS
  Filled 2023-09-24: qty 0.5

## 2023-09-24 MED ORDER — PROMETHAZINE (PHENERGAN) 6.25MG IN NS 50ML IVPB: 6.2500 mg | Freq: Four times a day (QID) | INTRAVENOUS | Status: DC | PRN

## 2023-09-24 MED ORDER — PROMETHAZINE HCL 25 MG PO TABS
25.0000 mg | ORAL_TABLET | Freq: Once | ORAL | Status: DC
  Filled 2023-09-24: qty 1

## 2023-09-24 MED ORDER — LACTATED RINGERS IV BOLUS
1000.0000 mL | Freq: Once | INTRAVENOUS | Status: AC
Start: 2023-09-24 — End: 2023-09-24
  Administered 2023-09-24: 1000 mL via INTRAVENOUS

## 2023-09-24 MED ORDER — ONDANSETRON 8 MG PO TBDP: 8.0000 mg | ORAL_TABLET | Freq: Three times a day (TID) | ORAL | 0 refills | Status: AC | PRN

## 2023-09-24 MED ORDER — HALOPERIDOL LACTATE 5 MG/ML IJ SOLN
5.0000 mg | Freq: Once | INTRAMUSCULAR | Status: DC
  Filled 2023-09-24: qty 1

## 2023-09-24 MED ORDER — SODIUM CHLORIDE 0.9 % IV SOLN: 12.5000 mg | Freq: Four times a day (QID) | INTRAVENOUS | Status: DC | PRN

## 2023-09-24 MED ORDER — MORPHINE SULFATE (PF) 4 MG/ML IV SOLN
4.0000 mg | Freq: Once | INTRAVENOUS | Status: AC
  Administered 2023-09-24: 4 mg via INTRAVENOUS
  Filled 2023-09-24: qty 1

## 2023-09-24 NOTE — ED Triage Notes (Signed)
Patient reports abdominal pain, nausea, and vomiting x 6 days. Patient was recently admitted for same. Took prescribed home medications without relief.

## 2023-09-24 NOTE — ED Provider Notes (Addendum)
  Physical Exam  BP 120/87 (BP Location: Left Arm)   Pulse 85   Temp 98.9 F (37.2 C) (Oral)   Resp 16   Ht 5\' 6"  (1.676 m)   Wt 79.4 kg   LMP 08/18/2023   SpO2 99%   BMI 28.25 kg/m   Physical Exam  Procedures  Procedures  ED Course / MDM   Clinical Course as of 09/24/23 1554  Fri Sep 24, 2023  1341 CBC(!) CBC normal.  Urinalysis does show squamous cells positive nitrate but not definitive for urinary tract infection.  Metabolic panel shows bicarb decreased at 15 increased anion gap [JK]  1342 Ethanol Alcohol negative hCG negative [JK]  1410 Patient states she still having pain.  Will give an additional dose of pain meds [JK]  1410 X-rays did not show any signs of obstruction. [JK]  1410 Will repeat metabolic panel to see if her acidosis has improved [JK]    Clinical Course User Index [JK] Linwood Dibbles, MD   Medical Decision Making Amount and/or Complexity of Data Reviewed Labs: ordered. Decision-making details documented in ED Course. Radiology: ordered.  Risk Prescription drug management.   Pt comes in with cc of n/v with abd pain. Recent admission for the same. Hx of alcohol abuse, gastric bypass.  Labs shoe bicar of 15, elevated bili and ketonuria, positive anion gap. Recent CT was reassuring.  Pt to get hydration and po challenge. Will need repeat labs.  Received morphine and haldol.   Derwood Kaplan, MD 09/24/23 1556  6:42 PM Patient reassessed.  Her bicarb is corrected. Sugar 68.  Anion gap improved dramatically.  Patient still feels nauseous. On 1-28, she states that she left AMA. Reviewed endoscopy from November, 2024 which was indicative of esophagitis.  We will start p.o. challenge.  Patient is requesting that she get GI and surgery referral.   Reassessment at 830: Patient finally received her promethazine and has tolerated orals.  She continues to still feel nauseous.  No emesis, but just feels like she has to spit up.  Patient has  history of bariatric surgery.  She has required dilation in the past in [place] in 2017.  She did have upper endoscopy 06-29-2023 which was reassuring.  I have reviewed patient's records including GI and general surgery notes.  Per last note from general surgery, patient will benefit with outpatient follow-up by bariatric surgery and GI.  Will proceed with discharge, Zofran ODT and promethazine suppository prescribed.  Return precautions discussed.   Derwood Kaplan, MD 09/24/23 2118

## 2023-09-24 NOTE — ED Provider Notes (Addendum)
[place] [facility] AT [facility] Provider Note   CSN: [id number] Arrival date & time: 09/24/23  7829     History  Chief Complaint  Patient presents with   Emesis    Marie Myers is a 33 y.o. female.   Emesis    Patient presented to the ED for evaluation of abdominal pain nausea vomiting.  Patient has a history of recurrent episodes of intractable nausea and vomiting.  Patient was admitted to the hospital in November of last year for intractable nausea vomiting and most recently on January 25.  Patient ended up leaving AGAINST MEDICAL ADVICE on January 28.  While in the hospital she had a CT enterogram on January 26 that showed few distended patulous loops of small bowel in the mid left abdomen and anastomotic site without evidence of bowel obstruction.  Patient had a small hiatal hernia  Home Medications Prior to Admission medications   Medication Sig Start Date End Date Taking? Authorizing Provider  acetaminophen (TYLENOL) 325 MG tablet Take 2 tablets (650 mg total) by mouth every 6 (six) hours as needed for mild pain (pain score 1-3) or fever. 06/08/23   Standley Brooking, MD  ARIPiprazole (ABILIFY) 5 MG tablet Take 1 tablet (5 mg total) by mouth daily. 06/01/23   Shanna Cisco, NP  cholecalciferol (CHOLECALCIFEROL) 25 MCG tablet Take 1 tablet (1,000 Units total) by mouth daily. Patient not taking: Reported on 09/19/2023 07/01/23   Rai, Delene Ruffini, MD  folic acid (FOLVITE) 1 MG tablet Take 1 tablet (1 mg total) by mouth daily. 07/01/23   Rai, Delene Ruffini, MD  gabapentin (NEURONTIN) 300 MG capsule Take 1 capsule (300 mg total) by mouth 3 (three) times daily. 09/07/23 09/01/24  Windell Norfolk, MD  Oxcarbazepine (TRILEPTAL) 300 MG tablet Take 1 tablet (300 mg total) by mouth 2 (two) times daily. 09/07/23 09/01/24  Windell Norfolk, MD  oxyCODONE (OXY IR/ROXICODONE) 5 MG immediate release tablet Take 1 tablet (5 mg total) by mouth every 6 (six) hours as needed for  moderate pain (pain score 4-6) or severe pain (pain score 7-10). Patient not taking: Reported on 09/07/2023 06/30/23   Rai, Delene Ruffini, MD  pantoprazole (PROTONIX) 40 MG tablet Take 1 tablet (40 mg total) by mouth 2 (two) times daily before a meal. 06/30/23   Rai, Ripudeep K, MD  promethazine (PHENERGAN) 25 MG tablet Take 1 tablet (25 mg total) by mouth every 6 (six) hours as needed for nausea or vomiting. 06/30/23   Rai, Delene Ruffini, MD  sertraline (ZOLOFT) 100 MG tablet Take 100 mg by mouth in the morning and at bedtime. 08/25/22   [provider]  sucralfate (CARAFATE) 1 GM/10ML suspension Take 10 mLs (1 g total) by mouth 4 (four) times daily -  with meals and at bedtime. Can substitute to tablets if solution is not available. Patient not taking: Reported on 09/19/2023 06/30/23 07/30/23  Cathren Harsh, MD  traZODone (DESYREL) 100 MG tablet Take 2 tablets (200 mg total) by mouth at bedtime. 06/01/23   Shanna Cisco, NP      Allergies    Zithromax [azithromycin]    Review of Systems   Review of Systems  Gastrointestinal:  Positive for vomiting.    Physical Exam Updated Vital Signs BP 120/87 (BP Location: Left Arm)   Pulse 85   Temp 98.9 F (37.2 C) (Oral)   Resp 16   Ht 1.676 m (5\' 6" )   Wt 79.4 kg   LMP  08/18/2023   SpO2 99%   BMI 28.25 kg/m  Physical Exam Vitals and nursing note reviewed.  Constitutional:      Appearance: She is well-developed.  HENT:     Head: Normocephalic and atraumatic.     Right Ear: External ear normal.     Left Ear: External ear normal.  Eyes:     General: No scleral icterus.       Right eye: No discharge.        Left eye: No discharge.     Conjunctiva/sclera: Conjunctivae normal.  Neck:     Trachea: No tracheal deviation.  Cardiovascular:     Rate and Rhythm: Normal rate and regular rhythm.  Pulmonary:     Effort: Pulmonary effort is normal. No respiratory distress.     Breath sounds: Normal breath sounds. No stridor. No wheezing or  rales.  Abdominal:     General: Bowel sounds are normal. There is no distension.     Palpations: Abdomen is soft.     Tenderness: There is abdominal tenderness. There is no guarding or rebound.     Comments: Not vomiting but spitting her saliva and an emesis bag, generalized abdominal tenderness  Musculoskeletal:        General: No tenderness or deformity.     Cervical back: Neck supple.  Skin:    General: Skin is warm and dry.     Findings: No rash.  Neurological:     General: No focal deficit present.     Mental Status: She is alert.     Cranial Nerves: No cranial nerve deficit, dysarthria or facial asymmetry.     Sensory: No sensory deficit.     Motor: No abnormal muscle tone or seizure activity.     Coordination: Coordination normal.  Psychiatric:        Mood and Affect: Mood normal.     ED Results / Procedures / Treatments   Labs (all labs ordered are listed, but only abnormal results are displayed) Labs Reviewed  COMPREHENSIVE METABOLIC PANEL - Abnormal; Notable for the following components:      Result Value   Potassium 3.3 (*)    CO2 15 (*)    AST 59 (*)    ALT 56 (*)    Total Bilirubin 2.2 (*)    Anion gap 22 (*)    All other components within normal limits  CBC - Abnormal; Notable for the following components:   RBC 3.60 (*)    MCV 108.9 (*)    MCH 36.7 (*)    RDW 17.1 (*)    All other components within normal limits  URINALYSIS, ROUTINE W REFLEX MICROSCOPIC - Abnormal; Notable for the following components:   APPearance CLOUDY (*)    Bilirubin Urine SMALL (*)    Ketones, ur 80 (*)    Protein, ur 100 (*)    Nitrite POSITIVE (*)    Bacteria, UA FEW (*)    All other components within normal limits  RAPID URINE DRUG SCREEN, HOSP PERFORMED - Abnormal; Notable for the following components:   Benzodiazepines POSITIVE (*)    Tetrahydrocannabinol POSITIVE (*)    All other components within normal limits  LIPASE, BLOOD  HCG, QUANTITATIVE, PREGNANCY  ETHANOL   BASIC METABOLIC PANEL    EKG None  Radiology DG Abdomen Acute W/Chest Result Date: 09/24/2023 CLINICAL DATA:  Abdominal pain EXAM: DG ABDOMEN ACUTE WITH 1 VIEW CHEST COMPARISON:  09/19/2023 FINDINGS: There is no evidence of dilated bowel loops or  free intraperitoneal air. No radiopaque calculi or other significant radiographic abnormality is seen. Heart size and mediastinal contours are within normal limits. Both lungs are clear. Age advanced degenerative changes of both hips. IMPRESSION: Negative abdominal radiographs.  No acute cardiopulmonary disease. Electronically Signed   By: Duanne Guess D.O.   On: 09/24/2023 13:54    Procedures Procedures    Medications Ordered in ED Medications  promethazine (PHENERGAN) 12.5 mg in sodium chloride 0.9 % 50 mL IVPB (has no administration in time range)  lactated ringers bolus 1,000 mL (has no administration in time range)  lactated ringers bolus 1,000 mL (1,000 mLs Intravenous New Bag/Given 09/24/23 1250)  haloperidol lactate (HALDOL) injection 2 mg (2 mg Intravenous Given 09/24/23 1251)  ketorolac (TORADOL) 30 MG/ML injection 15 mg (15 mg Intravenous Given 09/24/23 1252)  morphine (PF) 4 MG/ML injection 4 mg (4 mg Intravenous Given 09/24/23 1443)    ED Course/ Medical Decision Making/ A&P Clinical Course as of 09/24/23 1546  Fri Sep 24, 2023  1341 CBC(!) CBC normal.  Urinalysis does show squamous cells positive nitrate but not definitive for urinary tract infection.  Metabolic panel shows bicarb decreased at 15 increased anion gap [JK]  1342 Ethanol Alcohol negative hCG negative [JK]  1410 Patient states she still having pain.  Will give an additional dose of pain meds [JK]  1410 X-rays did not show any signs of obstruction. [JK]  1410 Will repeat metabolic panel to see if her acidosis has improved [JK]    Clinical Course User Index [JK] Linwood Dibbles, MD                                 Medical Decision Making Problems  Addressed: Abdominal pain, unspecified abdominal location: acute illness or injury that poses a threat to life or bodily functions Vomiting without nausea, unspecified vomiting type: acute illness or injury that poses a threat to life or bodily functions  Amount and/or Complexity of Data Reviewed Labs: ordered. Decision-making details documented in ED Course. Radiology: ordered and independent interpretation performed.  Risk Prescription drug management.   Patient presented to the ER for evaluation of recurrent abdominal pain.  Patient was recently hospital for similar symptoms.  Per the discharge summary notes patient ended up leaving AMA on the 28th.  Patient is labs are notable for decreased bicarb with increased anion gap.  Patient also has slightly elevated bilirubin.  UDS is positive for benzos and THC.  Patient has been treated with antiemetics and IV fluids.  No vomiting here although she has been spitting up in the bag.  Suspect this could be recurrent issues.  Have ordered additional IV fluids and antiemetics.  Will plan on rechecking metabolic panel.  Have ordered an ultrasound.  Case turned over to Dr. Rhunette Croft.   Final Clinical Impression(s) / ED Diagnoses Final diagnoses:  Abdominal pain, unspecified abdominal location  Vomiting without nausea, unspecified vomiting type    Rx / DC Orders ED Discharge Orders     None          Linwood Dibbles, MD 09/24/23 1557

## 2023-09-24 NOTE — ED Notes (Signed)
Pt drank about 1/2 a cup of Gatorade and is feeling nauseous now

## 2023-09-24 NOTE — ED Notes (Signed)
Patient care taken, resting, gave gatorade to drink.

## 2023-09-24 NOTE — Discharge Instructions (Signed)
We saw YOU in the ER for nausea, vomiting.  Start with clear liquid diet for tomorrow. If you do well, then advance to soft diet.  Please call general surgery team and specifically request for bariatric surgeons for next follow-up.  Also we have provided you with phone number for GI team.  Please call them for a follow-up appointment as well.  Please return to the ER if your symptoms worsen; you have increased pain, fevers, chills, inability to keep any medications down, confusion. Otherwise see the outpatient doctor as requested.

## 2023-09-29 ENCOUNTER — Encounter (HOSPITAL_COMMUNITY): Payer: Self-pay

## 2023-09-29 ENCOUNTER — Emergency Department (HOSPITAL_COMMUNITY)
Admission: EM | Admit: 2023-09-29 | Discharge: 2023-09-29 | Disposition: A | Discharge status: Home / Self Care | Payer: Medicaid Other | Attending: Emergency Medicine | Admitting: Emergency Medicine

## 2023-09-29 ENCOUNTER — Other Ambulatory Visit: Payer: Self-pay

## 2023-09-29 ENCOUNTER — Emergency Department (HOSPITAL_COMMUNITY): Payer: Medicaid Other

## 2023-09-29 DIAGNOSIS — R197 Diarrhea, unspecified: Secondary | ICD-10-CM | POA: Insufficient documentation

## 2023-09-29 DIAGNOSIS — R1033 Periumbilical pain: Secondary | ICD-10-CM | POA: Diagnosis present

## 2023-09-29 DIAGNOSIS — R112 Nausea with vomiting, unspecified: Secondary | ICD-10-CM | POA: Insufficient documentation

## 2023-09-29 LAB — CBC WITH DIFFERENTIAL/PLATELET
Abs Immature Granulocytes: 0.05 10*3/uL (ref 0.00–0.07)
Basophils Absolute: 0.1 10*3/uL (ref 0.0–0.1)
Basophils Relative: 1 %
Eosinophils Absolute: 0 10*3/uL (ref 0.0–0.5)
Eosinophils Relative: 0 %
HCT: 41 % (ref 36.0–46.0)
Hemoglobin: 13.9 g/dL (ref 12.0–15.0)
Immature Granulocytes: 1 %
Lymphocytes Relative: 8 %
Lymphs Abs: 0.8 10*3/uL (ref 0.7–4.0)
MCH: 36.6 pg — ABNORMAL HIGH (ref 26.0–34.0)
MCHC: 33.9 g/dL (ref 30.0–36.0)
MCV: 107.9 fL — ABNORMAL HIGH (ref 80.0–100.0)
Monocytes Absolute: 0.9 10*3/uL (ref 0.1–1.0)
Monocytes Relative: 9 %
Neutro Abs: 7.7 10*3/uL (ref 1.7–7.7)
Neutrophils Relative %: 81 %
Platelets: 343 10*3/uL (ref 150–400)
RBC: 3.8 MIL/uL — ABNORMAL LOW (ref 3.87–5.11)
RDW: 15.9 % — ABNORMAL HIGH (ref 11.5–15.5)
WBC: 9.5 10*3/uL (ref 4.0–10.5)
nRBC: 0 % (ref 0.0–0.2)

## 2023-09-29 LAB — COMPREHENSIVE METABOLIC PANEL
ALT: 34 U/L (ref 0–44)
AST: 45 U/L — ABNORMAL HIGH (ref 15–41)
Albumin: 4.4 g/dL (ref 3.5–5.0)
Alkaline Phosphatase: 62 U/L (ref 38–126)
Anion gap: 13 (ref 5–15)
BUN: <5 mg/dL — ABNORMAL LOW (ref 6–20)
CO2: 22 mmol/L (ref 22–32)
Calcium: 8.6 mg/dL — ABNORMAL LOW (ref 8.9–10.3)
Chloride: 104 mmol/L (ref 98–111)
Creatinine, Ser: 0.5 mg/dL (ref 0.44–1.00)
GFR, Estimated: >60 mL/min (ref >60)
Glucose, Bld: 104 mg/dL — ABNORMAL HIGH (ref 70–99)
Potassium: 4.3 mmol/L (ref 3.5–5.1)
Sodium: 139 mmol/L (ref 135–145)
Total Bilirubin: 0.6 mg/dL (ref 0.0–1.2)
Total Protein: 7.9 g/dL (ref 6.5–8.1)

## 2023-09-29 LAB — URINALYSIS, ROUTINE W REFLEX MICROSCOPIC
Bilirubin Urine: NEGATIVE
Glucose, UA: NEGATIVE mg/dL
Hgb urine dipstick: NEGATIVE
Ketones, ur: NEGATIVE mg/dL
Leukocytes,Ua: NEGATIVE
Nitrite: NEGATIVE
Protein, ur: NEGATIVE mg/dL
Specific Gravity, Urine: 1.012 (ref 1.005–1.030)
pH: 5 (ref 5.0–8.0)

## 2023-09-29 LAB — LIPASE, BLOOD: Lipase: 25 U/L (ref 11–51)

## 2023-09-29 LAB — ETHANOL: Alcohol, Ethyl (B): 36 mg/dL — ABNORMAL HIGH (ref <10)

## 2023-09-29 LAB — HCG, SERUM, QUALITATIVE: Preg, Serum: NEGATIVE

## 2023-09-29 MED ORDER — SODIUM CHLORIDE 0.9 % IV BOLUS
1000.0000 mL | Freq: Once | INTRAVENOUS | Status: AC
  Administered 2023-09-29: 1000 mL via INTRAVENOUS

## 2023-09-29 MED ORDER — METOCLOPRAMIDE HCL 10 MG PO TABS: 10.0000 mg | ORAL_TABLET | Freq: Four times a day (QID) | ORAL | 0 refills | Status: AC | PRN

## 2023-09-29 MED ORDER — METOCLOPRAMIDE HCL 5 MG/ML IJ SOLN
10.0000 mg | Freq: Once | INTRAMUSCULAR | Status: AC
  Administered 2023-09-29: 10 mg via INTRAVENOUS
  Filled 2023-09-29: qty 2

## 2023-09-29 MED ORDER — HALOPERIDOL LACTATE 5 MG/ML IJ SOLN: 5.0000 mg | Freq: Once | INTRAMUSCULAR | Status: DC

## 2023-09-29 MED ORDER — HALOPERIDOL LACTATE 5 MG/ML IJ SOLN
2.5000 mg | Freq: Once | INTRAMUSCULAR | Status: AC
  Administered 2023-09-29: 2.5 mg via INTRAVENOUS
  Filled 2023-09-29: qty 1

## 2023-09-29 MED ORDER — HYDROMORPHONE HCL 1 MG/ML IJ SOLN
1.0000 mg | Freq: Once | INTRAMUSCULAR | Status: AC
  Administered 2023-09-29: 1 mg via INTRAVENOUS
  Filled 2023-09-29: qty 1

## 2023-09-29 NOTE — ED Triage Notes (Addendum)
 Patient is here for evaluation of nausea, vomiting and abdominal pain. Reports that she was here a few days ago for the same but has not been able to stop vomiting. During triage patient endorsed that she has suicidal thoughts but denies any intent.

## 2023-09-29 NOTE — ED Provider Notes (Addendum)
 [place] EM[facility]GENCY DEPARTMENT AT [facility] Provider Note   CSN: [id number] Arrival date & time: 09/29/23  9255     History  Chief Complaint  Patient presents with   Abdominal Pain   Emesis    Marie Myers is a 33 y.o. female.  HPI 33 year old female with a history of chronic abdominal pain, prior gastric bypass presents with vomiting and abdominal pain.  She was admitted about a week ago and left AMA.  She was seen in the ED on 1/31 and was discharged after workup and treatment.  She states she had a couple days where she was doing well but then yesterday developed recurrent vomiting and has been vomiting all night.  She is having periumbilical and right-sided abdominal pain which is pretty typical for her though she feels like it stronger currently.  No hematemesis.  She is having some diarrhea.  No fevers but she gets chills when she has the vomiting.  No urinary symptoms or chest pain.  This seems to be typical of her on and off flares of her abdominal pain and vomiting.  She has tried Zofran  and Phenergan  suppositories without relief.  Home Medications Prior to Admission medications   Medication Sig Start Date End Date Taking? Authorizing Provider  metoCLOPramide  (REGLAN ) 10 MG tablet Take 1 tablet (10 mg total) by mouth every 6 (six) hours as needed for nausea. 09/29/23  Yes Freddi Hamilton, MD  acetaminophen  (TYLENOL ) 325 MG tablet Take 2 tablets (650 mg total) by mouth every 6 (six) hours as needed for mild pain (pain score 1-3) or fever. 06/08/23   Jadine Toribio SQUIBB, MD  ARIPiprazole  (ABILIFY ) 5 MG tablet Take 1 tablet (5 mg total) by mouth daily. 06/01/23   Harl Zane BRAVO, NP  cholecalciferol  (CHOLECALCIFEROL ) 25 MCG tablet Take 1 tablet (1,000 Units total) by mouth daily. Patient not taking: Reported on 09/19/2023 07/01/23   Rai, Nydia POUR, MD  folic acid  (FOLVITE ) 1 MG tablet Take 1 tablet (1 mg total) by mouth daily. 07/01/23   Rai, Nydia POUR, MD  gabapentin   (NEURONTIN ) 300 MG capsule Take 1 capsule (300 mg total) by mouth 3 (three) times daily. 09/07/23 09/01/24  Camara, Amadou, MD  ondansetron  (ZOFRAN -ODT) 8 MG disintegrating tablet Take 1 tablet (8 mg total) by mouth every 8 (eight) hours as needed for nausea. 09/24/23   Charlyn Sora, MD  Oxcarbazepine  (TRILEPTAL ) 300 MG tablet Take 1 tablet (300 mg total) by mouth 2 (two) times daily. 09/07/23 09/01/24  Camara, Amadou, MD  oxyCODONE  (OXY IR/ROXICODONE ) 5 MG immediate release tablet Take 1 tablet (5 mg total) by mouth every 6 (six) hours as needed for moderate pain (pain score 4-6) or severe pain (pain score 7-10). Patient not taking: Reported on 09/07/2023 06/30/23   Davia Nydia POUR, MD  pantoprazole  (PROTONIX ) 40 MG tablet Take 1 tablet (40 mg total) by mouth 2 (two) times daily before a meal. 06/30/23   Rai, Ripudeep K, MD  promethazine  (PHENERGAN ) 25 MG suppository Place 1 suppository (25 mg total) rectally every 6 (six) hours as needed for nausea. 09/24/23   Charlyn Sora, MD  sertraline  (ZOLOFT ) 100 MG tablet Take 100 mg by mouth in the morning and at bedtime. 08/25/22   [provider]  sucralfate  (CARAFATE ) 1 GM/10ML suspension Take 10 mLs (1 g total) by mouth 4 (four) times daily -  with meals and at bedtime. Can substitute to tablets if solution is not available. Patient not taking: Reported on 09/19/2023 06/30/23 07/30/23  Rai, Ripudeep K, MD  traZODone  (DESYREL ) 100 MG tablet Take 2 tablets (200 mg total) by mouth at bedtime. 06/01/23   Harl Zane BRAVO, NP      Allergies    Zithromax [azithromycin]    Review of Systems   Review of Systems  Constitutional:  Negative for fever.  Cardiovascular:  Negative for chest pain.  Gastrointestinal:  Positive for abdominal pain, diarrhea, nausea and vomiting.  Genitourinary:  Negative for dysuria.    Physical Exam Updated Vital Signs BP (!) 158/99 (BP Location: Left Arm)   Pulse 88   Temp 98.7 F (37.1 C) (Oral)   Resp 16   Ht 5' 6  (1.676 m)   Wt 79.4 kg   LMP 08/18/2023   SpO2 100%   BMI 28.25 kg/m  Physical Exam Vitals and nursing note reviewed.  Constitutional:      Appearance: She is well-developed. She is not diaphoretic.  HENT:     Head: Normocephalic and atraumatic.  Cardiovascular:     Rate and Rhythm: Normal rate and regular rhythm.     Heart sounds: Normal heart sounds.  Pulmonary:     Effort: Pulmonary effort is normal.     Breath sounds: Normal breath sounds.  Abdominal:     Palpations: Abdomen is soft.     Tenderness: There is generalized abdominal tenderness.  Skin:    General: Skin is warm and dry.  Neurological:     Mental Status: She is alert.     ED Results / Procedures / Treatments   Labs (all labs ordered are listed, but only abnormal results are displayed) Labs Reviewed  COMPREHENSIVE METABOLIC PANEL - Abnormal; Notable for the following components:      Result Value   Glucose, Bld 104 (*)    BUN <5 (*)    Calcium 8.6 (*)    AST 45 (*)    All other components within normal limits  CBC WITH DIFFERENTIAL/PLATELET - Abnormal; Notable for the following components:   RBC 3.80 (*)    MCV 107.9 (*)    MCH 36.6 (*)    RDW 15.9 (*)    All other components within normal limits  URINALYSIS, ROUTINE W REFLEX MICROSCOPIC - Abnormal; Notable for the following components:   APPearance HAZY (*)    All other components within normal limits  ETHANOL - Abnormal; Notable for the following components:   Alcohol, Ethyl (B) 36 (*)    All other components within normal limits  LIPASE, BLOOD  HCG, SERUM, QUALITATIVE    EKG EKG Interpretation Date/Time:  Wednesday September 29 2023 09:06:21 EST Ventricular Rate:  81 PR Interval:  108 QRS Duration:  77 QT Interval:  396 QTC Calculation: 460 R Axis:   24  Text Interpretation: Sinus rhythm Short PR interval Borderline T abnormalities, diffuse leads Confirmed by Freddi Hamilton [phone]) on 09/29/2023 9:58:35 AM  Radiology DG ABD ACUTE 2+V  W 1V CHEST Result Date: 09/29/2023 CLINICAL DATA:  Vomiting. EXAM: DG ABDOMEN ACUTE WITH 1 VIEW CHEST COMPARISON:  09/24/2023 FINDINGS: Both lungs are clear. Heart size is normal. No evidence for free air. Postsurgical bowel changes in the left abdomen. Mildly dilated loops of bowel along the right side of the abdomen. Surgical screw in the proximal left femur. Multiple pelvic calcifications are suggestive for phleboliths. IMPRESSION: 1. Mildly dilated loops of bowel are nonspecific. Overall, this appears to be a nonobstructive bowel gas pattern. 2. No acute cardiopulmonary disease. Electronically Signed   By: Juliene Balder  M.D.   On: 09/29/2023 09:11    Procedures Procedures    Medications Ordered in ED Medications  sodium chloride  0.9 % bolus 1,000 mL (0 mLs Intravenous Stopped 09/29/23 1113)  HYDROmorphone  (DILAUDID ) injection 1 mg (1 mg Intravenous Given 09/29/23 0944)  metoCLOPramide  (REGLAN ) injection 10 mg (10 mg Intravenous Given 09/29/23 0944)  haloperidol  lactate (HALDOL ) injection 2.5 mg (2.5 mg Intravenous Given 09/29/23 1046)    ED Course/ Medical Decision Making/ A&P                                 Medical Decision Making Amount and/or Complexity of Data Reviewed Labs: ordered.    Details: No ketones in the urine.  Normal WBC Radiology: ordered and independent interpretation performed.    Details: No bowel obstruction ECG/medicine tests: ordered and independent interpretation performed.    Details: No ischemia  Risk Prescription drug management.   Patient was given treatment and fluids and ultimately is able to tolerate p.o. fluids.  She has diffuse abdominal tenderness but this appears to be an acute exacerbation of her chronic pain and vomiting.  She had a negative CT 10 days ago.  I do not think a repeat is needed given this appears to be a recurrent issue.  Labs are overall reassuring.  Given she is tolerating p.o., I think she is stable for discharge and will prescribe Reglan   to go along with her other meds at home.  Will discharge home with return precautions.   Addendum: I reviewed nurse notes after discharge which indicated some possible passive Suicidal thoughts. Patient did not bring this up to me, and thus was not assessed on this visit, though it seems she has not had any suicidal intent and this is likely a low risk presentation.     Final Clinical Impression(s) / ED Diagnoses Final diagnoses:  Nausea and vomiting, unspecified vomiting type    Rx / DC Orders ED Discharge Orders          Ordered    metoCLOPramide  (REGLAN ) 10 MG tablet  Every 6 hours PRN        09/29/23 1205              Freddi Hamilton, MD 09/29/23 1221    Freddi Hamilton, MD 09/29/23 1327

## 2023-09-29 NOTE — Discharge Instructions (Signed)
 If you develop worsening, continued, or recurrent abdominal pain, uncontrolled vomiting, fever, chest or back pain, or any other new/concerning symptoms then return to the ER for evaluation.

## 2023-09-29 NOTE — ED Notes (Signed)
 Was able to draw blood for lavender tube. Unable to obtain other blood or IV.  Consult placed for IV team.

## 2023-10-08 ENCOUNTER — Encounter (HOSPITAL_COMMUNITY): Payer: Self-pay | Admitting: Psychiatry

## 2023-10-08 ENCOUNTER — Telehealth (HOSPITAL_COMMUNITY): Payer: Medicaid Other | Admitting: Psychiatry

## 2023-10-08 DIAGNOSIS — F1094 Alcohol use, unspecified with alcohol-induced mood disorder: Secondary | ICD-10-CM

## 2023-10-08 DIAGNOSIS — F411 Generalized anxiety disorder: Secondary | ICD-10-CM | POA: Diagnosis not present

## 2023-10-08 MED ORDER — MIRTAZAPINE 15 MG PO TABS
15.0000 mg | ORAL_TABLET | Freq: Every day | ORAL | 3 refills | Status: DC
Start: 2023-10-08 — End: 2023-12-09

## 2023-10-08 MED ORDER — HYDROXYZINE HCL 10 MG PO TABS: 10.0000 mg | ORAL_TABLET | Freq: Three times a day (TID) | ORAL | 3 refills | Status: DC | PRN

## 2023-10-08 MED ORDER — ARIPIPRAZOLE 10 MG PO TABS: 10.0000 mg | ORAL_TABLET | Freq: Every day | ORAL | 3 refills | Status: DC

## 2023-10-08 NOTE — Progress Notes (Signed)
BH MD/PA/NP OP Progress Note Virtual Visit via Video Note  I connected with Marie Myers on 10/08/23 at  8:30 AM EST by a video enabled telemedicine application and verified that I am speaking with the correct person using two identifiers.  Location: Patient: Home Provider: Clinic   I discussed the limitations of evaluation and management by telemedicine and the availability of in person appointments. The patient expressed understanding and agreed to proceed.  I provided 30 minutes of non-face-to-face time during this encounter.   10/08/2023 9:32 AM Marie Myers  MRN:  [id number]  Chief Complaint: "My medications are not working."  HPI: 33 year old female seen today for follow-up psychiatric evaluation.  She has a psychiatric history of depression, alcohol use disorder, anxiety, insomnia, and marijuana use.  Currently she is managed on Abilify 5, trazodone 200 mg nightly as needed. Patient also takes gabapentin 300 mg three times daily (prescribed by her neurologist) and Trileptal 300 mg twice daily (prescribed by her neurologist). She notes her medications are not effective in managing her psychiatric conditions.   Today she is well-groomed, pleasant, cooperative, and engaged in conversation.  Patient informed Clinical research associate that her medications are not working.  She reports that she is not sleeping with trazodone and reports that she drinks more when she cannot sleep. Provider conducted an Audit assessment and patient scored a 18, at her last visit she 26.  Provider informed patient that alcohol can exacerbate her mental health illnesses.  Provider discussed patient recent lab values including her elevated ALT and AST.  At patient's last visit provider recommended trialing naltrexone pending negative UDS.  Patient notes that she believes that she is ready to try this medication and reports that she will get a urine drug screen done next week Tuesday or Wednesday.  Patient also notes that she has been  having GI issues. She had gastric bypass in the past and reports that she often is nauseous and have stomach pain. Provider informed patient that alcohol can worsen her GI issues.  Since her last visit she informed writer that she is irritable, distracted, has racing thoughts, and fluctuations in mood.  She reports that she often snaps on individuals and is angry for no reason.  Today provider conducted a GAD-7 and patient scored an 18, at her last visit she scored a 20.  Provider also conducted PHQ-9 and patient scored a 21, at her last visit she scored a 25.  Today she endorses passive SI but denies wanting to harm herself.  SI but denies wanting to harm herself.  She denies SI/HI or paranoia. At times she has auditory and visual hallucinations.    Patient notes that she smokes marijuana frequently. Provider informed patient that marijuana and alcohol can exacerbate her mental health.  She endorsed understanding however disagreed.  Today she is agreeable to increasing Abilify 5 mg to 10 mg to help manage mood.  Trazodone  200 mg nightly discontinued. She will start hydroxyzine 10 mg three times daily to help manage anxiety. She will also start Mirtazapine 15 mg nightly to to help manage sleep, anxiety, and depression.  Patient informed that she could be tried on naltrexone 50 mg pending a UDS.  Provider also ordered vitamin D, vitamin B12, thyroid panel, lipid panel, and prolactin level.  She will continue all other medications as prescribed.  She will follow up with [facility] for substance use therapy. Visit Diagnosis:    ICD-10-CM   1. 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 Urine Drug Panel 7    ARIPiprazole (ABILIFY) 10 MG tablet    mirtazapine (REMERON) 15 MG tablet    Vitamin D (25 hydroxy)    Vitamin B1    Thyroid Panel With TSH    Lipid Profile    Prolactin    Prolactin     Lipid Profile    Thyroid Panel With TSH    Vitamin B1    Vitamin D (25 hydroxy)    Urine Drug Panel 7       Past Psychiatric History:  depression, alcohol use disorder, anxiety, insomnia, and marijuana use  Past Medical History:  Past Medical History:  Diagnosis Date   Anxiety    Depression    H/O gastric bypass    Insomnia    Marijuana use 01/08/2021   Moderate alcohol use disorder (HCC) 01/08/2021    Past Surgical History:  Procedure Laterality Date   CHOLECYSTECTOMY     ESOPHAGOGASTRODUODENOSCOPY (EGD) WITH PROPOFOL N/A 06/29/2023   Procedure: ESOPHAGOGASTRODUODENOSCOPY (EGD) WITH PROPOFOL;  Surgeon: Vida Rigger, MD;  Location: [facility];  Service: Gastroenterology;  Laterality: N/A;  Possible balloon dilation   GASTRIC BYPASS     GASTRIC BYPASS     LAPAROSCOPY N/A 08/11/2019   Procedure: LAPAROSCOPY DIAGNOSTIC;  Surgeon: Harriette Bouillon, MD;  Location: [facility];  Service: General;  Laterality: N/A;    Family Psychiatric History: Son SI and ADHD  Family History:  Family History  Problem Relation Age of Onset   Diabetes Mother    Diabetes Father     Social History:  Social History   Socioeconomic History   Marital status: Single    Spouse name: Not on file   Number of children: Not on file   Years of education: Not on file   Highest education level: Not on file  Occupational History   Not on file  Tobacco Use   Smoking status: Never   Smokeless tobacco: Never  Vaping Use   Vaping status: Every Day   Substances: Nicotine, Flavoring  Substance and Sexual Activity   Alcohol use: Yes    Alcohol/week: 1.0 standard drink of alcohol    Types: 1 Glasses of wine per week   Drug use: Yes    Types: Marijuana   Sexual activity: Not on file  Other Topics Concern   Not on file  Social History Narrative   Not on file   Social Drivers of Health   Financial Resource Strain: Not on file  Food Insecurity: No Food Insecurity (09/19/2023)   Hunger Vital Sign     Worried About Running Out of Food in the Last Year: Never true    Ran Out of Food in the Last Year: Never true  Transportation Needs: No Transportation Needs (09/19/2023)   PRAPARE - Administrator, Civil Service (Medical): No    Lack of Transportation (Non-Medical): No  Physical Activity: Not on file  Stress: Not on file  Social Connections: Unknown (11/19/2022)   Received from [facility]   Social Network    Social Network: Not on file    Allergies:  Allergies  Allergen Reactions   Zithromax [Azithromycin] Hives    Metabolic Disorder Labs: Lab Results  Component Value Date   HGBA1C 4.5 (L) 01/12/2022   MPG 82.45 01/12/2022   MPG 88.19 01/08/2021   No results found for: "PROLACTIN" Lab Results  Component Value Date   CHOL 177 01/12/2022   TRIG 42 01/12/2022   HDL 119 01/12/2022   CHOLHDL 1.5 01/12/2022   VLDL 8 01/12/2022   LDLCALC 50 01/12/2022   LDLCALC 38 01/08/2021   Lab Results  Component Value Date   TSH 2.288 06/07/2023   TSH 2.338 01/12/2022    Therapeutic Level Labs: No results found for: "LITHIUM" No results found for: "VALPROATE" No results found for: "CBMZ"  Current Medications: Current Outpatient Medications  Medication Sig Dispense Refill   hydrOXYzine (ATARAX) 10 MG tablet Take 1 tablet (10 mg total) by mouth 3 (three) times daily as needed. 90 tablet 3   mirtazapine (REMERON) 15 MG tablet Take 1 tablet (15 mg total) by mouth at bedtime. 30 tablet 3   acetaminophen (TYLENOL) 325 MG tablet Take 2 tablets (650 mg total) by mouth every 6 (six) hours as needed for mild pain (pain score 1-3) or fever.     ARIPiprazole (ABILIFY) 10 MG tablet Take 1 tablet (10 mg total) by mouth daily. 30 tablet 3   cholecalciferol (CHOLECALCIFEROL) 25 MCG tablet Take 1 tablet (1,000 Units total) by mouth daily. (Patient not taking: Reported on 09/19/2023) 30 tablet 3   folic acid (FOLVITE) 1 MG tablet Take 1 tablet (1 mg total) by mouth  daily. 30 tablet 3   gabapentin (NEURONTIN) 300 MG capsule Take 1 capsule (300 mg total) by mouth 3 (three) times daily. 270 capsule 3   metoCLOPramide (REGLAN) 10 MG tablet Take 1 tablet (10 mg total) by mouth every 6 (six) hours as needed for nausea. 30 tablet 0   ondansetron (ZOFRAN-ODT) 8 MG disintegrating tablet Take 1 tablet (8 mg total) by mouth every 8 (eight) hours as needed for nausea. 20 tablet 0   Oxcarbazepine (TRILEPTAL) 300 MG tablet Take 1 tablet (300 mg total) by mouth 2 (two) times daily. 180 tablet 3   oxyCODONE (OXY IR/ROXICODONE) 5 MG immediate release tablet Take 1 tablet (5 mg total) by mouth every 6 (six) hours as needed for moderate pain (pain score 4-6) or severe pain (pain score 7-10). (Patient not taking: Reported on 09/07/2023) 20 tablet 0   pantoprazole (PROTONIX) 40 MG tablet Take 1 tablet (40 mg total) by mouth 2 (two) times daily before a meal. 60 tablet 1   promethazine (PHENERGAN) 25 MG suppository Place 1 suppository (25 mg total) rectally every 6 (six) hours as needed for nausea. 12 each 0   sucralfate (CARAFATE) 1 GM/10ML suspension Take 10 mLs (1 g total) by mouth 4 (four) times daily -  with meals and at bedtime. Can substitute to tablets if solution is not available. (Patient not taking: Reported on 09/19/2023) 1200 mL 0   No current facility-administered medications for this visit.     Musculoskeletal: Strength & Muscle Tone: within normal limits Gait & Station: normal Patient leans: N/A  Psychiatric Specialty Exam: Review of Systems  Last menstrual period 08/18/2023.There is no height or weight on file to calculate BMI.  General Appearance: Well Groomed  Eye Contact:  Good  Speech:  Clear and Coherent and Normal Rate  Volume:  Normal  Mood:  Anxious and Depressed  Affect:  Appropriate  Thought Process:  Coherent, Irrelevant, and NA  Orientation:  Full (Time, Place, and Person)  Thought Content: Logical and Hallucinations: Auditory Visual    Suicidal Thoughts:  No  Homicidal Thoughts:  No  Memory:  Immediate;   Good Recent;   Fair Remote;   Fair  Judgement:  Fair  Insight:  Fair  Psychomotor Activity:  Normal  Concentration:  Concentration: Good and Attention Span: Good  Recall:  Good  Fund of Knowledge: Good  Language: Good  Akathisia:  No  Handed:  Right  AIMS (if indicated): not done  Assets:  Communication Skills Desire for Improvement Housing Leisure Time Physical Health Social Support Talents/Skills Vocational/Educational  ADL's:  Intact  Cognition: WNL  Sleep:  Poor   Screenings: AIMS    Flowsheet Row [facility]  AIMS Total Score 0 0      AUDIT    Flowsheet Row Video Visit from 10/08/2023 in [facility] Clinical Support from 06/01/2023 in [facility] Video Visit from 05/28/2022 in [facility] Office Visit from 02/20/2022 in [facility] (Dis[facility]  Alcohol Use Disorder Identification Test Final Score (AUDIT) 18 26 18 17 8       GAD-7    Flowsheet Row Video Visit from 10/08/2023 in [facility] Counselor from 06/22/2023 in [facility] Clinical Support from 06/01/2023 in [facility] Video Visit from 05/28/2022 in [facility] Office Visit from 02/20/2022 in [facility]  Total GAD-7 Score 18 17 20 11 16       PHQ2-9    Flowsheet Row Video Visit from 10/08/2023 in [facility] Counselor from 06/22/2023 in [facility] Clinical Support from 06/01/2023 in [facility] Video Visit from 05/28/2022 in [facility] Office Visit from 02/20/2022 in [facility]  PHQ-2 Total Score 6 6 6 5 5   PHQ-9 Total Score 21 25 25 18 15       Flowsheet Row Video Visit from 10/08/2023 in [facility] ED from 09/29/2023 in [facility] Emergency Department at [facility] ED from 09/24/2023 in [facility] Emergency Department at [facility]  C-SSRS RISK CATEGORY Error: Q7 should not be populated when Q6 is No Moderate Risk No Risk        Assessment and Plan: Patient endorses alcohol use, marijuana use, anxiety, depression, and poor sleep.  Today she is agreeable to increasing Abilify 5 mg to 10 mg to help manage mood.  Trazodone  200 mg nightly discontinued. She will start hydroxyzine 10 mg three times daily to help manage anxiety. She will also start Mirtazapine 15 mg nightly to to help manage sleep, anxiety, and depression.  Patient informed that she could be tried on naltrexone 50 mg pending a UDS.  Provider also ordered vitamin D, vitamin B12, thyroid panel, lipid panel, and prolactin level.  She will continue all other medications as prescribed.  She will follow up with [facility] for substance use therapy.  1. Alcohol-induced mood disorder (HCC)  - Urine Drug Panel 7; Future Increased- ARIPiprazole (ABILIFY) 10 MG tablet; Take 1 tablet (10 mg total) by mouth daily.  Dispense: 30 tablet; Refill: 3 Start- mirtazapine (REMERON) 15 MG tablet; Take 1 tablet (15 mg total) by mouth at bedtime.  Dispense: 30 tablet; Refill: 3 - Vitamin D (25 hydroxy); Future - Vitamin B1; Future - Thyroid Panel With TSH; Future - Lipid Profile; Future - Prolactin; Future - Prolactin - Lipid  Profile - Thyroid Panel With TSH - Vitamin B1 - Vitamin D (25 hydroxy) - Urine Drug Panel 7  2. Generalized anxiety disorder (Primary)  Start- mirtazapine (REMERON) 15 MG tablet;  Take 1 tablet (15 mg total) by mouth at bedtime.  Dispense: 30 tablet; Refill: 3 Start- hydrOXYzine (ATARAX) 10 MG tablet; Take 1 tablet (10 mg total) by mouth 3 (three) times daily as needed.  Dispense: 90 tablet; Refill: 3   Collaboration of Care: Collaboration of Care: Other provider involved in patient's care AEB Counselor and PCP  Patient/Guardian was advised Release of Information must be obtained prior to any record release in order to collaborate their care with an outside provider. Patient/Guardian was advised if they have not already done so to contact the registration department to sign all necessary forms in order for [place] to release information regarding their care.   Consent: Patient/Guardian gives verbal consent for treatment and assignment of benefits for services provided during this visit. Patient/Guardian expressed understanding and agreed to proceed.    Shanna Cisco, NP 10/08/2023, 9:32 AM

## 2023-10-26 ENCOUNTER — Telehealth (HOSPITAL_COMMUNITY): Payer: Self-pay | Admitting: Psychiatry

## 2023-10-28 ENCOUNTER — Other Ambulatory Visit (HOSPITAL_COMMUNITY): Payer: Self-pay | Admitting: Psychiatry

## 2023-10-28 DIAGNOSIS — F1094 Alcohol use, unspecified with alcohol-induced mood disorder: Secondary | ICD-10-CM

## 2023-10-28 DIAGNOSIS — F411 Generalized anxiety disorder: Secondary | ICD-10-CM

## 2023-10-28 NOTE — Telephone Encounter (Signed)
 Patient reports that her lab be sent to [facility].  She notes that she is unable to go to [facility] because she owes them.  Provider reordered labs and sent them to [facility].

## 2023-12-06 ENCOUNTER — Telehealth: Payer: Self-pay | Admitting: Neurology

## 2023-12-06 NOTE — Telephone Encounter (Signed)
 Pt said GYN advised to change medication due to finding out patient is pregnant. Would like a call from the nurse to discuss an earlier appt as soon as possible

## 2023-12-07 ENCOUNTER — Other Ambulatory Visit: Payer: Self-pay | Admitting: Neurology

## 2023-12-07 DIAGNOSIS — R569 Unspecified convulsions: Secondary | ICD-10-CM

## 2023-12-07 NOTE — Telephone Encounter (Signed)
 No need to change the medication. How far is she? Oxcarbazepine is safe during pregnancy. Please have the patient stop by this week to obtain lab work and add her to the schedule for 4/25 at 845. Thanks

## 2023-12-09 ENCOUNTER — Encounter (HOSPITAL_COMMUNITY): Payer: Self-pay | Admitting: Psychiatry

## 2023-12-09 ENCOUNTER — Telehealth (HOSPITAL_COMMUNITY): Admitting: Psychiatry

## 2023-12-09 DIAGNOSIS — F1094 Alcohol use, unspecified with alcohol-induced mood disorder: Secondary | ICD-10-CM

## 2023-12-09 DIAGNOSIS — Z331 Pregnant state, incidental: Secondary | ICD-10-CM | POA: Diagnosis not present

## 2023-12-09 DIAGNOSIS — F411 Generalized anxiety disorder: Secondary | ICD-10-CM

## 2023-12-09 MED ORDER — ARIPIPRAZOLE 10 MG PO TABS: 10.0000 mg | ORAL_TABLET | Freq: Every day | ORAL | 3 refills | Status: DC

## 2023-12-09 MED ORDER — MIRTAZAPINE 30 MG PO TABS: 30.0000 mg | ORAL_TABLET | Freq: Every day | ORAL | 3 refills | Status: DC

## 2023-12-09 NOTE — Progress Notes (Signed)
 BH MD/PA/NP OP Progress Note Virtual Visit via Video Note  I connected with Floris Neuhaus on 12/09/23 at  2:00 PM EDT by a video enabled telemedicine application and verified that I am speaking with the correct person using two identifiers.  Location: Patient: Home Provider: Clinic   I discussed the limitations of evaluation and management by telemedicine and the availability of in person appointments. The patient expressed understanding and agreed to proceed.  I provided 30 minutes of non-face-to-face time during this encounter.   12/09/2023 2:54 PM Zillah Alexie  MRN:  [id number]  Chief Complaint: "I am [redacted] weeks pregnant."  HPI: 33 year old female seen today for follow-up psychiatric evaluation.  She has a psychiatric history of depression, alcohol use disorder, anxiety, insomnia, and marijuana use.  Currently she is managed on Abilify 10 and Mirtazapine 15 mg nightly. Patient also takes gabapentin 300 mg three times daily (prescribed by her neurologist) and Trileptal 300 mg twice daily (prescribed by her neurologist). She notes her medications are not effective in managing her psychiatric conditions.   Today she is well-groomed, pleasant, cooperative, and engaged in conversation.  Patient informed Clinical research associate that she is [redacted] weeks pregnant. Provider discussed risk and benefits of being on antidepressant and antipsychotics while pregnant. Patient endorsed understanding and requested to continue Abilify at its current dose and increase Mirtazapine. She notes that she is only sleeping 2-3 hours nightly.   Patient notes that she is uncertain if her medications are working but notes that her anxiety and depression are well managed. Today provider conducted a GAD 7 and patient scored an 8, at her last visit she scored an 18.Provider also conducted a PHQ 9 and patient scored a 6, at her last visit she scored a 21. She notes that she has increased appetite and has gained 12 pounds since her last visit.  Today she denies SI/HI/VAH mania, or paranoia.  Patient notes that since her last visit and finding out she was pregnant she has stopped drinking alcohol. Provider informed patient that alcohol is a depressant and informed her that her GAD 7 and PHQ 9 has improved since discontinuing alcohol. She does note that she continues to smoke marijuana occasionally. Provider asked patient if she wanted to be referred to [facility] as they have the [facility] maternity clinic for mothers suffering from substance use. She notes that she now lives in [place] and prefers not to travel to [place]. Provider informed patient that [facility] generally services [place]. Patient given resources to [employer] and [facility]  [place].   Today hydroxyzine discontinued. Patient Mirtazapine 15 mg was increased to 30 mg to help manage sleep. Patient was given a follow up visit with writer but will cancel if is is able to get in with  [facility] or [facility] in [place]. No other concerns noted at this time.   Visit Diagnosis:    ICD-10-CM   1. 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 Generalized anxiety disorder  F41.1 mirtazapine (REMERON) 30 MG tablet        Past Psychiatric History:  depression, alcohol use disorder, anxiety, insomnia, and marijuana use  Past Medical History:  Past Medical History:  Diagnosis Date   Anxiety    Depression    H/O gastric bypass    Insomnia    Marijuana use 01/08/2021   Moderate alcohol use disorder (HCC) 01/08/2021  Past Surgical History:  Procedure Laterality Date   CHOLECYSTECTOMY     ESOPHAGOGASTRODUODENOSCOPY (EGD) WITH PROPOFOL N/A 06/29/2023   Procedure: ESOPHAGOGASTRODUODENOSCOPY (EGD) WITH PROPOFOL;  Surgeon: Ozell Blunt, MD;  Location: [facility];  Service: Gastroenterology;  Laterality: N/A;  Possible balloon  dilation   GASTRIC BYPASS     GASTRIC BYPASS     LAPAROSCOPY N/A 08/11/2019   Procedure: LAPAROSCOPY DIAGNOSTIC;  Surgeon: Sim Dryer, MD;  Location: [facility] OR;  Service: General;  Laterality: N/A;    Family Psychiatric History: Son SI and ADHD  Family History:  Family History  Problem Relation Age of Onset   Diabetes Mother    Diabetes Father     Social History:  Social History   Socioeconomic History   Marital status: Single    Spouse name: Not on file   Number of children: Not on file   Years of education: Not on file   Highest education level: Not on file  Occupational History   Not on file  Tobacco Use   Smoking status: Never   Smokeless tobacco: Never  Vaping Use   Vaping status: Every Day   Substances: Nicotine, Flavoring  Substance and Sexual Activity   Alcohol use: Yes    Alcohol/week: 1.0 standard drink of alcohol    Types: 1 Glasses of wine per week   Drug use: Yes    Types: Marijuana   Sexual activity: Not on file  Other Topics Concern   Not on file  Social History Narrative   Not on file   Social Drivers of Health   Financial Resource Strain: Low Risk  (11/30/2023)   Received from [facility]   Overall Financial Resource Strain (CARDIA)    Difficulty of Paying Living Expenses: Not hard at all  Food Insecurity: No Food Insecurity (11/30/2023)   Received from [facility]   Hunger Vital Sign    Worried About Running Out of Food in the Last Year: Never true    Ran Out of Food in the Last Year: Never true  Transportation Needs: No Transportation Needs (11/30/2023)   Received from [facility] - Transportation    Lack of Transportation (Medical): No    Lack of Transportation (Non-Medical): No  Physical Activity: Not on file  Stress: Not on file  Social Connections: Unknown (11/19/2022)   Received from [facility]   Social Network    Social Network: Not on file    Allergies:  Allergies  Allergen Reactions    Zithromax [Azithromycin] Hives    Metabolic Disorder Labs: Lab Results  Component Value Date   HGBA1C 4.5 (L) 01/12/2022   MPG 82.45 01/12/2022   MPG 88.19 01/08/2021   No results found for: "PROLACTIN" Lab Results  Component Value Date   CHOL 177 01/12/2022   TRIG 42 01/12/2022   HDL 119 01/12/2022   CHOLHDL 1.5 01/12/2022   VLDL 8 01/12/2022   LDLCALC 50 01/12/2022   LDLCALC 38 01/08/2021   Lab Results  Component Value Date   TSH 2.288 06/07/2023   TSH 2.338 01/12/2022    Therapeutic Level Labs: No results found for: "LITHIUM" No results found for: "VALPROATE" No results found for: "CBMZ"  Current Medications: Current Outpatient Medications  Medication Sig Dispense Refill   acetaminophen (TYLENOL) 325 MG tablet Take 2 tablets (650 mg total) by mouth every 6 (six) hours as needed for mild pain (pain score 1-3) or fever.     ARIPiprazole (ABILIFY) 10 MG tablet Take  1 tablet (10 mg total) by mouth daily. 30 tablet 3   cholecalciferol (CHOLECALCIFEROL) 25 MCG tablet Take 1 tablet (1,000 Units total) by mouth daily. (Patient not taking: Reported on 09/19/2023) 30 tablet 3   folic acid (FOLVITE) 1 MG tablet Take 1 tablet (1 mg total) by mouth daily. 30 tablet 3   gabapentin (NEURONTIN) 300 MG capsule Take 1 capsule (300 mg total) by mouth 3 (three) times daily. 270 capsule 3   metoCLOPramide (REGLAN) 10 MG tablet Take 1 tablet (10 mg total) by mouth every 6 (six) hours as needed for nausea. 30 tablet 0   mirtazapine (REMERON) 30 MG tablet Take 1 tablet (30 mg total) by mouth at bedtime. 30 tablet 3   ondansetron (ZOFRAN-ODT) 8 MG disintegrating tablet Take 1 tablet (8 mg total) by mouth every 8 (eight) hours as needed for nausea. 20 tablet 0   Oxcarbazepine (TRILEPTAL) 300 MG tablet Take 1 tablet (300 mg total) by mouth 2 (two) times daily. 180 tablet 3   oxyCODONE (OXY IR/ROXICODONE) 5 MG immediate release tablet Take 1 tablet (5 mg total) by mouth every 6 (six) hours as  needed for moderate pain (pain score 4-6) or severe pain (pain score 7-10). (Patient not taking: Reported on 09/07/2023) 20 tablet 0   pantoprazole (PROTONIX) 40 MG tablet Take 1 tablet (40 mg total) by mouth 2 (two) times daily before a meal. 60 tablet 1   promethazine (PHENERGAN) 25 MG suppository Place 1 suppository (25 mg total) rectally every 6 (six) hours as needed for nausea. 12 each 0   sucralfate (CARAFATE) 1 GM/10ML suspension Take 10 mLs (1 g total) by mouth 4 (four) times daily -  with meals and at bedtime. Can substitute to tablets if solution is not available. (Patient not taking: Reported on 09/19/2023) 1200 mL 0   No current facility-administered medications for this visit.     Musculoskeletal: Strength & Muscle Tone: within normal limits and Telehealth visit Gait & Station: normal, Telehealth visit Patient leans: N/A  Psychiatric Specialty Exam: Review of Systems  There were no vitals taken for this visit.There is no height or weight on file to calculate BMI.  General Appearance: Well Groomed  Eye Contact:  Good  Speech:  Clear and Coherent and Normal Rate  Volume:  Normal  Mood:  Euthymic  Affect:  Appropriate  Thought Process:  Coherent, Irrelevant, and NA  Orientation:  Full (Time, Place, and Person)  Thought Content: WDL and Logical   Suicidal Thoughts:  No  Homicidal Thoughts:  No  Memory:  Immediate;   Good Recent;   Good Remote;   Good  Judgement:  Fair  Insight:  Fair  Psychomotor Activity:  Normal  Concentration:  Concentration: Good and Attention Span: Good  Recall:  Good  Fund of Knowledge: Good  Language: Good  Akathisia:  No  Handed:  Right  AIMS (if indicated): not done  Assets:  Communication Skills Desire for Improvement Housing Leisure Time Physical Health Social Support Talents/Skills Vocational/Educational  ADL's:  Intact  Cognition: WNL  Sleep:  Poor   Screenings: AIMS    Flowsheet Row [facility]  AIMS Total Score 0 0      AUDIT    Flowsheet Row Video Visit from 10/08/2023 in [facility] Clinical Support from 06/01/2023 in [facility] Video Visit from 05/28/2022  in [facility] Office Visit from 02/20/2022 in [facility] Ad[facility] (Discharged[facility]  Alcohol Use Disorder Identification Test Final Score (AUDIT) 18 26 18 17 8       GAD-7    Flowsheet Row Video Visit from 12/09/2023 in [facility] Video Visit from 10/08/2023 in [facility] Counselor from 06/22/2023 in [facility] Clinical Support from 06/01/2023 in [facility] Video Visit from 05/28/2022 in [facility]  Total GAD-7 Score 8 18 17 20 11       PHQ2-9    Flowsheet Row Video Visit from 12/09/2023 in [facility] Video Visit from 10/08/2023 in [facility] Counselor from 06/22/2023 in [facility] Clinical Support from 06/01/2023 in [facility] Video Visit from 05/28/2022 in [facility]  PHQ-2 Total Score 1 6 6 6 5   PHQ-9 Total Score 6 21 25 25 18       Flowsheet Row Video Visit from 10/08/2023 in [facility] ED from 09/29/2023 in [facility] Emergency Department at [facility] ED from 09/24/2023 in [facility] Emergency Department at [facility]  C-SSRS RISK CATEGORY Error: Q7 should not be populated when Q6 is No Moderate Risk No Risk        Assessment and Plan: Patient notes that she no longer uses  alcohol use since finding out hat she was pregnant. She does continue to smoke marijuana. At this time she is not interested in being referred to [facility] maternity clinic. Provider discussed risk and benefits of being on antipsychotics and antidepressants while pregnant. Patient notes that she wishes to continue her mediations. Today hydroxyzine discontinued. Patient Mirtazapine 15 mg was increased to 30 mg to help manage sleep. Patient was given a follow up visit with writer but will cancel if is is able to get in with  [facility] or [facility] in [place].   1. Alcohol-induced mood disorder (HCC)  Increased- mirtazapine (REMERON) 30 MG tablet; Take 1 tablet (30 mg total) by mouth at bedtime.  Dispense: 30 tablet; Refill: 3 Continue- ARIPiprazole (ABILIFY) 10 MG tablet; Take 1 tablet (10 mg total) by mouth daily.  Dispense: 30 tablet; Refill: 3  2. Generalized anxiety disorder  Increased- mirtazapine (REMERON) 30 MG tablet; Take 1 tablet (30 mg total) by mouth at bedtime.  Dispense: 30 tablet; Refill: 3  Collaboration of Care: Collaboration of Care: Other provider involved in patient's care AEB Counselor and PCP  Patient/Guardian was advised Release of Information must be obtained prior to any record release in order to collaborate their care with an outside provider. Patient/Guardian was advised if they have not already done so to contact the registration department to sign all necessary forms in order for us  to release information regarding their care.   Consent: Patient/Guardian gives verbal consent for treatment and assignment of benefits for services provided during this visit. Patient/Guardian expressed understanding and agreed to proceed.   Follow up in 2 months Arlyne Bering, NP 12/09/2023, 2:54 PM

## 2023-12-13 ENCOUNTER — Telehealth: Payer: Self-pay | Admitting: Neurology

## 2023-12-13 NOTE — Telephone Encounter (Signed)
 Call to patient, she agrees to come in person. Advised lab work would likely be needed and assessment may be more detailed inperson vs. VV.

## 2023-12-13 NOTE — Telephone Encounter (Signed)
 Pt called wanting to know if her appt can be switched to a VV appt. Please advise.

## 2023-12-16 ENCOUNTER — Ambulatory Visit: Admitting: Neurology

## 2023-12-16 ENCOUNTER — Other Ambulatory Visit: Payer: Self-pay | Admitting: Neurology

## 2023-12-16 ENCOUNTER — Telehealth: Payer: Self-pay | Admitting: Neurology

## 2023-12-16 ENCOUNTER — Encounter: Payer: Self-pay | Admitting: Neurology

## 2023-12-16 ENCOUNTER — Telehealth (HOSPITAL_COMMUNITY): Payer: Self-pay

## 2023-12-16 VITALS — BP 119/60 | HR 64 | Ht 65.0 in | Wt 191.0 lb

## 2023-12-16 DIAGNOSIS — R569 Unspecified convulsions: Secondary | ICD-10-CM | POA: Diagnosis not present

## 2023-12-16 DIAGNOSIS — F32A Depression, unspecified: Secondary | ICD-10-CM | POA: Diagnosis not present

## 2023-12-16 DIAGNOSIS — Z5181 Encounter for therapeutic drug level monitoring: Secondary | ICD-10-CM | POA: Diagnosis not present

## 2023-12-16 DIAGNOSIS — Z3490 Encounter for supervision of normal pregnancy, unspecified, unspecified trimester: Secondary | ICD-10-CM

## 2023-12-16 MED ORDER — VALTOCO 15 MG DOSE 2 X 7.5 MG/0.1ML NA LQPK: 15.0000 mg | NASAL | 0 refills | Status: DC | PRN

## 2023-12-16 MED ORDER — VALTOCO 20 MG DOSE 2 X 10 MG/0.1ML NA LQPK
NASAL | 2 refills | Status: AC
Start: 2023-12-16 — End: ?

## 2023-12-16 MED ORDER — LEVETIRACETAM 1000 MG PO TB3D: 1000.0000 mg | ORAL_TABLET | Freq: Two times a day (BID) | ORAL | 0 refills | Status: DC

## 2023-12-16 MED ORDER — FOLIC ACID 1 MG PO TABS: 1.0000 mg | ORAL_TABLET | Freq: Every day | ORAL | 0 refills | Status: AC

## 2023-12-16 MED ORDER — B-6 100 MG PO TABS: 100.0000 mg | ORAL_TABLET | Freq: Every day | ORAL | 0 refills | Status: AC

## 2023-12-16 MED ORDER — LEVETIRACETAM 1000 MG PO TABS: 1000.0000 mg | ORAL_TABLET | Freq: Two times a day (BID) | ORAL | 3 refills | Status: AC

## 2023-12-16 NOTE — Telephone Encounter (Signed)
 Advice - Patient called to review and to verify which medications Dr. Melisa Spray currently has her taking from last visit. Patient stated she is pregnant and wanted to be sure. Verified from review of visit 12/09/23 notes Dr. Melisa Spray discontinued Hydroxyzine  at that time, increased her Mirtazapine  to 30 mg, one at bedtime and kept her Abilify  at 10 mg, one daily.  Patient stated understanding and will contact our office back if any issues obtaining new orders from her [facility].

## 2023-12-16 NOTE — Progress Notes (Signed)
 [facility]  PATIENT: Marie Myers DOB: [birth year]  REQUESTING CLINICIAN: Trinda Fuller, FNP HISTORY FROM: Patient and chart review  REASON FOR VISIT: Seizure   HISTORICAL  CHIEF COMPLAINT:  Chief Complaint  Patient presents with   Follow-up    Pt in  12 with fiance  Pt here for Medication change due to pregnancy Pt states had seizure  last month    INTERVAL HISTORY 12/16/2023:  Patient presents today for follow-up, last visit was in January.  At that time we have decided to switch her from Keppra  to oxcarbazepine  due to irritability and anger.  She tells me she was taking the medication as directed but on March 16 did have a breakthrough seizure at home.  Husband tells me the patient was laying in bed and then had a generalized convulsion requiring him to call EMS.  In the ED patient was found to be pregnant, she was not aware of her pregnancy.  Since then, she tells me she continues take the medication, has discontinued alcohol and is following with OB/GYN.  Has not had any additional seizures.   INTERVAL HISTORY 09/07/2023:  Patient presents today for follow-up, she is accompanied by her husband.  Last visit was in January 2024.  She tells me since then,  she continued to have seizures.  Back in February, she presented to the ED after a seizure.  At discharge her gabapentin  was switched to Keppra .  She has been taking Keppra  as recommended twice daily but continued to have seizures.  She tells me with the Keppra  she does have increased irritability and anger.  Currently she is undergoing a lot of stress, tells me "she is not in a good space at the moment". She reports that her last seizure was 3 nights ago and prior to that was 2 weeks ago.  The seizure 3 nights ago was the first time that she had out of sleep.  The one 2 weeks ago was after an argument with her mother.  Husband tells me that some of her seizures are stress-induced. Denies any major injuries or  urinary incontinence with the seizures, only tongue biting.  Patient tells me that she continues to drink, usually drink about 3 glasses of wine nightly.  She does not drink beer or alcohol, wine only. She does have a psychiatrist but states that currently she is under a lot of stress but she is willing to commit herself to a mental health facility and also to rehab for alcohol cessation.     HISTORY OF PRESENT ILLNESS:  This is a 33 year old woman past medical history including hypertension, anxiety, depression, who is presenting after being admitted to the hospital for seizure.  Patient reports on December 30, she did have a total of 3 seizures described as generalized convulsion.  Seizure associated with tongue biting and urinary incontinence.  She presented to the ED, had a MRI and EEG which did not show any abnormality.  Her EEG was possibly related to alcohol withdrawal as she does report drinking and her last drink was 4 days prior to presentation. She was previously on Gabapentin  300 mg TID and plan was for patient to increase it to 600 mg TID.  On today's exam, patient reported she remembers going to work and the next thing that she remembered is waking up in the back of the ambulance.  Still her hospitalization is not clear to her.  Since leaving the hospital she has not had any additional seizures but  she continues to drink alcohol daily, she reported her last drink was 2 days ago.  She has not been taking her Gabapentin  as directed because per patient, it is not helpful. She does drink hard liquor and also wine.  She reports that she drinks alcohol to help her with sleep.  She does complain of insomnia.  She does not regularly see a psychiatrist or therapist but go to the [facility] to get her meds refilled.   Handedness: Right handed   Onset: August 22 2022  Seizure Type: Generalized convulsion   Current frequency: Only once   Any injuries from seizures: Tongue biting    Seizure risk factors: Alcohol use disorder otherwise no other reported   Previous ASMs: Gabapentin  but for anxiety and mental illness, Levetiracetam , Oxcarbazepine    Currenty ASMs: Oxcarbazepine  300 mg twice daily   ASMs side effects: Denies   Brain Images: Normal   Previous EEGs: Normal    OTHER MEDICAL CONDITIONS: Anxiety/Depression, Hypertension, Alcohol use disorder   REVIEW OF SYSTEMS: Full 14 system review of systems performed and negative with exception of: As noted in the HPI   ALLERGIES: Allergies  Allergen Reactions   Zithromax [Azithromycin] Hives    HOME MEDICATIONS: Outpatient Medications Prior to Visit  Medication Sig Dispense Refill   acetaminophen  (TYLENOL ) 325 MG tablet Take 2 tablets (650 mg total) by mouth every 6 (six) hours as needed for mild pain (pain score 1-3) or fever.     ARIPiprazole  (ABILIFY ) 10 MG tablet Take 1 tablet (10 mg total) by mouth daily. 30 tablet 3   cholecalciferol  (CHOLECALCIFEROL ) 25 MCG tablet Take 1 tablet (1,000 Units total) by mouth daily. 30 tablet 3   metoCLOPramide  (REGLAN ) 10 MG tablet Take 1 tablet (10 mg total) by mouth every 6 (six) hours as needed for nausea. 30 tablet 0   mirtazapine  (REMERON ) 30 MG tablet Take 1 tablet (30 mg total) by mouth at bedtime. 30 tablet 3   ondansetron  (ZOFRAN -ODT) 8 MG disintegrating tablet Take 1 tablet (8 mg total) by mouth every 8 (eight) hours as needed for nausea. 20 tablet 0   promethazine  (PHENERGAN ) 25 MG suppository Place 1 suppository (25 mg total) rectally every 6 (six) hours as needed for nausea. 12 each 0   folic acid  (FOLVITE ) 1 MG tablet Take 1 tablet (1 mg total) by mouth daily. 30 tablet 3   Oxcarbazepine  (TRILEPTAL ) 300 MG tablet Take 1 tablet (300 mg total) by mouth 2 (two) times daily. 180 tablet 3   gabapentin  (NEURONTIN ) 300 MG capsule Take 1 capsule (300 mg total) by mouth 3 (three) times daily. (Patient not taking: Reported on 12/16/2023) 270 capsule 3   oxyCODONE  (OXY  IR/ROXICODONE ) 5 MG immediate release tablet Take 1 tablet (5 mg total) by mouth every 6 (six) hours as needed for moderate pain (pain score 4-6) or severe pain (pain score 7-10). (Patient not taking: Reported on 09/07/2023) 20 tablet 0   pantoprazole  (PROTONIX ) 40 MG tablet Take 1 tablet (40 mg total) by mouth 2 (two) times daily before a meal. 60 tablet 1   sucralfate  (CARAFATE ) 1 GM/10ML suspension Take 10 mLs (1 g total) by mouth 4 (four) times daily -  with meals and at bedtime. Can substitute to tablets if solution is not available. (Patient not taking: Reported on 09/19/2023) 1200 mL 0   No facility-administered medications prior to visit.    PAST MEDICAL HISTORY: Past Medical History:  Diagnosis Date   Anxiety    Depression  H/O gastric bypass    Insomnia    Marijuana use 01/08/2021   Moderate alcohol use disorder (HCC) 01/08/2021    PAST SURGICAL HISTORY: Past Surgical History:  Procedure Laterality Date   CHOLECYSTECTOMY     ESOPHAGOGASTRODUODENOSCOPY (EGD) WITH PROPOFOL  N/A 06/29/2023   Procedure: ESOPHAGOGASTRODUODENOSCOPY (EGD) WITH PROPOFOL ;  Surgeon: Ozell Blunt, MD;  Location: [facility];  Service: Gastroenterology;  Laterality: N/A;  Possible balloon dilation   GASTRIC BYPASS     GASTRIC BYPASS     LAPAROSCOPY N/A 08/11/2019   Procedure: LAPAROSCOPY DIAGNOSTIC;  Surgeon: Sim Dryer, MD;  Location: [facility] OR;  Service: General;  Laterality: N/A;    FAMILY HISTORY: Family History  Problem Relation Age of Onset   Diabetes Mother    Diabetes Father    Seizures Neg Hx     SOCIAL HISTORY: Social History   Socioeconomic History   Marital status: Single    Spouse name: Not on file   Number of children: Not on file   Years of education: Not on file   Highest education level: Not on file  Occupational History   Not on file  Tobacco Use   Smoking status: Never   Smokeless tobacco: Never  Vaping Use   Vaping status: Every Day   Substances: Nicotine ,  Flavoring  Substance and Sexual Activity   Alcohol use: Yes    Alcohol/week: 1.0 standard drink of alcohol    Types: 1 Glasses of wine per week   Drug use: Yes    Types: Marijuana   Sexual activity: Not on file  Other Topics Concern   Not on file  Social History Narrative   Not on file   Social Drivers of Health   Financial Resource Strain: Low Risk  (11/30/2023)   Received from [facility]   Overall Financial Resource Strain (CARDIA)    Difficulty of Paying Living Expenses: Not hard at all  Food Insecurity: No Food Insecurity (11/30/2023)   [facility] from [facility]   Hunger Vital Sign    Worried About Running Out of Food in the Last Year: Never true    Ran Out of Food in the Last Year: Never true  Transportation Needs: No Transportation Needs (11/30/2023)   Received from [facility] - Transportation    Lack of Transportation (Medical): No    Lack of Transportation (Non-Medical): No  Physical Activity: Not on file  Stress: Not on file  Social Connections: Unknown (11/19/2022)   Received from [facility]   Social Network    Social Network: Not on file  Intimate Partner Violence: Not At Risk (11/30/2023)   Received from [facility]   Humiliation, Afraid, Rape, and Kick questionnaire    Fear of Current or Ex-Partner: No    Emotionally Abused: No    Physically Abused: No    Sexually Abused: No    PHYSICAL EXAM  GENERAL EXAM/CONSTITUTIONAL: Vitals:  Vitals:   12/16/23 1134  BP: 119/60  Pulse: 64  Weight: 191 lb (86.6 kg)  Height: 5\' 5"  (1.651 m)   Body mass index is 31.78 kg/m. Wt Readings from Last 3 Encounters:  12/16/23 191 lb (86.6 kg)  09/29/23 175 lb 0.7 oz (79.4 kg)  09/24/23 175 lb (79.4 kg)   Patient is in no distress; well developed, nourished and groomed; neck is supple  MUSCULOSKELETAL: Gait, strength, tone, movements noted in Neurologic exam below  NEUROLOGIC: MENTAL STATUS:      No data to display  awake, alert, oriented to person, place and time recent and remote memory intact normal attention and concentration language fluent, comprehension intact, naming intact fund of knowledge appropriate  CRANIAL NERVE:  2nd, 3rd, 4th, 6th - Visual fields full to confrontation, extraocular muscles intact, no nystagmus 5th - facial sensation symmetric 7th - facial strength symmetric 8th - hearing intact 9th - palate elevates symmetrically, uvula midline 11th - shoulder shrug symmetric 12th - tongue protrusion midline  MOTOR:  normal bulk and tone, full strength in the BUE, BLE  SENSORY:  normal and symmetric to light touch  COORDINATION:  finger-nose-finger, fine finger movements normal  GAIT/STATION:  normal   DIAGNOSTIC DATA (LABS, IMAGING, TESTING) - I reviewed patient records, labs, notes, testing and imaging myself where available.  Lab Results  Component Value Date   WBC 9.5 09/29/2023   HGB 13.9 09/29/2023   HCT 41.0 09/29/2023   MCV 107.9 (H) 09/29/2023   PLT 343 09/29/2023      Component Value Date/Time   NA 139 09/29/2023 0923   K 4.3 09/29/2023 0923   CL 104 09/29/2023 0923   CO2 22 09/29/2023 0923   GLUCOSE 104 (H) 09/29/2023 0923   BUN <5 (L) 09/29/2023 0923   CREATININE 0.50 09/29/2023 0923   CALCIUM 8.6 (L) 09/29/2023 0923   PROT 7.9 09/29/2023 0923   ALBUMIN 4.4 09/29/2023 0923   AST 45 (H) 09/29/2023 0923   ALT 34 09/29/2023 0923   ALKPHOS 62 09/29/2023 0923   BILITOT 0.6 09/29/2023 0923   GFRNONAA >60 09/29/2023 0923   GFRAA >60 12/27/2019 0810   Lab Results  Component Value Date   CHOL 177 01/12/2022   HDL 119 01/12/2022   LDLCALC 50 01/12/2022   TRIG 42 01/12/2022   Lab Results  Component Value Date   HGBA1C 4.5 (L) 01/12/2022   Lab Results  Component Value Date   VITAMINB12 217 06/06/2023   Lab Results  Component Value Date   TSH 2.288 06/07/2023    Routine EEG 08/23/2022 This study is within normal limits. No seizures  or epileptiform discharges were seen throughout the recording.   MRI Brain 08/23/2022 Motion degraded examination, terminated prior to completion by patient request. Within this limitation, normal MRI of the brain.    ASSESSMENT AND PLAN  33 y.o. year old female  with medical conditions including alcohol abuse, hypertension, anxiety and depression who is presenting for follow-up for her seizures.  She is currently quite pregnant, first trimester.  Had a last seizure on March 16.  She is on oxcarbazepine , we will switch her to levetiracetam .  Will start patient on 500 mg twice daily, for 1 week then increase to 1000 mg twice daily.  Will continue to monitor the levetiracetam  level monthly and adjust as necessary.  Patient understand to come every 15 of the month for lab work.  I will see him as scheduled in July.  Advised them to contact me sooner if they do have a breakthrough seizure.  I will also give them a rescue medication, Valtoco   to use in the case of a seizure.    1. Seizures (HCC)   2. Depression, unspecified depression type   3. Pregnancy, unspecified gestational age   43. Therapeutic drug monitoring      Patient Instructions  Discontinue oxcarbazepine  Start levetiracetam  500 mg twice daily for 1 week then increase to 1000 mg twice daily Will check levetiracetam  level monthly throughout the pregnancy Will provide B6 supplement to help with side effect  of levetiracetam  Advised patient to start folic acid  1 mg daily, also advised her to start prenatal vitamin.   Continue to follow-up with OB/GYN and PCP Return as scheduled in June.   Per [place] statutes, patients with seizures are not allowed to drive until they have been seizure-free for six months.  Other recommendations include using caution when using heavy equipment or power tools. Avoid working on ladders or at heights. Take showers instead of baths.  Do not swim alone.  Ensure the water  temperature is not too  high on the home water  heater. Do not go swimming alone. Do not lock yourself in a room alone (i.e. bathroom). When caring for infants or small children, sit down when holding, feeding, or changing them to minimize risk of injury to the child in the event you have a seizure. Maintain good sleep hygiene. Avoid alcohol.  Also recommend adequate sleep, hydration, good diet and minimize stress.   During the Seizure  - First, ensure adequate ventilation and place patients on the floor on their left side  Loosen clothing around the neck and ensure the airway is patent. If the patient is clenching the teeth, do not force the mouth open with any object as this can cause severe damage - Remove all items from the surrounding that can be hazardous. The patient may be oblivious to what's happening and may not even know what he or she is doing. If the patient is confused and wandering, either gently guide him/her away and block access to outside areas - Reassure the individual and be comforting - Call 911. In most cases, the seizure ends before EMS arrives. However, there are cases when seizures may last over 3 to 5 minutes. Or the individual may have developed breathing difficulties or severe injuries. If a pregnant patient or a person with diabetes develops a seizure, it is prudent to call an ambulance. - Finally, if the patient does not regain full consciousness, then call EMS. Most patients will remain confused for about 45 to 90 minutes after a seizure, so you must use judgment in calling for help. - Avoid restraints but make sure the patient is in a bed with padded side rails - Place the individual in a lateral position with the neck slightly flexed; this will help the saliva drain from the mouth and prevent the tongue from falling backward - Remove all nearby furniture and other hazards from the area - Provide verbal assurance as the individual is regaining consciousness - Provide the patient with privacy  if possible - Call for help and start treatment as ordered by the caregiver   After the Seizure (Postictal Stage)  After a seizure, most patients experience confusion, fatigue, muscle pain and/or a headache. Thus, one should permit the individual to sleep. For the next few days, reassurance is essential. Being calm and helping reorient the person is also of importance.  Most seizures are painless and end spontaneously. Seizures are not harmful to others but can lead to complications such as stress on the lungs, brain and the heart. Individuals with prior lung problems may develop labored breathing and respiratory distress.     Orders Placed This Encounter  Procedures   Levetiracetam  level   Levetiracetam  level   Levetiracetam  level   Levetiracetam  level   Levetiracetam  level   Levetiracetam  level    Meds ordered this encounter  Medications   levETIRAcetam  1000 MG TB3D    Sig: Take 1,000 mg by mouth 2 (two) times  daily.    Dispense:  180 tablet    Refill:  0   folic acid  (FOLVITE ) 1 MG tablet    Sig: Take 1 tablet (1 mg total) by mouth daily.    Dispense:  90 tablet    Refill:  0   Pyridoxine HCl (B-6) 100 MG TABS    Sig: Take 100 mg by mouth daily.    Dispense:  90 tablet    Refill:  0   diazePAM , 15 MG Dose, (VALTOCO  15 MG DOSE) 2 x 7.5 MG/0.1ML LQPK    Sig: Place 15 mg into the nose as needed (For seizure lasting more than 2 minutes).    Dispense:  2 each    Refill:  0    Please provide 2 boxes for a total of 4 doses    No follow-ups on file.   Cassandra Cleveland, MD 12/16/2023, 12:14 PM  [facility] [address] [place], [place] [address] [phone]

## 2023-12-16 NOTE — Telephone Encounter (Signed)
 Pt reports that re: her levETIRAcetam  1000 MG TB3D a liquid form was called in which was denied by her insurance.  Pt is asking that tablet form be called in for her

## 2023-12-16 NOTE — Telephone Encounter (Signed)
 Completed.    Thanks

## 2023-12-16 NOTE — Patient Instructions (Signed)
 Discontinue oxcarbazepine  Start levetiracetam  500 mg twice daily for 1 week then increase to 1000 mg twice daily Will check levetiracetam  level monthly throughout the pregnancy Will provide B6 supplement to help with side effect of levetiracetam  Advised patient to start folic acid  1 mg daily, also advised her to start prenatal vitamin.   Continue to follow-up with OB/GYN and PCP Return as scheduled in June.

## 2023-12-16 NOTE — Telephone Encounter (Signed)
 Pt has called and rescheduled her appointment which was scheduled for 4-25 to an 11:45 slot that was open for Dr Samara Crest this morning.  Pt aware to arrive at 11:15 for appointment.

## 2023-12-17 ENCOUNTER — Ambulatory Visit: Admitting: Neurology

## 2023-12-29 ENCOUNTER — Encounter (HOSPITAL_COMMUNITY): Payer: Self-pay | Admitting: Psychiatry

## 2023-12-29 ENCOUNTER — Telehealth (HOSPITAL_COMMUNITY): Payer: Medicaid Other | Admitting: Psychiatry

## 2023-12-29 DIAGNOSIS — F1094 Alcohol use, unspecified with alcohol-induced mood disorder: Secondary | ICD-10-CM

## 2023-12-29 DIAGNOSIS — Z331 Pregnant state, incidental: Secondary | ICD-10-CM

## 2023-12-29 DIAGNOSIS — F411 Generalized anxiety disorder: Secondary | ICD-10-CM | POA: Diagnosis not present

## 2023-12-29 MED ORDER — MIRTAZAPINE 30 MG PO TABS: 30.0000 mg | ORAL_TABLET | Freq: Every day | ORAL | 4 refills | Status: DC

## 2023-12-29 MED ORDER — ARIPIPRAZOLE 10 MG PO TABS: 10.0000 mg | ORAL_TABLET | Freq: Every day | ORAL | 4 refills | Status: DC

## 2023-12-29 NOTE — Progress Notes (Signed)
 BH MD/PA/NP OP Progress Note Virtual Visit via Video Note  I connected with Marie Myers on 12/29/23 at  8:30 AM EDT by a video enabled telemedicine application and verified that I am speaking with the correct person using two identifiers.  Location: Patient: Home Provider: Clinic   I discussed the limitations of evaluation and management by telemedicine and the availability of in person appointments. The patient expressed understanding and agreed to proceed.  I provided 30 minutes of non-face-to-face time during this encounter.   12/29/2023 9:24 AM Marie Myers  MRN:  [id number]  Chief Complaint: "I am [redacted] weeks pregnant."  HPI: 33 year old female seen today for follow-up psychiatric evaluation.  She has a psychiatric history of depression, alcohol use disorder, anxiety, insomnia, and marijuana use.  Currently she is managed on Abilify  10 and Mirtazapine  30 mg nightly. Patient also takes gabapentin  300 mg three times daily (prescribed by her neurologist) and Trileptal  300 mg twice daily (prescribed by her neurologist). She notes her medications are not effective in managing her psychiatric conditions.   Today she is well-groomed, pleasant, cooperative, and engaged in conversation.  Patient informed Clinical research associate that she is doing fine.  She notes that she continues to be pregnant and notes that her unborn child is doing well as well.  Patient notes that her mood is stable.  Patient informed Clinical research associate that she continues Abilify  and mirtazapine  and finds them effective.  Patient disconnected from the virtual link.  Provider attempted to call patient and her phone continue to hang up.   Provider unable to do GAD-7 or PHQ-9 today as patient's connection was interrupted/disconnected.  Patient now lives in [place].  Provider informed patient that [facility] generally services [place]. Patient given resources to [employer] and [facility]  [place].   No medication changes made  today.  Patient agreeable to continue medication as prescribed.  Patient will follow-[facility] at [facility] or [facility] in [place]. No other concerns noted at this time.   Visit Diagnosis:    ICD-10-CM   1. 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 Generalized anxiety disorder  F41.1 mirtazapine  (REMERON ) 30 MG tablet         Past Psychiatric History:  depression, alcohol use disorder, anxiety, insomnia, and marijuana use  Past Medical History:  Past Medical History:  Diagnosis Date   Anxiety    Depression    H/O gastric bypass    Insomnia    Marijuana use 01/08/2021   Moderate alcohol use disorder (HCC) 01/08/2021    Past Surgical History:  Procedure Laterality Date   CHOLECYSTECTOMY     ESOPHAGOGASTRODUODENOSCOPY (EGD) WITH PROPOFOL  N/A 06/29/2023   Procedure: ESOPHAGOGASTRODUODENOSCOPY (EGD) WITH PROPOFOL ;  Surgeon: Ozell Blunt, MD;  Location: [facility];  Service: Gastroenterology;  Laterality: N/A;  Possible balloon dilation   GASTRIC BYPASS     GASTRIC BYPASS     LAPAROSCOPY N/A 08/11/2019   Procedure: LAPAROSCOPY DIAGNOSTIC;  Surgeon: Sim Dryer, MD;  Location: [facility] OR;  Service: General;  Laterality: N/A;    Family Psychiatric History: Son SI and ADHD  Family History:  Family History  Problem Relation Age of Onset   Diabetes Mother    Diabetes Father    Seizures Neg Hx     Social History:  Social History   Socioeconomic History   Marital status: Single    Spouse name: Not on file   Number  of children: Not on file   Years of education: Not on file   Highest education level: Not on file  Occupational History   Not on file  Tobacco Use   Smoking status: Never   Smokeless tobacco: Never  Vaping Use   Vaping status: Every Day   Substances: Nicotine , Flavoring  Substance and Sexual Activity   Alcohol use: Yes    Alcohol/week: 1.0 standard  drink of alcohol    Types: 1 Glasses of wine per week   Drug use: Yes    Types: Marijuana   Sexual activity: Not on file  Other Topics Concern   Not on file  Social History Narrative   Not on file   Social Drivers of Health   Financial Resource Strain: Low Risk  (11/30/2023)   [facility]   Overall Financial Resource Strain (CARDIA)    Difficulty of Paying Living Expenses: Not hard at all  Food Insecurity: No Food Insecurity (11/30/2023)   Received from [facility]   Hunger Vital Sign    Worried About Running Out of Food in the Last Year: Never true    Ran Out of Food in the Last Year: Never true  Transportation Needs: No Transportation Needs (11/30/2023)   Received from [facility] - Transportation    Lack of Transportation (Medical): No    Lack of Transportation (Non-Medical): No  Physical Activity: Not on file  Stress: Not on file  Social Connections: Unknown (11/19/2022)   [facility]   Social Network    Social Network: Not on file    Allergies:  Allergies  Allergen Reactions   Zithromax [Azithromycin] Hives    Metabolic Disorder Labs: Lab Results  Component Value Date   HGBA1C 4.5 (L) 01/12/2022   MPG 82.45 01/12/2022   MPG 88.19 01/08/2021   No results found for: "PROLACTIN" Lab Results  Component Value Date   CHOL 177 01/12/2022   TRIG 42 01/12/2022   HDL 119 01/12/2022   CHOLHDL 1.5 01/12/2022   VLDL 8 01/12/2022   LDLCALC 50 01/12/2022   LDLCALC 38 01/08/2021   Lab Results  Component Value Date   TSH 2.288 06/07/2023   TSH 2.338 01/12/2022    Therapeutic Level Labs: No results found for: "LITHIUM" No results found for: "VALPROATE" No results found for: "CBMZ"  Current Medications: Current Outpatient Medications  Medication Sig Dispense Refill   acetaminophen  (TYLENOL ) 325 MG tablet Take 2 tablets (650 mg total) by mouth every 6 (six) hours as needed for mild pain (pain score 1-3) or  fever.     ARIPiprazole  (ABILIFY ) 10 MG tablet Take 1 tablet (10 mg total) by mouth daily. 30 tablet 4   cholecalciferol  (CHOLECALCIFEROL ) 25 MCG tablet Take 1 tablet (1,000 Units total) by mouth daily. 30 tablet 3   diazePAM , 20 MG Dose, (VALTOCO  20 MG DOSE) 2 x 10 MG/0.1ML LQPK Spray 1 vial into each nostril at onset on convulsion and call 911 2 each 2   folic acid  (FOLVITE ) 1 MG tablet Take 1 tablet (1 mg total) by mouth daily. 90 tablet 0   levETIRAcetam  (KEPPRA ) 1000 MG tablet Take 1 tablet (1,000 mg total) by mouth 2 (two) times daily. 90 tablet 3   metoCLOPramide  (REGLAN ) 10 MG tablet Take 1 tablet (10 mg total) by mouth every 6 (six) hours as needed for nausea. 30 tablet 0   mirtazapine  (REMERON ) 30 MG tablet Take 1 tablet (30 mg total) by  mouth at bedtime. 30 tablet 4   ondansetron  (ZOFRAN -ODT) 8 MG disintegrating tablet Take 1 tablet (8 mg total) by mouth every 8 (eight) hours as needed for nausea. 20 tablet 0   promethazine  (PHENERGAN ) 25 MG suppository Place 1 suppository (25 mg total) rectally every 6 (six) hours as needed for nausea. 12 each 0   Pyridoxine HCl (B-6) 100 MG TABS Take 100 mg by mouth daily. 90 tablet 0   No current facility-administered medications for this visit.     Musculoskeletal: Strength & Muscle Tone: within normal limits and Telehealth visit Gait & Station: normal, Telehealth visit Patient leans: N/A  Psychiatric Specialty Exam: Review of Systems  There were no vitals taken for this visit.There is no height or weight on file to calculate BMI.  General Appearance: Well Groomed  Eye Contact:  Good  Speech:  Clear and Coherent and Normal Rate  Volume:  Normal  Mood:  Euthymic  Affect:  Appropriate  Thought Process:  Coherent, Goal Directed, and Linear  Orientation:  Full (Time, Place, and Person)  Thought Content: WDL and Logical   Suicidal Thoughts:  No  Homicidal Thoughts:  No  Memory:  Immediate;   Good Recent;   Good Remote;   Good   Judgement:  Fair  Insight:  Fair  Psychomotor Activity:  Normal  Concentration:  Concentration: Good and Attention Span: Good  Recall:  Good  Fund of Knowledge: Good  Language: Good  Akathisia:  No  Handed:  Right  AIMS (if indicated): not done  Assets:  Communication Skills Desire for Improvement Housing Leisure Time Physical Health Social Support Talents/Skills Vocational/Educational  ADL's:  Intact  Cognition: WNL  Sleep:  Good   Screenings: AIMS    Flowsheet Row [facility] ([facility]  AIMS Total Score 0 0      AUDIT    Flowsheet Row Video Visit from 10/08/2023 in [facility] Clinical Support from 06/01/2023 in [facility] Video Visit from 05/28/2022 in [facility] Office Visit from 02/20/2022 in [facility] ([facility]  Alcohol Use Disorder Identification Test Final Score (AUDIT) 18 26 18 17 8       GAD-7    Flowsheet Row Video Visit from 12/09/2023 in [facility] Video Visit from 10/08/2023 in [facility] Counselor from 06/22/2023 in [facility] Clinical Support from 06/01/2023 in [facility] Video Visit from 05/28/2022 in [facility]  Total GAD-7 Score 8 18 17 20 11       PHQ2-9    Flowsheet Row Video Visit from 12/09/2023 in [facility] Video Visit from 10/08/2023 in [facility] Counselor from 06/22/2023 in [facility] Clinical Support from 06/01/2023 in [facility] Video Visit from  05/28/2022 in [facility]  PHQ-2 Total Score 1 6 6 6 5   PHQ-9 Total Score 6 21 25 25 18       Flowsheet Row Video Visit from 10/08/2023 in [facility] ED from 09/29/2023 in [facility] at [facility] ED from 09/24/2023 in [facility] Emergency Department at [facility]  C-SSRS RISK CATEGORY Error: Q7 should  not be populated when Q6 is No Moderate Risk No Risk        Assessment and Plan: Patient notes that she is doing well on her current medication regimen.  Patient lives in [place].  Patient was given resources to [employer] or [employer] in [place].  No medication changes made today.  Patient agreeable to continue medication as prescribed.   1. Alcohol-induced mood disorder (HCC)  Continue- mirtazapine  (REMERON ) 30 MG tablet; Take 1 tablet (30 mg total) by mouth at bedtime.  Dispense: 30 tablet; Refill: 4 Continue- ARIPiprazole  (ABILIFY ) 10 MG tablet; Take 1 tablet (10 mg total) by mouth daily.  Dispense: 30 tablet; Refill: 4  2. Generalized anxiety disorder  Continue- mirtazapine  (REMERON ) 30 MG tablet; Take 1 tablet (30 mg total) by mouth at bedtime.  Dispense: 30 tablet; Refill: 4   Collaboration of Care: Collaboration of Care: Other provider involved in patient's care AEB Counselor and PCP  Patient/Guardian was advised Release of Information must be obtained prior to any record release in order to collaborate their care with an outside provider. Patient/Guardian was advised if they have not already done so to contact the registration department to sign all necessary forms in order for us  to release information regarding their care.   Consent: Patient/Guardian gives verbal consent for treatment and assignment of benefits for services provided during this visit. Patient/Guardian expressed understanding and agreed to proceed.   Follow up as needed Arlyne Bering, NP 12/29/2023, 9:24 AM

## 2024-01-26 ENCOUNTER — Telehealth: Payer: Self-pay | Admitting: Neurology

## 2024-01-26 NOTE — Telephone Encounter (Signed)
 Call to [facility], spoke to Maria Stein. She inquired if patient has come for monthly keppra  lab work. Advised the orders are in but hasn't come. Does have upcoming appointment and will call to remind patient about appt and lab work. Unable to reach patient by phone, mychart message sent.

## 2024-01-26 NOTE — Telephone Encounter (Signed)
 Jenette Mitchell from [place] called in regards to wanting to know should they check  Pt keppra  level or it de done at PT NEXT office visit . Informed  Jenette Mitchell that Pt has an upcoming Appt  June 23.[facility]  OBGYN  Callback [phone]

## 2024-02-14 ENCOUNTER — Ambulatory Visit: Admitting: Neurology

## 2024-02-14 ENCOUNTER — Encounter: Payer: Self-pay | Admitting: Neurology

## 2024-03-15 ENCOUNTER — Ambulatory Visit: Payer: Medicaid Other | Admitting: Neurology

## 2024-06-29 ENCOUNTER — Encounter (HOSPITAL_COMMUNITY): Payer: Self-pay | Admitting: Psychiatry

## 2024-06-29 ENCOUNTER — Telehealth (INDEPENDENT_AMBULATORY_CARE_PROVIDER_SITE_OTHER): Admitting: Psychiatry

## 2024-06-29 DIAGNOSIS — F1994 Other psychoactive substance use, unspecified with psychoactive substance-induced mood disorder: Secondary | ICD-10-CM

## 2024-06-29 DIAGNOSIS — F411 Generalized anxiety disorder: Secondary | ICD-10-CM

## 2024-06-29 MED ORDER — BUPROPION HCL ER (XL) 150 MG PO TB24: 150.0000 mg | ORAL_TABLET | ORAL | 3 refills | Status: DC

## 2024-06-29 MED ORDER — QUETIAPINE FUMARATE 50 MG PO TABS: 50.0000 mg | ORAL_TABLET | Freq: Every day | ORAL | 3 refills | Status: DC

## 2024-06-29 NOTE — Progress Notes (Signed)
 [facility] MD/PA/NP OP Progress Note Virtual Visit via Video Note  I connected with Marie Myers on 06/29/24 at  2:30 PM EST by a video enabled telemedicine application and verified that I am speaking with the correct person using two identifiers.  Location: Patient: Work Provider: Clinic   I discussed the limitations of evaluation and management by telemedicine and the availability of in person appointments. The patient expressed understanding and agreed to proceed.  I provided 30 minutes of non-face-to-face time during this encounter.   06/29/2024 3:39 PM Marie Myers  MRN:  [id number]  Chief Complaint: I don't think my medications are working  HPI: 33 year old female seen today for follow-up psychiatric evaluation.  She has a psychiatric history of depression, alcohol use disorder, anxiety, insomnia, and marijuana use.  Currently she is managed on Abilify  10 and Mirtazapine  30 mg nightly. Patient also takes gabapentin  300 mg three times daily (prescribed by her neurologist) and Keppra  100 mg twice daily(prescribed by her neurologist). She notes she no longer takes gabapentin  and reports that her medications are ineffective in managing her psychiatric conditions.    Today she is well-groomed, pleasant, cooperative, and engaged in conversation.  Patient informed clinical research associate that she feels sad all the time. She reports that she want to adjust her medications and notes that she wants to try something new. Patient notes that it is difficult for her to attend to activities of daily living (cooking/cleaning). She also notes that her sleep is poor. She notes that she sleeps 4-5 hours. She notes that her appetite is poor.  Patient reports that prior to her pregnancy she found her medications ineffective but continue them out of fear of side effects.  At this point she notes that she wants to discontinue them. She does note that she has stopped mirtazipine  Provider asked patient if she continues to use  alcohol or marijuana.  She notes that she has been sober from alcohol for over 9 months.  She does note that she smokes marijuana daily.  Provider informed patient that marijuana can exacerbate her mental health and encouraged her to reduce/discontinue her consumption.  Mentally patient notes that she feels that she is in a dream. She notes that she feels that she disassociated. She describes feeling that she is in the present but not in the present. She denies active hallucinations or paranoia. She does note that she feels distracted, irritable, have fluctuations in mood, and impulsive (reports that she spends on food and reports that she give money to her family).  Patient informed clinical research associate that she delivered her daughter Marie Myers 3 weeks ago.  She notes that her daughter is doing well.  She reports that she is able to attend to her daughter's need and has a support of her sister and her child's father.  Patient note that the above exacerbates her anxiety and depression.  Today provider conducted GAD-7 and patient scored a 16.  Provider conducted PHQ-9 and patient scored a 20.    Patient reports that she discontinued mirtazapine  a week ago.  She wants to also discontinue Abilify .  Provider recommended patient cutting her Abilify  tablet in half for a week and then discontinuing it.  She was agreeable to starting Seroquel 50 mg nightly to help manage sleep, anxiety, depression, and mood.  She is also agreeable to starting Wellbutrin XL 150 mg to help manage motivation and depressive state.Potential side effects of medication and risks vs benefits of treatment vs non-treatment were explained and discussed. All questions were  answered.  She will follow-up outpatient counseling for therapy. No other concerns noted at this time.   Visit Diagnosis:    ICD-10-CM   1. Generalized anxiety disorder  F41.1 QUEtiapine (SEROQUEL) 50 MG tablet    2. Substance induced mood disorder (HCC)  F19.94 buPROPion (WELLBUTRIN  XL) 150 MG 24 hr tablet    QUEtiapine (SEROQUEL) 50 MG tablet          Past Psychiatric History:  depression, alcohol use disorder, anxiety, insomnia, and marijuana use  Past Medical History:  Past Medical History:  Diagnosis Date   Anxiety    Depression    H/O gastric bypass    Insomnia    Marijuana use 01/08/2021   Moderate alcohol use disorder (HCC) 01/08/2021    Past Surgical History:  Procedure Laterality Date   CHOLECYSTECTOMY     ESOPHAGOGASTRODUODENOSCOPY (EGD) WITH PROPOFOL  N/A 06/29/2023   Procedure: ESOPHAGOGASTRODUODENOSCOPY (EGD) WITH PROPOFOL ;  Surgeon: Rosalie Kitchens, MD;  Location: [facility];  Service: Gastroenterology;  Laterality: N/A;  Possible balloon dilation   GASTRIC BYPASS     GASTRIC BYPASS     LAPAROSCOPY N/A 08/11/2019   Procedure: LAPAROSCOPY DIAGNOSTIC;  Surgeon: Vanderbilt Ned, MD;  Location: [facility];  Service: General;  Laterality: N/A;    Family Psychiatric History: Son SI and ADHD  Family History:  Family History  Problem Relation Age of Onset   Diabetes Mother    Diabetes Father    Seizures Neg Hx     Social History:  Social History   Socioeconomic History   Marital status: Single    Spouse name: Not on file   Number of children: Not on file   Years of education: Not on file   Highest education level: Not on file  Occupational History   Not on file  Tobacco Use   Smoking status: Never   Smokeless tobacco: Never  Vaping Use   Vaping status: Every Day   Substances: Nicotine , Flavoring  Substance and Sexual Activity   Alcohol use: Yes    Alcohol/week: 1.0 standard drink of alcohol    Types: 1 Glasses of wine per week   Drug use: Yes    Types: Marijuana   Sexual activity: Not on file  Other Topics Concern   Not on file  Social History Narrative   Not on file   Social Drivers of Health   Financial Resource Strain: Low Risk  (11/30/2023)   Received from [facility]   Overall Financial Resource Strain (CARDIA)     Difficulty of Paying Living Expenses: Not hard at all  Food Insecurity: No Food Insecurity (05/07/2024)   Received from [facility]   Hunger Vital Sign    Within the past 12 months, you worried that your food would run out before you got the money to buy more.: Never true    Within the past 12 months, the food you bought just didn't last and you didn't have money to get more.: Never true  Transportation Needs: No Transportation Needs (05/07/2024)   Received from [facility] - Transportation    In the past 12 months, has lack of transportation kept you from medical appointments or from getting medications?: No    In the past 12 months, has lack of transportation kept you from meetings, work, or from getting things needed for daily living?: No  Physical Activity: Not on file  Stress: No Stress Concern Present (05/07/2024)   Received from [facility]  [facility] Occup[facility] - Occupational Stress Questionnaire    Do you feel stress - tense, restless, nervous, or anxious, or unable to sleep at night because your mind is troubled all the time - these days?: Only a little  Social Connections: Unknown (11/19/2022)   Received from [facility]   Social Network    Social Network: Not on file    Allergies:  Allergies  Allergen Reactions   Zithromax [Azithromycin] Hives    Metabolic Disorder Labs: Lab Results  Component Value Date   HGBA1C 4.5 (L) 01/12/2022   MPG 82.45 01/12/2022   MPG 88.19 01/08/2021   No results found for: PROLACTIN Lab Results  Component Value Date   CHOL 177 01/12/2022   TRIG 42 01/12/2022   HDL 119 01/12/2022   CHOLHDL 1.5 01/12/2022   VLDL 8 01/12/2022   LDLCALC 50 01/12/2022   LDLCALC 38 01/08/2021   Lab Results  Component Value Date   TSH 2.288 06/07/2023   TSH 2.338 01/12/2022    Therapeutic Level Labs: No results found for: LITHIUM No results found for: VALPROATE No results found for: CBMZ  Current  Medications: Current Outpatient Medications  Medication Sig Dispense Refill   buPROPion (WELLBUTRIN XL) 150 MG 24 hr tablet Take 1 tablet (150 mg total) by mouth every morning. 30 tablet 3   QUEtiapine (SEROQUEL) 50 MG tablet Take 1 tablet (50 mg total) by mouth at bedtime. 30 tablet 3   acetaminophen  (TYLENOL ) 325 MG tablet Take 2 tablets (650 mg total) by mouth every 6 (six) hours as needed for mild pain (pain score 1-3) or fever.     cholecalciferol  (CHOLECALCIFEROL ) 25 MCG tablet Take 1 tablet (1,000 Units total) by mouth daily. 30 tablet 3   diazePAM , 20 MG Dose, (VALTOCO  20 MG DOSE) 2 x 10 MG/0.1ML LQPK Spray 1 vial into each nostril at onset on convulsion and call 911 2 each 2   levETIRAcetam  (KEPPRA ) 1000 MG tablet Take 1 tablet (1,000 mg total) by mouth 2 (two) times daily. 90 tablet 3   metoCLOPramide  (REGLAN ) 10 MG tablet Take 1 tablet (10 mg total) by mouth every 6 (six) hours as needed for nausea. 30 tablet 0   mirtazapine  (REMERON ) 30 MG tablet Take 1 tablet (30 mg total) by mouth at bedtime. 30 tablet 4   ondansetron  (ZOFRAN -ODT) 8 MG disintegrating tablet Take 1 tablet (8 mg total) by mouth every 8 (eight) hours as needed for nausea. 20 tablet 0   promethazine  (PHENERGAN ) 25 MG suppository Place 1 suppository (25 mg total) rectally every 6 (six) hours as needed for nausea. 12 each 0   Pyridoxine HCl (B-6) 100 MG TABS Take 100 mg by mouth daily. 90 tablet 0   No current facility-administered medications for this visit.     Musculoskeletal: Strength & Muscle Tone: within normal limits and Telehealth visit Gait & Station: normal, Telehealth visit Patient leans: N/A  Psychiatric Specialty Exam: Review of Systems  There were no vitals taken for this visit.There is no height or weight on file to calculate BMI.  General Appearance: Well Groomed  Eye Contact:  Good  Speech:  Clear and Coherent and Normal Rate  Volume:  Normal  Mood:  Anxious and Depressed  Affect:  Appropriate   Thought Process:  Coherent, Goal Directed, and Linear  Orientation:  Full (Time, Place, and Person)  Thought Content: WDL and Logical   Suicidal Thoughts:  No  Homicidal Thoughts:  No  Memory:  Immediate;   Good Recent;  Good Remote;   Good  Judgement:  Fair  Insight:  Fair  Psychomotor Activity:  Normal  Concentration:  Concentration: Good and Attention Span: Good  Recall:  Good  Fund of Knowledge: Good  Language: Good  Akathisia:  No  Handed:  Right  AIMS (if indicated): not done  Assets:  Communication Skills Desire for Improvement Housing Leisure Time Physical Health Social Support Talents/Skills Vocational/Educational  ADL's:  Intact  Cognition: WNL  Sleep:  Poor   Screenings: AIMS    Flowsheet Row [facility]  AIMS Total Score 0 0   AUDIT    Flowsheet Row Video Visit from 10/08/2023 in [facility] Clinical Support from 06/01/2023 in [facility] Video Visit from 05/28/2022 in [facility] Office Visit from 02/20/2022 in [facility] Ad[facility] (Dis[facility]  Alcohol Use Disorder Identification Test Final Score (AUDIT) 18 26 18 17 8    GAD-7    Flowsheet Row Video Visit from 06/29/2024 in [facility] Video Visit from 12/09/2023 in [facility] Video Visit from 10/08/2023 in [facility] Counselor from 06/22/2023 in [facility] Clinical Support from 06/01/2023 in [facility]  Total GAD-7 Score 16 8 18 17 20    PHQ2-9    Flowsheet Row Video Visit from 06/29/2024 in [facility] Video Visit from 12/09/2023 in [facility] Video Visit from 10/08/2023 in [facility] Counselor from 06/22/2023 in [facility] Clinical Support from 06/01/2023 in [facility]  PHQ-2 Total Score 5 1 6 6 6   PHQ-9 Total Score 20 6 21 25 25    Flowsheet Row Video Visit from 06/29/2024 in [facility] Video Visit from 10/08/2023 in [facility] ED from 09/29/2023 in [facility] Emergency Department at [facility]  C-SSRS RISK CATEGORY Error: Q3, 4, or 5 should not be populated when Q2 is No Error: Q7 should not be populated when Q6 is No Moderate Risk     Assessment and Plan: Patient notes that her sleep, anxiety, and depression continues to be problematic. She continues to smoke marijuana to cope. She denied alcohol use and notes that she has been sober for 9 months. Patient reports that she discontinued mirtazapine  a week ago.  She wants to also discontinue Abilify .  Provider recommended patient cutting her Abilify  tablet in half for a week and then discontinuing it.  She was agreeable to starting Seroquel 50 mg nightly to help manage sleep, anxiety, depression, and mood.  She is also agreeable to starting Wellbutrin XL 150 mg to help manage motivation and depressive state.  1. Generalized anxiety disorder (Primary)  Start- QUEtiapine (SEROQUEL) 50 MG tablet; Take 1 tablet (50 mg total) by mouth at bedtime.  Dispense: 30 tablet; Refill: 3  2. Substance induced mood disorder (HCC)  Start- buPROPion (WELLBUTRIN XL) 150 MG 24 hr tablet; Take 1 tablet (150 mg total) by mouth every morning.  Dispense: 30 tablet; Refill: 3 Start- QUEtiapine (SEROQUEL) 50 MG tablet; Take 1 tablet (50 mg total) by mouth at bedtime.  Dispense: 30 tablet; Refill: 3  Collaboration of Care: Collaboration of Care: Other provider  involved in patient's  care AEB Counselor and PCP  Patient/Guardian was advised Release of Information must be obtained prior to any record release in order to collaborate their care with an outside provider. Patient/Guardian was advised if they have not already done so to contact the registration department to sign all necessary forms in order for us  to release information regarding their care.   Consent: Patient/Guardian gives verbal consent for treatment and assignment of benefits for services provided during this visit. Patient/Guardian expressed understanding and agreed to proceed.   Follow up as needed Zane FORBES Bach, NP 06/29/2024, 3:39 PM

## 2024-06-30 ENCOUNTER — Telehealth (HOSPITAL_COMMUNITY): Payer: Self-pay

## 2024-06-30 NOTE — Telephone Encounter (Addendum)
 PA submitted via cover my meds, awaiting decision  11- 02-2024- day submitted.   JNL

## 2024-07-24 ENCOUNTER — Other Ambulatory Visit (HOSPITAL_COMMUNITY): Payer: Self-pay | Admitting: Psychiatry

## 2024-07-24 DIAGNOSIS — F411 Generalized anxiety disorder: Secondary | ICD-10-CM

## 2024-07-24 DIAGNOSIS — F1994 Other psychoactive substance use, unspecified with psychoactive substance-induced mood disorder: Secondary | ICD-10-CM

## 2024-08-25 ENCOUNTER — Emergency Department (HOSPITAL_COMMUNITY)

## 2024-08-25 ENCOUNTER — Emergency Department (HOSPITAL_COMMUNITY)
Admission: EM | Admit: 2024-08-25 | Discharge: 2024-08-25 | Disposition: A | Discharge status: Home / Self Care | Source: Home / Self Care

## 2024-08-25 DIAGNOSIS — M533 Sacrococcygeal disorders, not elsewhere classified: Secondary | ICD-10-CM | POA: Diagnosis not present

## 2024-08-25 DIAGNOSIS — R519 Headache, unspecified: Secondary | ICD-10-CM | POA: Insufficient documentation

## 2024-08-25 DIAGNOSIS — R569 Unspecified convulsions: Secondary | ICD-10-CM | POA: Diagnosis present

## 2024-08-25 LAB — CBC WITH DIFFERENTIAL/PLATELET
Abs Immature Granulocytes: 0.05 K/uL (ref 0.00–0.07)
Basophils Absolute: 0 K/uL (ref 0.0–0.1)
Basophils Relative: 0 %
Eosinophils Absolute: 0 K/uL (ref 0.0–0.5)
Eosinophils Relative: 0 %
HCT: 36.9 % (ref 36.0–46.0)
Hemoglobin: 12.9 g/dL (ref 12.0–15.0)
Immature Granulocytes: 0 %
Lymphocytes Relative: 9 %
Lymphs Abs: 1.2 K/uL (ref 0.7–4.0)
MCH: 33.3 pg (ref 26.0–34.0)
MCHC: 35 g/dL (ref 30.0–36.0)
MCV: 95.3 fL (ref 80.0–100.0)
Monocytes Absolute: 1.1 K/uL — ABNORMAL HIGH (ref 0.1–1.0)
Monocytes Relative: 8 %
Neutro Abs: 11.1 K/uL — ABNORMAL HIGH (ref 1.7–7.7)
Neutrophils Relative %: 83 %
Platelets: ADEQUATE K/uL (ref 150–400)
RBC: 3.87 MIL/uL (ref 3.87–5.11)
RDW: 13 % (ref 11.5–15.5)
WBC: 13.6 K/uL — ABNORMAL HIGH (ref 4.0–10.5)
nRBC: 0 % (ref 0.0–0.2)

## 2024-08-25 LAB — BASIC METABOLIC PANEL WITH GFR
Anion gap: 15 (ref 5–15)
BUN: 10 mg/dL (ref 6–20)
CO2: 19 mmol/L — ABNORMAL LOW (ref 22–32)
Calcium: 8.1 mg/dL — ABNORMAL LOW (ref 8.9–10.3)
Chloride: 102 mmol/L (ref 98–111)
Creatinine, Ser: 0.69 mg/dL (ref 0.44–1.00)
GFR, Estimated: >60 mL/min
Glucose, Bld: 72 mg/dL (ref 70–99)
Potassium: 4.3 mmol/L (ref 3.5–5.1)
Sodium: 136 mmol/L (ref 135–145)

## 2024-08-25 LAB — URINE DRUG SCREEN
Amphetamines: NEGATIVE
Barbiturates: POSITIVE — AB
Benzodiazepines: NEGATIVE
Cocaine: NEGATIVE
Fentanyl: NEGATIVE
Methadone Scn, Ur: NEGATIVE
Opiates: NEGATIVE
Tetrahydrocannabinol: POSITIVE — AB

## 2024-08-25 LAB — CK: Total CK: 193 U/L (ref 38–234)

## 2024-08-25 LAB — ETHANOL: Alcohol, Ethyl (B): <15 mg/dL

## 2024-08-25 LAB — MAGNESIUM: Magnesium: 2.2 mg/dL (ref 1.7–2.4)

## 2024-08-25 LAB — HCG, SERUM, QUALITATIVE: Preg, Serum: NEGATIVE

## 2024-08-25 MED ORDER — KETOROLAC TROMETHAMINE 15 MG/ML IJ SOLN
15.0000 mg | Freq: Once | INTRAMUSCULAR | Status: AC
  Administered 2024-08-25: 15 mg via INTRAVENOUS
  Filled 2024-08-25: qty 1

## 2024-08-25 MED ORDER — ACETAMINOPHEN 500 MG PO TABS
1000.0000 mg | ORAL_TABLET | Freq: Once | ORAL | Status: AC
  Administered 2024-08-25: 1000 mg via ORAL
  Filled 2024-08-25: qty 2

## 2024-08-25 MED ORDER — LEVETIRACETAM (KEPPRA) 500 MG/5 ML ADULT IV PUSH
1500.0000 mg | Freq: Once | INTRAVENOUS | Status: AC
  Administered 2024-08-25: 1500 mg via INTRAVENOUS
  Filled 2024-08-25: qty 15

## 2024-08-25 MED ORDER — ONDANSETRON 4 MG PO TBDP
4.0000 mg | ORAL_TABLET | Freq: Once | ORAL | Status: AC
  Administered 2024-08-25: 4 mg via ORAL
  Filled 2024-08-25: qty 1

## 2024-08-25 MED ORDER — SODIUM CHLORIDE 0.9 % IV BOLUS
1000.0000 mL | Freq: Once | INTRAVENOUS | Status: AC
  Administered 2024-08-25: 500 mL via INTRAVENOUS

## 2024-08-25 MED ORDER — OXYCODONE HCL 5 MG PO TABS
5.0000 mg | ORAL_TABLET | Freq: Once | ORAL | Status: AC
  Administered 2024-08-25: 5 mg via ORAL
  Filled 2024-08-25: qty 1

## 2024-08-25 NOTE — ED Notes (Signed)
 Pt c/o pain in her head and her coccyx  requesting more pain med

## 2024-08-25 NOTE — Discharge Instructions (Addendum)
 Call and follow-up with your neurologist.  Stay on your Keppra  as prescribed.  You can use Tylenol  and Motrin  as needed for pain.  You are not allowed to drive for 6 months after your seizure.  You need to be cleared by neurology prior to driving.

## 2024-08-25 NOTE — ED Triage Notes (Signed)
 PT BIB [facility] from work d/t witnessed tonic clonic seizure at 1408 for approx 1 min, currently post ictal.  Hx seizure meds keppra   148/84 100 hr  99% ra [facility] 118

## 2024-08-25 NOTE — ED Notes (Signed)
 To x-ray

## 2024-08-25 NOTE — ED Provider Notes (Signed)
 " [place] [facility] Provider Note   CSN: [id number] Arrival date & time: 08/25/24  1451     Patient presents with: Seizures   Marie Myers is a 34 y.o. female.   34 year old female with history of seizures presents for evaluation of seizure.  States she had a tonic-clonic seizure at work today.  She is complaining of headache and tailbone pain.  States she thinks she fell and hit her tailbone.  She is on Keppra  states she has not missed any doses.  Does admit to marijuana use and daily wine use.  Denies any other symptoms or concerns.   Seizures      Prior to Admission medications  Medication Sig Start Date End Date Taking? Authorizing Provider  acetaminophen  (TYLENOL ) 325 MG tablet Take 2 tablets (650 mg total) by mouth every 6 (six) hours as needed for mild pain (pain score 1-3) or fever. 06/08/23  Yes Jadine Toribio SQUIBB, MD  buPROPion  (WELLBUTRIN  XL) 150 MG 24 hr tablet TAKE 1 TABLET BY MOUTH EVERY DAY IN THE MORNING 07/24/24  Yes Harl Regan E, NP  ibuprofen  (ADVIL ) 600 MG tablet Take 600 mg by mouth every 6 (six) hours as needed for mild pain (pain score 1-3). 06/03/24  Yes [provider]  levETIRAcetam  (KEPPRA ) 1000 MG tablet Take 1 tablet (1,000 mg total) by mouth 2 (two) times daily. 12/16/23  Yes Gregg Lek, MD  ondansetron  (ZOFRAN -ODT) 8 MG disintegrating tablet Take 1 tablet (8 mg total) by mouth every 8 (eight) hours as needed for nausea. 09/24/23  Yes Charlyn Sora, MD  promethazine  (PHENERGAN ) 25 MG suppository Place 1 suppository (25 mg total) rectally every 6 (six) hours as needed for nausea. 09/24/23  Yes Charlyn Sora, MD  QUEtiapine  (SEROQUEL ) 50 MG tablet TAKE 1 TABLET BY MOUTH EVERYDAY AT BEDTIME 07/24/24  Yes Parsons, Brittney E, NP  cholecalciferol  (CHOLECALCIFEROL ) 25 MCG tablet Take 1 tablet (1,000 Units total) by mouth daily. Patient not taking: Reported on 08/25/2024 07/01/23   Rai, Nydia POUR, MD   diazePAM , 20 MG Dose, (VALTOCO  20 MG DOSE) 2 x 10 MG/0.1ML LQPK Spray 1 vial into each nostril at onset on convulsion and call 911 Patient not taking: Reported on 08/25/2024 12/16/23   Camara, Amadou, MD  metoCLOPramide  (REGLAN ) 10 MG tablet Take 1 tablet (10 mg total) by mouth every 6 (six) hours as needed for nausea. Patient not taking: Reported on 08/25/2024 09/29/23   Freddi Hamilton, MD  mirtazapine  (REMERON ) 30 MG tablet Take 1 tablet (30 mg total) by mouth at bedtime. Patient not taking: Reported on 08/25/2024 12/29/23   Harl Regan BRAVO, NP  Pyridoxine HCl (B-6) 100 MG TABS Take 100 mg by mouth daily. Patient not taking: Reported on 08/25/2024 12/16/23   Camara, Amadou, MD    Allergies: Zithromax [azithromycin]    Review of Systems  Constitutional:  Negative for chills and fever.  HENT:  Negative for ear pain and sore throat.   Eyes:  Negative for pain and visual disturbance.  Respiratory:  Negative for cough and shortness of breath.   Cardiovascular:  Negative for chest pain and palpitations.  Gastrointestinal:  Negative for abdominal pain and vomiting.  Genitourinary:  Negative for dysuria and hematuria.  Musculoskeletal:  Negative for arthralgias and back pain.  Skin:  Negative for color change and rash.  Neurological:  Positive for seizures. Negative for syncope.  All other systems reviewed and are negative.   Updated Vital Signs BP (!) 146/87 (BP  Location: Right Arm)   Pulse 95   Temp 99 F (37.2 C) (Oral)   Resp 18   SpO2 99%   Physical Exam Vitals and nursing note reviewed.  Constitutional:      General: She is not in acute distress.    Appearance: Normal appearance. She is well-developed. She is not ill-appearing.  HENT:     Head: Normocephalic and atraumatic.  Eyes:     Conjunctiva/sclera: Conjunctivae normal.  Cardiovascular:     Rate and Rhythm: Normal rate and regular rhythm.     Heart sounds: No murmur heard. Pulmonary:     Effort: Pulmonary effort is  normal. No respiratory distress.     Breath sounds: Normal breath sounds.  Abdominal:     Palpations: Abdomen is soft.     Tenderness: There is no abdominal tenderness.  Musculoskeletal:        General: No swelling.     Cervical back: Neck supple.  Skin:    General: Skin is warm and dry.     Capillary Refill: Capillary refill takes less than 2 seconds.  Neurological:     General: No focal deficit present.     Mental Status: She is alert.  Psychiatric:        Mood and Affect: Mood normal.     (all labs ordered are listed, but only abnormal results are displayed) Labs Reviewed  CBC WITH DIFFERENTIAL/PLATELET - Abnormal; Notable for the following components:      Result Value   WBC 13.6 (*)    Neutro Abs 11.1 (*)    Monocytes Absolute 1.1 (*)    All other components within normal limits  URINE DRUG SCREEN - Abnormal; Notable for the following components:   Tetrahydrocannabinol POSITIVE (*)    Barbiturates POSITIVE (*)    All other components within normal limits  BASIC METABOLIC PANEL WITH GFR - Abnormal; Notable for the following components:   CO2 19 (*)    Calcium 8.1 (*)    All other components within normal limits  LEVETIRACETAM  LEVEL  HCG, SERUM, QUALITATIVE  ETHANOL  CK  MAGNESIUM     EKG: EKG Interpretation Date/Time:  Friday August 25 2024 15:58:44 EST Ventricular Rate:  95 PR Interval:  130 QRS Duration:  80 QT Interval:  350 QTC Calculation: 439 R Axis:   24  Text Interpretation: Normal sinus rhythm Cannot rule out Anterior infarct , age undetermined Compared with prior EKG from 09/29/2023 Confirmed by Gennaro Bouchard ([id number]) on 08/25/2024 4:22:37 PM  Radiology: CT Head Wo Contrast Result Date: 08/25/2024 EXAM: CT HEAD WITHOUT CONTRAST 08/25/2024 08:49:43 PM TECHNIQUE: CT of the head was performed without the administration of intravenous contrast. Automated exposure control, iterative reconstruction, and/or weight based adjustment of the mA/kV was utilized  to reduce the radiation dose to as low as reasonably achievable. COMPARISON: 10/03/2022 CLINICAL HISTORY: fall, hit head, seizure FINDINGS: BRAIN AND VENTRICLES: Unchanged Right parietal encephalomalacia. No acute hemorrhage. No evidence of acute infarct. No hydrocephalus. No extra-axial collection. No mass effect or midline shift. ORBITS: No acute abnormality. SINUSES: No acute abnormality. SOFT TISSUES AND SKULL: No acute soft tissue abnormality. No skull fracture. IMPRESSION: 1. No acute intracranial abnormality. 2. Unchanged right parietal encephalomalacia. Electronically signed by: Franky Stanford MD 08/25/2024 09:03 PM EST RP Workstation: [id number]   DG Sacrum/Coccyx Result Date: 08/25/2024 EXAM: XR Sacrum and Coccyx 2 view(s) 08/25/2024 07:08:00 PM COMPARISON: None available. CLINICAL HISTORY: Fall, tailbone pain FINDINGS: BONES AND JOINTS: No acute fracture. No malalignment. SOFT TISSUES:  The soft tissues are unremarkable. IMPRESSION: 1. No significant abnormality. Electronically signed by: Franky Stanford MD 08/25/2024 07:36 PM EST RP Workstation: [id number]     Procedures   Medications Ordered in the ED  levETIRAcetam  (KEPPRA ) undiluted injection 1,500 mg (1,500 mg Intravenous Given 08/25/24 1830)  ondansetron  (ZOFRAN -ODT) disintegrating tablet 4 mg (4 mg Oral Given 08/25/24 1609)  acetaminophen  (TYLENOL ) tablet 1,000 mg (1,000 mg Oral Given 08/25/24 1646)  sodium chloride  0.9 % bolus 1,000 mL (0 mLs Intravenous Stopped 08/25/24 2033)  ketorolac  (TORADOL ) 15 MG/ML injection 15 mg (15 mg Intravenous Given 08/25/24 1924)  oxyCODONE  (Oxy IR/ROXICODONE ) immediate release tablet 5 mg (5 mg Oral Given 08/25/24 2026)                                    Medical Decision Making Cardiac monitor interpretation: sinus rhythm, no ectopy  Social determinants of health: History of marijuana abuse  Patient here for seizure. No recurrence while in the ED. Given a load of keppra  as that is what she takes. Workup  negative. No further seizures in the ER. Given tylenol  and toradol  for pain. Does have hx of marijuana abuse and THC on UDS. Recommended discontinuing this due to her seizure history. Advised tylenol  and motrin  as needed for pain, continue her medications as prescribed and close follow up with neurology. Advised return to the ER for any new or worsening symptoms. Patient informed she is not allowed to drive for 6 months as she has had a seizure and she must be cleared by neurology. She is agreeable with plan for discharge. All results and plan discussed with patient and family at bedside.   Problems Addressed: Coccyx pain: acute illness or injury Nonintractable headache, unspecified chronicity pattern, unspecified headache type: acute illness or injury Seizure [facility]): acute illness or injury Seizure-like activity (HCC): acute illness or injury  Amount and/or Complexity of Data Reviewed External Data Reviewed: notes.    Details: Prior ED records reviewed and patient seen previously and admitted for marijuana hyperemesis Labs: ordered. Decision-making details documented in ED Course.    Details: Ordered and reviewed by me and unremarkable Radiology: ordered and independent interpretation performed. Decision-making details documented in ED Course.    Details: Ordered and interpreted by me independently of radiology:  Sacrum/coccyx xray: negative for acute abnormality CT head: no acute intracranial process ECG/medicine tests: ordered and independent interpretation performed. Decision-making details documented in ED Course.    Details: EKG ordered and interpreted by me in the absence of cardiology and shows sinus rhythm, no STEMI, and no significant changes when compared to prior   Risk OTC drugs. Prescription drug management. Drug therapy requiring intensive monitoring for toxicity. Diagnosis or treatment significantly limited by social determinants of health.     Final diagnoses:   Seizure-like activity (HCC)  Seizure (HCC)  Nonintractable headache, unspecified chronicity pattern, unspecified headache type  Coccyx pain    ED Discharge Orders     None          Gennaro Duwaine CROME, DO 08/27/24 1405  "

## 2024-08-25 NOTE — ED Provider Triage Note (Signed)
 Emergency Medicine Provider Triage Evaluation Note  Marie Myers , a 34 y.o. female  was evaluated in triage.  Pt complains of seizure.  History of seizure on Keppra , patient unsure when her last seizure was.  She reports she is compliant with her Keppra .  She follows with [facility] neurology.  Reports prior to seizure activity this morning she was feeling somewhat weak and had a poor appetite.  Fevers, chills, vomiting or dyspnea.  No sick contacts  Review of Systems  Positive: Seizure-like activity Negative: Fever  Physical Exam  BP 136/86 (BP Location: Right Arm)   Pulse 98   Temp 97.6 F (36.4 C) (Oral)   Resp 18   SpO2 100%  Gen:   Awake, no distress   Resp:  Normal effort  MSK:   Moves extremities without difficulty  Other:  She is neuro intact, appears postictal  Medical Decision Making  Medically screening exam initiated at 3:36 PM.  Appropriate orders placed.  Marie Myers was informed that the remainder of the evaluation will be completed by another provider, this initial triage assessment does not replace that evaluation, and the importance of remaining in the ED until their evaluation is complete.     Marie Jayson LABOR, DO 08/25/24 1537

## 2024-08-27 LAB — LEVETIRACETAM LEVEL: Levetiracetam Lvl: 12.5 ug/mL (ref 10.0–40.0)

## 2024-09-06 ENCOUNTER — Telehealth: Payer: Self-pay | Admitting: Neurology

## 2024-09-06 ENCOUNTER — Telehealth (INDEPENDENT_AMBULATORY_CARE_PROVIDER_SITE_OTHER): Admitting: Psychiatry

## 2024-09-06 ENCOUNTER — Encounter (HOSPITAL_COMMUNITY): Payer: Self-pay | Admitting: Psychiatry

## 2024-09-06 ENCOUNTER — Encounter (HOSPITAL_COMMUNITY): Payer: Self-pay

## 2024-09-06 DIAGNOSIS — F1020 Alcohol dependence, uncomplicated: Secondary | ICD-10-CM | POA: Diagnosis not present

## 2024-09-06 DIAGNOSIS — F411 Generalized anxiety disorder: Secondary | ICD-10-CM

## 2024-09-06 DIAGNOSIS — F1290 Cannabis use, unspecified, uncomplicated: Secondary | ICD-10-CM

## 2024-09-06 MED ORDER — NALTREXONE HCL 50 MG PO TABS: 50.0000 mg | ORAL_TABLET | Freq: Every day | ORAL | 3 refills | Status: AC

## 2024-09-06 MED ORDER — BUSPIRONE HCL 10 MG PO TABS: 10.0000 mg | ORAL_TABLET | Freq: Three times a day (TID) | ORAL | 3 refills | Status: DC

## 2024-09-06 NOTE — Progress Notes (Signed)
 [facility] MD/PA/NP OP Progress Note Virtual Visit via Video Note  I connected with Marie Myers on 09/06/2024 at  3:30 PM EST by a video enabled telemedicine application and verified that I am speaking with the correct person using two identifiers.  Location: Patient: Work Provider: Clinic   I discussed the limitations of evaluation and management by telemedicine and the availability of in person appointments. The patient expressed understanding and agreed to proceed.  I provided 30 minutes of non-face-to-face time during this encounter.   09/06/2024 1:48 PM Marie Myers  MRN:  [id number]  Chief Complaint: Work is putting on a lot of stress and I have post partum  HPI: 34 year old female seen today for follow-up psychiatric evaluation.  She has a psychiatric history of depression, alcohol use disorder, anxiety, insomnia, and marijuana use.  Currently she is managed on Seroquel  and Wellbutrin  150 mg.  Patient also takes Keppra  100 mg twice daily(prescribed by her neurologist). She notes she that her medications are ineffective in managing her psychiatric conditions.    Today she is well-groomed, pleasant, cooperative, and engaged in conversation.  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  Patient notes that she does not feel that she is not enough.  She notes that she feels that she is a bad parent.  She inform her that her 76-month-year-old is doing well.  She has some support from her sister but notes that she and her mother has family conflict.  Recently she informed writer that she had a seizure at work while under an immense stress.  Patient was seen in the ED on 08/25/2024 for seizure-like activity.  Patient does note that she has been compliant with her anticonvulsant medications.  Patient instructed to follow-up with neurology.   Provider asked patient if she was using illegal substances.  She notes that she smokes  marijuana daily.  She also informed clinical research associate that she has been drinking alcohol more.  She reports that she would like to cut back on her consumption but notes that she finds that she is able to function more when she drinks.  Today provider conducted an audit assessment and patient scored a 20.  Provider informed patient that her substance use can be exacerbating her mental health.  She endorsed understanding and notes that she will try to cut back.  Provider recommended naltrexone  which patient was agreeable to.   Since her last visit she notes that her anxiety depression has worsened.  Today provider conducted GAD-7 of a score of 14, at her last visit she scored a 16.  Provider conducted PHQ-9 and patient scored a 21, at her last visit she scored a 20.    Patient has tried Seroquel , mirtazapine , Abilify , gabapentin  without success.  Provider informed patient that her mental health declined is more likely due to substances.  Provider recommended SAIOP which she was agreeable to.  Patient also agreeable to starting naltrexone  50 mg to help manage alcohol dependence.  She will start BuSpar  10 mg 3 times daily to help manage anxiety and depression.  Wellbutrin  discontinued as it can lower the patient's seizure threshold.  Potential side effects of medication and risks vs benefits of treatment vs non-treatment were explained and discussed. All questions were answered. No other concerns noted at this time.   Visit Diagnosis:    ICD-10-CM   1. Alcohol use disorder, severe, dependence (HCC)  F10.20 naltrexone  (DEPADE) 50 MG tablet  2. Marijuana use  F12.90     3. Generalized anxiety disorder  F41.1 busPIRone  (BUSPAR ) 10 MG tablet           Past Psychiatric History:  depression, alcohol use disorder, anxiety, insomnia, and marijuana use  Past Medical History:  Past Medical History:  Diagnosis Date   Anxiety    Depression    H/O gastric bypass    Insomnia    Marijuana use 01/08/2021    Moderate alcohol use disorder (HCC) 01/08/2021    Past Surgical History:  Procedure Laterality Date   CHOLECYSTECTOMY     ESOPHAGOGASTRODUODENOSCOPY (EGD) WITH PROPOFOL  N/A 06/29/2023   Procedure: ESOPHAGOGASTRODUODENOSCOPY (EGD) WITH PROPOFOL ;  Surgeon: Rosalie Kitchens, MD;  Location: [facility];  Service: Gastroenterology;  Laterality: N/A;  Possible balloon dilation   GASTRIC BYPASS     GASTRIC BYPASS     LAPAROSCOPY N/A 08/11/2019   Procedure: LAPAROSCOPY DIAGNOSTIC;  Surgeon: Vanderbilt Ned, MD;  Location: [facility] OR;  Service: General;  Laterality: N/A;    Family Psychiatric History: Son SI and ADHD  Family History:  Family History  Problem Relation Age of Onset   Diabetes Mother    Diabetes Father    Seizures Neg Hx     Social History:  Social History   Socioeconomic History   Marital status: Single    Spouse name: Not on file   Number of children: Not on file   Years of education: Not on file   Highest education level: Not on file  Occupational History   Not on file  Tobacco Use   Smoking status: Never   Smokeless tobacco: Never  Vaping Use   Vaping status: Every Day   Substances: Nicotine , Flavoring  Substance and Sexual Activity   Alcohol use: Yes    Alcohol/week: 1.0 standard drink of alcohol    Types: 1 Glasses of wine per week   Drug use: Yes    Types: Marijuana   Sexual activity: Not on file  Other Topics Concern   Not on file  Social History Narrative   Not on file   Social Drivers of Health   Tobacco Use: Low Risk (07/24/2024)   Received from [facility]   Patient History    Passive Exposure: Not on file    Smokeless Tobacco Use: Never    Smoking Tobacco Use: Never  Financial Resource Strain: Low Risk (11/30/2023)   Received from [facility]   Overall Financial Resource Strain (CARDIA)    Difficulty of Paying Living Expenses: Not hard at all  Food Insecurity: No Food Insecurity (05/07/2024)   Received from [facility]   Epic    Within the  past 12 months, you worried that your food would run out before you got the money to buy more.: Never true    Within the past 12 months, the food you bought just didn't last and you didn't have money to get more.: Never true  Transportation Needs: No Transportation Needs (05/07/2024)   Received from [facility]    In the past 12 months, has lack of transportation kept you from medical appointments or from getting medications?: No    In the past 12 months, has lack of transportation kept you from meetings, work, or from getting things needed for daily living?: No  Physical Activity: Not on file  Stress: No Stress Concern Present (05/07/2024)   Received from [facility] - Occupational Stress Questionnaire  Do you feel stress - tense, restless, nervous, or anxious, or unable to sleep at night because your mind is troubled all the time - these days?: Only a little  Social Connections: Unknown (10/02/2022)   Received from [facility]   Social Network    Social Network: Not on file  Depression (PHQ2-9): High Risk (09/06/2024)   Depression (PHQ2-9)    PHQ-2 Score: 21  Alcohol Screen: High Risk (09/06/2024)   Alcohol Screen    Last Alcohol Screening Score (AUDIT): 20  Housing: Low Risk (05/07/2024)   Received from [facility]    In the last 12 months, was there a time when you were not able to pay the mortgage or rent on time?: No    In the past 12 months, how many times have you moved where you were living?: 1    At any time in the past 12 months, were you homeless or living in a shelter (including now)?: No  Utilities: Not At Risk (05/07/2024)   Received from [facility]    In the past 12 months has the electric, gas, oil, or water  company threatened to shut off services in your home?: No  Health Literacy: Not on file    Allergies:  Allergies  Allergen Reactions   Zithromax [Azithromycin] Hives    Metabolic  Disorder Labs: Lab Results  Component Value Date   HGBA1C 4.5 (L) 01/12/2022   MPG 82.45 01/12/2022   MPG 88.19 01/08/2021   No results found for: PROLACTIN Lab Results  Component Value Date   CHOL 177 01/12/2022   TRIG 42 01/12/2022   HDL 119 01/12/2022   CHOLHDL 1.5 01/12/2022   VLDL 8 01/12/2022   LDLCALC 50 01/12/2022   LDLCALC 38 01/08/2021   Lab Results  Component Value Date   TSH 2.288 06/07/2023   TSH 2.338 01/12/2022    Therapeutic Level Labs: No results found for: LITHIUM No results found for: VALPROATE No results found for: CBMZ  Current Medications: Current Outpatient Medications  Medication Sig Dispense Refill   busPIRone  (BUSPAR ) 10 MG tablet Take 1 tablet (10 mg total) by mouth 3 (three) times daily. 90 tablet 3   naltrexone  (DEPADE) 50 MG tablet Take 1 tablet (50 mg total) by mouth daily. 30 tablet 3   acetaminophen  (TYLENOL ) 325 MG tablet Take 2 tablets (650 mg total) by mouth every 6 (six) hours as needed for mild pain (pain score 1-3) or fever.     cholecalciferol  (CHOLECALCIFEROL ) 25 MCG tablet Take 1 tablet (1,000 Units total) by mouth daily. (Patient not taking: Reported on 08/25/2024) 30 tablet 3   diazePAM , 20 MG Dose, (VALTOCO  20 MG DOSE) 2 x 10 MG/0.1ML LQPK Spray 1 vial into each nostril at onset on convulsion and call 911 (Patient not taking: Reported on 08/25/2024) 2 each 2   ibuprofen  (ADVIL ) 600 MG tablet Take 600 mg by mouth every 6 (six) hours as needed for mild pain (pain score 1-3).     levETIRAcetam  (KEPPRA ) 1000 MG tablet Take 1 tablet (1,000 mg total) by mouth 2 (two) times daily. 90 tablet 3   metoCLOPramide  (REGLAN ) 10 MG tablet Take 1 tablet (10 mg total) by mouth every 6 (six) hours as needed for nausea. (Patient not taking: Reported on 08/25/2024) 30 tablet 0   ondansetron  (ZOFRAN -ODT) 8 MG disintegrating tablet Take 1 tablet (8 mg total) by mouth every 8 (eight) hours as needed for nausea. 20 tablet 0   promethazine  (  PHENERGAN ) 25  MG suppository Place 1 suppository (25 mg total) rectally every 6 (six) hours as needed for nausea. 12 each 0   Pyridoxine HCl (B-6) 100 MG TABS Take 100 mg by mouth daily. (Patient not taking: Reported on 08/25/2024) 90 tablet 0   No current facility-administered medications for this visit.     Musculoskeletal: Strength & Muscle Tone: within normal limits and Telehealth visit Gait & Station: normal, Telehealth visit Patient leans: N/A  Psychiatric Specialty Exam: Review of Systems  There were no vitals taken for this visit.There is no height or weight on file to calculate BMI.  General Appearance: Well Groomed  Eye Contact:  Good  Speech:  Clear and Coherent and Normal Rate  Volume:  Normal  Mood:  Anxious and Depressed  Affect:  Appropriate  Thought Process:  Coherent, Goal Directed, and Linear  Orientation:  Full (Time, Place, and Person)  Thought Content: WDL and Logical   Suicidal Thoughts:  No  Homicidal Thoughts:  No  Memory:  Immediate;   Good Recent;   Good Remote;   Good  Judgement:  Fair  Insight:  Fair  Psychomotor Activity:  Normal  Concentration:  Concentration: Good and Attention Span: Good  Recall:  Good  Fund of Knowledge: Good  Language: Good  Akathisia:  No  Handed:  Right  AIMS (if indicated): not done  Assets:  Communication Skills Desire for Improvement Housing Leisure Time Physical Health Social Support Talents/Skills Vocational/Educational  ADL's:  Intact  Cognition: WNL  Sleep:  Poor   Screenings: AIMS    Flowsheet Row [facility]  AIMS Total Score 0 0   AUDIT    Flowsheet Row Video Visit from 09/06/2024 in [facility] Video Visit from 10/08/2023 in [facility] Clinical Support from 06/01/2023 in [facility] Video Visit from 05/28/2022 in [facility] Office Visit from 02/20/2022 in [facility]  Alcohol Use Disorder Identification Test Final Score (AUDIT) 20 18 26 18 17    GAD-7    Flowsheet Row Video Visit from 09/06/2024 in [facility] Video Visit from 06/29/2024 in [facility] Video Visit from 12/09/2023 in [facility] Video Visit from 10/08/2023 in [facility] Counselor from 06/22/2023 in [facility]  Total GAD-7 Score 14 16 8 18 17    PHQ2-9    Flowsheet Row Video Visit from 09/06/2024 in [facility] Video Visit from 06/29/2024 in [facility] Video Visit from 12/09/2023 in [facility] Video Visit from 10/08/2023 in [facility] Counselor from 06/22/2023 in [facility]  PHQ-2 Total Score 6 5 1 6 6   PHQ-9 Total Score 21 20 6 21 25    Flowsheet Row Video Visit from 06/29/2024 in [facility] Video Visit from 10/08/2023 in [facility] ED from 09/29/2023 in [facility] Emergency Department at [facility]  C-SSRS RISK CATEGORY Error: Q3, 4, or 5 should not be populated when Q2 is No Error: Q7 should not be populated when Q6 is No Moderate Risk     Assessment and Plan: Patient notes that her sleep, anxiety, and depression continues to be problematic. She continues  to smoke marijuana and drink alcohol to cope. Patient has tried Seroquel , mirtazapine , Abilify , gabapentin  without success.  Provider informed patient that her mental health declined is more likely due to substances.  Provider recommended SAIOP which she was agreeable to.  Patient also agreeable to starting naltrexone  50 mg to help manage  alcohol dependence.  She will start BuSpar  10 mg 3 times daily to help manage anxiety and depression.  Wellbutrin  discontinued as it can lower the patient's seizure threshold.  1. Alcohol use disorder, severe, dependence (HCC) (Primary)  Start- naltrexone  (DEPADE) 50 MG tablet; Take 1 tablet (50 mg total) by mouth daily.  Dispense: 30 tablet; Refill: 3 - Ambulatory referral to Social Work  2. Marijuana use  - Ambulatory referral to Social Work  3. Generalized anxiety disorder  Start- busPIRone  (BUSPAR ) 10 MG tablet; Take 1 tablet (10 mg total) by mouth 3 (three) times daily.  Dispense: 90 tablet; Refill: 3     Consent: Patient/Guardian gives verbal consent for treatment and assignment of benefits for services provided during this visit. Patient/Guardian expressed understanding and agreed to proceed.   Follow up in 2.5 months Follow-up with therapy Zane FORBES Bach, NP 09/06/2024, 1:48 PM

## 2024-09-06 NOTE — Telephone Encounter (Signed)
 ..  Pt understands that although there may be some limitations with this type of visit, we will take all precautions to reduce any security or privacy concerns.  Pt understands that this will be treated like an in office visit and we will file with pt's insurance, and there may be a patient responsible charge related to this service. ? ?

## 2024-09-08 ENCOUNTER — Telehealth (INDEPENDENT_AMBULATORY_CARE_PROVIDER_SITE_OTHER): Admitting: Family Medicine

## 2024-09-08 ENCOUNTER — Encounter: Payer: Self-pay | Admitting: Family Medicine

## 2024-09-08 DIAGNOSIS — F419 Anxiety disorder, unspecified: Secondary | ICD-10-CM | POA: Diagnosis not present

## 2024-09-08 DIAGNOSIS — R569 Unspecified convulsions: Secondary | ICD-10-CM | POA: Diagnosis not present

## 2024-09-08 DIAGNOSIS — F1090 Alcohol use, unspecified, uncomplicated: Secondary | ICD-10-CM

## 2024-09-08 DIAGNOSIS — Z5181 Encounter for therapeutic drug level monitoring: Secondary | ICD-10-CM

## 2024-09-08 NOTE — Patient Instructions (Signed)
 Below is our plan:  We will continue levetiracetam  1000mg  twice daily for now. Please come by the office next week to update your labs. Avoid bupropion . Continue close follow up with your behavioral health providers.   Please make sure you are consistent with timing of seizure medication. I recommend annual visit with primary care provider (PCP) for complete physical and routine blood work. I recommend daily intake of vitamin D  (400-800iu) and calcium (800-1000mg ) for bone health. Discuss Dexa screening with PCP.   According to [place] law, you can not drive unless you are seizure / syncope free for at least 6 months and under physician's care. You will not be able to drive until seizure free through 02/22/2025.   Please maintain precautions. Do not participate in activities where a loss of awareness could harm you or someone else. No swimming alone, no tub bathing, no hot tubs, no driving, no operating motorized vehicles (cars, ATVs, motocycles, etc), lawnmowers, power tools or firearms. No standing at heights, such as rooftops, ladders or stairs. Avoid hot objects such as stoves, heaters, open fires. Wear a helmet when riding a bicycle, scooter, skateboard, etc. and avoid areas of traffic. Set your water  heater to 120 degrees or less.  SUDEP is the sudden, unexpected death of someone with epilepsy, who was otherwise healthy. In SUDEP cases, no other cause of death is found when an autopsy is done. Each year, more than 1 in 1,000 people with epilepsy die from SUDEP. This is the leading cause of death in people with uncontrolled seizures. Until further answers are available, the best way to prevent SUDEP is to lower your risk by controlling seizures. Research has found that people with all types of epilepsy that experience convulsive seizures can be at risk.  Please make sure you are staying well hydrated. I recommend 50-60 ounces daily. Well balanced diet and regular exercise encouraged. Consistent sleep  schedule with 6-8 hours recommended.   Please continue follow up with care team as directed.   Follow up with Dr Gregg in 2-3 months   You may receive a survey regarding today's visit. I encourage you to leave honest feed back as I do use this information to improve patient care. Thank you for seeing me today!

## 2024-09-08 NOTE — Progress Notes (Signed)
 "  PATIENT: Marie Myers DOB: September 18, 1990  REASON FOR VISIT: follow up HISTORY FROM: patient  Virtual Visit via MyChart video  I connected with Marie Myers on 09/08/24 at  9:45 AM EST via MyChart video and verified that I am speaking with the correct person using two identifiers.   I discussed the limitations, risks, security and privacy concerns of performing an evaluation and management service by Mychart video and the availability of in person appointments. I also discussed with the patient that there may be a patient responsible charge related to this service. The patient expressed understanding and agreed to proceed.   History of Present Illness:  09/08/24 ALL (MyChart): Marie Myers is a 34 y.o. female here today for hospital follow up s/p seizure 08/25/2024. She was seen by the ER and reported a tonic clonic seizure occurring at work. She denied missed ASM. Lev level 12.5 She had smoked marijuana and drinks wine, every day. She was given IV load of levetiracetam .   She was last seen by Dr Gregg. She had been switched to oxcarbazepine  08/2023 in setting of breakthrough seizure, however, learned that she was pregnant with breakthrough seizure 11/2023. Dr Gregg switched her back to lev starting 500mg  BID and increasing to 1000mg  BID. Monthly labs advised and follow up scheduled 01/2024. She did not update labs and missed follow up appt. She cancelled follow up 02/2024.   Since, she reports doing well. No further seizure activity. She feels stress was trigger for event. 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 She was overwhelmed to day of her last event and had a seizure. She denies missed doses of ASM. She does use marijuana and drinks alcohol daily. She is working with behavioral health to help control anxiety and decrease alcohol intake. She recently stopped bupropion  following last seizure. She was started on buspirone . She delivered  a healthy baby girl 05/2024. She also has a 7 year old. She lives with her husband and children.   History (copied from Dr Janean previous note)  INTERVAL HISTORY 12/16/2023:  Patient presents today for follow-up, last visit was in January.  At that time we have decided to switch her from Keppra  to oxcarbazepine  due to irritability and anger.  She tells me she was taking the medication as directed but on March 16 did have a breakthrough seizure at home.  Husband tells me the patient was laying in bed and then had a generalized convulsion requiring him to call EMS.  In the ED patient was found to be pregnant, she was not aware of her pregnancy.  Since then, she tells me she continues take the medication, has discontinued alcohol and is following with OB/GYN.  Has not had any additional seizures.     INTERVAL HISTORY 09/07/2023:  Patient presents today for follow-up, she is accompanied by her husband.  Last visit was in January 2024.  She tells me since then,  she continued to have seizures.  Back in February, she presented to the ED after a seizure.  At discharge her gabapentin  was switched to Keppra .  She has been taking Keppra  as recommended twice daily but continued to have seizures.  She tells me with the Keppra  she does have increased irritability and anger.  Currently she is undergoing a lot of stress, tells me she is not in a good space at the moment. She reports that her last seizure was 3 nights ago and prior to that was  2 weeks ago.  The seizure 3 nights ago was the first time that she had out of sleep.  The one 2 weeks ago was after an argument with her mother.  Husband tells me that some of her seizures are stress-induced. Denies any major injuries or urinary incontinence with the seizures, only tongue biting.  Patient tells me that she continues to drink, usually drink about 3 glasses of wine nightly.  She does not drink beer or alcohol, wine only. She does have a psychiatrist but states  that currently she is under a lot of stress but she is willing to commit herself to a mental health facility and also to rehab for alcohol cessation.        HISTORY OF PRESENT ILLNESS:  This is a 34 year old woman past medical history including hypertension, anxiety, depression, who is presenting after being admitted to the hospital for seizure.  Patient reports on December 30, she did have a total of 3 seizures described as generalized convulsion.  Seizure associated with tongue biting and urinary incontinence.  She presented to the ED, had a MRI and EEG which did not show any abnormality.  Her EEG was possibly related to alcohol withdrawal as she does report drinking and her last drink was 4 days prior to presentation. She was previously on Gabapentin  300 mg TID and plan was for patient to increase it to 600 mg TID.  On today's exam, patient reported she remembers going to work and the next thing that she remembered is waking up in the back of the ambulance.  Still her hospitalization is not clear to her.  Since leaving the hospital she has not had any additional seizures but she continues to drink alcohol daily, she reported her last drink was 2 days ago.  She has not been taking her Gabapentin  as directed because per patient, it is not helpful. She does drink hard liquor and also wine.  She reports that she drinks alcohol to help her with sleep.  She does complain of insomnia.  She does not regularly see a psychiatrist or therapist but go to the [facility] to get her meds refilled.     Handedness: Right handed    Onset: August 22 2022   Seizure Type: Generalized convulsion    Current frequency: Only once    Any injuries from seizures: Tongue biting    Seizure risk factors: Alcohol use disorder otherwise no other reported    Previous ASMs: Gabapentin  but for anxiety and mental illness, Levetiracetam , Oxcarbazepine     Currenty ASMs: Oxcarbazepine  300 mg twice daily    ASMs  side effects: Denies    Brain Images: Normal    Previous EEGs: Normal      OTHER MEDICAL CONDITIONS: Anxiety/Depression, Hypertension, Alcohol use disorder    Observations/Objective:  Generalized: Well developed, in no acute distress  Mentation: Alert oriented to time, place, history taking. Follows all commands speech and language fluent   Assessment and Plan:  34 y.o. year old female  has a past medical history of Anxiety, Depression, H/O gastric bypass, Insomnia, Marijuana use (01/08/2021), and Moderate alcohol use disorder (HCC) (01/08/2021). here with    ICD-10-CM   1. Seizures (HCC)  R56.9 Levetiracetam  level    2. Therapeutic drug monitoring  Z51.81 Levetiracetam  level    3. Alcohol use  F10.90     4. Anxiety  F41.9      Marie Myers reports last seizure 08/25/2024 while working. She feels stress was trigger. She was  also taking bupropion  and has recently transitioned to buspirone . No seizure activity since. We will check labs. She was advised to come in Monday 09/11/2024 for lab draw. May consider increasing lev to 1500mg  BID pending results. I have reiterated driving restrictions until seizure free for at least 6 months. I have encouraged her to continue working with behavioral health for anxiety and depression management as well as assistance with alcohol cessation. Healthy lifestyle habits encouraged. I will have her follow up with Dr Gregg in 2-3 months.   Orders Placed This Encounter  Procedures   Levetiracetam  level    Standing Status:   Future    Expected Date:   09/15/2024    Expiration Date:   10/09/2024    No orders of the defined types were placed in this encounter.    Follow Up Instructions:  I discussed the assessment and treatment plan with the patient. The patient was provided an opportunity to ask questions and all were answered. The patient agreed with the plan and demonstrated an understanding of the instructions.   The patient was advised to call back  or seek an in-person evaluation if the symptoms worsen or if the condition fails to improve as anticipated.  I provided 30 minutes of face-to-face and non face-to-face time during this MyChart video encounter. Patient located at their place of residence. Provider is in the office.    Marie Liebert, NP  "

## 2024-09-27 ENCOUNTER — Encounter (HOSPITAL_COMMUNITY): Payer: Self-pay

## 2024-09-27 ENCOUNTER — Emergency Department (HOSPITAL_COMMUNITY)
Admission: EM | Admit: 2024-09-27 | Discharge: 2024-09-27 | Discharge status: Against Medical Advice | Attending: Emergency Medicine | Admitting: Emergency Medicine

## 2024-09-27 ENCOUNTER — Other Ambulatory Visit: Payer: Self-pay

## 2024-09-27 DIAGNOSIS — R1110 Vomiting, unspecified: Secondary | ICD-10-CM | POA: Insufficient documentation

## 2024-09-27 DIAGNOSIS — Z5321 Procedure and treatment not carried out due to patient leaving prior to being seen by health care provider: Secondary | ICD-10-CM | POA: Insufficient documentation

## 2024-09-27 NOTE — ED Triage Notes (Signed)
 Pt BIB [facility] from home d/t her husband reporting that when she woke she was vomiting & called 911 saying she was unresponsive. EMS reports she appeared more somnolent, by staring ahead, did have spontaneous eye movement, she was clenching her hands at one point & has been able to transfer to & from their stretcher. Nausea & dry heaves while en route. Husband reports she does have Hx of ETOH abuse & drank half a bottle of wine last night. Has been treated for post partum depression. EMS reports during her somnolent period she had no v/s changes & continued to have spontaneous eye movement. No seizure-like activity. 162/116, 80 bpm, CBG 92, 96% on RA, 12L unremarkable.

## 2024-09-27 NOTE — ED Notes (Signed)
 Called for roll call by sort RN no answer

## 2024-09-29 ENCOUNTER — Other Ambulatory Visit (HOSPITAL_COMMUNITY): Payer: Self-pay | Admitting: Psychiatry

## 2024-09-29 DIAGNOSIS — F411 Generalized anxiety disorder: Secondary | ICD-10-CM

## 2024-10-24 ENCOUNTER — Ambulatory Visit (HOSPITAL_COMMUNITY)

## 2024-11-01 ENCOUNTER — Telehealth (HOSPITAL_COMMUNITY): Admitting: Psychiatry
